# Patient Record
Sex: Male | Born: 1970 | Race: Black or African American | Hispanic: No | Marital: Single | Smoking: Former smoker
Health system: Southern US, Community
[De-identification: ages and names within clinical notes are randomized; demographics above are authoritative.]

## PROBLEM LIST (undated history)

## (undated) ENCOUNTER — Ambulatory Visit (HOSPITAL_COMMUNITY): Admission: EM | Source: Home / Self Care

## (undated) DIAGNOSIS — R7303 Prediabetes: Secondary | ICD-10-CM

## (undated) DIAGNOSIS — E7800 Pure hypercholesterolemia, unspecified: Secondary | ICD-10-CM

## (undated) DIAGNOSIS — I10 Essential (primary) hypertension: Secondary | ICD-10-CM

## (undated) DIAGNOSIS — E663 Overweight: Secondary | ICD-10-CM

## (undated) DIAGNOSIS — R0602 Shortness of breath: Secondary | ICD-10-CM

## (undated) DIAGNOSIS — F419 Anxiety disorder, unspecified: Secondary | ICD-10-CM

## (undated) DIAGNOSIS — M1990 Unspecified osteoarthritis, unspecified site: Secondary | ICD-10-CM

## (undated) DIAGNOSIS — D696 Thrombocytopenia, unspecified: Principal | ICD-10-CM

## (undated) DIAGNOSIS — G4730 Sleep apnea, unspecified: Secondary | ICD-10-CM

## (undated) DIAGNOSIS — Z87442 Personal history of urinary calculi: Secondary | ICD-10-CM

## (undated) DIAGNOSIS — Z9889 Other specified postprocedural states: Secondary | ICD-10-CM

## (undated) DIAGNOSIS — F32A Depression, unspecified: Secondary | ICD-10-CM

## (undated) DIAGNOSIS — R079 Chest pain, unspecified: Secondary | ICD-10-CM

## (undated) DIAGNOSIS — M5020 Other cervical disc displacement, unspecified cervical region: Secondary | ICD-10-CM

## (undated) DIAGNOSIS — R112 Nausea with vomiting, unspecified: Secondary | ICD-10-CM

## (undated) DIAGNOSIS — M549 Dorsalgia, unspecified: Secondary | ICD-10-CM

## (undated) HISTORY — DX: Other cervical disc displacement, unspecified cervical region: M50.20

## (undated) HISTORY — PX: KNEE ARTHROSCOPY: SUR90

## (undated) HISTORY — DX: Dorsalgia, unspecified: M54.9

## (undated) HISTORY — PX: ADENOIDECTOMY: SUR15

## (undated) HISTORY — PX: ANTERIOR FUSION CERVICAL SPINE: SUR626

## (undated) HISTORY — PX: HERNIA REPAIR: SHX51

## (undated) HISTORY — DX: Thrombocytopenia, unspecified: D69.6

## (undated) HISTORY — PX: APPENDECTOMY: SHX54

## (undated) HISTORY — DX: Chest pain, unspecified: R07.9

## (undated) HISTORY — DX: Anxiety disorder, unspecified: F41.9

## (undated) HISTORY — DX: Overweight: E66.3

## (undated) HISTORY — DX: Depression, unspecified: F32.A

## (undated) HISTORY — DX: Unspecified osteoarthritis, unspecified site: M19.90

## (undated) HISTORY — DX: Shortness of breath: R06.02

## (undated) HISTORY — PX: POSTERIOR FUSION CERVICAL SPINE: SUR628

## (undated) HISTORY — PX: IRRIGATION AND DEBRIDEMENT SEBACEOUS CYST: SHX5255

## (undated) HISTORY — PX: TONSILLECTOMY: SUR1361

## (undated) HISTORY — PX: LUMBAR FUSION: SHX111

## (undated) HISTORY — DX: Pure hypercholesterolemia, unspecified: E78.00

---

## 1998-04-05 ENCOUNTER — Emergency Department (HOSPITAL_COMMUNITY): Admission: EM | Admit: 1998-04-05 | Discharge: 1998-04-05 | Payer: Self-pay | Admitting: Emergency Medicine

## 1998-04-08 ENCOUNTER — Emergency Department (HOSPITAL_COMMUNITY): Admission: EM | Admit: 1998-04-08 | Discharge: 1998-04-08 | Payer: Self-pay | Admitting: Emergency Medicine

## 1999-06-09 ENCOUNTER — Emergency Department (HOSPITAL_COMMUNITY): Admission: EM | Admit: 1999-06-09 | Discharge: 1999-06-09 | Payer: Self-pay | Admitting: Emergency Medicine

## 1999-06-09 ENCOUNTER — Encounter: Payer: Self-pay | Admitting: Emergency Medicine

## 2000-06-28 ENCOUNTER — Emergency Department (HOSPITAL_COMMUNITY): Admission: EM | Admit: 2000-06-28 | Discharge: 2000-06-28 | Payer: Self-pay | Admitting: *Deleted

## 2003-03-31 ENCOUNTER — Emergency Department (HOSPITAL_COMMUNITY): Admission: EM | Admit: 2003-03-31 | Discharge: 2003-03-31 | Payer: Self-pay | Admitting: Emergency Medicine

## 2003-05-05 ENCOUNTER — Emergency Department (HOSPITAL_COMMUNITY): Admission: EM | Admit: 2003-05-05 | Discharge: 2003-05-05 | Payer: Self-pay | Admitting: Emergency Medicine

## 2003-08-10 ENCOUNTER — Emergency Department (HOSPITAL_COMMUNITY): Admission: AD | Admit: 2003-08-10 | Discharge: 2003-08-10 | Payer: Self-pay | Admitting: Family Medicine

## 2004-06-07 ENCOUNTER — Emergency Department (HOSPITAL_COMMUNITY): Admission: EM | Admit: 2004-06-07 | Discharge: 2004-06-07 | Payer: Self-pay | Admitting: Family Medicine

## 2004-06-14 ENCOUNTER — Emergency Department (HOSPITAL_COMMUNITY): Admission: EM | Admit: 2004-06-14 | Discharge: 2004-06-14 | Payer: Self-pay | Admitting: Family Medicine

## 2004-06-14 ENCOUNTER — Ambulatory Visit (HOSPITAL_COMMUNITY): Admission: RE | Admit: 2004-06-14 | Discharge: 2004-06-14 | Payer: Self-pay | Admitting: Family Medicine

## 2004-08-02 ENCOUNTER — Encounter (INDEPENDENT_AMBULATORY_CARE_PROVIDER_SITE_OTHER): Payer: Self-pay | Admitting: *Deleted

## 2004-08-02 ENCOUNTER — Ambulatory Visit (HOSPITAL_COMMUNITY): Admission: RE | Admit: 2004-08-02 | Discharge: 2004-08-02 | Payer: Self-pay | Admitting: Neurosurgery

## 2004-09-25 ENCOUNTER — Encounter: Admission: RE | Admit: 2004-09-25 | Discharge: 2004-09-25 | Payer: Self-pay | Admitting: Neurosurgery

## 2004-10-29 ENCOUNTER — Inpatient Hospital Stay (HOSPITAL_COMMUNITY): Admission: RE | Admit: 2004-10-29 | Discharge: 2004-11-01 | Payer: Self-pay | Admitting: Neurosurgery

## 2005-04-11 ENCOUNTER — Encounter: Admission: RE | Admit: 2005-04-11 | Discharge: 2005-04-11 | Payer: Self-pay | Admitting: Neurosurgery

## 2005-08-14 ENCOUNTER — Encounter: Admission: RE | Admit: 2005-08-14 | Discharge: 2005-08-14 | Payer: Self-pay | Admitting: Neurosurgery

## 2005-10-14 ENCOUNTER — Inpatient Hospital Stay (HOSPITAL_COMMUNITY): Admission: RE | Admit: 2005-10-14 | Discharge: 2005-10-19 | Payer: Self-pay | Admitting: Neurosurgery

## 2005-12-30 ENCOUNTER — Emergency Department (HOSPITAL_COMMUNITY): Admission: EM | Admit: 2005-12-30 | Discharge: 2005-12-30 | Payer: Self-pay | Admitting: Emergency Medicine

## 2006-02-04 ENCOUNTER — Ambulatory Visit (HOSPITAL_BASED_OUTPATIENT_CLINIC_OR_DEPARTMENT_OTHER): Admission: RE | Admit: 2006-02-04 | Discharge: 2006-02-04 | Payer: Self-pay | Admitting: Orthopedic Surgery

## 2006-11-09 ENCOUNTER — Emergency Department (HOSPITAL_COMMUNITY): Admission: EM | Admit: 2006-11-09 | Discharge: 2006-11-09 | Payer: Self-pay | Admitting: Family Medicine

## 2007-06-28 ENCOUNTER — Emergency Department (HOSPITAL_COMMUNITY): Admission: EM | Admit: 2007-06-28 | Discharge: 2007-06-28 | Payer: Self-pay | Admitting: Emergency Medicine

## 2008-12-15 ENCOUNTER — Emergency Department (HOSPITAL_COMMUNITY): Admission: EM | Admit: 2008-12-15 | Discharge: 2008-12-15 | Payer: Self-pay | Admitting: Emergency Medicine

## 2009-04-26 ENCOUNTER — Emergency Department (HOSPITAL_COMMUNITY): Admission: EM | Admit: 2009-04-26 | Discharge: 2009-04-26 | Payer: Self-pay | Admitting: Emergency Medicine

## 2010-01-07 ENCOUNTER — Emergency Department (HOSPITAL_COMMUNITY): Admission: EM | Admit: 2010-01-07 | Discharge: 2010-01-07 | Payer: Self-pay | Admitting: Family Medicine

## 2010-04-30 ENCOUNTER — Emergency Department (HOSPITAL_COMMUNITY): Admission: EM | Admit: 2010-04-30 | Discharge: 2010-04-30 | Payer: Self-pay | Admitting: Family Medicine

## 2010-10-23 ENCOUNTER — Inpatient Hospital Stay (INDEPENDENT_AMBULATORY_CARE_PROVIDER_SITE_OTHER)
Admission: RE | Admit: 2010-10-23 | Discharge: 2010-10-23 | Disposition: A | Discharge status: Home / Self Care | Payer: BC Managed Care – PPO | Source: Ambulatory Visit | Attending: Emergency Medicine | Admitting: Emergency Medicine

## 2010-10-23 DIAGNOSIS — L03211 Cellulitis of face: Secondary | ICD-10-CM

## 2010-10-23 DIAGNOSIS — L0201 Cutaneous abscess of face: Secondary | ICD-10-CM

## 2011-01-03 NOTE — Discharge Summary (Signed)
NAME:  Ricky [name], Ricky [name] NO.:  [id number]   MEDICAL RECORD NO.:  [id number]          PATIENT TYPE:  INP   LOCATION:  3028                         FACILITY:  [facility]   PHYSICIAN:  Payton Doughty, M.D.      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   OPERATION:  C4-5, C5-6, C6-7 anterior cervical decompression and fusion of  the reflex hybrid plate.   SERVICE:  Neurosurgery.   COMPLICATIONS:  None.   DISCHARGE STATUS:  Alive and well.   BODY OF TEXT:  A 40 year old gentleman, his history and physical is  recounted in the chart. 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 Medical history is otherwise benign.  He was admitted after ascertaining normal laboratory values and underwent  fusion at 4-5, 5-6 and 6-7.  Postoperatively he did well. Drain was placed,  he kept the drain for two days, drain diminished to less than 5 mL a shift  and it was removed. Foley was removed in the recovery room. He has had some  mild dysphagia. His strength is full, his incision is dry and well healing,  he is afebrile and his vital signs are stable. He is neurologically intact  with some right shoulder and arm pain. Currently his dysphagia is better, he  is being discharged home in the care of his family with Percocet for pain,  Flexeril for spasm. His followup with be in the [facility] office in about two weeks.      MWR/MEDQ  D:  11/01/2004  T:  11/01/2004  Job:  [id number]

## 2011-01-03 NOTE — H&P (Signed)
NAME:  Ricky [name], TAMBURO NO.:  [id number]   MEDICAL RECORD NO.:  [id number]          PATIENT TYPE:  INP   LOCATION:  2899                         FACILITY:  [facility]   PHYSICIAN:  Payton Doughty, M.D.      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   HISTORY:  This is a 40 year old, right-handed white gentleman whom I have  been following since October.  He had a sebaceous cyst on his scalp that was  taken care of.  He has been complaining of neck and bilateral arm pain.  An  MR is obtained that demonstrates a herniated disk at C5-6 and at C4-5, and  significant spondylosis at C6-7, with significant cord compression.  He is  admitted for an anterior cervical decompression and fusion.   PAST MEDICAL HISTORY:  Unremarkable.   MEDICATIONS:  Lotensin.   ALLERGIES:  No known drug allergies.   FAMILY HISTORY:  Mom is age 61.  Dad is age 31.  They have hypertension.  No  other diseases.   SOCIAL HISTORY:  He does not smoke and does not drink.  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   REVIEW OF SYSTEMS:  Remarkable for glasses.  Hypertension and neck pain.   PHYSICAL EXAMINATION:  HEENT:  Within normal limits.  NECK:  He has a somewhat limited range of motion of his neck.  CHEST:  Clear.  HEART:  A regular rate and rhythm.  ABDOMEN:  Nontender, with no hepatosplenomegaly.  EXTREMITIES:  Without clubbing or cyanosis.  GENITOURINARY:  Exam is deferred.  PULSES:  Peripheral pulses are good.  NEUROLOGIC:  He is awake, alert and oriented.  His cranial nerves are  intact.  Motor exam demonstrates 5/5 strength throughout the upper and lower  extremities.  Sensory deficit described at the tip of the fingers, worse on  the right hand than on the left, basically in a C6 distribution.  Reflexes  are 1 at the  biceps, 2 at the triceps, 2 at the brachial radialis.  He has a  mildly positive Hoffman's bilaterally.  The lower extremities are non-  myelopathic.   An MR demonstrates a disk at C4-5 extending up to C3-4, anterior compression  of the cord at C5-6.  There is a large disk with left and right foraminal  stenosis, narrowing of the canal and a focus of ischemia behind C5-6. At C6-  7 there is a large left paracentral lateral recess disk herniation and  bilateral foraminal stenosis.   CLINICAL IMPRESSION:  Severe cervical stenosis and herniated disk at C4-5,  C5-6 and C6-7 with early myelopathy.   PLAN:  For an anterior cervical decompression and fusion at C4-5, C5-6 and  C6-7.  The risks and benefits of this approach have been discussed with him,  and he wishes to proceed.      MWR/MEDQ  D:  10/29/2004  T:  10/29/2004  Job:  [id number]

## 2011-01-03 NOTE — Op Note (Signed)
NAME:  Ricky [name], SHANKEL NO.:  [id number]   MEDICAL RECORD NO.:  [id number]          PATIENT TYPE:  AMB   LOCATION:  DSC                          FACILITY:  [facility]   PHYSICIAN:  Loreta Ave, M.D. DATE OF BIRTH:  [birth year]   DATE OF PROCEDURE:  02/04/2006  DATE OF DISCHARGE:                                 OPERATIVE REPORT   PREOPERATIVE DIAGNOSES:  1.  Left knee, medial meniscus tear.  2.  Chondromalacia patella.   POSTOPERATIVE DIAGNOSES:  1.  Left knee, medial meniscus tear.  2.  Flap tear, lateral meniscus.  3.  Chondromalacia patella.  4.  Grade 2 medial plica.   OPERATION/PROCEDURE:  1.  Left knee examination under anesthesia.  2.  Arthroscopy.  3.  Partial medial and lateral meniscectomy.  4.  Chondroplasty patella.  5.  Excised medial plica.   SURGEON:  Loreta Ave, M.D.   ASSISTANT:  Genene Churn. Barry Dienes, P.A.-C.   ANESTHESIA:  General.   ESTIMATED BLOOD LOSS:  Minimal.   TOURNIQUET TIME:  Not employed.   SPECIMENS:  None.   CULTURES:  None.   COMPLICATIONS:  None.   DRESSING:  Soft compressive.   DESCRIPTION OF PROCEDURE:  The patient was brought to the operating room and  placed on the operating table in the supine position.  After adequate  anesthesia had been obtained, left knee was examined.  Good motion. Good  stability. Negative Lachman's, negative drawer. Good patellofemoral  tracking.  Tourniquet and leg holder applied.  Leg prepped and draped in the  usual sterile fashion.  Three portals created, one superolateral, one each  medial and lateral parapatellar.  Inflow catheter introduced medially.  Standard arthroscope introduced and knee inspected.  Good patellofemoral  tracking.  Some grade 2, mild grade 3 changes lateral patella facet  superior.  Trochlea looked good.  Superficial chondroplasty with shaver.  Large fibrotic medial plica extending over the condyle with some mild  abrasion on the medial femoral condyle  below.  Plica excised.  Hemostasis  with cautery.  Medial meniscus complete posterior horn detachment with  intrameniscal tearing over the posterior third.  Nothing reparable.  Posterior third excised, tapered with remaining meniscus.  Articular  cartilage looked good.  Cruciate ligament is intact.  Laterally grade 2  changes on the plateau have flap tears in the anterior middle third of the  lateral meniscus.  Saucerized out containing a fair amount of meniscus at  completion.  Superficial chondroplasty on the plateau.  At completion, the  entire knee examined.  No other findings appreciated.  Instruments, fluid  removed.  Portals were injected with Marcaine.  Portals closed with 4-0  nylon.  Sterile compressive dressing applied.  Anesthesia reversed.  Brought  to the recovery room.  Tolerated the surgery well without complications.      Loreta Ave, M.D.  Electronically Signed     DFM/MEDQ  D:  02/04/2006  T:  02/04/2006  Job:  [id number]

## 2011-01-03 NOTE — Op Note (Signed)
NAME:  Ricky [name], Ricky [name] NO.:  [id number]   MEDICAL RECORD NO.:  [id number]          PATIENT TYPE:  OIB   LOCATION:  2899                         FACILITY:  [facility]   PHYSICIAN:  Payton Doughty, M.D.      DATE OF BIRTH:  21-Jan-1971   DATE OF PROCEDURE:  08/02/2004  DATE OF DISCHARGE:                                 OPERATIVE REPORT   SURGEON:  Payton Doughty, M.D.   ANESTHESIA:  General endotracheal.   PREPARATION:  Sterile Betadine prep and scrub with alcohol wipe.   COMPLICATIONS:  None.   BODY OF TEXT:  This is a 40 year old gentleman who had a sebaceous cyst  excised at an outside clinic that became infected.  It is now involuted  after antibiotic therapy and is ready for excision.  Taken to the operating  room and smoothly anesthetized with an LMA and 1% local lidocaine block with  epinephrine was done and an elliptical incision was made around the pore of  the cyst.  This was resected.  There was a small remnant that tracked  posterolaterally.  This was followed and excised in toto.  There was some  brisk bleeding encountered from the galea.  It was controlled with the  bipolar and Bovie cautery.  The whole affair was closed as a single layer  with an interrupted vertical mattress stitch of 3-0 nylon.  Betadine and  Telfa dressing was applied and made occlusive with OpSite and the patient  returned to the recovery room.       MWR/MEDQ  D:  08/02/2004  T:  08/02/2004  Job:  [id number]

## 2011-01-03 NOTE — Op Note (Signed)
NAME:  EDDER, BELLANCA NO.:  [id number]   MEDICAL RECORD NO.:  [id number]          PATIENT TYPE:  INP   LOCATION:  2899                         FACILITY:  [facility]   PHYSICIAN:  Payton Doughty, M.D.      DATE OF BIRTH:  [birth year]   DATE OF PROCEDURE:  10/29/2004  DATE OF DISCHARGE:                                 OPERATIVE REPORT   PREOPERATIVE DIAGNOSIS:  Spondylosis at C4-5, C5-6 and C6-7.   POSTOPERATIVE DIAGNOSIS:  Spondylosis at C4-5, C5-6 and C6-7.   OPERATION:  C4-5, C5-6 and C6-7 anterior cervical decompression and fusion  with reflex fiber plate.   SURGEON:  Payton Doughty, M.D.   ASSISTANT:  Hewitt Shorts, M.D.   NURSE ASSISTANT:  [facility].   ANESTHESIA:  General endotracheal.   PREPARATION:  Prepped and scrubbed with alcohol wipe.   COMPLICATIONS:  None.   INDICATIONS FOR PROCEDURE:  This is a 40 year old gentleman with severe  cervical spondylosis and herniated disk and cord compression.  He was taken  to the operating room and smoothly anesthetized and intubated.  Placed  supine on the operating room table in the Holter head traction.  Following  shave, prep and drape in the usual sterile fashion, the skin was incised  along the sternocleidomastoid muscle, starting four finger breadths below  the carotid tubercle, extending to three finger breadths above the carotid  tubercle.  The platysma was identified, elevated, divided and undermined.  The sternocleidomastoid was then identified and a medial dissection revealed  the carotid artery were retracted laterally to the left.  The trachea and  esophagus were retracted laterally to the right, exposing the bones of the  anterior cervical spine.  A marker was placed.  An intraoperative x-ray was  obtained to confirm correctness of the level.  Having confirmed the  correctness of the level, the longus coli was taken down bilaterally.  The  ShadowLine retractor placed transversely in a cephalocaudal  direction.  A  diskectomy was carried out at C4-5, C5-6 and C6-7 under gross observation.  The operating microscope was then brought in, and we used microdissection  technique to remove the remaining disk and divide the posterior longitudinal  ligament, explored the neural foramina bilaterally.  At C3-4 there was a  central disk which extended behind the posterior longitudinal ligament in a  cephalic direction.  At C4-5 there was significant spondylosis with  bilateral disk, worse on the right than on the left.  Compression of the  right C6 root, and at C6-7 there was bilateral disk actually worse off to  the left with compression of the left C7 root.  Following a complete  decompression, the wound was irrigated and hemostasis assured.  The 7 mm  bone rasp fashioned the patellar allograft and tapped into place.  A 57 mm  plate was then placed with 12 mm screws, two in C4, two in C5, two in C6 and  two in C7.  Intraoperative x-ray showed good placement of bone grafts,  plate, and screws.  The platysma and subcutaneous tissue were reapproximated  with #  3-0 Vicryl in an  interrupted fashion.  The skin was closed with #4-0 Vicryl in a running  subcuticular fashion.  A Jackson-Pratt drain was placed in the anterior  cervical space and exited by a separate incision.  The patient was placed in  an Aspen collar and returned to the recovery room in good condition.      MWR/MEDQ  D:  10/29/2004  T:  10/29/2004  Job:  [id number]

## 2011-01-03 NOTE — H&P (Signed)
NAME:  Ricky [name], PERKO NO.:  [id number]   MEDICAL RECORD NO.:  [id number]          PATIENT TYPE:  INP   LOCATION:  2899                         FACILITY:  [facility]   PHYSICIAN:  Payton Doughty, M.D.      DATE OF BIRTH:  [birth year]   DATE OF ADMISSION:  10/14/2005  DATE OF DISCHARGE:                                HISTORY & PHYSICAL   ADMISSION DIAGNOSES:  Spondylolisthesis, and spondylosis of L5 on  transitional vertebra.   HISTORY:  This is a now 40 year old, right-handed black gentleman whom I  have known for about 1-1/2 years.  He presented with abscessed sebaceous  cysts on his head.  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  He also has pain in his back for some time, and we  obtained plain films and an MR of his lumbar spine.  He has spondylosis of  L5 and a grade 1 spondylolisthesis of L5 on S1.  S1 is a transitional  vertebra.  He has back and right lower extremity pain which is worsening,  and he is now admitted for decompression and fusion.   PAST MEDICAL HISTORY:  Otherwise unremarkable.   MEDICATIONS:  Lotensin for hypertension.   ALLERGIES:  No known drug allergies.   FAMILY HISTORY:  Mom  is age 68.  Dad is age 37.  They both have  hypertension.   SOCIAL HISTORY:  He does not smoke.  Does not drink.  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   REVIEW OF SYSTEMS:  Remarkable for glasses, hypertension, back pain and pain  in his head.   PHYSICAL EXAMINATION:  HEENT:  Within normal limits with a well-healed  incision.  NECK:  Supple with no lymphadenopathy.  Some limitation of range of motion,  with a well-healed incision.  CHEST:  Clear.  HEART:  Regular rate and rhythm.  ABDOMEN:  Nontender, no hepatosplenomegaly.  EXTREMITIES:  Without clubbing or cyanosis.  GENITOURINARY:  Deferred.  EXTREMITIES:  Peripheral pulses are good.  NEUROLOGIC:  He is awake, alert and oriented.  His cranial nerves are  intact.  Motor exam shows 5/5 strength throughout the upper extremities with  slightly facilitated reflexes and a positive Hoffman's.  Lower extremity  strength is full, save for the dorsiflexors on the right which are 4/5, with  a right L5 sensory deficit.  Reflexes are 3 at the knees, 3 at the left  ankle, a flicker at the right.  Straight leg raise is positive on the right.   MR and plain films have been reviewed above.   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   PLAN:  For a Gill procedure of L5 and a fusion at L5-S1.  Pedicle screws  will be used as needed if reduction cannot be achieved.  Will also infuse  with the extender matrix.  This has been discussed with the patient, and he  wishes to proceed.  ______________________________  Payton Doughty, M.D.     MWR/MEDQ  D:  10/14/2005  T:  10/14/2005  Job:  [id number]

## 2011-01-03 NOTE — Discharge Summary (Signed)
NAMEIZAIA, SAY NO.:  [id number]   MEDICAL RECORD NO.:  [id number]          PATIENT TYPE:  INP   LOCATION:  3014                         FACILITY:  [facility]   PHYSICIAN:  Coletta Memos, M.D.     DATE OF BIRTH:  1970/10/10   DATE OF ADMISSION:  10/14/2005  DATE OF DISCHARGE:  10/19/2005                                 DISCHARGE SUMMARY   ADMITTING DIAGNOSIS:  Spondylolysis, lumbar-5.   DISCHARGE DIAGNOSIS:  Spondylolysis, lumbar-5.   PROCEDURE:  Gill procedure, L5 and pedicle screw fixation, L5-S1,  posterolateral arthrodesis, L5-S1.   COMPLICATIONS:  None.   DISCHARGE STATUS:  Alive and well.   INDICATIONS:  Baldomero Mirarchi is a young man who presented with severe back  pain and bilateral lower extremity pain right greater than left. He had a  spondylolysis of L5 and a grade 2 slip of L5 on S1.   He was admitted and taken to the operating room on October 14, 2005. He had  an uncomplicated procedure and postoperatively he has done remarkably well.  He had 5/5 strength in the lower extremities. The wound was clean, dry and  without signs of infection or discharge.   He was given Percocet and Flexeril.   He will see Dr. Channing Mutters in approximately 1 week for suture removal.           ______________________________  Coletta Memos, M.D.     KC/MEDQ  D:  10/19/2005  T:  10/20/2005  Job:  [id number]

## 2011-01-03 NOTE — Op Note (Signed)
NAMEGORMAN, SAFI NO.:  [id number]   MEDICAL RECORD NO.:  [id number]          PATIENT TYPE:  INP   LOCATION:  2899                         FACILITY:  [facility]   PHYSICIAN:  Payton Doughty, M.D.      DATE OF BIRTH:  [birth year]   DATE OF PROCEDURE:  10/14/2005  DATE OF DISCHARGE:                                 OPERATIVE REPORT   PREOPERATIVE DIAGNOSIS:  Spondylolysis of lumbar 5.   POSTOPERATIVE DIAGNOSIS:  Spondylolysis of lumbar 5.   OPERATION PERFORMED:  Lumbar 5 Gill procedure with lumbar 5 to sacral 1  nonsegmental pedicle screw fixation and posterolateral arthrodesis.   SURGEON:  Payton Doughty, M.D.   ASSISTANT:  Cristi Loron, M.D.   ANESTHESIA:  General endotracheal.   PREPARATION:  The patient was sterilely prepped with alcohol wipes.   COMPLICATIONS:  None.   INDICATIONS FOR THE PROCEDURE:  This is a 40 year old right-handed black  gentleman with spondylolysis, severe low-back pain and right L5  radiculopathy.   DESCRIPTION OF THE OPERATION:  The patient was brought to the operating  room, anesthetized and intubated, and placed prone on the operating table.  The patient was shaved, prepped and draped in the usual sterile fashion.  The skin was the infiltrated with 1% lidocaine with 1:400,000 epinephrine.   The skin was incised from the mid L4 to the mid S1.  Then the lamina of L5  and S1 were exposed bilaterally in the subperiosteal plane as well as the  transverse process and sacral ala.  Intraoperative x-ray confirmed we were  at the correct level.  The lamina was divided off the base of the spinous  processes bilaterally; and, the inferior facet and the remaining pars intra-  articularis were removed bilaterally of L5.  This allowed decompression of  the 5 foot, once the ligamentum flavum and redundant tissue associated with  a spondylytic defect was removed.  Both 5 roots were thus uncovered.  The  left was significantly more in cumbered  than the right.   Following complete decompression it was obviously impossible to get into the  L5, S1  disk space because of the course of the fibers.  Pedicle screws were  therefore placed in L5 and S1.  Intraoperative x-ray showed good placement  of the pedicle screws.  They were connected with a radius bar and secured.  The tranverse process and sacral ala were decorticated and bone morphogenic  protein was infused with the extender Matrix placed over them.   Intraoperative x-rays showed good placement of the pedicle screws and bon  graft.   The entire area was irrigated.  Hemostasis was assured.  The wound was  closed in successive layers of 0-Vicryl, 2-0 Vicryl and 3-0 nylon.  Betadine  Telfa dressing was applied and made occlusive with OpSite.   The patient was taken to the recovery room in good condition.           ______________________________  Payton Doughty, M.D.     MWR/MEDQ  D:  10/14/2005  T:  10/15/2005  Job:  [id number]

## 2011-03-05 ENCOUNTER — Other Ambulatory Visit: Payer: Self-pay | Admitting: Internal Medicine

## 2011-03-05 DIAGNOSIS — R1031 Right lower quadrant pain: Secondary | ICD-10-CM

## 2011-03-12 ENCOUNTER — Ambulatory Visit
Admission: RE | Admit: 2011-03-12 | Discharge: 2011-03-12 | Disposition: A | Discharge status: Home / Self Care | Payer: BC Managed Care – PPO | Source: Ambulatory Visit | Attending: Internal Medicine | Admitting: Internal Medicine

## 2011-03-12 DIAGNOSIS — R1031 Right lower quadrant pain: Secondary | ICD-10-CM

## 2011-08-04 ENCOUNTER — Other Ambulatory Visit: Payer: Self-pay | Admitting: Orthopedic Surgery

## 2011-08-04 DIAGNOSIS — M542 Cervicalgia: Secondary | ICD-10-CM

## 2011-08-04 DIAGNOSIS — M5410 Radiculopathy, site unspecified: Secondary | ICD-10-CM

## 2011-08-06 ENCOUNTER — Ambulatory Visit
Admission: RE | Admit: 2011-08-06 | Discharge: 2011-08-06 | Disposition: A | Discharge status: Home / Self Care | Payer: BC Managed Care – PPO | Source: Ambulatory Visit | Attending: Orthopedic Surgery | Admitting: Orthopedic Surgery

## 2011-08-06 DIAGNOSIS — M5410 Radiculopathy, site unspecified: Secondary | ICD-10-CM

## 2011-08-06 DIAGNOSIS — M542 Cervicalgia: Secondary | ICD-10-CM

## 2011-12-08 ENCOUNTER — Emergency Department (HOSPITAL_COMMUNITY)
Admission: EM | Admit: 2011-12-08 | Discharge: 2011-12-08 | Disposition: A | Discharge status: Home / Self Care | Payer: BC Managed Care – PPO | Source: Home / Self Care | Attending: Family Medicine | Admitting: Family Medicine

## 2011-12-08 ENCOUNTER — Encounter (HOSPITAL_COMMUNITY): Payer: Self-pay | Admitting: *Deleted

## 2011-12-08 DIAGNOSIS — L0201 Cutaneous abscess of face: Secondary | ICD-10-CM

## 2011-12-08 DIAGNOSIS — L03211 Cellulitis of face: Secondary | ICD-10-CM

## 2011-12-08 HISTORY — DX: Essential (primary) hypertension: I10

## 2011-12-08 MED ORDER — DOXYCYCLINE HYCLATE 100 MG PO CAPS: 100.0000 mg | ORAL_CAPSULE | Freq: Two times a day (BID) | ORAL | Status: AC

## 2011-12-08 NOTE — ED Notes (Signed)
C/O abscess to right lower cheek/jaw area x approx 5 days.  Has been applying warm compresses.

## 2011-12-08 NOTE — ED Provider Notes (Signed)
History     CSN: [id number]  Arrival date & time 12/08/11  3086   First MD Initiated Contact with Patient 12/08/11 1831      Chief Complaint  Patient presents with  . Abscess    (Consider location/radiation/quality/duration/timing/severity/associated sxs/prior treatment) Patient is a 41 y.o. male presenting with abscess. The history is provided by the patient.  Abscess  This is a new problem. The current episode started less than one week ago. The onset was gradual. The problem has been gradually worsening. The abscess is present on the face. The problem is mild. The abscess is characterized by painfulness and swelling. The abscess first occurred at home.    Past Medical History  Diagnosis Date  . Hypertension     Past Surgical History  Procedure Date  . Appendectomy   . Hernia repair   . Tonsillectomy   . Adenoidectomy   . Anterior fusion cervical spine   . Posterior fusion cervical spine   . Lumbar fusion   . Knee arthroscopy     x2  . Irrigation and debridement sebaceous cyst     on scalp    No family history on file.  History  Substance Use Topics  . Smoking status: Never Smoker   . Smokeless tobacco: Not on file  . Alcohol Use: No      Review of Systems  Constitutional: Negative.   Skin: Positive for rash.    Allergies  Review of patient's allergies indicates no known allergies.  Home Medications   Current Outpatient Rx  Name Route Sig Dispense Refill  . AZOR PO Oral Take by mouth.    . DOXYCYCLINE HYCLATE 100 MG PO CAPS Oral Take 1 capsule (100 mg total) by mouth 2 (two) times daily. 20 capsule 0    BP 149/103  Pulse 66  Temp(Src) 98.9 F (37.2 C) (Oral)  Resp 20  SpO2 99%  Physical Exam  Nursing note and vitals reviewed. Constitutional: He appears well-developed and well-nourished.  HENT:  Head: Normocephalic.  Skin: Skin is warm and dry. There is erythema.       Tender cystic sts to right mandible area of face.    ED Course    INCISION AND DRAINAGE Date/Time: 12/08/2011 7:31 PM Performed by: Linna Hoff Authorized by: Bradd Canary D Consent: Verbal consent obtained. Consent given by: patient Patient understanding: patient states understanding of the procedure being performed Type: abscess Body area: head/neck Location details: face Local anesthetic: topical anesthetic Patient sedated: no Scalpel size: 11 Incision type: single straight Complexity: simple Drainage: purulent Drainage amount: moderate Wound treatment: wound left open Patient tolerance: Patient tolerated the procedure well with no immediate complications. Comments: Culture obtained, dsd.   (including critical care time)   Labs Reviewed  WOUND CULTURE   No results found.   1. Abscess of face       MDM  I+d performed       Linna Hoff, MD 12/08/11 [phone]

## 2011-12-08 NOTE — Discharge Instructions (Signed)
Warm compress twice a day when you take the antibiotic, take all of medicine, return as needed. °

## 2011-12-11 LAB — CULTURE, ROUTINE-ABSCESS

## 2011-12-12 NOTE — ED Notes (Signed)
Wound culture positive for Moderate Haemophilus Parainfluenzae.  Adequately treated with Doxycycline per Dr. Artis Flock.

## 2012-02-10 ENCOUNTER — Other Ambulatory Visit: Payer: Self-pay | Admitting: Neurosurgery

## 2012-02-10 DIAGNOSIS — M5020 Other cervical disc displacement, unspecified cervical region: Secondary | ICD-10-CM

## 2012-02-12 ENCOUNTER — Ambulatory Visit
Admission: RE | Admit: 2012-02-12 | Discharge: 2012-02-12 | Disposition: A | Discharge status: Home / Self Care | Payer: BC Managed Care – PPO | Source: Ambulatory Visit | Attending: Neurosurgery | Admitting: Neurosurgery

## 2012-02-12 DIAGNOSIS — M5020 Other cervical disc displacement, unspecified cervical region: Secondary | ICD-10-CM

## 2012-06-09 ENCOUNTER — Other Ambulatory Visit: Payer: Self-pay | Admitting: Neurosurgery

## 2012-06-09 ENCOUNTER — Ambulatory Visit
Admission: RE | Admit: 2012-06-09 | Discharge: 2012-06-09 | Disposition: A | Discharge status: Home / Self Care | Payer: BC Managed Care – PPO | Source: Ambulatory Visit | Attending: Neurosurgery | Admitting: Neurosurgery

## 2012-06-09 DIAGNOSIS — M5020 Other cervical disc displacement, unspecified cervical region: Secondary | ICD-10-CM

## 2012-07-05 ENCOUNTER — Telehealth: Payer: Self-pay | Admitting: Oncology

## 2012-07-05 NOTE — Telephone Encounter (Signed)
Welcome packet mailed.

## 2012-07-05 NOTE — Telephone Encounter (Signed)
S/W pt in re NP appt 12/18 @ 9:30 w/Dr. Cyndie Chime.  Referring Dr. 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

## 2012-07-06 ENCOUNTER — Telehealth: Payer: Self-pay | Admitting: Oncology

## 2012-07-06 NOTE — Telephone Encounter (Signed)
C/D 111/19/13 for appt.08/04/12

## 2012-07-07 ENCOUNTER — Encounter: Payer: Self-pay | Admitting: Oncology

## 2012-07-07 ENCOUNTER — Telehealth: Payer: Self-pay | Admitting: Oncology

## 2012-07-07 ENCOUNTER — Other Ambulatory Visit: Payer: Self-pay | Admitting: Oncology

## 2012-07-07 DIAGNOSIS — D696 Thrombocytopenia, unspecified: Secondary | ICD-10-CM

## 2012-07-07 DIAGNOSIS — M5020 Other cervical disc displacement, unspecified cervical region: Secondary | ICD-10-CM | POA: Insufficient documentation

## 2012-07-07 HISTORY — DX: Thrombocytopenia, unspecified: D69.6

## 2012-07-07 HISTORY — DX: Other cervical disc displacement, unspecified cervical region: M50.20

## 2012-07-07 NOTE — Telephone Encounter (Signed)
Talked to patient and he is aware of appt , draw labs first then see MD after a few days on 12/18th

## 2012-07-30 ENCOUNTER — Other Ambulatory Visit (HOSPITAL_BASED_OUTPATIENT_CLINIC_OR_DEPARTMENT_OTHER): Payer: BC Managed Care – PPO | Admitting: Lab

## 2012-07-30 ENCOUNTER — Ambulatory Visit: Payer: BC Managed Care – PPO

## 2012-07-30 DIAGNOSIS — D696 Thrombocytopenia, unspecified: Secondary | ICD-10-CM

## 2012-07-30 LAB — APTT: aPTT: 30.7 seconds (ref 24–37)

## 2012-07-30 LAB — MORPHOLOGY: PLT EST: DECREASED

## 2012-07-30 LAB — CBC & DIFF AND RETIC
BASO%: 0.2 % (ref 0.0–2.0)
Basophils Absolute: 0 10*3/uL (ref 0.0–0.1)
EOS%: 1.6 % (ref 0.0–7.0)
Eosinophils Absolute: 0.1 10*3/uL (ref 0.0–0.5)
HCT: 46.5 % (ref 38.4–49.9)
HGB: 15.4 g/dL (ref 13.0–17.1)
Immature Retic Fract: 6.7 % (ref 3.00–10.60)
LYMPH%: 34.3 % (ref 14.0–49.0)
MCH: 29.7 pg (ref 27.2–33.4)
MCHC: 33.1 g/dL (ref 32.0–36.0)
MCV: 89.8 fL (ref 79.3–98.0)
MONO#: 0.5 10*3/uL (ref 0.1–0.9)
MONO%: 8.7 % (ref 0.0–14.0)
NEUT#: 3.2 10*3/uL (ref 1.5–6.5)
NEUT%: 55.2 % (ref 39.0–75.0)
Platelets: 103 10*3/uL — ABNORMAL LOW (ref 140–400)
RBC: 5.18 10*6/uL (ref 4.20–5.82)
RDW: 14.1 % (ref 11.0–14.6)
Retic %: 1.28 % (ref 0.80–1.80)
Retic Ct Abs: 66.3 10*3/uL (ref 34.80–93.90)
WBC: 5.7 10*3/uL (ref 4.0–10.3)
lymph#: 2 10*3/uL (ref 0.9–3.3)

## 2012-07-30 LAB — HIV ANTIBODY (ROUTINE TESTING W REFLEX): HIV: NONREACTIVE

## 2012-07-30 LAB — CHCC SMEAR

## 2012-07-30 LAB — HEPATITIS C ANTIBODY: HCV Ab: NEGATIVE

## 2012-08-04 ENCOUNTER — Ambulatory Visit (HOSPITAL_BASED_OUTPATIENT_CLINIC_OR_DEPARTMENT_OTHER): Payer: BC Managed Care – PPO | Admitting: Oncology

## 2012-08-04 ENCOUNTER — Other Ambulatory Visit: Payer: BC Managed Care – PPO | Admitting: Lab

## 2012-08-04 ENCOUNTER — Ambulatory Visit: Payer: BC Managed Care – PPO

## 2012-08-04 VITALS — BP 156/102 | HR 72 | Temp 98.7°F | Resp 20 | Ht 74.0 in | Wt 280.3 lb

## 2012-08-04 DIAGNOSIS — D696 Thrombocytopenia, unspecified: Secondary | ICD-10-CM

## 2012-08-04 NOTE — Progress Notes (Signed)
New Patient Hematology-Oncology Evaluation   Ricky [name] [id number] 1971-01-29 41 y.o. 08/04/2012  CC: Dr. Trey Sailors   Reason for referral: Borderline thrombocytopenia  Pleasant 41 year old African American man referred for mild, chronic, thrombocytopenia. A red flag was raised by a laboratory technician who read a peripheral blood film as showing circulating megakaryocytes. Ricky [name] has been in overall excellent health. He actually saw my partner Dr.Ennever in April of 2005 for the same reason. Platelet counts fluctuating between 96,001 123,000 at that time. He had a normal exam. He had no known risk factors and no offending medications. No family history of bleeding disorders although he states his father was told at one point that he had low platelets. Details not available. Dr. Myna Hidalgo also checked  a PTT which was normal.(Rule out von Willebrand's). In anticipation of his visit today, I checked hepatitis C and HIV and both are negative. 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 He has had multiple surgeries beginning with a C2-C7 laminectomy and fusion in 2007, a L5-S1 laminectomy the same year, a revisional surgery at C3-C4 in November of 2013. Another revisional surgery on his cervical spine is planned for the new year 2014. He has had a prior appendectomy, excision of a sebaceous cyst from his left scalp, tonsillectomy, left knee surgery, and at no time has he had excessive bleeding after the surgical procedures. He has no signs or symptoms of a collagen vascular disorder. ANA done prior to this visit is negative. He has no HIV risk factors and specifically denies IV drug use. He does not believe that he has ever had blood transfusions with any of his surgeries. His only medication is an antihypertensive Tribenzor which is a combination of hydrochlorothiazide, Norvasc, and olmersartin. He has no known allergies. His father has muscular dystrophy still alive at age 76.  Maternal grandmother also with muscular dystrophy. Father was told he had low platelets though details available. He has a brother and sister who are healthy except for high blood pressure. He has 2 sons age 74 and 110 who are healthy.    PMH: There is no history of hepatitis, yellow jaundice, malaria, or mononucleosis. Past Medical History  Diagnosis Date  . Hypertension   . Thrombocytopenia 07/07/2012    138,000 08/26/11; 98,000 06/29/12  . 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 Appendectomy   . Hernia repair   . Tonsillectomy   . Adenoidectomy   . Anterior fusion cervical spine   . Posterior fusion cervical spine   . Lumbar fusion   . Knee arthroscopy     x2  . 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 He stopped smoking a number of years ago..   he does not drink alcohol or use illicit drugs.  Family History: See history of present illness   Review of Systems: Constitutional symptoms: No constitutional symptoms HEENT: No sore throat Respiratory: No cough or dyspnea Cardiovascular:  No chest pain or palpitations Gastrointestinal ROS: No change in bowel habit Genito-Urinary ROS: No urinary tract symptoms Hematological and Lymphatic: Musculoskeletal: Recurrent neck pain Neurologic: Intermittent paresthesias of his hands from previous spinal surgery Dermatologic: No rash Remaining ROS negative.  Physical Exam: Blood pressure 156/102, pulse 72, temperature 98.7 F (37.1 C),  temperature source Oral, resp. rate 20, height 6\' 2"  (1.88 m), weight 280 lb 4.8 oz (127.143 kg). Wt Readings from Last 3 Encounters:  08/04/12 280 lb 4.8 oz (127.143 kg)    General appearance: Well-nourished African American man Head: Normal Neck: Normal Lymph nodes: No  adenopathy Breasts: Lungs: Clear to auscultation resonant to percussion Heart: Regular rhythm no murmur Abdominal: Soft, nontender, no mass, no organomegaly GU: Not examined Extremities: No edema, no calf tenderness Neurologic: Alert and oriented, PERRLA, optic disc sharp, vessels normal, motor strength 5 over 5, reflexes 1+ symmetric Skin: No rash, no ecchymosis    Lab Results: Lab Results  Component Value Date   WBC 5.7 07/30/2012   HGB 15.4 07/30/2012   HCT 46.5 07/30/2012   MCV 89.8 07/30/2012   PLT 103* 07/30/2012     Chemistry   No results found for this basename: NA, K, CL, CO2, BUN, CREATININE, GLU   No results found for this basename: CALCIUM, ALKPHOS, AST, ALT, BILITOT       Review of peripheral blood film: Normochromic normocytic red cells. mature white cells. Subpopulation of benign, reactive, large granular, lymphocytes. Platelets appear normal in number and morphology. There is a subpopulation of large platelets which is a normal finding.     Impression and Plan: Low normal platelet count Large platelets on peripheral blood film will be read as lymphocytes or red cells and spurious we lowered the machine count. Worldwide, a platelet count of 100,000 or above is considered normal and we are reevaluating what we call normal in the [place]. 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 He has had multiple surgical procedures without any bleeding  Recommendation: Proceed with surgery as planned. No further hematologic followup is necessary. Patient reassured.      Levert Feinstein, MD 08/04/2012, 11:29 AM

## 2012-08-04 NOTE — Patient Instructions (Signed)
Merry Christmas

## 2013-06-16 ENCOUNTER — Other Ambulatory Visit: Payer: Self-pay | Admitting: Dermatology

## 2014-02-24 ENCOUNTER — Other Ambulatory Visit: Payer: Self-pay | Admitting: Neurosurgery

## 2014-02-24 DIAGNOSIS — S4991XA Unspecified injury of right shoulder and upper arm, initial encounter: Secondary | ICD-10-CM

## 2014-03-03 ENCOUNTER — Ambulatory Visit
Admission: RE | Admit: 2014-03-03 | Discharge: 2014-03-03 | Disposition: A | Discharge status: Home / Self Care | Payer: BC Managed Care – PPO | Source: Ambulatory Visit | Attending: Neurosurgery | Admitting: Neurosurgery

## 2014-03-03 DIAGNOSIS — S4991XA Unspecified injury of right shoulder and upper arm, initial encounter: Secondary | ICD-10-CM

## 2014-07-31 ENCOUNTER — Encounter (HOSPITAL_COMMUNITY): Payer: Self-pay | Admitting: Emergency Medicine

## 2014-07-31 ENCOUNTER — Emergency Department (HOSPITAL_COMMUNITY)
Admission: EM | Admit: 2014-07-31 | Discharge: 2014-07-31 | Disposition: A | Discharge status: Home / Self Care | Payer: BC Managed Care – PPO | Source: Home / Self Care

## 2014-07-31 DIAGNOSIS — L0201 Cutaneous abscess of face: Secondary | ICD-10-CM

## 2014-07-31 MED ORDER — MINOCYCLINE HCL 100 MG PO CAPS: 100.0000 mg | ORAL_CAPSULE | Freq: Two times a day (BID) | ORAL | Status: DC

## 2014-07-31 NOTE — Discharge Instructions (Signed)
Warm compress twice a day when you take the antibiotic, take all of medicine, return as needed. °

## 2014-07-31 NOTE — ED Provider Notes (Signed)
CSN: [id number]     Arrival date & time 07/31/14  1548 History   None    Chief Complaint  Patient presents with  . Cyst   (Consider location/radiation/quality/duration/timing/severity/associated sxs/prior Treatment) Patient is a 43 y.o. male presenting with abscess. The history is provided by the patient.  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 Hypertension   . Thrombocytopenia 07/07/2012    138,000 08/26/11; 98,000 06/29/12  . 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 Appendectomy    . Hernia repair    . Tonsillectomy    . Adenoidectomy    . Anterior fusion cervical spine    . Posterior fusion cervical spine    . Lumbar fusion    . Knee arthroscopy      x2  . Irrigation and debridement sebaceous cyst      on scalp   No family history on file. History  Substance Use Topics  . Smoking status: Current Every Day Smoker  . Smokeless tobacco: Not on file  . Alcohol Use: No    Review of Systems  Constitutional: Negative.  Negative for fever and chills.  Skin: Positive for rash.    Allergies  Review of patient's allergies indicates no known allergies.  Home Medications   Prior to Admission medications   Medication Sig Start Date End Date Taking? Authorizing Provider  Olmesartan-Amlodipine-HCTZ (TRIBENZOR) 40-10-25 MG TABS Take 1 tablet by mouth daily.    Historical Provider, MD   BP 163/113 mmHg  Pulse 71  Temp(Src) 99 F (37.2 C) (Oral)  Resp 22  SpO2 95% Physical Exam  Constitutional: He is oriented to person, place,  and time. He appears well-developed and well-nourished. No distress.  Neck: Normal range of motion. Neck supple.  Lymphadenopathy:    He has no cervical adenopathy.  Neurological: He is alert and oriented to person, place, and time.  Skin: Skin is warm and dry.  1.5cm fluctuant cyst on left mandible, purulent fluid expressed until resolved, cultured  Nursing note and vitals reviewed.   ED Course  Procedures (including critical care time) Labs Review Labs Reviewed  WOUND CULTURE    Imaging Review No results found.   MDM  No diagnosis found.     Linna HoffJames D Merilynn Haydu, MD 07/31/14 [id number]

## 2014-07-31 NOTE — ED Notes (Signed)
Reports cyst to left side of face noticed 2 weeks ago, squeezed this area and went away, two days ago, bump reoccurred.

## 2014-08-03 ENCOUNTER — Telehealth (HOSPITAL_COMMUNITY): Payer: Self-pay | Admitting: *Deleted

## 2014-08-03 LAB — WOUND CULTURE: Gram Stain: NONE SEEN

## 2014-08-03 NOTE — ED Notes (Signed)
Wound culture L face: few Proteus Mirabilis.  Treated with I and D and Minocycline- not on sensitivity.  Discussed with Dr. Piedad ClimesHonig and she said to call for clinical improvement.  If not better to change him to Bactrim DS.  I called pt. and left a message to call.  Call 1. Pt. called back.  Pt. verified x 2 and given results. Pt. said it is going down, odor is gone and decreased drainage.  I told him it sounds better so no change in antibiotic needed.  I told him if it worsens in any way to call back and to finish all of the antibiotics.  Pt. voiced understanding. Vassie MoselleYork, Allenmichael Mcpartlin M 08/03/2014

## 2014-11-14 ENCOUNTER — Other Ambulatory Visit: Payer: Self-pay | Admitting: Neurosurgery

## 2014-11-14 DIAGNOSIS — M5020 Other cervical disc displacement, unspecified cervical region: Secondary | ICD-10-CM

## 2014-11-26 ENCOUNTER — Ambulatory Visit
Admission: RE | Admit: 2014-11-26 | Discharge: 2014-11-26 | Disposition: A | Discharge status: Home / Self Care | Payer: BLUE CROSS/BLUE SHIELD | Source: Ambulatory Visit | Attending: Neurosurgery | Admitting: Neurosurgery

## 2014-11-26 DIAGNOSIS — M5020 Other cervical disc displacement, unspecified cervical region: Secondary | ICD-10-CM

## 2016-05-19 ENCOUNTER — Encounter (INDEPENDENT_AMBULATORY_CARE_PROVIDER_SITE_OTHER): Payer: Self-pay

## 2016-11-13 ENCOUNTER — Encounter (HOSPITAL_COMMUNITY): Payer: Self-pay | Admitting: Emergency Medicine

## 2016-11-13 ENCOUNTER — Emergency Department (HOSPITAL_BASED_OUTPATIENT_CLINIC_OR_DEPARTMENT_OTHER)
Admission: RE | Admit: 2016-11-13 | Discharge: 2016-11-13 | Disposition: A | Discharge status: Home / Self Care | Payer: BLUE CROSS/BLUE SHIELD | Source: Ambulatory Visit | Attending: Emergency Medicine | Admitting: Emergency Medicine

## 2016-11-13 ENCOUNTER — Emergency Department (HOSPITAL_COMMUNITY)
Admission: EM | Admit: 2016-11-13 | Discharge: 2016-11-13 | Disposition: A | Discharge status: Home / Self Care | Payer: Self-pay | Attending: Emergency Medicine | Admitting: Emergency Medicine

## 2016-11-13 DIAGNOSIS — Z96651 Presence of right artificial knee joint: Secondary | ICD-10-CM | POA: Insufficient documentation

## 2016-11-13 DIAGNOSIS — F17200 Nicotine dependence, unspecified, uncomplicated: Secondary | ICD-10-CM | POA: Insufficient documentation

## 2016-11-13 DIAGNOSIS — M79609 Pain in unspecified limb: Secondary | ICD-10-CM | POA: Diagnosis not present

## 2016-11-13 DIAGNOSIS — M79604 Pain in right leg: Secondary | ICD-10-CM | POA: Insufficient documentation

## 2016-11-13 DIAGNOSIS — I10 Essential (primary) hypertension: Secondary | ICD-10-CM | POA: Insufficient documentation

## 2016-11-13 NOTE — Progress Notes (Signed)
VASCULAR LAB PRELIMINARY  PRELIMINARY  PRELIMINARY  PRELIMINARY  Right lower extremity venous duplex completed.    Preliminary report:  Right:  No evidence of DVT, superficial thrombosis, or Baker's cyst.  Aviv Rota, RVS 11/13/2016, 2:42 PM

## 2016-11-13 NOTE — ED Provider Notes (Signed)
Emergency Department Provider Note   I have reviewed the triage vital signs and the nursing notes.   HISTORY  Chief Complaint Leg Pain   HPI Ricky [name] is a 46 y.o. male with PMH of HTN and right knee arthroplasty presents to the emergency department for evaluation of worsening posterior knee and calf pain. Patient had surgery on the knee in August 2017. He has undergone physical therapy and steroid injections with interim relief in significant pain symptoms. The patient had pain return to the knee several days ago. He states it's mostly in the back of the knee and calf. He put on his knee brace which improved symptoms. He took it off yesterday but returned. He called his physical therapist to recommended emergency department presentation for posterior knee symptoms. In the past the pain has been primarily anterior and lateral. She has tried home medications with no relief in symptoms. Denies any chest pain or difficulty breathing. No radiation of pain. No significant swelling or redness in the leg. No history of DVT.    Past Medical History:  Diagnosis Date  . 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 Hypertension   . Thrombocytopenia (HCC) 07/07/2012   138,000 08/26/11; 98,000 06/29/12    Patient Active Problem List   Diagnosis Date Noted  . Thrombocytopenia (HCC) 07/07/2012  . 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 ADENOIDECTOMY    . ANTERIOR FUSION CERVICAL SPINE    . APPENDECTOMY    . HERNIA REPAIR    . IRRIGATION AND DEBRIDEMENT SEBACEOUS CYST     on scalp  . KNEE ARTHROSCOPY     x2  . LUMBAR FUSION    . POSTERIOR FUSION CERVICAL SPINE    . TONSILLECTOMY      Current Outpatient Rx  . Order #: [id number] Class: Print  . Order #: [id number] Class: Historical Med    Allergies Patient has no known allergies.  History reviewed. No pertinent family history.  Social  History Social History  Substance Use Topics  . Smoking status: Current Every Day Smoker  . Smokeless tobacco: Not on file  . Alcohol use No    Review of Systems  10-point ROS otherwise negative.  ____________________________________________   PHYSICAL EXAM:  VITAL SIGNS: ED Triage Vitals [11/13/16 1216]  Enc Vitals Group     BP (!) 154/115     Pulse Rate 71     Resp 16     Temp 98.5 F (36.9 C)     Temp Source Oral     SpO2 100 %   Constitutional: Alert and oriented. Well appearing and in no acute distress. Eyes: Conjunctivae are normal.  Head: Atraumatic. Nose: No congestion/rhinnorhea. Mouth/Throat: Mucous membranes are moist.  Neck: No stridor.   Cardiovascular: Normal rate, regular rhythm. Good peripheral circulation. Grossly normal heart sounds.   Respiratory: Normal respiratory effort.  No retractions. Lungs CTAB. Gastrointestinal: Soft and nontender. No distention.  Musculoskeletal: No lower extremity edema. Positive posterior right knee tenderness to palpation with additional tenderness to the calf. No gross deformities of extremities. Neurologic:  Normal speech and language. No gross focal neurologic deficits are appreciated.  Skin:  Skin is warm, dry and intact. No rash noted. Psychiatric: Mood and affect are normal. Speech and behavior are normal.  ____________________________________________  RADIOLOGY  VASCULAR LAB PRELIMINARY  PRELIMINARY  PRELIMINARY  PRELIMINARY  Right lower extremity venous duplex completed.  Preliminary report:  Right:  No evidence of DVT, superficial thrombosis, or Baker's cyst.  SLAUGHTER, [place], RVS 11/13/2016, 2:42 PM ____________________________________________   PROCEDURES  Procedure(s) performed:   Procedures  None ____________________________________________   INITIAL IMPRESSION / ASSESSMENT AND PLAN / ED COURSE  Pertinent labs & imaging results that were available during my care of the patient  were reviewed by me and considered in my medical decision making (see chart for details).  Patient resents to the emergency department for evaluation of right leg pain. He has a remote history of knee surgery on the leg. No appreciable swelling or joint redness. Very low suspicion for septic arthritis. Patient does have tenderness posteriorly and in the calf.   02:35 PM Patient with negative DVT on bedside ultrasound. Technician stopped by the office to report this verbally. Plan for continued conservative care and discharge.   At this time, I do not feel there is any life-threatening condition present. I have reviewed and discussed all results (EKG, imaging, lab, urine as appropriate), exam findings with patient. I have reviewed nursing notes and appropriate previous records.  I feel the patient is safe to be discharged home without further emergent workup. Discussed usual and customary return precautions. Patient and family (if present) verbalize understanding and are comfortable with this plan.  Patient will follow-up with their primary care provider. If they do not have a primary care provider, information for follow-up has been provided to them. All questions have been answered.  ____________________________________________  FINAL CLINICAL IMPRESSION(S) / ED DIAGNOSES  Final diagnoses:  Right leg pain     MEDICATIONS GIVEN DURING THIS VISIT:  None  NEW OUTPATIENT MEDICATIONS STARTED DURING THIS VISIT:  None   Note:  This document was prepared using Dragon voice recognition software and may include unintentional dictation errors.  Alona BeneJoshua Jary Louvier, MD Emergency Medicine   Maia PlanJoshua G Roniya Tetro, MD 11/13/16 [phone]

## 2016-11-13 NOTE — ED Triage Notes (Signed)
Pt c/o posterior right knee pain and weakness x 3 days, was better last night after wearing braces, worse again today. No SOB or chest pain or edema. Tenderness to popliteal area, pain with ROM and foot dorsiflexion. Hx knee surgery last year, had cortisone injections and joint lubricant injections but he ran out of his disability so he was unable to get his final injection.

## 2016-11-13 NOTE — Discharge Instructions (Signed)

## 2016-11-13 NOTE — ED Notes (Signed)
Pt presents with pain behind the right knee that increases with ambulation.  Pt has full ROM.  Skin color and temperature are equal on both sides.  Slight edema noted to right knee.

## 2018-09-15 ENCOUNTER — Other Ambulatory Visit: Payer: Self-pay | Admitting: Neurosurgery

## 2018-09-15 DIAGNOSIS — M4716 Other spondylosis with myelopathy, lumbar region: Secondary | ICD-10-CM

## 2018-09-19 ENCOUNTER — Other Ambulatory Visit: Payer: Self-pay

## 2018-10-02 ENCOUNTER — Ambulatory Visit
Admission: RE | Admit: 2018-10-02 | Discharge: 2018-10-02 | Disposition: A | Discharge status: Home / Self Care | Payer: Medicare Other | Source: Ambulatory Visit | Attending: Neurosurgery | Admitting: Neurosurgery

## 2018-10-02 DIAGNOSIS — M4716 Other spondylosis with myelopathy, lumbar region: Secondary | ICD-10-CM

## 2019-06-10 ENCOUNTER — Other Ambulatory Visit: Payer: Self-pay

## 2019-06-10 ENCOUNTER — Encounter (HOSPITAL_COMMUNITY): Payer: Self-pay | Admitting: Emergency Medicine

## 2019-06-10 ENCOUNTER — Emergency Department (HOSPITAL_COMMUNITY)
Admission: EM | Admit: 2019-06-10 | Discharge: 2019-06-10 | Disposition: A | Discharge status: Home / Self Care | Payer: Medicare Other | Attending: Emergency Medicine | Admitting: Emergency Medicine

## 2019-06-10 DIAGNOSIS — I10 Essential (primary) hypertension: Secondary | ICD-10-CM | POA: Diagnosis not present

## 2019-06-10 DIAGNOSIS — Z79899 Other long term (current) drug therapy: Secondary | ICD-10-CM | POA: Diagnosis not present

## 2019-06-10 DIAGNOSIS — U071 COVID-19: Secondary | ICD-10-CM | POA: Diagnosis not present

## 2019-06-10 DIAGNOSIS — Z20828 Contact with and (suspected) exposure to other viral communicable diseases: Secondary | ICD-10-CM | POA: Diagnosis present

## 2019-06-10 DIAGNOSIS — F17200 Nicotine dependence, unspecified, uncomplicated: Secondary | ICD-10-CM | POA: Insufficient documentation

## 2019-06-10 DIAGNOSIS — Z20822 Contact with and (suspected) exposure to covid-19: Secondary | ICD-10-CM

## 2019-06-10 MED ORDER — ONDANSETRON HCL 4 MG PO TABS: 4.0000 mg | ORAL_TABLET | Freq: Four times a day (QID) | ORAL | 0 refills | Status: DC

## 2019-06-10 NOTE — ED Triage Notes (Addendum)
Pt here for COVID test after exposure. Pt was tested Monday at PCP but didn't receive results. Pt has had nausea, diarrhea, fevers, productive cough, and decreased fluid intake beginning Saturday. VSS

## 2019-06-10 NOTE — ED Provider Notes (Signed)
[name]   CSN: [id number] Arrival date & time: 06/10/19  1552     History   Chief Complaint Chief Complaint  Patient presents with  . COVID exposure    HPI Ricky [name] is a 48 y.o. male.     Patient is a 48 year old gentleman with past medical history of thrombocytopenia, hypertension presenting to the emergency department for COVID-19 symptoms and exposure.  Patient reports that he was exposed on the 13th of this month to a patient who tested positive.  Reports that he began to have symptoms on the 17th of this month.  Reports that he has had cough, body aches, fever up to 102.7, nausea without vomiting, diarrhea.  Reports that he is able to keep fluids down and has been drinking lots of water and eating soup.  Reports that he does have a good appetite and is hungry now.  Denies any chest pain, shortness of breath, orthopnea.  He was seen by his primary care doctor who gave him azithromycin and dexamethasone which she has been taking.     Past Medical History:  Diagnosis Date  . 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 Hypertension   . Thrombocytopenia (HCC) 07/07/2012   138,000 08/26/11; 98,000 06/29/12    Patient Active Problem List   Diagnosis Date Noted  . Thrombocytopenia (HCC) 07/07/2012  . 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 ADENOIDECTOMY    . ANTERIOR FUSION CERVICAL SPINE    . APPENDECTOMY    . HERNIA REPAIR    . IRRIGATION AND DEBRIDEMENT SEBACEOUS CYST     on scalp  . KNEE ARTHROSCOPY     x2  . LUMBAR FUSION    . POSTERIOR FUSION CERVICAL SPINE    . TONSILLECTOMY          Home Medications    Prior to Admission medications   Medication Sig Start Date End Date Taking? Authorizing Provider  minocycline (MINOCIN,DYNACIN) 100 MG capsule Take 1 capsule (100 mg total) by mouth 2 (two) times daily.  07/31/14   Linna HoffKindl, Eldon D, MD  Olmesartan-Amlodipine-HCTZ (TRIBENZOR) 40-10-25 MG TABS Take 1 tablet by mouth daily.    [provider]  ondansetron (ZOFRAN) 4 MG tablet Take 1 tablet (4 mg total) by mouth every 6 (six) hours. 06/10/19   Arlyn DunningMcLean, Shayle Donahoo A, PA-C    Family History History reviewed. No pertinent family history.  Social History Social History   Tobacco Use  . Smoking status: Current Every Day Smoker  Substance Use Topics  . Alcohol use: No  . Drug use: No     Allergies   Patient has no known allergies.   Review of Systems Review of Systems  Constitutional: Positive for chills, fatigue and fever. Negative for appetite change.  HENT: Positive for sore throat. Negative for congestion.   Respiratory: Positive for cough. Negative for shortness of breath.   Gastrointestinal: Positive for diarrhea. Negative for abdominal pain, nausea and vomiting.  Genitourinary: Negative for dysuria, flank pain, hematuria and urgency.  Musculoskeletal: Positive for myalgias.  Skin: Negative for rash and wound.  Allergic/Immunologic: Negative for immunocompromised state.  Neurological: Negative for dizziness, seizures, weakness, light-headedness and headaches.  Hematological: Does not bruise/bleed easily.     Physical Exam Updated Vital Signs BP (!) 152/115 (BP Location: Right Arm)   Pulse 100   Temp 99.3 F (37.4 C) (Oral)  Resp 20   SpO2 98%   Physical Exam Vitals signs and nursing note reviewed.  Constitutional:      Appearance: Normal appearance.  HENT:     Head: Normocephalic.     Nose: No congestion.     Mouth/Throat:     Mouth: Mucous membranes are moist.  Eyes:     Conjunctiva/sclera: Conjunctivae normal.  Neck:     Musculoskeletal: Normal range of motion. No neck rigidity.  Cardiovascular:     Rate and Rhythm: Normal rate and regular rhythm.     Pulses: Normal pulses.  Pulmonary:     Effort: Pulmonary effort is normal. No respiratory distress.      Breath sounds: Normal breath sounds. No stridor. No wheezing, rhonchi or rales.  Chest:     Chest wall: No tenderness.  Abdominal:     General: Bowel sounds are normal.  Skin:    General: Skin is warm and dry.  Neurological:     Mental Status: He is alert.  Psychiatric:        Mood and Affect: Mood normal.        Behavior: Behavior normal.      ED Treatments / Results  Labs (all labs ordered are listed, but only abnormal results are displayed) Labs Reviewed  SARS CORONAVIRUS 2 (TAT 6-24 HRS)    EKG None  Radiology No results found.  Procedures Procedures (including critical care time)  Medications Ordered in ED Medications - No data to display   Initial Impression / Assessment and Plan / ED Course  I have reviewed the triage vital signs and the nursing notes.  Pertinent labs & imaging results that were available during my care of the patient were reviewed by me and considered in my medical decision making (see chart for details).  Clinical Course as of Jun 09 2142  Fri Jun 10, 2019  2116 Patient presenting with Covid symptoms after Covid exposure.  Able to tolerate p.o. and is keeping down fluids.  Appears well-hydrated in no acute distress with normal lung sounds.  Patient is not hypoxic and appears healthy.  Does not meet admission criteria at this time.  He was mildly hypertensive on my exam but having no chest pain or shortness of breath.  Patient was swabbed for Covid and given instructions on home quarantine.  Advised on return precautions.  We will send a prescription for Zofran for nausea.  He should continue taking his dexamethasone   [KM]    Clinical Course User Index [KM] Arlyn Dunning, PA-C       Based on review of vitals, medical screening exam, lab work and/or imaging, there does not appear to be an acute, emergent etiology for the patient's symptoms. Counseled pt on good return precautions and encouraged both PCP and ED follow-up as needed.   Prior to discharge, I also discussed incidental imaging findings with patient in detail and advised appropriate, recommended follow-up in detail.  Clinical Impression: 1. Suspected COVID-19 virus infection     Disposition: Discharge  Prior to providing a prescription for a controlled substance, I independently reviewed the patient's recent prescription history on the [place] Controlled Substance Reporting System. The patient had no recent or regular prescriptions and was deemed appropriate for a brief, less than 3 day prescription of narcotic for acute analgesia.  This note was prepared with assistance of Conservation officer, historic buildings. Occasional wrong-word or sound-a-like substitutions may have occurred due to the inherent limitations of voice recognition software.  Final Clinical Impressions(s) / ED Diagnoses   Final diagnoses:  Suspected COVID-19 virus infection    ED Discharge Orders         Ordered    ondansetron (ZOFRAN) 4 MG tablet  Every 6 hours     06/10/19 2117           Kristine Royal 06/10/19 2143    Blanchie Dessert, MD 06/11/19 1535

## 2019-06-10 NOTE — ED Notes (Signed)
Patient verbalizes understanding of discharge instructions. Opportunity for questioning and answers were provided. Armband removed by staff, pt discharged from ED.  

## 2019-06-10 NOTE — Discharge Instructions (Signed)
Thank you for allowing me to care for you today. Please return to the emergency department if you have new or worsening symptoms. Take your medications as instructed.  ° °

## 2019-06-11 LAB — SARS CORONAVIRUS 2 (TAT 6-24 HRS): SARS Coronavirus 2: POSITIVE — AB

## 2019-07-10 ENCOUNTER — Encounter (HOSPITAL_COMMUNITY): Payer: Self-pay

## 2019-07-10 ENCOUNTER — Emergency Department (HOSPITAL_COMMUNITY): Payer: Medicare Other

## 2019-07-10 ENCOUNTER — Other Ambulatory Visit: Payer: Self-pay

## 2019-07-10 ENCOUNTER — Emergency Department (HOSPITAL_COMMUNITY)
Admission: EM | Admit: 2019-07-10 | Discharge: 2019-07-10 | Disposition: A | Discharge status: Home / Self Care | Payer: Medicare Other | Attending: Emergency Medicine | Admitting: Emergency Medicine

## 2019-07-10 DIAGNOSIS — Z79899 Other long term (current) drug therapy: Secondary | ICD-10-CM | POA: Diagnosis not present

## 2019-07-10 DIAGNOSIS — R42 Dizziness and giddiness: Secondary | ICD-10-CM | POA: Insufficient documentation

## 2019-07-10 DIAGNOSIS — I10 Essential (primary) hypertension: Secondary | ICD-10-CM | POA: Diagnosis not present

## 2019-07-10 DIAGNOSIS — Z8619 Personal history of other infectious and parasitic diseases: Secondary | ICD-10-CM

## 2019-07-10 DIAGNOSIS — M791 Myalgia, unspecified site: Secondary | ICD-10-CM | POA: Diagnosis not present

## 2019-07-10 DIAGNOSIS — R748 Abnormal levels of other serum enzymes: Secondary | ICD-10-CM

## 2019-07-10 DIAGNOSIS — F17210 Nicotine dependence, cigarettes, uncomplicated: Secondary | ICD-10-CM | POA: Insufficient documentation

## 2019-07-10 DIAGNOSIS — R079 Chest pain, unspecified: Secondary | ICD-10-CM

## 2019-07-10 DIAGNOSIS — R52 Pain, unspecified: Secondary | ICD-10-CM

## 2019-07-10 DIAGNOSIS — R0789 Other chest pain: Secondary | ICD-10-CM | POA: Diagnosis present

## 2019-07-10 DIAGNOSIS — Z8616 Personal history of COVID-19: Secondary | ICD-10-CM

## 2019-07-10 DIAGNOSIS — I498 Other specified cardiac arrhythmias: Secondary | ICD-10-CM | POA: Diagnosis not present

## 2019-07-10 LAB — BASIC METABOLIC PANEL
Anion gap: 8 (ref 5–15)
BUN: 12 mg/dL (ref 6–20)
CO2: 26 mmol/L (ref 22–32)
Calcium: 9.1 mg/dL (ref 8.9–10.3)
Chloride: 101 mmol/L (ref 98–111)
Creatinine, Ser: 0.73 mg/dL (ref 0.61–1.24)
GFR calc Af Amer: >60 mL/min (ref >60)
GFR calc non Af Amer: >60 mL/min (ref >60)
Glucose, Bld: 119 mg/dL — ABNORMAL HIGH (ref 70–99)
Potassium: 3.4 mmol/L — ABNORMAL LOW (ref 3.5–5.1)
Sodium: 135 mmol/L (ref 135–145)

## 2019-07-10 LAB — CBC
HCT: 45.6 % (ref 39.0–52.0)
Hemoglobin: 15.1 g/dL (ref 13.0–17.0)
MCH: 29.7 pg (ref 26.0–34.0)
MCHC: 33.1 g/dL (ref 30.0–36.0)
MCV: 89.8 fL (ref 80.0–100.0)
Platelets: 157 10*3/uL (ref 150–400)
RBC: 5.08 MIL/uL (ref 4.22–5.81)
RDW: 14.6 % (ref 11.5–15.5)
WBC: 8.5 10*3/uL (ref 4.0–10.5)
nRBC: 0 % (ref 0.0–0.2)

## 2019-07-10 LAB — HEPATIC FUNCTION PANEL
ALT: 23 U/L (ref 0–44)
AST: 37 U/L (ref 15–41)
Albumin: 3.7 g/dL (ref 3.5–5.0)
Alkaline Phosphatase: 68 U/L (ref 38–126)
Bilirubin, Direct: 0.2 mg/dL (ref 0.0–0.2)
Indirect Bilirubin: 0.6 mg/dL (ref 0.3–0.9)
Total Bilirubin: 0.8 mg/dL (ref 0.3–1.2)
Total Protein: 7.4 g/dL (ref 6.5–8.1)

## 2019-07-10 LAB — CK: Total CK: 856 U/L — ABNORMAL HIGH (ref 49–397)

## 2019-07-10 LAB — TROPONIN I (HIGH SENSITIVITY)
Troponin I (High Sensitivity): 10 ng/L (ref <18)
Troponin I (High Sensitivity): 10 ng/L (ref <18)

## 2019-07-10 LAB — MAGNESIUM: Magnesium: 2.1 mg/dL (ref 1.7–2.4)

## 2019-07-10 MED ORDER — SODIUM CHLORIDE 0.9% FLUSH: 3.0000 mL | Freq: Once | INTRAVENOUS | Status: DC

## 2019-07-10 MED ORDER — CYCLOBENZAPRINE HCL 10 MG PO TABS: 10.0000 mg | ORAL_TABLET | Freq: Two times a day (BID) | ORAL | 0 refills | Status: DC | PRN

## 2019-07-10 MED ORDER — POTASSIUM CHLORIDE CRYS ER 20 MEQ PO TBCR
40.0000 meq | EXTENDED_RELEASE_TABLET | Freq: Once | ORAL | Status: AC
  Administered 2019-07-10: 40 meq via ORAL
  Filled 2019-07-10: qty 2

## 2019-07-10 NOTE — ED Notes (Signed)
Patient verbalizes understanding of discharge instructions. Opportunity for questioning and answers were provided. Armband removed by staff, pt discharged from ED.  

## 2019-07-10 NOTE — ED Notes (Signed)
Lab to add on CK and hepatic function panel to already collected light green tube.

## 2019-07-10 NOTE — ED Triage Notes (Signed)
Pt from home with ems for initial c.o chest tightness and low platelets. Tested positive for COVID 1 month ago. Pt denies any other symptoms, pt seen at PCP earlier this week. Pt arrives alert and oriented

## 2019-07-10 NOTE — ED Provider Notes (Signed)
[facility] EMERGENCY DEPARTMENT Provider Note   CSN: [id number] Arrival date & time: 07/10/19  0054     History   Chief Complaint Chief Complaint  Patient presents with  . Chest Pain    HPI Ricky [name] is a 48 y.o. male.     HPI   Presents with concern for chest pain/body aches Began Wednesday, went to Dr. On Thursday Full body cramps and cramp in chest as well Thought maybe dehydrated, started drinking OJ/gatorade, BP pills and thought maybe K or Mg low and felt a little better at first but then cramping continued.  Saw Dr. On Thursday One month before that platelets low, had been low prior Yesterday brother, mother at [employer] and began to feel lightheaded, dizzy, cramps. Thought maybe was dehydrated.   Laying in recliner last night and couldn't get comfortable, cramping all over the body and cramping in the chest. Still having mild cramp in lower left chest, legs, arms  No new medications COVID 10/24, felt terrible at that time, has improved since then, mild continuing cough since initial infection  Father was diagnosed with some type of muscular dystrophy or atrophy 47yr prior to his death  NO fam hx of CAD No smoking, etoh, drugs No hx of DVT/PE, recent travel or surgery   Past Medical History:  Diagnosis Date  . 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 Hypertension   . Thrombocytopenia ([place]) 07/07/2012   138,000 08/26/11; 98,000 06/29/12    Patient Active Problem List   Diagnosis Date Noted  . Thrombocytopenia ([place]) 07/07/2012  . 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 ADENOIDECTOMY    . ANTERIOR FUSION CERVICAL SPINE    . APPENDECTOMY    . HERNIA REPAIR    . IRRIGATION AND DEBRIDEMENT SEBACEOUS CYST     on scalp  . KNEE ARTHROSCOPY     x2  . LUMBAR FUSION    . POSTERIOR FUSION CERVICAL SPINE    . TONSILLECTOMY           Home Medications    Prior to Admission medications   Medication Sig Start Date End Date Taking? Authorizing Provider  cyclobenzaprine (FLEXERIL) 10 MG tablet Take 1 tablet (10 mg total) by mouth 2 (two) times daily as needed for muscle spasms. 07/10/19   Gareth Morgan, MD  minocycline (MINOCIN,DYNACIN) 100 MG capsule Take 1 capsule (100 mg total) by mouth 2 (two) times daily. 07/31/14   Billy Fischer, MD  Olmesartan-Amlodipine-HCTZ (TRIBENZOR) 40-10-25 MG TABS Take 1 tablet by mouth daily.    [provider]  ondansetron (ZOFRAN) 4 MG tablet Take 1 tablet (4 mg total) by mouth every 6 (six) hours. 06/10/19   Alveria Apley, PA-C    Family History No family history on file.  Social History Social History   Tobacco Use  . Smoking status: Current Every Day Smoker  Substance Use Topics  . Alcohol use: No  . Drug use: No     Allergies   Patient has no known allergies.   Review of Systems Review of Systems  Constitutional: Positive for appetite change and diaphoresis (since Wednesday, has had episodes, had it while walking around but no other associated symptoms). Negative for fever.  HENT: Negative for congestion.   Respiratory: Negative for shortness of breath. Cough: since COVID has had dry cough in throat, not changed over last month.  Cardiovascular: Positive for chest pain.  Gastrointestinal: Negative for abdominal pain, nausea and vomiting.  Genitourinary: Negative for dysuria.  Musculoskeletal: Positive for arthralgias and myalgias.  Skin: Negative for rash.  Neurological: Positive for light-headedness (occured yesterday once, Friday, didn't happen wednesday or thursday, did have it some last night on arrival). Negative for syncope and headaches.     Physical Exam Updated Vital Signs BP (!) 131/99   Pulse 63   Temp 98.2 F (36.8 C) (Oral)   Resp 18   SpO2 98%   Physical Exam Vitals signs and nursing note reviewed.  Constitutional:      General:  He is not in acute distress.    Appearance: He is well-developed. He is not diaphoretic.  HENT:     Head: Normocephalic and atraumatic.  Eyes:     Conjunctiva/sclera: Conjunctivae normal.  Neck:     Musculoskeletal: Normal range of motion.  Cardiovascular:     Rate and Rhythm: Normal rate and regular rhythm.     Heart sounds: Normal heart sounds. No murmur. No friction rub. No gallop.   Pulmonary:     Effort: Pulmonary effort is normal. No respiratory distress.     Breath sounds: Normal breath sounds. No wheezing or rales.  Abdominal:     General: There is no distension.     Palpations: Abdomen is soft.     Tenderness: There is no abdominal tenderness. There is no guarding.  Skin:    General: Skin is warm and dry.  Neurological:     Mental Status: He is alert and oriented to person, place, and time.      ED Treatments / Results  Labs (all labs ordered are listed, but only abnormal results are displayed) Labs Reviewed  BASIC METABOLIC PANEL - Abnormal; Notable for the following components:      Result Value   Potassium 3.4 (*)    Glucose, Bld 119 (*)    All other components within normal limits  CK - Abnormal; Notable for the following components:   Total CK 856 (*)    All other components within normal limits  CBC  HEPATIC FUNCTION PANEL  MAGNESIUM  TROPONIN I (HIGH SENSITIVITY)  TROPONIN I (HIGH SENSITIVITY)    EKG EKG Interpretation  Date/Time:  Sunday July 10 2019 01:03:03 EST Ventricular Rate:  80 PR Interval:  122 QRS Duration: 94 QT Interval:  378 QTC Calculation: 435 R Axis:   -10 Text Interpretation: Sinus rhythm with frequent Premature ventricular complexes in a pattern of bigeminy Left ventricular hypertrophy with repolarization abnormality ( R in aVL ) Abnormal ECG Since prior ECG< bigeminy is new , no other significant changes Confirmed by Alvira Monday ([id number]) on 07/10/2019 9:20:52 AM   Radiology Dg Chest 2 View  Result Date: 07/10/2019  CLINICAL DATA:  Chest pain. Chest tightness. EXAM: CHEST - 2 VIEW COMPARISON:  06/28/2007 FINDINGS: The cardiomediastinal contours are unchanged with mild cardiomegaly. Pulmonary vasculature is normal. No consolidation, pleural effusion, or pneumothorax. No acute osseous abnormalities are seen. Surgical hardware in the lower cervical spine is partially included. IMPRESSION: Mild chronic cardiomegaly.  No acute findings. Electronically Signed   By: Narda Rutherford M.D.   On: 07/10/2019 01:54    Procedures Procedures (including critical care time)  Medications Ordered in ED Medications  sodium chloride flush (NS) 0.9 % injection 3 mL (has no administration in time range)  potassium chloride SA (KLOR-CON) CR tablet 40 mEq (40 mEq Oral Given 07/10/19 0955)  Initial Impression / Assessment and Plan / ED Course  I have reviewed the triage vital signs and the nursing notes.  Pertinent labs & imaging results that were available during my care of the patient were reviewed by me and considered in my medical decision making (see chart for details).        [name] is a very pleasant 48 year old male with history of hypertension, diagnosis of COVID-19 October 24, presents with concern for migratory body cramping including chest cramping and episodes of lightheadedness.    EKG was evaluate by me and shows ventricular bigeminy which is new from prior.  No other acute ischemic changes.  Delta troponins were completed which were stable at 10.  Low suspicion for pulmonary embolus in absence of shortness of breath, hypoxia, tachypnea, asymmetric leg swelling.  History is not consistent with aortic dissection.  Do not see signs of ACS, and symptoms are atypical for angina, low risk HEART score.  Doubt significant myocarditis given normal troponins, no signs of CHF on CXR>  (chronic mild cardiomegaly unchanged.) No sign of pneumonia or pneumothorax.  Regarding body aches, has mild hypokalemia, normal  Mg. Given po K in ED.  CK mildly elevated in 800s, not consistent with risk of rhabdomyolysis. Unclear etiology, possible continuing sequela of COVID19 infection. Recommend follow up with PCP, recheck of values.  Regarding episodes of lightheadedness, discussed bigeminy with Dr. Elease HashimotoNahser to expedite outpatient follow up.  Patient discharged in stable condition with understanding of reasons to return.    Final Clinical Impressions(s) / ED Diagnoses   Final diagnoses:  Lightheadedness  Generalized body aches  Chest pain, unspecified type  Ventricular bigeminy  History of 2019 novel coronavirus disease (COVID-19)  Elevated CK    ED Discharge Orders         Ordered    cyclobenzaprine (FLEXERIL) 10 MG tablet  2 times daily PRN     07/10/19 1105           Alvira MondaySchlossman, Lawson Isabell, MD 07/10/19 1130

## 2019-07-12 ENCOUNTER — Telehealth: Payer: Self-pay | Admitting: Cardiology

## 2019-07-12 NOTE — Telephone Encounter (Signed)
Tori from [place] is calling in regards to the request of patients medical records.

## 2019-07-17 NOTE — Progress Notes (Deleted)
Cardiology Office Note:    Date:  07/17/2019   ID:  Ricky [name], DOB September 05, 1970, MRN [id number]  PCP:  Suella Broad, FNP  Cardiologist:  No primary care provider on file.  Electrophysiologist:  None   Referring MD: Suella Broad   No chief complaint on file. ***  History of Present Illness:    Ricky [name] is a 48 y.o. male with a hx of hypertension who is referred by Dr. Burt Ek for an evaluation of chest pain.  He presented to [facility] ED on 07/10/2019 with chest pain.  Describes diffuse myalgias.  He was diagnosed with Covid on 10/24.  EKG showed bigeminy.  Negative troponins.  Past Medical History:  Diagnosis Date  . 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 Hypertension   . Thrombocytopenia ([place]) 07/07/2012   138,000 08/26/11; 98,000 06/29/12    Past Surgical History:  Procedure Laterality Date  . ADENOIDECTOMY    . ANTERIOR FUSION CERVICAL SPINE    . APPENDECTOMY    . HERNIA REPAIR    . IRRIGATION AND DEBRIDEMENT SEBACEOUS CYST     on scalp  . KNEE ARTHROSCOPY     x2  . LUMBAR FUSION    . POSTERIOR FUSION CERVICAL SPINE    . TONSILLECTOMY      Current Medications: No outpatient medications have been marked as taking for the 07/19/19 encounter (Appointment) with Donato Heinz, MD.     Allergies:   Patient has no known allergies.   Social History   Socioeconomic History  . Marital status: Single    Spouse name: Not on file  . Number of children: Not on file  . Years of education: Not on file  . Highest education level: Not on file  Occupational History  . Not on file  Social Needs  . Financial resource strain: Not on file  . Food insecurity    Worry: Not on file    Inability: Not on file  . Transportation needs    Medical: Not on file    Non-medical: Not on file  Tobacco Use  . Smoking status: Current Every Day Smoker  Substance and Sexual Activity  . Alcohol use: No  .  Drug use: No  . Sexual activity: Not on file  Lifestyle  . Physical activity    Days per week: Not on file    Minutes per session: Not on file  . Stress: Not on file  Relationships  . Social Herbalist on phone: Not on file    Gets together: Not on file    Attends religious service: Not on file    Active member of club or organization: Not on file    Attends meetings of clubs or organizations: Not on file    Relationship status: Not on file  Other Topics Concern  . Not on file  Social History Narrative  . Not on file     Family History: The patient's ***family history is not on file.  ROS:   Please see the history of present illness.    *** All other systems reviewed and are negative.  EKGs/Labs/Other Studies Reviewed:    The following studies were reviewed today: ***  EKG:  EKG is *** ordered today.  The ekg ordered today demonstrates ***  Recent Labs: 07/10/2019: ALT 23; BUN 12; Creatinine, Ser 0.73; Hemoglobin 15.1; Magnesium 2.1; Platelets 157; Potassium 3.4; Sodium 135  Recent Lipid Panel  No results found for: CHOL, TRIG, HDL, CHOLHDL, VLDL, LDLCALC, LDLDIRECT  Physical Exam:    VS:  There were no vitals taken for this visit.    Wt Readings from Last 3 Encounters:  11/26/14 290 lb (131.5 kg)  08/04/12 280 lb 4.8 oz (127.1 kg)     GEN: *** Well nourished, well developed in no acute distress HEENT: Normal NECK: No JVD; No carotid bruits LYMPHATICS: No lymphadenopathy CARDIAC: ***RRR, no murmurs, rubs, gallops RESPIRATORY:  Clear to auscultation without rales, wheezing or rhonchi  ABDOMEN: Soft, non-tender, non-distended MUSCULOSKELETAL:  No edema; No deformity  SKIN: Warm and dry NEUROLOGIC:  Alert and oriented x 3 PSYCHIATRIC:  Normal affect   ASSESSMENT:    No diagnosis found. PLAN:    In order of problems listed above:  Chest pain:  PVCs:  Hypertension: On olmesartan-amlodipine-HCTZ 40-10-25  RTC in  ###   Medication  Adjustments/Labs and Tests Ordered: Current medicines are reviewed at length with the patient today.  Concerns regarding medicines are outlined above.  No orders of the defined types were placed in this encounter.  No orders of the defined types were placed in this encounter.   There are no Patient Instructions on file for this visit.   Signed, Little Ishikawa, MD  07/17/2019 3:16 PM    [facility]

## 2019-07-19 ENCOUNTER — Ambulatory Visit: Payer: Medicaid Other | Admitting: Cardiology

## 2019-07-19 NOTE — Progress Notes (Signed)
Cardiology Office Note:    Date:  07/22/2019   ID:  Ricky [name], DOB 1971/06/22, MRN [id number]  PCP:  Maryagnes Amos, Ricky [name]  Cardiologist:  No primary care provider on file.  Electrophysiologist:  None   Referring MD: Maryagnes Amos   No chief complaint on file.   History of Present Illness:    Ricky [name] is a 48 y.o. male with a hx of hypertension, thrombocytopenia who is referred by Ricky Bee, Ricky [name] for an evaluation of PVCs.  He reported that he was diagnosed with Covid at the end of October.  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  Reports that he has had complete recovery and feels back to his normal self.  However had been having issues with cramping in his legs and chest.  Reports the cramps are in far left side of his chest under his arm.  Had an ED visit on 07/10/2019 with chest pain/body aches and lightheadedness.  EKG showed bigeminy.  High-sensitivity troponins were unremarkable.  Potassium was mildly reduced, he was given supplementation in the hospital.  Since discharge he has been drinking Gatorade regularly, and reports that he no longer is having cramping.  He denies any palpitations, lower extremity edema, lightheadedness, syncope.  Reports that he exercises for walking up to 1 mile.  Is limited by pain in his knees.  He denies any exertional chest pain or dyspnea.  He quit smoking 1 year ago.  Smoked half a pack per day for 6-7 years.  He denies any family history of heart disease.   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   Azilsartan-Chlorthalidone 40-25 MG TABS Take by mouth.   cyclobenzaprine (FLEXERIL) 10 MG tablet Take 1 tablet (10 mg total) by mouth 2 (two) times daily as needed for muscle spasms.     Allergies:   Patient has no known allergies.   Social History   Socioeconomic History   Marital status: Single    Spouse name: Not on file   Number of children: Not on file   Years of education: Not on file   Highest education level: Not on file  Occupational History   Not on file  Social Needs   Financial resource strain: Not on file   Food insecurity    Worry: Not on file    Inability: Not on file   Transportation needs    Medical: Not on file    Non-medical: Not on file  Tobacco Use   Smoking status: Current Every Day Smoker  Substance and Sexual Activity   Alcohol use: No   Drug use: No   Sexual activity: Not on file  Lifestyle   Physical activity    Days per week: Not on  file    Minutes per session: Not on file   Stress: Not on file  Relationships   Social connections    Talks on phone: Not on file    Gets together: Not on file    Attends religious service: Not on file    Active member of club or organization: Not on file    Attends meetings of clubs or organizations: Not on file    Relationship status: Not on file  Other Topics Concern   Not on file  Social History Narrative   Not on file     Family History: No family history of heart disease  ROS:   Please see the history of present illness.    All other systems reviewed and are negative.  EKGs/Labs/Other Studies Reviewed:    The following studies were reviewed today:  EKG:  EKG is ordered today.  The ekg ordered today demonstrates sinus rhythm, rate 78, frequent PVCs  Recent Labs: 07/10/2019: ALT 23; BUN  12; Creatinine, Ser 0.73; Hemoglobin 15.1; Magnesium 2.1; Platelets 157; Potassium 3.4; Sodium 135  Recent Lipid Panel No results found for: CHOL, TRIG, HDL, CHOLHDL, VLDL, LDLCALC, LDLDIRECT  Physical Exam:    VS:  BP (!) 142/98    Pulse (!) 101    Ht 6' (1.829 m)    Wt 282 lb 12.8 oz (128.3 kg)    SpO2 98%    BMI 38.35 kg/m     Wt Readings from Last 3 Encounters:  07/22/19 282 lb 12.8 oz (128.3 kg)  11/26/14 290 lb (131.5 kg)  08/04/12 280 lb 4.8 oz (127.1 kg)     GEN:  Well nourished, well developed in no acute distress HEENT: Normal NECK: No JVD LYMPHATICS: No lymphadenopathy CARDIAC: RRR, no murmurs, rubs, gallops RESPIRATORY:  Clear to auscultation without rales, wheezing or rhonchi  ABDOMEN: Soft, non-tender, non-distended MUSCULOSKELETAL:  No edema; No deformity  SKIN: Warm and dry NEUROLOGIC:  Alert and oriented x 3 PSYCHIATRIC:  Normal affect   ASSESSMENT:    1. Frequent PVCs   2. Chest pain of uncertain etiology   3. Essential hypertension    PLAN:    In order of problems listed above:  PVCs: Was in bigeminy in the ED and frequent PVCs on EKG today.  He is asymptomatic.  Could be caused by hypokalemia or hypomagnesemia related to the chlorthalidone.  Will check electrolytes.  Will check TTE to rule out structural heart disease.  Will check Zio patch x3 days to quantify PVC burden.  Chest pain: Reported cramping on far left side of chest, along with cramping in legs.  Resolved when he was given potassium supplementation in the ED and started drinking Gatorade at home.  Suspect hypokalemia or hypomagnesemia related to chlorthalidone.  Will check electrolytes as above.  He denies any exertional chest pain, will hold off on ischemia evaluation at this time.  Hypertension: On amlodipine 10 mg daily and azilsartan-chlorthalidone 40-25 mg.  BP elevated in clinic today.  Asked patient to check blood pressure daily for next 2 weeks and call with results.   RTC in 3  months  Medication Adjustments/Labs and Tests Ordered: Current medicines are reviewed at length with the patient today.  Concerns regarding medicines are outlined above.  Orders Placed This Encounter  Procedures   Basic metabolic panel   Magnesium   LONG TERM MONITOR (3-14 DAYS)   EKG 12-Lead   ECHOCARDIOGRAM COMPLETE   No orders of the defined types were placed in this  encounter.   Patient Instructions  Medication Instructions:  Your Physician recommend you continue on your current medication as directed.    *If you need a refill on your cardiac medications before your next appointment, please call your pharmacy*  Lab Work: Your physician recommends that you return for lab work today (BMP, MG)  If you have labs (blood work) drawn today and your tests are completely normal, you will receive your results only by:  MyChart Message (if you have MyChart) OR  A paper copy in the mail If you have any lab test that is abnormal or we need to change your treatment, we will call you to review the results.  Testing/Procedures: Your physician has requested that you have an echocardiogram. Echocardiography is a painless test that uses sound waves to create images of your heart. It provides your doctor with information about the size and shape of your heart and how well your hearts chambers and valves are working. This procedure takes approximately one hour. There are no restrictions for this procedure. [address]  Our physician has recommended that you wear an 3  DAY ZIO-PATCH monitor. The Zio patch cardiac monitor continuously records heart rhythm data for up to 14 days, this is for patients being evaluated for multiple types heart rhythms. For the first 24 hours post application, please avoid getting the Zio monitor wet in the shower or by excessive sweating during exercise. After that, feel free to carry on with regular activities. Keep soaps and lotions away from  the ZIO XT Patch.       Follow-Up: At [facility], you and your health needs are our priority.  As part of our continuing mission to provide you with exceptional heart care, we have created designated Provider Care Teams.  These Care Teams include your primary Cardiologist (physician) and Advanced Practice Providers (APPs -  Physician Assistants and Nurse Practitioners) who all work together to provide you with the care you need, when you need it.  Your next appointment:   3 month(s)  The format for your next appointment:   In Person  Provider:   Epifanio Lescheshristopher Marasia Newhall, MD  Christena DeemZIO XT- Long Term Monitor Instructions   Your physician has requested you wear your ZIO patch monitor__3_____days.   This is a single patch monitor.  [employer] supplies one patch monitor per enrollment.  Additional stickers are not available.   Please do not apply patch if you will be having a Nuclear Stress Test, Echocardiogram, Cardiac CT, MRI, or Chest Xray during the time frame you would be wearing the monitor. The patch cannot be worn during these tests.  You cannot remove and re-apply the ZIO XT patch monitor.   Your ZIO patch monitor will be sent USPS Priority mail from [facility] directly to your home address. The monitor may also be mailed to a PO BOX if home delivery is not available.   It may take 3-5 days to receive your monitor after you have been enrolled.   Once you have received you monitor, please review enclosed instructions.  Your monitor has already been registered assigning a specific monitor serial # to you.   Applying the monitor   Shave hair from upper left chest.   Hold abrader disc by orange tab.  Rub abrader in 40 strokes over left upper chest as indicated in your monitor instructions.   Clean area with 4 enclosed alcohol pads .  Use all pads to assure are is cleaned thoroughly.  Let dry.   Apply patch as indicated in monitor instructions.  Patch will be place under  collarbone on left side of chest with arrow pointing upward.   Rub patch adhesive wings for 2 minutes.Remove white label marked "1".  Remove white label marked "2".  Rub patch adhesive wings for 2 additional minutes.   While looking in a mirror, press and release button in center of patch.  A small green light will flash 3-4 times .  This will be your only indicator the monitor has been turned on.     Do not shower for the first 24 hours.  You may shower after the first 24 hours.   Press button if you feel a symptom. You will hear a small click.  Record Date, Time and Symptom in the Patient Log Book.   When you are ready to remove patch, follow instructions on last 2 pages of Patient Log Book.  Stick patch monitor onto last page of Patient Log Book.   Place Patient Log Book in [place] box.  Use locking tab on box and tape box closed securely.  The Orange and [employer] has [employer] on it.  Please place in mailbox as soon as possible.  Your physician should have your test results approximately 7 days after the monitor has been mailed back to [facility].   Call [facility] Customer Care at [phone] if you have questions regarding your ZIO XT patch monitor.  Call them immediately if you see an orange light blinking on your monitor.   If your monitor falls off in less than 4 days contact our Monitor department at [phone].  If your monitor becomes loose or falls off after 4 days call [employer] at [phone] for suggestions on securing your monitor.       Signed, Little Ishikawa, MD  07/22/2019 8:50 AM     [facility]

## 2019-07-22 ENCOUNTER — Other Ambulatory Visit: Payer: Self-pay

## 2019-07-22 ENCOUNTER — Ambulatory Visit (INDEPENDENT_AMBULATORY_CARE_PROVIDER_SITE_OTHER): Payer: Medicare Other | Admitting: Cardiology

## 2019-07-22 ENCOUNTER — Telehealth: Payer: Self-pay | Admitting: Radiology

## 2019-07-22 ENCOUNTER — Encounter: Payer: Self-pay | Admitting: Cardiology

## 2019-07-22 VITALS — BP 142/98 | HR 101 | Ht 72.0 in | Wt 282.8 lb

## 2019-07-22 DIAGNOSIS — R079 Chest pain, unspecified: Secondary | ICD-10-CM

## 2019-07-22 DIAGNOSIS — I493 Ventricular premature depolarization: Secondary | ICD-10-CM

## 2019-07-22 DIAGNOSIS — I10 Essential (primary) hypertension: Secondary | ICD-10-CM | POA: Diagnosis not present

## 2019-07-22 LAB — MAGNESIUM: Magnesium: 2.1 mg/dL (ref 1.6–2.3)

## 2019-07-22 LAB — BASIC METABOLIC PANEL
BUN/Creatinine Ratio: 13 (ref 9–20)
BUN: 10 mg/dL (ref 6–24)
CO2: 26 mmol/L (ref 20–29)
Calcium: 9.3 mg/dL (ref 8.7–10.2)
Chloride: 100 mmol/L (ref 96–106)
Creatinine, Ser: 0.8 mg/dL (ref 0.76–1.27)
GFR calc Af Amer: 121 mL/min/{1.73_m2} (ref >59)
GFR calc non Af Amer: 105 mL/min/{1.73_m2} (ref >59)
Glucose: 89 mg/dL (ref 65–99)
Potassium: 3.8 mmol/L (ref 3.5–5.2)
Sodium: 139 mmol/L (ref 134–144)

## 2019-07-22 NOTE — Patient Instructions (Signed)
Medication Instructions:  Your Physician recommend you continue on your current medication as directed.    *If you need a refill on your cardiac medications before your next appointment, please call your pharmacy*  Lab Work: Your physician recommends that you return for lab work today (BMP, MG)  If you have labs (blood work) drawn today and your tests are completely normal, you will receive your results only by: Marland Kitchen MyChart Message (if you have MyChart) OR . A paper copy in the mail If you have any lab test that is abnormal or we need to change your treatment, we will call you to review the results.  Testing/Procedures: Your physician has requested that you have an echocardiogram. Echocardiography is a painless test that uses sound waves to create images of your heart. It provides your doctor with information about the size and shape of your heart and how well your heart's chambers and valves are working. This procedure takes approximately one hour. There are no restrictions for this procedure. [address]  Our physician has recommended that you wear an 3  DAY ZIO-PATCH monitor. The Zio patch cardiac monitor continuously records heart rhythm data for up to 14 days, this is for patients being evaluated for multiple types heart rhythms. For the first 24 hours post application, please avoid getting the Zio monitor wet in the shower or by excessive sweating during exercise. After that, feel free to carry on with regular activities. Keep soaps and lotions away from the ZIO XT Patch.       Follow-Up: At [facility], you and your health needs are our priority.  As part of our continuing mission to provide you with exceptional heart care, we have created designated Provider Care Teams.  These Care Teams include your primary Cardiologist (physician) and Advanced Practice Providers (APPs -  Physician Assistants and Nurse Practitioners) who all work together to provide you with the  care you need, when you need it.  Your next appointment:   3 month(s)  The format for your next appointment:   In Person  Provider:   Oswaldo Milian, MD  Orange Monitor Instructions   Your physician has requested you wear your ZIO patch monitor__3_____days.   This is a single patch monitor.  [employer] supplies one patch monitor per enrollment.  Additional stickers are not available.   Please do not apply patch if you will be having a Nuclear Stress Test, Echocardiogram, Cardiac CT, MRI, or Chest Xray during the time frame you would be wearing the monitor. The patch cannot be worn during these tests.  You cannot remove and re-apply the ZIO XT patch monitor.   Your ZIO patch monitor will be sent USPS Priority mail from [facility] directly to your home address. The monitor may also be mailed to a PO BOX if home delivery is not available.   It may take 3-5 days to receive your monitor after you have been enrolled.   Once you have received you monitor, please review enclosed instructions.  Your monitor has already been registered assigning a specific monitor serial # to you.   Applying the monitor   Shave hair from upper left chest.   Hold abrader disc by orange tab.  Rub abrader in 40 strokes over left upper chest as indicated in your monitor instructions.   Clean area with 4 enclosed alcohol pads .  Use all pads to assure are is cleaned thoroughly.  Let dry.   Apply  patch as indicated in monitor instructions.  Patch will be place under collarbone on left side of chest with arrow pointing upward.   Rub patch adhesive wings for 2 minutes.Remove white label marked "1".  Remove white label marked "2".  Rub patch adhesive wings for 2 additional minutes.   While looking in a mirror, press and release button in center of patch.  A small green light will flash 3-4 times .  This will be your only indicator the monitor has been turned on.     Do not shower for the  first 24 hours.  You may shower after the first 24 hours.   Press button if you feel a symptom. You will hear a small click.  Record Date, Time and Symptom in the Patient Log Book.   When you are ready to remove patch, follow instructions on last 2 pages of Patient Log Book.  Stick patch monitor onto last page of Patient Log Book.   Place Patient Log Book in [place] box.  Use locking tab on box and tape box closed securely.  The Orange and [employer] has [employer] on it.  Please place in mailbox as soon as possible.  Your physician should have your test results approximately 7 days after the monitor has been mailed back to [facility].   Call [facility] Customer Care at [phone] if you have questions regarding your ZIO XT patch monitor.  Call them immediately if you see an orange light blinking on your monitor.   If your monitor falls off in less than 4 days contact our Monitor department at [phone].  If your monitor becomes loose or falls off after 4 days call [employer] at [phone] for suggestions on securing your monitor.

## 2019-07-22 NOTE — Telephone Encounter (Signed)
Enrolled patient for a 3 day Zio monitor to be mailed to patients home.  

## 2019-07-26 ENCOUNTER — Other Ambulatory Visit: Payer: Self-pay

## 2019-07-26 DIAGNOSIS — Z79899 Other long term (current) drug therapy: Secondary | ICD-10-CM

## 2019-07-26 MED ORDER — POTASSIUM CHLORIDE ER 20 MEQ PO TBCR: 20.0000 meq | EXTENDED_RELEASE_TABLET | Freq: Every day | ORAL | 3 refills | Status: DC

## 2019-07-26 NOTE — Telephone Encounter (Signed)
Follow Up  Patient is following up about his heart monitor. Patient states that he has not yet received his monitor. Please give a call back to give update.

## 2019-07-28 ENCOUNTER — Other Ambulatory Visit: Payer: Self-pay

## 2019-07-28 ENCOUNTER — Ambulatory Visit (HOSPITAL_COMMUNITY): Payer: Medicare Other | Attending: Cardiology

## 2019-07-28 DIAGNOSIS — Z8619 Personal history of other infectious and parasitic diseases: Secondary | ICD-10-CM | POA: Diagnosis not present

## 2019-07-28 DIAGNOSIS — Z87891 Personal history of nicotine dependence: Secondary | ICD-10-CM | POA: Insufficient documentation

## 2019-07-28 DIAGNOSIS — R42 Dizziness and giddiness: Secondary | ICD-10-CM | POA: Diagnosis not present

## 2019-07-28 DIAGNOSIS — I712 Thoracic aortic aneurysm, without rupture: Secondary | ICD-10-CM | POA: Insufficient documentation

## 2019-07-28 DIAGNOSIS — I493 Ventricular premature depolarization: Secondary | ICD-10-CM | POA: Diagnosis not present

## 2019-07-28 DIAGNOSIS — I119 Hypertensive heart disease without heart failure: Secondary | ICD-10-CM | POA: Diagnosis not present

## 2019-07-29 ENCOUNTER — Other Ambulatory Visit (INDEPENDENT_AMBULATORY_CARE_PROVIDER_SITE_OTHER): Payer: Medicare Other

## 2019-07-29 ENCOUNTER — Other Ambulatory Visit: Payer: Self-pay

## 2019-07-29 DIAGNOSIS — I493 Ventricular premature depolarization: Secondary | ICD-10-CM | POA: Diagnosis not present

## 2019-07-29 DIAGNOSIS — I712 Thoracic aortic aneurysm, without rupture, unspecified: Secondary | ICD-10-CM

## 2019-07-29 NOTE — Telephone Encounter (Signed)
Spoke to patient according to USPS monitor is out for delivery and he should have it by 6pm tonight

## 2019-08-03 ENCOUNTER — Other Ambulatory Visit: Payer: Self-pay

## 2019-08-03 ENCOUNTER — Encounter: Payer: Self-pay | Admitting: Cardiovascular Disease

## 2019-08-03 ENCOUNTER — Encounter: Payer: Self-pay | Admitting: Cardiology

## 2019-08-03 ENCOUNTER — Ambulatory Visit (INDEPENDENT_AMBULATORY_CARE_PROVIDER_SITE_OTHER): Payer: Medicare Other | Admitting: Cardiology

## 2019-08-03 VITALS — BP 140/82 | HR 68 | Temp 97.9°F | Ht 72.0 in | Wt 283.0 lb

## 2019-08-03 DIAGNOSIS — I10 Essential (primary) hypertension: Secondary | ICD-10-CM

## 2019-08-03 DIAGNOSIS — Z79899 Other long term (current) drug therapy: Secondary | ICD-10-CM

## 2019-08-03 DIAGNOSIS — I493 Ventricular premature depolarization: Secondary | ICD-10-CM | POA: Diagnosis not present

## 2019-08-03 DIAGNOSIS — R079 Chest pain, unspecified: Secondary | ICD-10-CM

## 2019-08-03 DIAGNOSIS — I712 Thoracic aortic aneurysm, without rupture, unspecified: Secondary | ICD-10-CM

## 2019-08-03 LAB — BASIC METABOLIC PANEL
BUN/Creatinine Ratio: 14 (ref 9–20)
BUN: 10 mg/dL (ref 6–24)
CO2: 28 mmol/L (ref 20–29)
Calcium: 9.2 mg/dL (ref 8.7–10.2)
Chloride: 100 mmol/L (ref 96–106)
Creatinine, Ser: 0.73 mg/dL — ABNORMAL LOW (ref 0.76–1.27)
GFR calc Af Amer: 126 mL/min/{1.73_m2} (ref >59)
GFR calc non Af Amer: 109 mL/min/{1.73_m2} (ref >59)
Glucose: 92 mg/dL (ref 65–99)
Potassium: 3.7 mmol/L (ref 3.5–5.2)
Sodium: 140 mmol/L (ref 134–144)

## 2019-08-03 MED ORDER — CARVEDILOL 6.25 MG PO TABS: 6.2500 mg | ORAL_TABLET | Freq: Two times a day (BID) | ORAL | 3 refills | Status: DC

## 2019-08-03 NOTE — Patient Instructions (Addendum)
Medication Instructions: Start Carvedilol 6.25mg  1 tab twice a day  If you need a refill on your cardiac medications before your next appointment, please call your pharmacy*  Lab Work: none at this time.    Testing/Procedures: Your physician has requested that you have en exercise stress myoview. For further information please visit [email or web address]. Please follow instruction sheet, as given.    Follow-Up: Follow up in the hypertension clinic in 2 weeks.   At [facility], you and your health needs are our priority.  As part of our continuing mission to provide you with exceptional heart care, we have created designated Provider Care Teams.  These Care Teams include your primary Cardiologist (physician) and Advanced Practice Providers (APPs -  Physician Assistants and Nurse Practitioners) who all work together to provide you with the care you need, when you need it.  Your next appointment:   October 21, 2019  The format for your next appointment:   Either In Person or Virtual  Provider:   Oswaldo Milian, MD  Other Instructions  Go to the emergency room if you have chest pain in which lasts longer that 5 minutes.   You are scheduled for a Myocardial Perfusion Imaging Study.  Please arrive 15 minutes prior to your appointment time for registration and insurance purposes.  The test will take approximately 3 to 4 hours to complete; you may bring reading material.  If someone comes with you to your appointment, they will need to remain in the main lobby due to limited space in the testing area. **If you are pregnant or breastfeeding, please notify the nuclear lab prior to your appointment**  How to prepare for your Myocardial Perfusion Test: . Do not eat or drink 3 hours prior to your test, except you may have water. . Do not consume products containing caffeine (regular or decaffeinated) 12 hours prior to your test. (ex: coffee, chocolate, sodas, tea). . Do bring a list of your  current medications with you.  If not listed below, you may take your medications as normal. . Do wear comfortable clothes (no dresses or overalls) and walking shoes, tennis shoes preferred (No heels or open toe shoes are allowed). . Do NOT wear cologne, perfume, aftershave, or lotions (deodorant is allowed). . If these instructions are not followed, your test will have to be rescheduled.  Please report to [place], [address] for your test.  If you have questions or concerns about your appointment, you can call the Nuclear Lab at [phone].  If you cannot keep your appointment, please provide 24 hours notification to the Nuclear Lab, to avoid a possible $50 charge to your account.

## 2019-08-03 NOTE — Progress Notes (Signed)
Cardiology Office Note:    Date:  08/03/2019   ID:  Ricky [name], DOB [birth year], MRN [id number]  PCP:  Maryagnes Amos, FNP  Cardiologist:  No primary care provider on file.  Electrophysiologist:  None   Referring MD: Maryagnes Amos   Chief Complaint  Patient presents with  . Follow-up  . Chest Pain    History of Present Illness:    Ricky [name] is a 48 y.o. male with a hx of hypertension, thrombocytopenia who presents for follow-up.  He was initially seen on 07/22/2019 after he was referred by Caryn Bee, FNP for an evaluation of PVCs.  He reported that he was diagnosed with Covid at the end of October.  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  Reports that he has had complete recovery and feels back to his normal self.  However had been having issues with cramping in his legs and chest.  Reports the cramps are in far left side of his chest under his arm.  Had an ED visit on 07/10/2019 with chest pain/body aches and lightheadedness.  EKG showed bigeminy.  High-sensitivity troponins were unremarkable.  Potassium was mildly reduced, he was given supplementation in the hospital.  Since discharge he has been drinking Gatorade regularly, and reports that he no longer is having cramping.  He denies any palpitations, lower extremity edema, lightheadedness, syncope.  Reports that he exercises for walking up to 1 mile.  Is limited by pain in his knees.  He denies any exertional chest pain or dyspnea.  He quit smoking 1 year ago.  Smoked half a pack per day for 6-7 years.  He denies any family history of heart disease.   TTE was done on 07/28/2019 which showed low normal systolic function (EF 50 to 55%), septal hypokinesis.  Also was notable for ascending aortic aneurysm measuring up to 48 mm.  He reports that he had an episode of chest pain this morning.  Described pain on far left side of his chest.  Lasted for about 1.52 minutes and resolved.  Also  reports what he describes as soreness in the center of his chest.  States that he has no pain at rest, but if he takes a deep breath or presses on his chest it causes pain.  Reports that he has walked to this morning he does not note any exertional chest pain.  Denies any shortness of breath.      Past Medical History:  Diagnosis Date  . 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 Hypertension   . Thrombocytopenia (HCC) 07/07/2012   138,000 08/26/11; 98,000 06/29/12    Past Surgical History:  Procedure Laterality Date  . ADENOIDECTOMY    . ANTERIOR FUSION CERVICAL SPINE    . APPENDECTOMY    . HERNIA REPAIR    . IRRIGATION AND DEBRIDEMENT SEBACEOUS CYST     on scalp  . KNEE ARTHROSCOPY     x2  . LUMBAR FUSION    . POSTERIOR FUSION CERVICAL SPINE    . TONSILLECTOMY      Current Medications: Current Meds  Medication Sig  . amLODipine (NORVASC) 10 MG tablet Take by mouth.  . Azilsartan-Chlorthalidone 40-25 MG TABS Take by mouth.  . cyclobenzaprine (FLEXERIL) 10 MG tablet Take 1 tablet (10 mg total) by mouth 2 (two) times daily as needed for muscle spasms.  . potassium chloride 20 MEQ TBCR Take 20 mEq by  mouth daily.     Allergies:   Patient has no known allergies.   Social History   Socioeconomic History  . Marital status: Single    Spouse name: Not on file  . Number of children: Not on file  . Years of education: Not on file  . Highest education level: Not on file  Occupational History  . Not on file  Tobacco Use  . 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 Smokeless tobacco: Never Used  Substance and Sexual Activity  . Alcohol use: No  . Drug use: No  . Sexual activity: Not on file  Other Topics Concern  . Not on file  Social History Narrative  . Not on file   Social Determinants of Health   Financial Resource Strain:   . Difficulty of Paying Living Expenses: Not on file  Food Insecurity:   . Worried About Programme researcher, broadcasting/film/video in  the Last Year: Not on file  . Ran Out of Food in the Last Year: Not on file  Transportation Needs:   . Lack of Transportation (Medical): Not on file  . Lack of Transportation (Non-Medical): Not on file  Physical Activity:   . Days of Exercise per Week: Not on file  . Minutes of Exercise per Session: Not on file  Stress:   . Feeling of Stress : Not on file  Social Connections:   . Frequency of Communication with Friends and Family: Not on file  . Frequency of Social Gatherings with Friends and Family: Not on file  . Attends Religious Services: Not on file  . Active Member of Clubs or Organizations: Not on file  . Attends Banker Meetings: Not on file  . Marital Status: Not on file     Family History: No family history of heart disease  ROS:   Please see the history of present illness.    All other systems reviewed and are negative.  EKGs/Labs/Other Studies Reviewed:    The following studies were reviewed today:  EKG:  EKG is ordered today.  The ekg ordered today demonstrates sinus rhythm, rate 69, bigeminy, sub-1 mm ST depression in V5-6.    Recent Labs: 07/10/2019: ALT 23; Hemoglobin 15.1; Platelets 157 07/22/2019: BUN 10; Creatinine, Ser 0.80; Magnesium 2.1; Potassium 3.8; Sodium 139  Recent Lipid Panel No results found for: CHOL, TRIG, HDL, CHOLHDL, VLDL, LDLCALC, LDLDIRECT  Physical Exam:    VS:  BP 140/82 (BP Location: Left Arm, Patient Position: Sitting, Cuff Size: Large)   Pulse 68   Temp 97.9 F (36.6 C)   Ht 6' (1.829 m)   Wt 283 lb (128.4 kg)   BMI 38.38 kg/m     Wt Readings from Last 3 Encounters:  08/03/19 283 lb (128.4 kg)  07/22/19 282 lb 12.8 oz (128.3 kg)  11/26/14 290 lb (131.5 kg)     GEN:  Well nourished, well developed in no acute distress HEENT: Normal NECK: No JVD LYMPHATICS: No lymphadenopathy CARDIAC: RRR, no murmurs, rubs, gallops RESPIRATORY:  Clear to auscultation without rales, wheezing or rhonchi  ABDOMEN: Soft,  non-tender, non-distended MUSCULOSKELETAL:  No edema; No deformity  SKIN: Warm and dry NEUROLOGIC:  Alert and oriented x 3 PSYCHIATRIC:  Normal affect   TTE 07/28/19:  1. Left ventricular ejection fraction, by visual estimation, is 50 to 55%. The left ventricle has low normal function. There is mildly increased left ventricular hypertrophy.  2. Mildly dilated left ventricular internal cavity size.  3. The left ventricle demonstrates  regional wall motion abnormalities. Septal hypokinesis  4. Global right ventricle has normal systolic function.The right ventricular size is normal. No increase in right ventricular wall thickness.  5. Left atrial size was normal.  6. Right atrial size was normal.  7. The mitral valve is normal in structure. No evidence of mitral valve regurgitation.  8. The tricuspid valve is normal in structure. Tricuspid valve regurgitation is not demonstrated.  9. The aortic valve is tricuspid. Aortic valve regurgitation is not visualized. No evidence of aortic valve sclerosis or stenosis. 10. The inferior vena cava is normal in size with greater than 50% respiratory variability, suggesting right atrial pressure of 3 mmHg. 11. Aneurysm of the ascending aorta, measuring 48 mm.  ASSESSMENT:    1. Chest pain of uncertain etiology   2. Frequent PVCs   3. Essential hypertension   4. Thoracic aortic aneurysm without rupture (HCC)    PLAN:    In order of problems listed above:  Chest pain: Atypical in description, as describes left-sided pain occurring at rest.  Given frequent PVCs and hypokinesis seen on TTE, warrants evaluation for ischemia.  Will check Lexiscan Myoview.  Cautioned that if he has any resting chest pain lasting longer than 5 minutes that he should go to the ED.  PVCs: Frequent PVCs on EKG, has been in bigeminy.  He is asymptomatic.  Hypokalemia from chlorthalidone use may be contributing, started on potassium supplements, BMET drawn today.  TTE shows low  normal systolic function (EF 50 to 55%), septal hypokinesis.   -Will check Zio patch x3 days to quantify PVC burden. -Lexiscan Myoview as above to rule out ischemia  Hypertension: On amlodipine 10 mg daily and azilsartan-chlorthalidone 40-25 mg.  BP elevated in clinic today.  Given aortic aneurysm, recommend more aggressive blood pressure control, with goal SBP less than 120.  Will start carvedilol 6.25 mg twice daily and follow-up in pharmacy clinic in 2 weeks for further titration of antihypertensives  Aortic aneurysm: 4.8 cm ascending aorta on TTE.  CTA chest planned for 12/18  RTC in 2 months  Medication Adjustments/Labs and Tests Ordered: Current medicines are reviewed at length with the patient today.  Concerns regarding medicines are outlined above.  Orders Placed This Encounter  Procedures  . MYOCARDIAL PERFUSION IMAGING  . EKG 12-Lead   Meds ordered this encounter  Medications  . carvedilol (COREG) 6.25 MG tablet    Sig: Take 1 tablet (6.25 mg total) by mouth 2 (two) times daily.    Dispense:  180 tablet    Refill:  3    Patient Instructions  Medication Instructions: Start Carvedilol 6.25mg  1 tab twice a day  If you need a refill on your cardiac medications before your next appointment, please call your pharmacy*  Lab Work: none at this time.    Testing/Procedures: Your physician has requested that you have en exercise stress myoview. For further information please visit [email or web address]. Please follow instruction sheet, as given.     Follow-Up: Follow up in the hypertension clinic in 2 weeks.   At [facility], you and your health needs are our priority.  As part of our continuing mission to provide you with exceptional heart care, we have created designated Provider Care Teams.  These Care Teams include your primary Cardiologist (physician) and Advanced Practice Providers (APPs -  Physician Assistants and Nurse Practitioners) who all work together to provide you  with the care you need, when you need it.  Your next appointment:   October 21, 2019  The format for your next appointment:   Either In Person or Virtual  Provider:   Oswaldo Milian, MD  Other Instructions  Go to the emergency room if you have chest pain in which lasts longer that 5 minutes.         Signed, Donato Heinz, MD  08/03/2019 11:38 AM    [facility]

## 2019-08-05 ENCOUNTER — Ambulatory Visit (HOSPITAL_COMMUNITY)
Admission: RE | Admit: 2019-08-05 | Discharge: 2019-08-05 | Disposition: A | Discharge status: Home / Self Care | Payer: Medicare Other | Source: Ambulatory Visit | Attending: Cardiology | Admitting: Cardiology

## 2019-08-05 ENCOUNTER — Other Ambulatory Visit: Payer: Self-pay

## 2019-08-05 DIAGNOSIS — I712 Thoracic aortic aneurysm, without rupture, unspecified: Secondary | ICD-10-CM

## 2019-08-16 ENCOUNTER — Telehealth (HOSPITAL_COMMUNITY): Payer: Self-pay | Admitting: *Deleted

## 2019-08-16 MED ORDER — IOHEXOL 350 MG/ML SOLN
100.0000 mL | Freq: Once | INTRAVENOUS | Status: AC | PRN
  Administered 2019-08-16: 100 mL via INTRAVENOUS

## 2019-08-16 NOTE — Progress Notes (Deleted)
Cardiology Office Note   Date:  08/16/2019   ID:  THERAN VANDERGRIFT, DOB Dec 03, 1970, MRN [id number]  PCP:  Maryagnes Amos, FNP  Cardiologist:  Dr. Bjorn Pippin  No chief complaint on file.    History of Present Illness: Ricky [name] is a 48 y.o. male who presents for ongoing assessment and management of hypertension, with complaints of chest pain, history of SARS-Covid the end of October 2020.  Other history includes thrombocytopenia  Most recent echocardiogram on 07/28/2019 showed normal LV systolic function of 50% to 55% with septal hypokinesis.  He also had an ascending aortic aneurysm measuring up to 48 mm.  He was last seen by Dr. Gaynelle Arabian on 08/03/2019 for complaints of chest pain.  Dr. Gaynelle Arabian felt it was atypical in etiology described as left-sided chest pain occurring at rest.  He was scheduled for a Lexiscan Myoview to evaluate further as he did have some hypokinesis seen on transthoracic echocardiogram.  Due to complaints of frequent PVCs a Zio patch for 3 days was placed to evaluate frequency and etiology of arrhythmia.  In the setting of aortic aneurysm he was continued on amlodipine 10 mg daily along with azilsartan/chlorthalidone 40/25 mg, and carvedilol 6.25 mg twice daily was ordered for more aggressive blood pressure control.  His goal for blood pressure was to have a systolic blood pressure less than 120 in the setting of AAA.  He was return to the office in 2 months.  Past Medical History:  Diagnosis Date  . 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 Hypertension   . Thrombocytopenia (HCC) 07/07/2012   138,000 08/26/11; 98,000 06/29/12    Past Surgical History:  Procedure Laterality Date  . ADENOIDECTOMY    . ANTERIOR FUSION CERVICAL SPINE    . APPENDECTOMY    . HERNIA REPAIR    . IRRIGATION AND DEBRIDEMENT SEBACEOUS CYST     on scalp  . KNEE ARTHROSCOPY     x2  . LUMBAR FUSION    . POSTERIOR FUSION CERVICAL SPINE    .  TONSILLECTOMY       Current Outpatient Medications  Medication Sig Dispense Refill  . amLODipine (NORVASC) 10 MG tablet Take by mouth.    . Azilsartan-Chlorthalidone 40-25 MG TABS Take by mouth.    . carvedilol (COREG) 6.25 MG tablet Take 1 tablet (6.25 mg total) by mouth 2 (two) times daily. 180 tablet 3  . cyclobenzaprine (FLEXERIL) 10 MG tablet Take 1 tablet (10 mg total) by mouth 2 (two) times daily as needed for muscle spasms. 20 tablet 0  . potassium chloride 20 MEQ TBCR Take 20 mEq by mouth daily. 90 tablet 3   No current facility-administered medications for this visit.    Allergies:   Patient has no known allergies.    Social History:  The patient  reports that he has quit smoking. He has never used smokeless tobacco. He reports that he does not drink alcohol or use drugs.   Family History:  The patient's family history is not on file.    ROS: All other systems are reviewed and negative. Unless otherwise mentioned in H&P    PHYSICAL EXAM: VS:  There were no vitals taken for this visit. , BMI There is no height or weight on file to calculate BMI. GEN: Well nourished, well developed, in no acute distress HEENT: normal Neck: no JVD, carotid bruits, or masses Cardiac: ***RRR; no murmurs, rubs, or gallops,no edema  Respiratory:  Clear to auscultation bilaterally, normal work of breathing GI: soft, nontender, nondistended, + BS MS: no deformity or atrophy Skin: warm and dry, no rash Neuro:  Strength and sensation are intact Psych: euthymic mood, full affect   EKG:  EKG {ACTION; IS/IS [id number]} ordered today. The ekg ordered today demonstrates ***   Recent Labs: 07/10/2019: ALT 23; Hemoglobin 15.1; Platelets 157 07/22/2019: Magnesium 2.1 08/03/2019: BUN 10; Creatinine, Ser 0.73; Potassium 3.7; Sodium 140    Lipid Panel No results found for: CHOL, TRIG, HDL, CHOLHDL, VLDL, LDLCALC, LDLDIRECT    Wt Readings from Last 3 Encounters:  08/03/19 283 lb (128.4 kg)    07/22/19 282 lb 12.8 oz (128.3 kg)  11/26/14 290 lb (131.5 kg)      Other studies Reviewed: Additional studies/ records that were reviewed today include: ***. Review of the above records demonstrates: ***   ASSESSMENT AND PLAN:  1.  ***   Current medicines are reviewed at length with the patient today.    Labs/ tests ordered today include: *** Phill Myron. [place] Pugh, ANP, AACC   08/16/2019 1:44 PM    [facility] [place] [address] Office [phone] Fax [phone]  Notice: This dictation was prepared with Dragon dictation along with smaller phrase technology. Any transcriptional errors that result from this process are unintentional and may not be corrected upon review.

## 2019-08-16 NOTE — Telephone Encounter (Signed)
Close encounter 

## 2019-08-17 ENCOUNTER — Ambulatory Visit: Payer: Medicare Other | Admitting: Adult Health

## 2019-08-17 ENCOUNTER — Other Ambulatory Visit: Payer: Self-pay

## 2019-08-17 ENCOUNTER — Ambulatory Visit (HOSPITAL_COMMUNITY)
Admission: RE | Admit: 2019-08-17 | Discharge: 2019-08-17 | Disposition: A | Discharge status: Home / Self Care | Payer: Medicare Other | Source: Ambulatory Visit | Attending: Cardiovascular Disease | Admitting: Cardiovascular Disease

## 2019-08-17 DIAGNOSIS — R079 Chest pain, unspecified: Secondary | ICD-10-CM | POA: Diagnosis present

## 2019-08-17 MED ORDER — REGADENOSON 0.4 MG/5ML IV SOLN
0.4000 mg | Freq: Once | INTRAVENOUS | Status: AC
  Administered 2019-08-17: 0.4 mg via INTRAVENOUS

## 2019-08-17 MED ORDER — TECHNETIUM TC 99M TETROFOSMIN IV KIT
29.1000 | PACK | Freq: Once | INTRAVENOUS | Status: AC | PRN
  Administered 2019-08-17: 29.1 via INTRAVENOUS
  Filled 2019-08-17: qty 30

## 2019-08-17 MED ORDER — AMINOPHYLLINE 25 MG/ML IV SOLN
75.0000 mg | Freq: Once | INTRAVENOUS | Status: AC
  Administered 2019-08-17: 75 mg via INTRAVENOUS

## 2019-08-18 ENCOUNTER — Ambulatory Visit (INDEPENDENT_AMBULATORY_CARE_PROVIDER_SITE_OTHER): Payer: Medicare Other | Admitting: Pharmacist

## 2019-08-18 ENCOUNTER — Ambulatory Visit (HOSPITAL_COMMUNITY)
Admission: RE | Admit: 2019-08-18 | Discharge: 2019-08-18 | Disposition: A | Discharge status: Home / Self Care | Payer: Medicare Other | Source: Ambulatory Visit | Attending: Cardiovascular Disease | Admitting: Cardiovascular Disease

## 2019-08-18 DIAGNOSIS — I10 Essential (primary) hypertension: Secondary | ICD-10-CM

## 2019-08-18 LAB — MYOCARDIAL PERFUSION IMAGING
Peak HR: 100 {beats}/min
Rest HR: 75 {beats}/min
SDS: 1
SRS: 0
SSS: 1
TID: 1

## 2019-08-18 MED ORDER — TECHNETIUM TC 99M TETROFOSMIN IV KIT
30.8000 | PACK | Freq: Once | INTRAVENOUS | Status: AC | PRN
  Administered 2019-08-18: 30.8 via INTRAVENOUS

## 2019-08-18 MED ORDER — BLOOD PRESSURE CUFF MISC: 0 refills | Status: AC

## 2019-08-18 NOTE — Progress Notes (Signed)
Patient ID: Ricky [name]                 DOB: 01/28/1971                      MRN: [id number]     HPI: Ricky [name] is a 48 y.o. male referred by Dr. Gardiner Rhyme to HTN clinic. PMH is significant for hypertension, thrombocytopenia, bigeminy, PVC, ascending aortic aneurysm, hypokalemia  At last visit with Dr. Gardiner Rhyme patient was started on carvedilol 6.25mg  twice a day. BMP showed a K of 3.7 and Mg of 2.1. They plan to do a Lexiscan to rule out ischemia and a zio patch for arrhythmias.   Patient presents today for initial appointment in HTN clinic for blood pressure check. Patient denies dizziness, lightheadedness, headache, blurred vision, SOB and swelling. He was a little concerned about his blood pressure dropping into the 90's after his stress test yesterday, but states he felt fine. He bought a blood pressure monitor, but the cuff is too small. He tried to find one at a DME store but they didn't have a larger size.   His blood pressure before his stress test was 113/65 and was 110/76 after (however they did give him saline for this to come up some).   Current HTN meds: amlodipine 10mg  daily, azilsartan-chlorthalidone 40-25mg  daily, carvedilol 6.25mg  twice a day Previously tried: olmesartan-amlodipine-HCTZ (unknown) BP goal: <120/80 per Dr. Gardiner Rhyme due to aortic aneurysm   Family History: No family history of heart disease  Social History: former smoker  Home BP readings: has been unable to find larger cuff size  Wt Readings from Last 3 Encounters:  08/17/19 283 lb (128.4 kg)  08/03/19 283 lb (128.4 kg)  07/22/19 282 lb 12.8 oz (128.3 kg)   BP Readings from Last 3 Encounters:  08/03/19 140/82  07/22/19 (!) 142/98  07/10/19 (!) 131/99   Pulse Readings from Last 3 Encounters:  08/03/19 68  07/22/19 (!) 101  07/10/19 63    Renal function: Estimated Creatinine Clearance: 154.7 mL/min (A) (by C-G formula based on SCr of 0.73 mg/dL (L)).  Past Medical History:  Diagnosis  Date  . 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 Hypertension   . Thrombocytopenia ([place]) 07/07/2012   138,000 08/26/11; 98,000 06/29/12    Current Outpatient Medications on File Prior to Visit  Medication Sig Dispense Refill  . amLODipine (NORVASC) 10 MG tablet Take by mouth.    . Azilsartan-Chlorthalidone 40-25 MG TABS Take by mouth.    . carvedilol (COREG) 6.25 MG tablet Take 1 tablet (6.25 mg total) by mouth 2 (two) times daily. 180 tablet 3  . cyclobenzaprine (FLEXERIL) 10 MG tablet Take 1 tablet (10 mg total) by mouth 2 (two) times daily as needed for muscle spasms. 20 tablet 0  . potassium chloride 20 MEQ TBCR Take 20 mEq by mouth daily. 90 tablet 3   No current facility-administered medications on file prior to visit.    No Known Allergies  There were no vitals taken for this visit.   Assessment/Plan:  1. Hypertension - Blood pressure in clinic is at goal of <120/80. He denies any symptoms of hypotension. Continue amlodipine 10mg  daily, azilsartan-chlorthalidone 40-25mg  daily, carvedilol 6.25mg  twice a day. I have measured his arm and he will need an Adult XL blood pressure cuff. Patient is unsure of his blood pressure machine, but we did find a few XL cuffs on [employer] that  he could purchase. I also provided him an RX per request incase maybe a pharmacy can order for him. Advised patient to check his blood pressure once a day once he gets a cuff that works. Follow up in HTN as needed.   Thank you  Olene Floss, Pharm.D, BCPS, CPP [facility]  [address], [place], [place] [address]  Phone: [phone]; Fax: [phone]

## 2019-08-18 NOTE — Patient Instructions (Addendum)
It was a pleasure to meet you today!  Please continue taking amlodipine 10mg  daily, azilsartan-chlorthalidone 40-25mg  daily and carvedilol 6.25mg  twice a day.  Check your blood pressure at home. Look on [facility] for an XL blood pressure cuff.  Please call us at [phone] with any questions or concerns  Your blood pressure goal is <120/80

## 2019-08-22 ENCOUNTER — Telehealth: Payer: Self-pay | Admitting: Cardiology

## 2019-08-22 NOTE — Telephone Encounter (Signed)
Pt called complaining with episode of sharp chest pain after having myoview as well as another episode Sat Pt also noted felt like was going to pass out , broke out into a sweat, and legs felt heavy Per pt EMS was called  B/P initially was 125/109 and later was 118/81 HR 80 R 16 did not go to hospital Pt is requesting an appt also was wanting results of monitor Will forward to Dr Bjorn Pippin for review and recommendations.Zack Seal

## 2019-08-22 NOTE — Telephone Encounter (Signed)
Appointment scheduled for tomorrow 1/10.

## 2019-08-22 NOTE — Telephone Encounter (Signed)
Left message to call office

## 2019-08-22 NOTE — Telephone Encounter (Signed)
New message   Pt c/o of Chest Pain: STAT if CP now or developed within 24 hours  1. Are you having CP right now? No   2. Are you experiencing any other symptoms (ex. SOB, nausea, vomiting, sweating)?no,patient states that he experienced these symptoms on Saturday  3. How long have you been experiencing CP? With the last 3 to 4 days   4. Is your CP continuous or coming and going? Coming and going   5. Have you taken Nitroglycerin?no  ?

## 2019-08-22 NOTE — Telephone Encounter (Signed)
Could we schedule him an appointment for this week to go over everything?  Could we schedule him for tomorrow?

## 2019-08-23 ENCOUNTER — Ambulatory Visit (INDEPENDENT_AMBULATORY_CARE_PROVIDER_SITE_OTHER): Payer: Medicare Other | Admitting: Cardiology

## 2019-08-23 ENCOUNTER — Other Ambulatory Visit: Payer: Self-pay

## 2019-08-23 ENCOUNTER — Encounter: Payer: Self-pay | Admitting: Cardiology

## 2019-08-23 VITALS — BP 125/95 | HR 72 | Temp 97.0°F | Ht 72.0 in | Wt 277.8 lb

## 2019-08-23 DIAGNOSIS — I10 Essential (primary) hypertension: Secondary | ICD-10-CM

## 2019-08-23 DIAGNOSIS — I493 Ventricular premature depolarization: Secondary | ICD-10-CM

## 2019-08-23 DIAGNOSIS — R0683 Snoring: Secondary | ICD-10-CM | POA: Diagnosis not present

## 2019-08-23 DIAGNOSIS — I712 Thoracic aortic aneurysm, without rupture, unspecified: Secondary | ICD-10-CM

## 2019-08-23 DIAGNOSIS — R079 Chest pain, unspecified: Secondary | ICD-10-CM

## 2019-08-23 LAB — BASIC METABOLIC PANEL
BUN/Creatinine Ratio: 18 (ref 9–20)
BUN: 14 mg/dL (ref 6–24)
CO2: 27 mmol/L (ref 20–29)
Calcium: 9.4 mg/dL (ref 8.7–10.2)
Chloride: 98 mmol/L (ref 96–106)
Creatinine, Ser: 0.79 mg/dL (ref 0.76–1.27)
GFR calc Af Amer: 122 mL/min/{1.73_m2} (ref >59)
GFR calc non Af Amer: 105 mL/min/{1.73_m2} (ref >59)
Glucose: 106 mg/dL — ABNORMAL HIGH (ref 65–99)
Potassium: 3.6 mmol/L (ref 3.5–5.2)
Sodium: 137 mmol/L (ref 134–144)

## 2019-08-23 NOTE — Progress Notes (Signed)
Cardiology Office Note:    Date:  08/23/2019   ID:  Ricky [name], DOB [birth year], MRN [id number]  PCP:  Maryagnes Amos, FNP  Cardiologist:  No primary care provider on file.  Electrophysiologist:  None   Referring MD: Maryagnes Amos   Chief Complaint  Patient presents with  . Chest Pain    History of Present Illness:    Ricky [name] is a 49 y.o. male with a hx of hypertension, thrombocytopenia who presents for follow-up.  He was initially seen on 07/22/2019 after he was referred by Ricky Bee, FNP for an evaluation of PVCs.  He reported that he was diagnosed with Covid at the end of October.  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  Reports that he has had complete recovery and feels back to his normal self.  However had been having issues with cramping in his legs and chest.  Reports the cramps are in far left side of his chest under his arm.  Had an ED visit on 07/10/2019 with chest pain/body aches and lightheadedness.  EKG showed bigeminy.  High-sensitivity troponins were unremarkable.  Potassium was mildly reduced, he was given supplementation in the hospital.  Since discharge he has been drinking Gatorade regularly, and reports that he no longer is having cramping.  He denies any palpitations, lower extremity edema, lightheadedness, syncope.  Reports that he exercises by walking up to 1 mile.  Is limited by pain in his knees.  He denies any exertional chest pain or dyspnea.  He quit smoking 1 year ago.  Smoked half a pack per day for 6-7 years.  He denies any family history of heart disease.   TTE was done on 07/28/2019 which showed low normal systolic function (EF 50 to 55%), septal hypokinesis.  Also was notable for ascending aortic aneurysm measuring up to 48 mm.  Underwent CTA chest on 08/16/2019, which showed ascending aortic aneurysm measuring up to 3mm.  Cardiac monitor showed frequent PVCs (23% of beats) and 120 episodes of SVT,  lasting up to 1 minute.  Lexiscan Myoview on 08/18/2019 showed no evidence of ischemia.  Reports that he has been having episodes where he feels lightheaded. Did not check BP during episodes.  He has ordered a blood pressure monitor but has not arrived yet.  Also reports that he has been having burning sensation in chest.    Past Medical History:  Diagnosis Date  . 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 Hypertension   . Thrombocytopenia (HCC) 07/07/2012   138,000 08/26/11; 98,000 06/29/12    Past Surgical History:  Procedure Laterality Date  . ADENOIDECTOMY    . ANTERIOR FUSION CERVICAL SPINE    . APPENDECTOMY    . HERNIA REPAIR    . IRRIGATION AND DEBRIDEMENT SEBACEOUS CYST     on scalp  . KNEE ARTHROSCOPY     x2  . LUMBAR FUSION    . POSTERIOR FUSION CERVICAL SPINE    . TONSILLECTOMY      Current Medications: Current Meds  Medication Sig  . amLODipine (NORVASC) 10 MG tablet Take by mouth.  . Azilsartan-Chlorthalidone 40-25 MG TABS Take by mouth.  . Blood Pressure Monitoring (BLOOD PRESSURE CUFF) MISC XL Adult blood pressure cuff  . carvedilol (COREG) 6.25 MG tablet Take 1 tablet (6.25 mg total) by mouth 2 (two) times daily.  . cyclobenzaprine (FLEXERIL) 10 MG tablet Take 1 tablet (10 mg  total) by mouth 2 (two) times daily as needed for muscle spasms.  . potassium chloride 20 MEQ TBCR Take 20 mEq by mouth daily.     Allergies:   Patient has no known allergies.   Social History   Socioeconomic History  . Marital status: Single    Spouse name: Not on file  . Number of children: Not on file  . Years of education: Not on file  . Highest education level: Not on file  Occupational History  . Not on file  Tobacco Use  . 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 Smokeless tobacco: Never Used  Substance and Sexual Activity  . Alcohol use: No  . Drug use: No  . Sexual activity: Not on file  Other Topics Concern  . Not on file  Social  History Narrative  . Not on file   Social Determinants of Health   Financial Resource Strain:   . Difficulty of Paying Living Expenses: Not on file  Food Insecurity:   . Worried About Programme researcher, broadcasting/film/video in the Last Year: Not on file  . Ran Out of Food in the Last Year: Not on file  Transportation Needs:   . Lack of Transportation (Medical): Not on file  . Lack of Transportation (Non-Medical): Not on file  Physical Activity:   . Days of Exercise per Week: Not on file  . Minutes of Exercise per Session: Not on file  Stress:   . Feeling of Stress : Not on file  Social Connections:   . Frequency of Communication with Friends and Family: Not on file  . Frequency of Social Gatherings with Friends and Family: Not on file  . Attends Religious Services: Not on file  . Active Member of Clubs or Organizations: Not on file  . Attends Banker Meetings: Not on file  . Marital Status: Not on file     Family History: No family history of heart disease  ROS:   Please see the history of present illness.    All other systems reviewed and are negative.  EKGs/Labs/Other Studies Reviewed:    The following studies were reviewed today:  EKG:  EKG is ordered today.  The ekg ordered today demonstrates sinus rhythm, rate 72, no PVCs  Recent Labs: 07/10/2019: ALT 23; Hemoglobin 15.1; Platelets 157 07/22/2019: Magnesium 2.1 08/03/2019: BUN 10; Creatinine, Ser 0.73; Potassium 3.7; Sodium 140  Recent Lipid Panel No results found for: CHOL, TRIG, HDL, CHOLHDL, VLDL, LDLCALC, LDLDIRECT  Physical Exam:    VS:  BP (!) 125/95   Pulse 72   Temp (!) 97 F (36.1 C)   Ht 6' (1.829 m)   Wt 277 lb 12.8 oz (126 kg)   SpO2 97%   BMI 37.68 kg/m     Wt Readings from Last 3 Encounters:  08/23/19 277 lb 12.8 oz (126 kg)  08/17/19 283 lb (128.4 kg)  08/03/19 283 lb (128.4 kg)     GEN:  Well nourished, well developed in no acute distress HEENT: Normal NECK: No JVD LYMPHATICS: No  lymphadenopathy CARDIAC: RRR, no murmurs, rubs, gallops RESPIRATORY:  Clear to auscultation without rales, wheezing or rhonchi  ABDOMEN: Soft, non-tender, non-distended MUSCULOSKELETAL:  No edema; No deformity  SKIN: Warm and dry NEUROLOGIC:  Alert and oriented x 3 PSYCHIATRIC:  Normal affect    Cardiac monitor 08/21/19:  Frequent ventricular ectopy (23% of beats)  120 episodes of SVT, longest lasting 59 seconds at 152 bpm  One 4 beat episode of  NSVT  Patient triggered events corresponded to sinus rhythm with ventricular ectopy, incluging bigeminy   4 days of data recorded on Zio monitor. Patient had a min HR of 46 bpm, max HR of 207 bpm, and avg HR of 77 bpm. Predominant underlying rhythm was Sinus Rhythm. No atrial fibrillation, high degree block, or pauses noted. There was one 4 beat run of NSVT and 120 runs of SVT, longest lasting 59 seconds at 152 bpm.  Isolated atrial was ectopy was rare (<1%).  Very frequent ventricular ectopy (23% of beats).  Episodes of bigeminy (longest lasting 132 seconds) and trigeminy (longest lasting 98 seconds).  There were 2 triggered events, corresponding to sinus rhythm with ventricular bigeminy.   No significant arrhythmias detected.  CTA chest 08/16/19: Aneurysmal dilatation of the ascending thoracic aorta up to 47 mm. Recommend semi-annual imaging followup by CTA or MRA and referral to cardiothoracic surgery if not already obtained. This recommendation follows 2010 ACCF/AHA/AATS/ACR/ASA/SCA/SCAI/SIR/STS/SVM Guidelines for the Diagnosis and Management of Patients With Thoracic Aortic Disease. Circulation. 2010; 121: K270-W237. Aortic aneurysm NOS (ICD10-I71.9)   Lexiscan Myoview 08/18/2019:  The study is normal.  This is a low risk study.   Normal pharmacologic nuclear stress test with no evidence for prior infarct or ischemia. LVEF not calculated. Frequent PVCs.  TTE 07/28/19:  1. Left ventricular ejection fraction, by visual estimation, is  50 to 55%. The left ventricle has low normal function. There is mildly increased left ventricular hypertrophy.  2. Mildly dilated left ventricular internal cavity size.  3. The left ventricle demonstrates regional wall motion abnormalities. Septal hypokinesis  4. Global right ventricle has normal systolic function.The right ventricular size is normal. No increase in right ventricular wall thickness.  5. Left atrial size was normal.  6. Right atrial size was normal.  7. The mitral valve is normal in structure. No evidence of mitral valve regurgitation.  8. The tricuspid valve is normal in structure. Tricuspid valve regurgitation is not demonstrated.  9. The aortic valve is tricuspid. Aortic valve regurgitation is not visualized. No evidence of aortic valve sclerosis or stenosis. 10. The inferior vena cava is normal in size with greater than 50% respiratory variability, suggesting right atrial pressure of 3 mmHg. 11. Aneurysm of the ascending aorta, measuring 48 mm.  ASSESSMENT:    1. Snoring   2. Thoracic aortic aneurysm without rupture (HCC)   3. Frequent PVCs   4. Essential hypertension   5. Chest pain of uncertain etiology    PLAN:    In order of problems listed above:  PVCs:  frequent PVCs (23%) of beats.  Symptomatic episodes on monitor appear to correspond to episodes of bigeminy.  Mild hypokalemia from chlorthalidone use may be contributing, started on potassium supplements, will check BMET.  TTE shows low normal systolic function (EF ~50%), septal hypokinesis.  No evidence of ischemia on Myoview.  Given frequent PVCs with hypokinesis on TTE, will refer to EP for evaluation, may benefit from ablation  Aortic aneurysm: 4.7 cm ascending aortic aneurysm on CTA chest 12/18.  Repeat CTA chest in 6 months.  Referral to cardiothoracic surgery  Hypertension: On amlodipine 10 mg daily, carvedilol 6.25 mg twice daily, and azilsartan-chlorthalidone 40-25 mg.  Given aortic aneurysm, recommend  goal SBP less than 120.  SBP 125 in clinic today, but patient also reports having episodes of lightheadedness that could be from low BP.  He is obtaining a BP monitor.  Asked him to check his blood pressure daily and when symptomatic for  next 2 weeks and call with results.  Suspect untreated OSA may be contributing to his hypertension, will order sleep study.  Chest pain: Atypical in description, as describes left-sided pain occurring at rest.  Given frequent PVCs and hypokinesis seen on TTE, Lexiscan Myoview on 08/18/19 showed no evidence of ischemia.  RTC in 3 months  Medication Adjustments/Labs and Tests Ordered: Current medicines are reviewed at length with the patient today.  Concerns regarding medicines are outlined above.  Orders Placed This Encounter  Procedures  . CT ANGIO CHEST AORTA W/CM & OR WO/CM  . Basic Metabolic Panel (BMET)  . Ambulatory referral to Cardiac Electrophysiology  . Ambulatory referral to Cardiothoracic Surgery  . Split night study   No orders of the defined types were placed in this encounter.   Patient Instructions  Medication Instructions:  No changes *If you need a refill on your cardiac medications before your next appointment, please call your pharmacy*  Lab Work: Your physician recommends that you return for lab work today (BMP) If you have labs (blood work) drawn today and your tests are completely normal, you will receive your results only by: Marland Kitchen MyChart Message (if you have MyChart) OR . A paper copy in the mail If you have any lab test that is abnormal or we need to change your treatment, we will call you to review the results.  Testing/Procedures: Your physician has recommended that you have a sleep study. This test records several body functions during sleep, including: brain activity, eye movement, oxygen and carbon dioxide blood levels, heart rate and rhythm, breathing rate and rhythm, the flow of air through your mouth and nose, snoring,  body muscle movements, and chest and belly movement.  [facility]  Non-Cardiac CT Angiography (CTA), is a special type of CT scan that uses a computer to produce multi-dimensional views of major blood vessels throughout the body. In CT angiography, a contrast material is injected through an IV to help visualize the blood vessels.  [facility]   Follow-Up: At [facility], you and your health needs are our priority.  As part of our continuing mission to provide you with exceptional heart care, we have created designated Provider Care Teams.  These Care Teams include your primary Cardiologist (physician) and Advanced Practice Providers (APPs -  Physician Assistants and Nurse Practitioners) who all work together to provide you with the care you need, when you need it.  Your next appointment:   3 month(s)  The format for your next appointment:   In Person  Provider:   Oswaldo Milian, MD  Other Instructions  You have been referred to Electrophysiology You have been referred to Cardiothoracic Surgery     Signed, Donato Heinz, MD  08/23/2019 11:34 AM    [place]

## 2019-08-23 NOTE — Patient Instructions (Addendum)
Medication Instructions:  No changes *If you need a refill on your cardiac medications before your next appointment, please call your pharmacy*  Lab Work: Your physician recommends that you return for lab work today (BMP) If you have labs (blood work) drawn today and your tests are completely normal, you will receive your results only by: Marland Kitchen MyChart Message (if you have MyChart) OR . A paper copy in the mail If you have any lab test that is abnormal or we need to change your treatment, we will call you to review the results.  Testing/Procedures: Your physician has recommended that you have a sleep study. This test records several body functions during sleep, including: brain activity, eye movement, oxygen and carbon dioxide blood levels, heart rate and rhythm, breathing rate and rhythm, the flow of air through your mouth and nose, snoring, body muscle movements, and chest and belly movement.  [facility]  Non-Cardiac CT Angiography (CTA), is a special type of CT scan that uses a computer to produce multi-dimensional views of major blood vessels throughout the body. In CT angiography, a contrast material is injected through an IV to help visualize the blood vessels.  [facility]   Follow-Up: At [facility], you and your health needs are our priority.  As part of our continuing mission to provide you with exceptional heart care, we have created designated Provider Care Teams.  These Care Teams include your primary Cardiologist (physician) and Advanced Practice Providers (APPs -  Physician Assistants and Nurse Practitioners) who all work together to provide you with the care you need, when you need it.  Your next appointment:   3 month(s)  The format for your next appointment:   In Person  Provider:   Epifanio Lesches, MD  Other Instructions  You have been referred to Electrophysiology You have been referred to Cardiothoracic Surgery

## 2019-08-24 ENCOUNTER — Telehealth: Payer: Self-pay | Admitting: *Deleted

## 2019-08-24 ENCOUNTER — Other Ambulatory Visit: Payer: Self-pay

## 2019-08-24 DIAGNOSIS — Z79899 Other long term (current) drug therapy: Secondary | ICD-10-CM

## 2019-08-24 MED ORDER — POTASSIUM CHLORIDE ER 20 MEQ PO TBCR: 20.0000 meq | EXTENDED_RELEASE_TABLET | Freq: Two times a day (BID) | ORAL | 3 refills | Status: DC

## 2019-08-24 NOTE — Telephone Encounter (Signed)
-----   Message from Teressa Senter, RN sent at 08/23/2019  5:02 PM EST ----- Regarding: Sleep Study Patient was ordered a sleep study today by Dr. Bjorn Pippin

## 2019-08-24 NOTE — Telephone Encounter (Signed)
Patient notified of sleep study appointment and COVID quarantine protocol. He voiced verbal understanding of both.

## 2019-08-25 ENCOUNTER — Other Ambulatory Visit (INDEPENDENT_AMBULATORY_CARE_PROVIDER_SITE_OTHER): Payer: Medicare Other

## 2019-08-25 ENCOUNTER — Ambulatory Visit: Payer: Medicare Other | Admitting: Cardiology

## 2019-08-25 DIAGNOSIS — I10 Essential (primary) hypertension: Secondary | ICD-10-CM

## 2019-08-25 DIAGNOSIS — I712 Thoracic aortic aneurysm, without rupture, unspecified: Secondary | ICD-10-CM

## 2019-08-25 DIAGNOSIS — Z79899 Other long term (current) drug therapy: Secondary | ICD-10-CM

## 2019-08-26 ENCOUNTER — Other Ambulatory Visit (HOSPITAL_COMMUNITY): Payer: Medicare Other

## 2019-08-30 ENCOUNTER — Encounter: Payer: Self-pay | Admitting: Internal Medicine

## 2019-08-30 ENCOUNTER — Other Ambulatory Visit: Payer: Self-pay

## 2019-08-30 ENCOUNTER — Inpatient Hospital Stay (HOSPITAL_COMMUNITY): Admission: RE | Admit: 2019-08-30 | Payer: Medicare Other | Source: Ambulatory Visit

## 2019-08-30 ENCOUNTER — Ambulatory Visit (INDEPENDENT_AMBULATORY_CARE_PROVIDER_SITE_OTHER): Payer: Medicare Other | Admitting: Internal Medicine

## 2019-08-30 DIAGNOSIS — I493 Ventricular premature depolarization: Secondary | ICD-10-CM | POA: Insufficient documentation

## 2019-08-30 MED ORDER — CARVEDILOL 12.5 MG PO TABS: 12.5000 mg | ORAL_TABLET | Freq: Two times a day (BID) | ORAL | 3 refills | Status: DC

## 2019-08-30 MED ORDER — AMLODIPINE BESYLATE 5 MG PO TABS: 5.0000 mg | ORAL_TABLET | Freq: Every day | ORAL | 3 refills | Status: DC

## 2019-08-30 NOTE — Patient Instructions (Addendum)
Medication Instructions:  Your physician has recommended you make the following change in your medication:   1.  INcrease your carvedilol--Take 12.5 mg by mouth twice a day.     2.  Decrease your Norvasc (amlodipine)--Take 5 mg by mouth ONCE a day  Labwork: None ordered.  Testing/Procedures: None ordered.  Follow-Up: Your physician wants you to follow-up in: 4 months with Dr. Ladona Ridgel.     Dec 28, 2019 at 9:30 am IN OFFICE visit   Any Other Special Instructions Will Be Listed Below (If Applicable).  If you need a refill on your cardiac medications before your next appointment, please call your pharmacy.

## 2019-08-30 NOTE — Progress Notes (Signed)
HPI Mr. Muldrew is referred today by Dr. Bjorn Pippin for evaluation of symptomatic PVC's. He has HTN and obesity and has undergone evaluation which demonstrated low normal EF, low risk perfusion scan and 22K PVC's in a 24 hour period. The patient has not had syncope and his PVC's are only minimally symptomatic. He was formally a caffeine user but has stopped this. He notes that since starting the coreg, his symptoms are improved. He denies peripheral edema. He has recently obtained a bp cuff and will start checking his bp.   No Known Allergies   Current Outpatient Medications  Medication Sig Dispense Refill  . amLODipine (NORVASC) 10 MG tablet Take by mouth.    . Azilsartan-Chlorthalidone 40-25 MG TABS Take by mouth.    . Blood Pressure Monitoring (BLOOD PRESSURE CUFF) MISC XL Adult blood pressure cuff 1 each 0  . carvedilol (COREG) 6.25 MG tablet Take 1 tablet (6.25 mg total) by mouth 2 (two) times daily. 180 tablet 3  . cyclobenzaprine (FLEXERIL) 10 MG tablet Take 1 tablet (10 mg total) by mouth 2 (two) times daily as needed for muscle spasms. 20 tablet 0  . Potassium Chloride ER 20 MEQ TBCR Take 20 mEq by mouth 2 (two) times daily. 180 tablet 3   No current facility-administered medications for this visit.     Past Medical History:  Diagnosis Date  . 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 Hypertension   . Thrombocytopenia (HCC) 07/07/2012   138,000 08/26/11; 98,000 06/29/12    ROS:   All systems reviewed and negative except as noted in the HPI.   Past Surgical History:  Procedure Laterality Date  . ADENOIDECTOMY    . ANTERIOR FUSION CERVICAL SPINE    . APPENDECTOMY    . HERNIA REPAIR    . IRRIGATION AND DEBRIDEMENT SEBACEOUS CYST     on scalp  . KNEE ARTHROSCOPY     x2  . LUMBAR FUSION    . POSTERIOR FUSION CERVICAL SPINE    . TONSILLECTOMY       No family history on file.   Social History   Socioeconomic History  .  Marital status: Single    Spouse name: Not on file  . Number of children: Not on file  . Years of education: Not on file  . Highest education level: Not on file  Occupational History  . Not on file  Tobacco Use  . 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 Smokeless tobacco: Never Used  Substance and Sexual Activity  . Alcohol use: No  . Drug use: No  . Sexual activity: Not on file  Other Topics Concern  . Not on file  Social History Narrative  . Not on file   Social Determinants of Health   Financial Resource Strain:   . Difficulty of Paying Living Expenses: Not on file  Food Insecurity:   . Worried About Programme researcher, broadcasting/film/video in the Last Year: Not on file  . Ran Out of Food in the Last Year: Not on file  Transportation Needs:   . Lack of Transportation (Medical): Not on file  . Lack of Transportation (Non-Medical): Not on file  Physical Activity:   . Days of Exercise per Week: Not on file  . Minutes of Exercise per Session: Not on file  Stress:   . Feeling of Stress : Not on file  Social Connections:   . Frequency of Communication with Friends and  Family: Not on file  . Frequency of Social Gatherings with Friends and Family: Not on file  . Attends Religious Services: Not on file  . Active Member of Clubs or Organizations: Not on file  . Attends Archivist Meetings: Not on file  . Marital Status: Not on file  Intimate Partner Violence:   . Fear of Current or Ex-Partner: Not on file  . Emotionally Abused: Not on file  . Physically Abused: Not on file  . Sexually Abused: Not on file     BP (!) 126/94   Pulse 77   Ht 6' (1.829 m)   Wt 285 lb (129.3 kg)   SpO2 95%   BMI 38.65 kg/m   Physical Exam:  Well appearing NAD HEENT: Unremarkable Neck:  No JVD, no thyromegally Lymphatics:  No adenopathy Back:  No CVA tenderness Lungs:  Clear with no wheezes HEART:  Regular rate rhythm, no murmurs, no rubs, no clicks Abd:  soft, positive bowel sounds, no  organomegally, no rebound, no guarding Ext:  2 plus pulses, no edema, no cyanosis, no clubbing Skin:  No rashes no nodules Neuro:  CN II through XII intact, motor grossly intact   Assess/Plan: 1. PVC"s - I have reviewed his PVC morphology and his cardiac monitor. I have recommended we uptitrate his beta blocker and and if needed we will down titrate the amlodipine. I would like to try and uptitrate the coreg to a goal of 25 mg twice daily and then repeat the cardiac monitor. We discussed other AA drugs and catheter ablation but hopefully neither will be required. 2. HTN  - his bp is controlled. I would like him to wean off of the amlodipine as we uptitrate the coreg. 3. Obesity - I encouraged him to lose weight.   Ricky [name].D.

## 2019-08-31 ENCOUNTER — Ambulatory Visit (HOSPITAL_COMMUNITY): Payer: Medicare Other

## 2019-09-03 ENCOUNTER — Other Ambulatory Visit (HOSPITAL_COMMUNITY)
Admission: RE | Admit: 2019-09-03 | Discharge: 2019-09-03 | Disposition: A | Discharge status: Home / Self Care | Payer: Medicare Other | Source: Ambulatory Visit | Attending: Cardiovascular Disease | Admitting: Cardiovascular Disease

## 2019-09-03 DIAGNOSIS — Z20822 Contact with and (suspected) exposure to covid-19: Secondary | ICD-10-CM | POA: Diagnosis not present

## 2019-09-03 DIAGNOSIS — Z01812 Encounter for preprocedural laboratory examination: Secondary | ICD-10-CM | POA: Insufficient documentation

## 2019-09-03 LAB — SARS CORONAVIRUS 2 (TAT 6-24 HRS): SARS Coronavirus 2: NEGATIVE

## 2019-09-06 ENCOUNTER — Other Ambulatory Visit: Payer: Self-pay

## 2019-09-06 ENCOUNTER — Ambulatory Visit (HOSPITAL_BASED_OUTPATIENT_CLINIC_OR_DEPARTMENT_OTHER): Payer: Medicare Other | Attending: Cardiology | Admitting: Cardiovascular Disease

## 2019-09-06 DIAGNOSIS — Z79899 Other long term (current) drug therapy: Secondary | ICD-10-CM | POA: Diagnosis not present

## 2019-09-06 DIAGNOSIS — I493 Ventricular premature depolarization: Secondary | ICD-10-CM | POA: Diagnosis not present

## 2019-09-06 DIAGNOSIS — G4733 Obstructive sleep apnea (adult) (pediatric): Secondary | ICD-10-CM | POA: Diagnosis not present

## 2019-09-06 DIAGNOSIS — R0683 Snoring: Secondary | ICD-10-CM | POA: Diagnosis present

## 2019-09-06 DIAGNOSIS — R0902 Hypoxemia: Secondary | ICD-10-CM | POA: Insufficient documentation

## 2019-09-07 ENCOUNTER — Other Ambulatory Visit (HOSPITAL_BASED_OUTPATIENT_CLINIC_OR_DEPARTMENT_OTHER): Payer: Self-pay

## 2019-09-07 DIAGNOSIS — R0683 Snoring: Secondary | ICD-10-CM

## 2019-09-18 ENCOUNTER — Encounter (HOSPITAL_BASED_OUTPATIENT_CLINIC_OR_DEPARTMENT_OTHER): Payer: Self-pay | Admitting: Cardiovascular Disease

## 2019-09-18 NOTE — Procedures (Signed)
    Patient Name: Ricky [name], Azbill Date: 09/06/2019 Gender: Male D.O.B: 08/28/1970 Age (years): 48 Referring Provider: Epifanio Lesches Height (inches): 72 Interpreting Physician: Nicki Guadalajara MD, ABSM Weight (lbs): 282 RPSGT: Armen Pickup BMI: 38 MRN: [id number] Neck Size: 17.00  CLINICAL INFORMATION Sleep Study Type: NPSG  Indication for sleep study: Snoring   Epworth Sleepiness Score: 9  SLEEP STUDY TECHNIQUE As per the AASM Manual for the Scoring of Sleep and Associated Events v2.3 (April 2016) with a hypopnea requiring 4% desaturations.  The channels recorded and monitored were frontal, central and occipital EEG, electrooculogram (EOG), submentalis EMG (chin), nasal and oral airflow, thoracic and abdominal wall motion, anterior tibialis EMG, snore microphone, electrocardiogram, and pulse oximetry.  MEDICATIONS amLODipine (NORVASC) 5 MG tablet Azilsartan-Chlorthalidone 40-25 MG TABS Blood Pressure Monitoring (BLOOD PRESSURE CUFF) MISC carvedilol (COREG) 12.5 MG tablet cyclobenzaprine (FLEXERIL) 10 MG tablet Potassium Chloride ER 20 MEQ TBCR   Medications self-administered by patient taken the night of the study : N/A  SLEEP ARCHITECTURE The study was initiated at 10:03:17 PM and ended at 4:31:54 AM.  Sleep onset time was 15.4 minutes and the sleep efficiency was 80.9%%. The total sleep time was 314.5 minutes.  Stage REM latency was 80.5 minutes.  The patient spent 10.0%% of the night in stage N1 sleep, 60.4%% in stage N2 sleep, 8.9%% in stage N3 and 20.7% in REM.  Alpha intrusion was absent.  Supine sleep was 11.76%.  RESPIRATORY PARAMETERS The overall apnea/hypopnea index (AHI) was 15.3 per hour. The respiratory disturbance index (RDI) was 17.9/h. There were 3 total apneas, including 2 obstructive, 1 central and 0 mixed apneas. There were 77 hypopneas and 14 RERAs.  The AHI during Stage REM sleep was 48.0 per hour.  AHI while supine was 56.8  per hour.  The mean oxygen saturation was 91.6%. The minimum SpO2 during sleep was 77.0%.  Loud snoring was noted during this study.  CARDIAC DATA The 2 lead EKG demonstrated sinus rhythm. The mean heart rate was 75.7 beats per minute. Other EKG findings include: PVCs.  LEG MOVEMENT DATA The total PLMS were 0 with a resulting PLMS index of 0.0. Associated arousal with leg movement index was 1.7 .  IMPRESSIONS - Moderate obstructive sleep apnea overall (AHI 15.3/h; RDI 17.9/h); however, events were severe during REM sleep (AHI 48.0/h) and supine sleep (AHI 56.8/h). - No significant central sleep apnea occurred during this study (CAI = 0.2/h). - Significant oxygen desaturation to a nadir 77.0%. - The patient snored with loud snoring volume. - EKG findings include PVCs. - Clinically significant periodic limb movements did not occur during sleep. No significant associated arousals.  DIAGNOSIS - Obstructive Sleep Apnea (327.23 [G47.33 ICD-10]) - Nocturnal Hypoxemia (327.26 [G47.36 ICD-10])  RECOMMENDATIONS - Therapeutic CPAP titration to determine optimal pressure required to alleviate sleep disordered breathing. - Effort should be made to optimize nasal and oropharyngeal patency. - Positional therapy avoiding supine position during sleep. - Avoid alcohol, sedatives and other CNS depressants that may worsen sleep apnea and disrupt normal sleep architecture. - Sleep hygiene should be reviewed to assess factors that may improve sleep quality. - Weight management (BMI 38) and regular exercise should be initiated.  [Electronically signed] 09/18/2019 01:12 PM  Nicki Guadalajara MD, [facility], ABSM Diplomate, American Board of Sleep Medicine   NPI: [id number] [facility] PH: [phone]   FX: [phone] ACCREDITED BY THE AMERICAN ACADEMY OF SLEEP MEDICINE

## 2019-09-19 ENCOUNTER — Other Ambulatory Visit: Payer: Self-pay | Admitting: Cardiovascular Disease

## 2019-09-19 DIAGNOSIS — G4736 Sleep related hypoventilation in conditions classified elsewhere: Secondary | ICD-10-CM

## 2019-09-19 DIAGNOSIS — IMO0002 Reserved for concepts with insufficient information to code with codable children: Secondary | ICD-10-CM

## 2019-09-19 DIAGNOSIS — G4733 Obstructive sleep apnea (adult) (pediatric): Secondary | ICD-10-CM

## 2019-09-21 ENCOUNTER — Telehealth: Payer: Self-pay | Admitting: *Deleted

## 2019-09-21 NOTE — Telephone Encounter (Signed)
Patient notified of sleep study results and recommendations. He agrees to proceed with CPAP titration study. °

## 2019-09-22 ENCOUNTER — Telehealth: Payer: Self-pay | Admitting: *Deleted

## 2019-09-22 NOTE — Telephone Encounter (Signed)
Patient notified of COVID and CPAP titration appointments and details.

## 2019-10-05 ENCOUNTER — Other Ambulatory Visit (HOSPITAL_COMMUNITY): Payer: Medicare Other

## 2019-10-07 ENCOUNTER — Encounter (HOSPITAL_BASED_OUTPATIENT_CLINIC_OR_DEPARTMENT_OTHER): Payer: Medicare Other | Admitting: Cardiovascular Disease

## 2019-10-07 ENCOUNTER — Other Ambulatory Visit (HOSPITAL_COMMUNITY): Payer: Medicare Other

## 2019-10-09 ENCOUNTER — Encounter (HOSPITAL_BASED_OUTPATIENT_CLINIC_OR_DEPARTMENT_OTHER): Payer: Medicare Other | Admitting: Cardiovascular Disease

## 2019-10-17 ENCOUNTER — Other Ambulatory Visit (HOSPITAL_COMMUNITY)
Admission: RE | Admit: 2019-10-17 | Discharge: 2019-10-17 | Disposition: A | Discharge status: Home / Self Care | Payer: Medicare Other | Source: Ambulatory Visit | Attending: Cardiovascular Disease | Admitting: Cardiovascular Disease

## 2019-10-17 DIAGNOSIS — Z20822 Contact with and (suspected) exposure to covid-19: Secondary | ICD-10-CM | POA: Diagnosis not present

## 2019-10-17 DIAGNOSIS — Z01812 Encounter for preprocedural laboratory examination: Secondary | ICD-10-CM | POA: Diagnosis present

## 2019-10-17 LAB — SARS CORONAVIRUS 2 (TAT 6-24 HRS): SARS Coronavirus 2: NEGATIVE

## 2019-10-18 ENCOUNTER — Ambulatory Visit (HOSPITAL_BASED_OUTPATIENT_CLINIC_OR_DEPARTMENT_OTHER): Payer: Medicare Other | Attending: Cardiovascular Disease | Admitting: Cardiovascular Disease

## 2019-10-18 ENCOUNTER — Other Ambulatory Visit: Payer: Self-pay

## 2019-10-18 DIAGNOSIS — G4733 Obstructive sleep apnea (adult) (pediatric): Secondary | ICD-10-CM | POA: Insufficient documentation

## 2019-10-18 DIAGNOSIS — Z79899 Other long term (current) drug therapy: Secondary | ICD-10-CM | POA: Diagnosis not present

## 2019-10-18 DIAGNOSIS — G4736 Sleep related hypoventilation in conditions classified elsewhere: Secondary | ICD-10-CM | POA: Insufficient documentation

## 2019-10-19 ENCOUNTER — Other Ambulatory Visit (HOSPITAL_BASED_OUTPATIENT_CLINIC_OR_DEPARTMENT_OTHER): Payer: Self-pay

## 2019-10-19 ENCOUNTER — Encounter (HOSPITAL_BASED_OUTPATIENT_CLINIC_OR_DEPARTMENT_OTHER): Payer: Medicare Other | Admitting: Cardiovascular Disease

## 2019-10-19 DIAGNOSIS — IMO0002 Reserved for concepts with insufficient information to code with codable children: Secondary | ICD-10-CM

## 2019-10-19 DIAGNOSIS — G4733 Obstructive sleep apnea (adult) (pediatric): Secondary | ICD-10-CM

## 2019-10-19 DIAGNOSIS — G4736 Sleep related hypoventilation in conditions classified elsewhere: Secondary | ICD-10-CM

## 2019-10-21 ENCOUNTER — Other Ambulatory Visit: Payer: Self-pay

## 2019-10-21 ENCOUNTER — Telehealth: Payer: Self-pay | Admitting: Cardiology

## 2019-10-21 ENCOUNTER — Telehealth: Payer: Self-pay | Admitting: Radiology

## 2019-10-21 ENCOUNTER — Encounter: Payer: Self-pay | Admitting: Cardiology

## 2019-10-21 ENCOUNTER — Ambulatory Visit (INDEPENDENT_AMBULATORY_CARE_PROVIDER_SITE_OTHER): Payer: Medicare Other | Admitting: Cardiology

## 2019-10-21 VITALS — BP 137/90 | HR 64 | Temp 96.8°F | Ht 72.0 in | Wt 293.8 lb

## 2019-10-21 DIAGNOSIS — I10 Essential (primary) hypertension: Secondary | ICD-10-CM

## 2019-10-21 DIAGNOSIS — G4733 Obstructive sleep apnea (adult) (pediatric): Secondary | ICD-10-CM

## 2019-10-21 DIAGNOSIS — I712 Thoracic aortic aneurysm, without rupture, unspecified: Secondary | ICD-10-CM

## 2019-10-21 DIAGNOSIS — I493 Ventricular premature depolarization: Secondary | ICD-10-CM | POA: Diagnosis not present

## 2019-10-21 DIAGNOSIS — R079 Chest pain, unspecified: Secondary | ICD-10-CM | POA: Diagnosis not present

## 2019-10-21 LAB — BASIC METABOLIC PANEL
BUN/Creatinine Ratio: 15 (ref 9–20)
BUN: 11 mg/dL (ref 6–24)
CO2: 24 mmol/L (ref 20–29)
Calcium: 9.1 mg/dL (ref 8.7–10.2)
Chloride: 99 mmol/L (ref 96–106)
Creatinine, Ser: 0.74 mg/dL — ABNORMAL LOW (ref 0.76–1.27)
GFR calc Af Amer: 125 mL/min/{1.73_m2} (ref >59)
GFR calc non Af Amer: 108 mL/min/{1.73_m2} (ref >59)
Glucose: 102 mg/dL — ABNORMAL HIGH (ref 65–99)
Potassium: 4.1 mmol/L (ref 3.5–5.2)
Sodium: 138 mmol/L (ref 134–144)

## 2019-10-21 MED ORDER — CARVEDILOL 25 MG PO TABS: 25.0000 mg | ORAL_TABLET | Freq: Two times a day (BID) | ORAL | 3 refills | Status: DC

## 2019-10-21 NOTE — Patient Instructions (Signed)
Medication Instructions:  INCREASE carvedilol (Coreg) to 25 mg two times daily  *If you need a refill on your cardiac medications before your next appointment, please call your pharmacy*   Lab Work: NONE  Testing/Procedures: CT Angio Aorta/Chest in [place] Non-Cardiac CT scanning, (CAT scanning), is a noninvasive, special x-ray that produces cross-sectional images of the body using x-rays and a computer. CT scans help physicians diagnose and treat medical conditions. For some CT exams, a contrast material is used to enhance visibility in the area of the body being studied. CT scans provide greater clarity and reveal more details than regular x-ray exams.   ZIO XT- Long Term Monitor Instructions   Your physician has requested you wear your ZIO patch monitor___3___days.   This is a single patch monitor.  Irhythm supplies one patch monitor per enrollment.  Additional stickers are not available.   Please do not apply patch if you will be having a Nuclear Stress Test, Echocardiogram, Cardiac CT, MRI, or Chest Xray during the time frame you would be wearing the monitor. The patch cannot be worn during these tests.  You cannot remove and re-apply the ZIO XT patch monitor.   Your ZIO patch monitor will be sent USPS Priority mail from [facility] directly to your home address. The monitor may also be mailed to a PO BOX if home delivery is not available.   It may take 3-5 days to receive your monitor after you have been enrolled.   Once you have received you monitor, please review enclosed instructions.  Your monitor has already been registered assigning a specific monitor serial # to you.   Applying the monitor   Shave hair from upper left chest.   Hold abrader disc by orange tab.  Rub abrader in 40 strokes over left upper chest as indicated in your monitor instructions.   Clean area with 4 enclosed alcohol pads .  Use all pads to assure are is cleaned thoroughly.  Let dry.   Apply  patch as indicated in monitor instructions.  Patch will be place under collarbone on left side of chest with arrow pointing upward.   Rub patch adhesive wings for 2 minutes.Remove white label marked "1".  Remove white label marked "2".  Rub patch adhesive wings for 2 additional minutes.   While looking in a mirror, press and release button in center of patch.  A small green light will flash 3-4 times .  This will be your only indicator the monitor has been turned on.     Do not shower for the first 24 hours.  You may shower after the first 24 hours.   Press button if you feel a symptom. You will hear a small click.  Record Date, Time and Symptom in the Patient Log Book.   When you are ready to remove patch, follow instructions on last 2 pages of Patient Log Book.  Stick patch monitor onto last page of Patient Log Book.   Place Patient Log Book in [place] box.  Use locking tab on box and tape box closed securely.  The Orange and [employer] has [employer] on it.  Please place in mailbox as soon as possible.  Your physician should have your test results approximately 7 days after the monitor has been mailed back to [facility].   Call [facility] Customer Care at [phone] if you have questions regarding your ZIO XT patch monitor.  Call them immediately if you see an orange light blinking on your  monitor.   If your monitor falls off in less than 4 days contact our Monitor department at [phone].  If your monitor becomes loose or falls off after 4 days call [employer] at [phone] for suggestions on securing your monitor.    Follow-Up: At [facility], you and your health needs are our priority.  As part of our continuing mission to provide you with exceptional heart care, we have created designated Provider Care Teams.  These Care Teams include your primary Cardiologist (physician) and Advanced Practice Providers (APPs -  Physician Assistants and Nurse Practitioners) who all work  together to provide you with the care you need, when you need it.  We recommend signing up for the patient portal called "MyChart".  Sign up information is provided on this After Visit Summary.  MyChart is used to connect with patients for Virtual Visits (Telemedicine).  Patients are able to view lab/test results, encounter notes, upcoming appointments, etc.  Non-urgent messages can be sent to your provider as well.   To learn more about what you can do with MyChart, go to [email or web address].    Your next appointment:   3 month(s) (after CT completed)  The format for your next appointment:   In Person  Provider:   Oswaldo Milian, MD

## 2019-10-21 NOTE — Telephone Encounter (Signed)
error 

## 2019-10-21 NOTE — Telephone Encounter (Signed)
Enrolled patient for a 3 day Zio monitor to be mailed to patients home.  

## 2019-10-21 NOTE — Progress Notes (Signed)
Cardiology Office Note:    Date:  10/23/2019   ID:  Ricky [name], DOB 1970-12-21, MRN [id number]  PCP:  Maryagnes Amos, FNP  Cardiologist:  No primary care provider on file.  Electrophysiologist:  None   Referring MD: Maryagnes Amos   Chief Complaint  Patient presents with  . Palpitations    History of Present Illness:    Ricky [name] is a 49 y.o. male with a hx of hypertension, thrombocytopenia who presents for follow-up.  He was initially seen on 07/22/2019 after he was referred by Ricky Bee, FNP for an evaluation of PVCs.  Had an ED visit on 07/10/2019 with chest pain/body aches and lightheadedness.  EKG showed bigeminy. TTE was done on 07/28/2019 which showed low normal systolic function (EF 50 to 55%), septal hypokinesis.  Also was notable for ascending aortic aneurysm measuring up to 48 mm.  Underwent CTA chest on 08/16/2019, which showed ascending aortic aneurysm measuring up to 67mm.  Cardiac monitor showed frequent PVCs (23% of beats) and 120 episodes of SVT, lasting up to 1 minute.  Lexiscan Myoview on 08/18/2019 showed no evidence of ischemia.  Diagnosed with OSA.  He was referred to Dr. Ladona Ridgel in EP for evaluation of his PVCs, recommended uptitrating his beta-blocker as initial treatment and repeat monitor once on Coreg 25 mg twice daily.  Since last clinic visit, patient reports that he has been doing well.  Denies any palpitations.  Reports he checks his blood pressure at home and has been 120s 140s over 80s to 90s.  States that he has had a couple extra episodes of chest pain while moving boxes, reports last just a few seconds and resolves.  He denies any shortness of breath.  He was recently diagnosed with OSA, has not started CPAP yet   Past Medical History:  Diagnosis Date  . 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 Hypertension   . Thrombocytopenia (HCC) 07/07/2012   138,000 08/26/11; 98,000 06/29/12     Past Surgical History:  Procedure Laterality Date  . ADENOIDECTOMY    . ANTERIOR FUSION CERVICAL SPINE    . APPENDECTOMY    . HERNIA REPAIR    . IRRIGATION AND DEBRIDEMENT SEBACEOUS CYST     on scalp  . KNEE ARTHROSCOPY     x2  . LUMBAR FUSION    . POSTERIOR FUSION CERVICAL SPINE    . TONSILLECTOMY      Current Medications: Current Meds  Medication Sig  . amLODipine (NORVASC) 5 MG tablet Take 1 tablet (5 mg total) by mouth daily.  . Azilsartan-Chlorthalidone 40-25 MG TABS Take by mouth.  . Blood Pressure Monitoring (BLOOD PRESSURE CUFF) MISC XL Adult blood pressure cuff  . carvedilol (COREG) 25 MG tablet Take 1 tablet (25 mg total) by mouth 2 (two) times daily.  . cyclobenzaprine (FLEXERIL) 10 MG tablet Take 1 tablet (10 mg total) by mouth 2 (two) times daily as needed for muscle spasms.  . Potassium Chloride ER 20 MEQ TBCR Take 20 mEq by mouth 2 (two) times daily.  . [DISCONTINUED] carvedilol (COREG) 12.5 MG tablet Take 1 tablet (12.5 mg total) by mouth 2 (two) times daily.     Allergies:   Patient has no known allergies.   Social History   Socioeconomic History  . Marital status: Single    Spouse name: Not on file  . Number of children: Not on file  . Years of education: Not on  file  . Highest education level: Not on file  Occupational History  . Not on file  Tobacco Use  . 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 Smokeless tobacco: Never Used  Substance and Sexual Activity  . Alcohol use: No  . Drug use: No  . Sexual activity: Not on file  Other Topics Concern  . Not on file  Social History Narrative  . Not on file   Social Determinants of Health   Financial Resource Strain:   . Difficulty of Paying Living Expenses: Not on file  Food Insecurity:   . Worried About Programme researcher, broadcasting/film/video in the Last Year: Not on file  . Ran Out of Food in the Last Year: Not on file  Transportation Needs:   . Lack of Transportation (Medical): Not on file  . Lack of  Transportation (Non-Medical): Not on file  Physical Activity:   . Days of Exercise per Week: Not on file  . Minutes of Exercise per Session: Not on file  Stress:   . Feeling of Stress : Not on file  Social Connections:   . Frequency of Communication with Friends and Family: Not on file  . Frequency of Social Gatherings with Friends and Family: Not on file  . Attends Religious Services: Not on file  . Active Member of Clubs or Organizations: Not on file  . Attends Banker Meetings: Not on file  . Marital Status: Not on file     Family History: No family history of heart disease  ROS:   Please see the history of present illness.    All other systems reviewed and are negative.  EKGs/Labs/Other Studies Reviewed:    The following studies were reviewed today:  EKG:  EKG is ordered today.  The ekg ordered today demonstrates sinus rhythm, rate 64, no PVCs  Recent Labs: 07/10/2019: ALT 23; Hemoglobin 15.1; Platelets 157 07/22/2019: Magnesium 2.1 10/21/2019: BUN 11; Creatinine, Ser 0.74; Potassium 4.1; Sodium 138  Recent Lipid Panel No results found for: CHOL, TRIG, HDL, CHOLHDL, VLDL, LDLCALC, LDLDIRECT  Physical Exam:    VS:  BP 137/90   Pulse 64   Temp (!) 96.8 F (36 C)   Ht 6' (1.829 m)   Wt 293 lb 12.8 oz (133.3 kg)   SpO2 98%   BMI 39.85 kg/m     Wt Readings from Last 3 Encounters:  10/21/19 293 lb 12.8 oz (133.3 kg)  10/18/19 282 lb (127.9 kg)  09/06/19 282 lb (127.9 kg)     GEN:  Well nourished, well developed in no acute distress HEENT: Normal NECK: No JVD LYMPHATICS: No lymphadenopathy CARDIAC: RRR, no murmurs, rubs, gallops RESPIRATORY:  Clear to auscultation without rales, wheezing or rhonchi  ABDOMEN: Soft, non-tender, non-distended MUSCULOSKELETAL:  No edema; No deformity  SKIN: Warm and dry NEUROLOGIC:  Alert and oriented x 3 PSYCHIATRIC:  Normal affect    Cardiac monitor 08/21/19:  Frequent ventricular ectopy (23% of beats)  120  episodes of SVT, longest lasting 59 seconds at 152 bpm  One 4 beat episode of NSVT  Patient triggered events corresponded to sinus rhythm with ventricular ectopy, incluging bigeminy   4 days of data recorded on Zio monitor. Patient had a min HR of 46 bpm, max HR of 207 bpm, and avg HR of 77 bpm. Predominant underlying rhythm was Sinus Rhythm. No atrial fibrillation, high degree block, or pauses noted. There was one 4 beat run of NSVT and 120 runs of SVT, longest lasting 59  seconds at 152 bpm.  Isolated atrial was ectopy was rare (<1%).  Very frequent ventricular ectopy (23% of beats).  Episodes of bigeminy (longest lasting 132 seconds) and trigeminy (longest lasting 98 seconds).  There were 2 triggered events, corresponding to sinus rhythm with ventricular bigeminy.   No significant arrhythmias detected.  CTA chest 08/16/19: Aneurysmal dilatation of the ascending thoracic aorta up to 47 mm. Recommend semi-annual imaging followup by CTA or MRA and referral to cardiothoracic surgery if not already obtained. This recommendation follows 2010 ACCF/AHA/AATS/ACR/ASA/SCA/SCAI/SIR/STS/SVM Guidelines for the Diagnosis and Management of Patients With Thoracic Aortic Disease. Circulation. 2010; 121: Z610-R604. Aortic aneurysm NOS (ICD10-I71.9)   Lexiscan Myoview 08/18/2019:  The study is normal.  This is a low risk study.   Normal pharmacologic nuclear stress test with no evidence for prior infarct or ischemia. LVEF not calculated. Frequent PVCs.  TTE 07/28/19:  1. Left ventricular ejection fraction, by visual estimation, is 50 to 55%. The left ventricle has low normal function. There is mildly increased left ventricular hypertrophy.  2. Mildly dilated left ventricular internal cavity size.  3. The left ventricle demonstrates regional wall motion abnormalities. Septal hypokinesis  4. Global right ventricle has normal systolic function.The right ventricular size is normal. No increase in right  ventricular wall thickness.  5. Left atrial size was normal.  6. Right atrial size was normal.  7. The mitral valve is normal in structure. No evidence of mitral valve regurgitation.  8. The tricuspid valve is normal in structure. Tricuspid valve regurgitation is not demonstrated.  9. The aortic valve is tricuspid. Aortic valve regurgitation is not visualized. No evidence of aortic valve sclerosis or stenosis. 10. The inferior vena cava is normal in size with greater than 50% respiratory variability, suggesting right atrial pressure of 3 mmHg. 11. Aneurysm of the ascending aorta, measuring 48 mm.  ASSESSMENT:    1. PVC's (premature ventricular contractions)   2. Thoracic aortic aneurysm without rupture (HCC)   3. Hypertension, unspecified type   4. Chest pain of uncertain etiology   5. OSA (obstructive sleep apnea)    PLAN:     PVCs:  frequent PVCs (23%) of beats.  Symptomatic episodes on monitor appear to correspond to episodes of bigeminy.  Mild hypokalemia from chlorthalidone use may be contributing, started on potassium supplements.  TTE shows low normal systolic function (EF ~50%), septal hypokinesis.  No evidence of ischemia on Myoview.   -Seen by EP, Dr Ladona Ridgel recommends titrating up coreg, with plan to repeat monitor once on coreg 25 mg BID.  Given BP remains above goal SBP less than 120 given aortic aneurysm, will increase carvedilol to 25 mg twice daily and repeat Zio patch x3 days to quantify PVC burden -Will check BMET to ensure potassium at goal >4  Aortic aneurysm: 4.7 cm ascending aortic aneurysm on CTA chest 12/18.  Repeat CTA chest in 01/2020.    Hypertension: On amlodipine 5 mg daily, carvedilol 12.5 mg twice daily, and azilsartan-chlorthalidone 40-25 mg.  Given aortic aneurysm, recommend goal SBP less than 120.  BP 137/90 clinic today, will increase carvedilol to 25 mg twice daily as above.  Untreated OSA likely contributing.  Recently diagnosed, is starting CPAP  Chest  pain: Atypical in description, as describes left-sided pain occurring at rest.  Given frequent PVCs and hypokinesis seen on TTE, Lexiscan Myoview on 08/18/19 showed no evidence of ischemia.  OSA: Recently diagnosed, starting CPAP  RTC in 3 months  Medication Adjustments/Labs and Tests Ordered: Current medicines are  reviewed at length with the patient today.  Concerns regarding medicines are outlined above.  Orders Placed This Encounter  Procedures  . Basic metabolic panel  . LONG TERM MONITOR (3-14 DAYS)  . EKG 12-Lead   Meds ordered this encounter  Medications  . carvedilol (COREG) 25 MG tablet    Sig: Take 1 tablet (25 mg total) by mouth 2 (two) times daily.    Dispense:  180 tablet    Refill:  3    Dose increase    Patient Instructions  Medication Instructions:  INCREASE carvedilol (Coreg) to 25 mg two times daily  *If you need a refill on your cardiac medications before your next appointment, please call your pharmacy*   Lab Work: NONE  Testing/Procedures: CT Angio Aorta/Chest in [place] Non-Cardiac CT scanning, (CAT scanning), is a noninvasive, special x-ray that produces cross-sectional images of the body using x-rays and a computer. CT scans help physicians diagnose and treat medical conditions. For some CT exams, a contrast material is used to enhance visibility in the area of the body being studied. CT scans provide greater clarity and reveal more details than regular x-ray exams.   ZIO XT- Long Term Monitor Instructions   Your physician has requested you wear your ZIO patch monitor___3___days.   This is a single patch monitor.  Irhythm supplies one patch monitor per enrollment.  Additional stickers are not available.   Please do not apply patch if you will be having a Nuclear Stress Test, Echocardiogram, Cardiac CT, MRI, or Chest Xray during the time frame you would be wearing the monitor. The patch cannot be worn during these tests.  You cannot remove and re-apply  the ZIO XT patch monitor.   Your ZIO patch monitor will be sent USPS Priority mail from [facility] directly to your home address. The monitor may also be mailed to a PO BOX if home delivery is not available.   It may take 3-5 days to receive your monitor after you have been enrolled.   Once you have received you monitor, please review enclosed instructions.  Your monitor has already been registered assigning a specific monitor serial # to you.   Applying the monitor   Shave hair from upper left chest.   Hold abrader disc by orange tab.  Rub abrader in 40 strokes over left upper chest as indicated in your monitor instructions.   Clean area with 4 enclosed alcohol pads .  Use all pads to assure are is cleaned thoroughly.  Let dry.   Apply patch as indicated in monitor instructions.  Patch will be place under collarbone on left side of chest with arrow pointing upward.   Rub patch adhesive wings for 2 minutes.Remove white label marked "1".  Remove white label marked "2".  Rub patch adhesive wings for 2 additional minutes.   While looking in a mirror, press and release button in center of patch.  A small green light will flash 3-4 times .  This will be your only indicator the monitor has been turned on.     Do not shower for the first 24 hours.  You may shower after the first 24 hours.   Press button if you feel a symptom. You will hear a small click.  Record Date, Time and Symptom in the Patient Log Book.   When you are ready to remove patch, follow instructions on last 2 pages of Patient Log Book.  Stick patch monitor onto last page of Patient Log Book.  Place Patient Log Book in [place] box.  Use locking tab on box and tape box closed securely.  The Orange and [employer] has [employer] on it.  Please place in mailbox as soon as possible.  Your physician should have your test results approximately 7 days after the monitor has been mailed back to [facility].   Call [facility] at [phone] if you have questions regarding your ZIO XT patch monitor.  Call them immediately if you see an orange light blinking on your monitor.   If your monitor falls off in less than 4 days contact our Monitor department at [phone].  If your monitor becomes loose or falls off after 4 days call [employer] at [phone] for suggestions on securing your monitor.    Follow-Up: At [facility], you and your health needs are our priority.  As part of our continuing mission to provide you with exceptional heart care, we have created designated Provider Care Teams.  These Care Teams include your primary Cardiologist (physician) and Advanced Practice Providers (APPs -  Physician Assistants and Nurse Practitioners) who all work together to provide you with the care you need, when you need it.  We recommend signing up for the patient portal called "MyChart".  Sign up information is provided on this After Visit Summary.  MyChart is used to connect with patients for Virtual Visits (Telemedicine).  Patients are able to view lab/test results, encounter notes, upcoming appointments, etc.  Non-urgent messages can be sent to your provider as well.   To learn more about what you can do with MyChart, go to [email or web address].    Your next appointment:   3 month(s) (after CT completed)  The format for your next appointment:   In Person  Provider:   Oswaldo Milian, MD        Signed, Donato Heinz, MD  10/23/2019 8:23 PM    [place]

## 2019-10-24 ENCOUNTER — Encounter (HOSPITAL_BASED_OUTPATIENT_CLINIC_OR_DEPARTMENT_OTHER): Payer: Self-pay | Admitting: Cardiovascular Disease

## 2019-10-24 NOTE — Procedures (Signed)
Patient Name: Ricky [name], Ricky [name] Date: 10/18/2019 Gender: Male D.O.B: 1970-10-28 Age (years): 81 Referring Provider: Shelva Majestic MD, ABSM Height (inches): 72 Interpreting Physician: Shelva Majestic MD, ABSM Weight (lbs): 282 RPSGT: Laren Everts BMI: 38 MRN: [id number] Neck Size: 17.00  CLINICAL INFORMATION The patient is referred for a CPAP titration to treat sleep apnea.  Date of NPSG: 09/06/2019: AHI 15.0; RDI 17; REM AHI 48.0/h; supine AHI 56.8/h: loud snoring; O2 desaturation to 77%.  SLEEP STUDY TECHNIQUE As per the AASM Manual for the Scoring of Sleep and Associated Events v2.3 (April 2016) with a hypopnea requiring 4% desaturations.  The channels recorded and monitored were frontal, central and occipital EEG, electrooculogram (EOG), submentalis EMG (chin), nasal and oral airflow, thoracic and abdominal wall motion, anterior tibialis EMG, snore microphone, electrocardiogram, and pulse oximetry. Continuous positive airway pressure (CPAP) was initiated at the beginning of the study and titrated to treat sleep-disordered breathing.  MEDICATIONS amLODipine (NORVASC) 5 MG tablet Azilsartan-Chlorthalidone 40-25 MG TABS Blood Pressure Monitoring (BLOOD PRESSURE CUFF) MISC carvedilol (COREG) 25 MG tablet cyclobenzaprine (FLEXERIL) 10 MG tablet Potassium Chloride ER 20 MEQ TBCR  Medications self-administered by patient taken the night of the study : POTASSIUM CHLORIDE, CARVEDILOL  TECHNICIAN COMMENTS Comments added by technician: Patient was restless all through the night. Comments added by scorer: N/A  RESPIRATORY PARAMETERS Optimal PAP Pressure (cm): 9 AHI at Optimal Pressure (/hr): 1.7 Overall Minimal O2 (%): 88.0 Supine % at Optimal Pressure (%): 2 Minimal O2 at Optimal Pressure (%): 91.0   SLEEP ARCHITECTURE The study was initiated at 10:27:22 PM and ended at 4:54:53 AM.  Sleep onset time was 12.3 minutes and the sleep efficiency was 74.4%%. The total sleep  time was 288.5 minutes.  The patient spent 15.6%% of the night in stage N1 sleep, 73.8%% in stage N2 sleep, 0.0%% in stage N3 and 10.6% in REM.Stage REM latency was 115.0 minutes  Wake after sleep onset was 86.7. Alpha intrusion was absent. Supine sleep was 16.64%.  CARDIAC DATA The 2 lead EKG demonstrated sinus rhythm. The mean heart rate was 61.9 beats per minute. Other EKG findings include: PVCs.  LEG MOVEMENT DATA The total Periodic Limb Movements of Sleep (PLMS) were 0. The PLMS index was 0.0. A PLMS index of <15 is considered normal in adults.  IMPRESSIONS - CPAP was initiated at 6 cm and was titrated to optimal PAP pressure at 9 cm of water; AHI 0; O2 nadir 91%. - Central sleep apnea was not noted during this titration (CAI = 0.6/h). - Mild oxygen desaturations to a nadir of 88.0% at 8 cm. - The patient snored with soft snoring volume during this titration study. - 2-lead EKG demonstrated: occasional PVCs - Clinically significant periodic limb movements were not noted during this study. Arousals associated with PLMs were rare.  DIAGNOSIS - Obstructive Sleep Apnea (327.23 [G47.33 ICD-10])  RECOMMENDATIONS - Recommend an initial trial of CPAP therapy on 9 cm H2O with heated humidification.  A Medium Wide size Philips Respironics Full Face Mask Dreamwear mask was used for the titration. - Effort should be made to optimize nasal and oropharyngeal patency. - Patient should be counseled to avoid supine sleep. - Avoid alcohol, sedatives and other CNS depressants that may worsen sleep apnea and disrupt normal sleep architecture. - Sleep hygiene should be reviewed to assess factors that may improve sleep quality. - Weight management and regular exercise should be initiated or continued. - Recommend a download in 30 days and sleep clinic evaluation  after 4 weeks of therapy.   [Electronically signed] 10/24/2019 04:21 PM  Nicki Guadalajara MD, [facility], ABSM Diplomate, American Board of Sleep  Medicine   NPI: [id number] [facility] PH: [phone]   FX: [phone] ACCREDITED BY THE AMERICAN ACADEMY OF SLEEP MEDICINE

## 2019-10-25 ENCOUNTER — Telehealth: Payer: Self-pay | Admitting: *Deleted

## 2019-10-25 NOTE — Telephone Encounter (Signed)
Patient notified sleep study completed. Orders for CPAP machine will be sent to [facility].

## 2019-10-26 ENCOUNTER — Telehealth: Payer: Self-pay | Admitting: Cardiology

## 2019-10-26 NOTE — Telephone Encounter (Signed)
New Message:    Pt said he saw Dr Jerene Pitch on 10-21-19 and he increased his Carvedilol. He says it is not helping his blood pressure. He says it is making him nervous about that and very concerned. He says he needs to talk to somebody about this asap.please.

## 2019-10-26 NOTE — Telephone Encounter (Signed)
Spoke with pt and B/p is still running high 125-130/90-110 but these readings are prior to meds Instructed pt to check B/p a couple hours after taking meds and also may try and space out the medicines by taking Coreg and the combination pill in the am and then in the pm take Coreg and Amlodipine and pt also on his own went back up on the Amlodipine to 10 mg  Will forward to Dr Bjorn Pippin for review and recommendations ./cy

## 2019-10-27 NOTE — Telephone Encounter (Signed)
Agree with recommendations.  Recommend trying the new dose of carvedilol for next 2 weeks and calling with BP results at that time, can adjust medication further after 2 weeks

## 2019-10-27 NOTE — Telephone Encounter (Signed)
Patient made aware and verbalized understanding.

## 2019-10-28 ENCOUNTER — Other Ambulatory Visit (INDEPENDENT_AMBULATORY_CARE_PROVIDER_SITE_OTHER): Payer: Medicare Other

## 2019-10-28 DIAGNOSIS — I493 Ventricular premature depolarization: Secondary | ICD-10-CM

## 2019-11-21 ENCOUNTER — Telehealth: Payer: Self-pay | Admitting: Cardiology

## 2019-11-21 MED ORDER — AMLODIPINE BESYLATE 5 MG PO TABS: 10.0000 mg | ORAL_TABLET | Freq: Every day | ORAL | 3 refills | Status: DC

## 2019-11-21 NOTE — Telephone Encounter (Signed)
Pt c/o elevated BP, chest pain , and left arm discomfort after getting Covid shot. Pt states he received vaccine on 11/17/19 and became sweaty and had some chest pain after words.  Pt denies CP, and SOB at this time. States that his left arm feels "funny" . BP's are as follows 138\71.132/89,137/91; 117/93 67 ,116/98 64 Today 165/98 64. Pt has not taken meds for the morning yet. Please advise.

## 2019-11-21 NOTE — Telephone Encounter (Signed)
Spoke to patient, he took his AM meds after speaking with nurse earlier (approximately 2 hours ago).   Took BP while on the phone:  135/95 HR 61, states this is consistent for him after taking medications.   He is concerned about elevated blood pressure readings.  Denies CP, SOB, diaphoresis at current.   Advised would discuss with Dr. Bjorn Pippin and return call with recommendations.  Advised if he has another similar episode of chest pain to proceed to ER for evaluation.  Patient agreed and verbalized understanding.      Discussed with Dr. Celso Sickle increasing amlodipine to 10 mg and schedule OV with HTN clinic pharmD.      Spoke to patient, he states he increased his amlodipine to 10 mg 3 weeks ago due to elevated BPs and has not seen any change/improvement.    Med list updated.    Scheduled with HTN clinic tomorrow at 3:30 pm at [facility].    Advised would call back if Dr. Bjorn Pippin has further recommendations.  Patient aware.

## 2019-11-21 NOTE — Telephone Encounter (Signed)
Pt c/o BP issue: STAT if pt c/o blurred vision, one-sided weakness or slurred speech  1. What are your last 5 BP readings? 146/91 and 164/91. States it is ranging in the 140's-150's for the most part.   2. Are you having any other symptoms (ex. Dizziness, headache, blurred vision, passed out)? Indigestion, some dizziness, chest tightness.  3. What is your BP issue? BP has been elevated since he had his covid shot  Pt c/o of Chest Pain: STAT if CP now or developed within 24 hours  1. Are you having CP right now? no  2. Are you experiencing any other symptoms (ex. SOB, nausea, vomiting, sweating)? Sweating   3. How long have you been experiencing CP? Started yesterday.  4. Is your CP continuous or coming and going? Coming and going. States he walked it off.   5. Have you taken Nitroglycerin? no ?

## 2019-11-21 NOTE — Telephone Encounter (Signed)
Recommend taking his BP meds for today and seeing how his BP looks after his meds.  If further chest pain, would recommend ED evaluation

## 2019-11-22 ENCOUNTER — Other Ambulatory Visit: Payer: Self-pay

## 2019-11-22 ENCOUNTER — Ambulatory Visit (INDEPENDENT_AMBULATORY_CARE_PROVIDER_SITE_OTHER): Payer: Medicare Other | Admitting: Pharmacist Clinician (PhC)/ Clinical Pharmacy Specialist

## 2019-11-22 VITALS — BP 122/88 | HR 56 | Ht 72.0 in | Wt 287.4 lb

## 2019-11-22 DIAGNOSIS — I10 Essential (primary) hypertension: Secondary | ICD-10-CM | POA: Diagnosis not present

## 2019-11-22 NOTE — Progress Notes (Signed)
11/23/2019 Ricky [name] 1971/07/28 [id number]   HPI:  Ricky [name] is a 49 y.o. male patient of Dr Ricky [name],  who presents today for hypertension clinic evaluation.  In addition to hypertension, his medical history is significant for PVC's, thrombocytopenia and obstructive sleep apnea.   An Echo done in December also showed an ascending aortic aneurysm.   Also of note, he was found to have a low potassium level (3.4) and has managed to bring it up with 40 mEq supplementation daily.   The OSA was just recently diagnosed and he is now using CPAP 6-8 hours each night.  He was hopeful that this would help lower his BP, but has not noticed any significant drops since starting.   When he was started on carvedilol for the PVC's, his amlodipine was dropped from 10 to 5 mg daily.  About 3 weeks ago he increased the dose back to 10 mg, concerned that his pressure was not coming down.    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  Intolerances: nkda  Labs:  3/21:  Na 138, K 4.1, Glu 102, BUN 11, SCr 0.74  (note he takes KCl 40 mEq daily)  Last platelet count 157 (06/2019)  Wt Readings from Last 3 Encounters:  11/22/19 287 lb 6.4 oz (130.4 kg)  10/21/19 293 lb 12.8 oz (133.3 kg)  10/18/19 282 lb (127.9 kg)   BP  Readings from Last 3 Encounters:  11/22/19 122/88  10/21/19 137/90  08/30/19 (!) 126/94   Pulse Readings from Last 3 Encounters:  11/22/19 (!) 56  10/21/19 64  08/30/19 77    Current Outpatient Medications  Medication Sig Dispense Refill  . amLODipine (NORVASC) 5 MG tablet Take 2 tablets (10 mg total) by mouth daily. 90 tablet 3  . Azilsartan-Chlorthalidone 40-25 MG TABS Take by mouth.    . Blood Pressure Monitoring (BLOOD PRESSURE CUFF) MISC XL Adult blood pressure cuff 1 each 0  . carvedilol (COREG) 25 MG tablet Take 1 tablet (25 mg total) by mouth 2 (two) times daily. 180 tablet 3  . cyclobenzaprine (FLEXERIL) 10 MG tablet Take 1 tablet (10 mg total) by mouth 2 (two) times daily as needed for muscle spasms. 20 tablet 0  . Potassium Chloride ER 20 MEQ TBCR Take 20 mEq by mouth 2 (two) times daily. 180 tablet 3   No current facility-administered medications for this visit.    No Known Allergies  Past Medical History:  Diagnosis Date  . 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 Hypertension   . Thrombocytopenia ([facility]) 07/07/2012   138,000 08/26/11; 98,000 06/29/12    135 by palp 143/89  57  Second time w/rest 117/80 Blood pressure 122/88, pulse Marland Kitchen)  56, height 6' (1.829 m), weight 287 lb 6.4 oz (130.4 kg).  Hypertension Patient with mixed systolic/diastolic hypertension, still not controlled with maximum doses of 4 medications.  Because of his history of low potassium and PVC's, will do aldosterone/renin labs to check for hyperaldosteronism.  Will also check thyroid function and BMET.  Because lab is closed, patient states he will be back first thing in the morning.  He is very eager to get his pressure controlled.  No changes to his medications today, will wait for results of blood work before making further additions.  Most likely will add spironolactone, regardless of hyperaldosteronism test results, as we may be able to reduce his potassium  supplementation.  Will plan to see him in 3-4 weeks for follow up, but he understands we will call as the test results become available.     Ricky [name] PharmD CPP [facility] [address] [place], [place] [address] [phone]

## 2019-11-22 NOTE — Patient Instructions (Signed)
Return for a a follow up appointment   Go to the lab today for blood draw  We are going to test you for hyperaldosteronism and thyroid disease.  It will take about a week to get all the results back and we will call you once they are in.   Check your blood pressure at home daily and keep record of the readings.  Take your BP meds as follows:  Continue will all your current medications   Bring all of your meds, your BP cuff and your record of home blood pressures to your next appointment.  Exercise as you're able, try to walk approximately 30 minutes per day.  Keep salt intake to a minimum, especially watch canned and prepared boxed foods.  Eat more fresh fruits and vegetables and fewer canned items.  Avoid eating in fast food restaurants.    HOW TO TAKE YOUR BLOOD PRESSURE: . Rest 5 minutes before taking your blood pressure. .  Don't smoke or drink caffeinated beverages for at least 30 minutes before. . Take your blood pressure before (not after) you eat. . Sit comfortably with your back supported and both feet on the floor (don't cross your legs). . Elevate your arm to heart level on a table or a desk. . Use the proper sized cuff. It should fit smoothly and snugly around your bare upper arm. There should be enough room to slip a fingertip under the cuff. The bottom edge of the cuff should be 1 inch above the crease of the elbow. . Ideally, take 3 measurements at one sitting and record the average.

## 2019-11-23 NOTE — Assessment & Plan Note (Signed)
Patient with mixed systolic/diastolic hypertension, still not controlled with maximum doses of 4 medications.  Because of his history of low potassium and PVC's, will do aldosterone/renin labs to check for hyperaldosteronism.  Will also check thyroid function and BMET.  Because lab is closed, patient states he will be back first thing in the morning.  He is very eager to get his pressure controlled.  No changes to his medications today, will wait for results of blood work before making further additions.  Most likely will add spironolactone, regardless of hyperaldosteronism test results, as we may be able to reduce his potassium supplementation.  Will plan to see him in 3-4 weeks for follow up, but he understands we will call as the test results become available.

## 2019-11-29 LAB — TSH+FREE T4
Free T4: 1.13 ng/dL (ref 0.82–1.77)
TSH: 1.16 u[IU]/mL (ref 0.450–4.500)

## 2019-11-29 LAB — ALDOSTERONE + RENIN ACTIVITY W/ RATIO
ALDOS/RENIN RATIO: 7 (ref 0.0–30.0)
ALDOSTERONE: 7 ng/dL (ref 0.0–30.0)
Renin: 1 ng/mL/hr (ref 0.167–5.380)

## 2019-11-29 LAB — BASIC METABOLIC PANEL
BUN/Creatinine Ratio: 21 — ABNORMAL HIGH (ref 9–20)
BUN: 15 mg/dL (ref 6–24)
CO2: 24 mmol/L (ref 20–29)
Calcium: 9.3 mg/dL (ref 8.7–10.2)
Chloride: 102 mmol/L (ref 96–106)
Creatinine, Ser: 0.71 mg/dL — ABNORMAL LOW (ref 0.76–1.27)
GFR calc Af Amer: 126 mL/min/{1.73_m2} (ref >59)
GFR calc non Af Amer: 109 mL/min/{1.73_m2} (ref >59)
Glucose: 101 mg/dL — ABNORMAL HIGH (ref 65–99)
Potassium: 4 mmol/L (ref 3.5–5.2)
Sodium: 141 mmol/L (ref 134–144)

## 2019-12-01 ENCOUNTER — Encounter (HOSPITAL_COMMUNITY): Payer: Self-pay

## 2019-12-01 ENCOUNTER — Other Ambulatory Visit: Payer: Self-pay

## 2019-12-01 ENCOUNTER — Emergency Department (HOSPITAL_COMMUNITY)
Admission: EM | Admit: 2019-12-01 | Discharge: 2019-12-01 | Disposition: A | Discharge status: Home / Self Care | Payer: Medicare Other | Attending: Emergency Medicine | Admitting: Emergency Medicine

## 2019-12-01 DIAGNOSIS — M25561 Pain in right knee: Secondary | ICD-10-CM | POA: Diagnosis present

## 2019-12-01 DIAGNOSIS — Z87891 Personal history of nicotine dependence: Secondary | ICD-10-CM | POA: Insufficient documentation

## 2019-12-01 DIAGNOSIS — M79604 Pain in right leg: Secondary | ICD-10-CM

## 2019-12-01 DIAGNOSIS — Z79899 Other long term (current) drug therapy: Secondary | ICD-10-CM | POA: Insufficient documentation

## 2019-12-01 DIAGNOSIS — I10 Essential (primary) hypertension: Secondary | ICD-10-CM | POA: Diagnosis not present

## 2019-12-01 NOTE — ED Notes (Signed)
Patient verbalizes understanding of discharge instructions. Opportunity for questioning and answers were provided. Armband removed by staff, pt discharged from ED.  

## 2019-12-01 NOTE — ED Provider Notes (Signed)
[facility] EMERGENCY DEPARTMENT Provider Note   CSN: [id number] Arrival date & time: 12/01/19  1454     History Chief Complaint  Patient presents with  . Knee Pain    Ricky [name] is a 49 y.o. male.  HPI      Ricky [name] is a 49 y.o. male, with a history of HTN, thrombocytopenia, presenting to the ED "to make sure I do not have a blood clot." Patient had the [employer] Covid vaccine on April 1. This week, he read that some people who had gotten the vaccine had developed blood clots. While driving today, he had about 3 seconds of right upper lateral leg pain.  Later, he had momentary "poking sensations" in the abdomen that resolved quickly and did not recur. Denies history of PE/DVT, recent immobilization, recent trauma, recent surgery. He denies fever/chills, dizziness, syncope, chest pain, shortness of breath, cough, leg swelling/color change, numbness, weakness, N/V/D, back pain, urinary symptoms, falls/trauma, or any other complaints.     Past Medical History:  Diagnosis Date  . 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 Hypertension   . Thrombocytopenia ([facility]) 07/07/2012   138,000 08/26/11; 98,000 06/29/12    Patient Active Problem List   Diagnosis Date Noted  . OSA (obstructive sleep apnea) 10/18/2019  . PVC's (premature ventricular contractions) 08/30/2019  . Hypertension 08/18/2019  . Thrombocytopenia ([facility]) 07/07/2012  . 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 ADENOIDECTOMY    . ANTERIOR FUSION CERVICAL SPINE    . APPENDECTOMY    . HERNIA REPAIR    . IRRIGATION AND DEBRIDEMENT SEBACEOUS CYST     on scalp  . KNEE ARTHROSCOPY     x2  . LUMBAR FUSION    . POSTERIOR FUSION CERVICAL SPINE    . TONSILLECTOMY         History reviewed. No pertinent family history.  Social History   Tobacco Use  . 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  Smokeless tobacco: Never Used  Substance Use Topics  . Alcohol use: No  . Drug use: No    Home Medications Prior to Admission medications   Medication Sig Start Date End Date Taking? Authorizing Provider  amLODipine (NORVASC) 5 MG tablet Take 2 tablets (10 mg total) by mouth daily. 11/21/19 02/19/20  Little Ishikawa, MD  Azilsartan-Chlorthalidone 40-25 MG TABS Take by mouth.    [provider]  Blood Pressure Monitoring (BLOOD PRESSURE CUFF) MISC XL Adult blood pressure cuff 08/18/19   Little Ishikawa, MD  carvedilol (COREG) 25 MG tablet Take 1 tablet (25 mg total) by mouth 2 (two) times daily. 10/21/19 10/15/20  Little Ishikawa, MD  cyclobenzaprine (FLEXERIL) 10 MG tablet Take 1 tablet (10 mg total) by mouth 2 (two) times daily as needed for muscle spasms. 07/10/19   Alvira Monday, MD  Potassium Chloride ER 20 MEQ TBCR Take 20 mEq by mouth 2 (two) times daily. 08/24/19   Little Ishikawa, MD    Allergies    Patient has no known allergies.  Review of Systems   Review of Systems  Constitutional: Negative for chills, diaphoresis and fever.  Respiratory: Negative for cough and shortness of breath.   Cardiovascular: Negative for chest pain and leg swelling.  Gastrointestinal: Positive for abdominal pain (Momentary, resolved). Negative for blood in stool, diarrhea, nausea and vomiting.  Genitourinary: Negative for difficulty urinating, dysuria,  flank pain and frequency.  Musculoskeletal: Negative for back pain.  Neurological: Negative for dizziness, syncope, weakness, light-headedness and numbness.  All other systems reviewed and are negative.   Physical Exam Updated Vital Signs BP 126/80 (BP Location: Left Arm)   Pulse 61   Temp 98 F (36.7 C) (Oral)   Resp 18   Ht 6' (1.829 m)   Wt 129.3 kg   SpO2 96%   BMI 38.65 kg/m   Physical Exam Vitals and nursing note reviewed.  Constitutional:      General: He is not in acute distress.     Appearance: He is well-developed. He is obese. He is not diaphoretic.  HENT:     Head: Normocephalic and atraumatic.     Mouth/Throat:     Mouth: Mucous membranes are moist.     Pharynx: Oropharynx is clear.  Eyes:     Conjunctiva/sclera: Conjunctivae normal.  Cardiovascular:     Rate and Rhythm: Normal rate and regular rhythm.     Pulses: Normal pulses.          Radial pulses are 2+ on the right side and 2+ on the left side.       Dorsalis pedis pulses are 2+ on the right side and 2+ on the left side.       Posterior tibial pulses are 2+ on the right side and 2+ on the left side.     Heart sounds: Normal heart sounds.     Comments: Tactile temperature in the extremities appropriate and equal bilaterally. Pulmonary:     Effort: Pulmonary effort is normal. No respiratory distress.     Breath sounds: Normal breath sounds.  Abdominal:     Palpations: Abdomen is soft.     Tenderness: There is no abdominal tenderness. There is no guarding.  Musculoskeletal:     Cervical back: Neck supple.     Right lower leg: No edema.     Left lower leg: No edema.     Comments: No lower extremity edema, tenderness, erythema, increased warmth.  Lymphadenopathy:     Cervical: No cervical adenopathy.  Skin:    General: Skin is warm and dry.  Neurological:     Mental Status: He is alert.     Comments: No noted acute cognitive deficit. Sensation grossly intact to light touch in the extremities.   Grip strengths equal bilaterally.   Strength 5/5 in all extremities.  No gait disturbance.  Coordination intact.  Cranial nerves III-XII grossly intact.  Handles oral secretions without noted difficulty.  No noted phonation or speech deficit. No facial droop.   Psychiatric:        Mood and Affect: Mood and affect normal.        Speech: Speech normal.        Behavior: Behavior normal.     ED Results / Procedures / Treatments   Labs (all labs ordered are listed, but only abnormal results are  displayed) Labs Reviewed - No data to display  EKG None  Radiology No results found.  Procedures Procedures (including critical care time)  Medications Ordered in ED Medications - No data to display  ED Course  I have reviewed the triage vital signs and the nursing notes.  Pertinent labs & imaging results that were available during my care of the patient were reviewed by me and considered in my medical decision making (see chart for details).    MDM Rules/Calculators/A&P  Patient presents inquiring about possible blood clots to multiple areas of his body.  He had momentary pain in the abdomen and right leg that resolved and did not recur. My suspicion for blood clot or other thrombotic event is quite low based on consideration of the patient's vital signs, description of the pain, duration of the pain, and physical exam is not suggestive. The patient was given instructions for home care as well as return precautions. Patient voices understanding of these instructions, accepts the plan, and is comfortable with discharge.   Final Clinical Impression(s) / ED Diagnoses Final diagnoses:  Right leg pain    Rx / DC Orders ED Discharge Orders    None       Layla Maw 12/01/19 1807    Hayden Rasmussen, MD 12/02/19 1034

## 2019-12-01 NOTE — ED Triage Notes (Signed)
Pt arrives to ED w/ c/o R knee pain. Pt states he had J&J vaccine on 4/1 and wants to be sure he does not have a blood clot. Pt denies chest pain and SOB.

## 2019-12-01 NOTE — Discharge Instructions (Signed)
Vital signs and physical exam were reassuring. Return to the emergency department for sustained pain, abdominal pain, chest pain, shortness of breath, numbness, weakness, or any other major concerns.

## 2019-12-02 ENCOUNTER — Other Ambulatory Visit: Payer: Self-pay

## 2019-12-02 ENCOUNTER — Emergency Department (HOSPITAL_COMMUNITY)
Admission: EM | Admit: 2019-12-02 | Discharge: 2019-12-03 | Disposition: A | Discharge status: Home / Self Care | Payer: Medicare Other | Attending: Emergency Medicine | Admitting: Emergency Medicine

## 2019-12-02 ENCOUNTER — Telehealth: Payer: Self-pay | Admitting: Pharmacist Clinician (PhC)/ Clinical Pharmacy Specialist

## 2019-12-02 ENCOUNTER — Encounter (HOSPITAL_COMMUNITY): Payer: Self-pay | Admitting: Emergency Medicine

## 2019-12-02 ENCOUNTER — Emergency Department (HOSPITAL_COMMUNITY): Payer: Medicare Other

## 2019-12-02 DIAGNOSIS — Z79899 Other long term (current) drug therapy: Secondary | ICD-10-CM | POA: Diagnosis not present

## 2019-12-02 DIAGNOSIS — I10 Essential (primary) hypertension: Secondary | ICD-10-CM | POA: Diagnosis not present

## 2019-12-02 DIAGNOSIS — R109 Unspecified abdominal pain: Secondary | ICD-10-CM | POA: Diagnosis present

## 2019-12-02 DIAGNOSIS — Z87891 Personal history of nicotine dependence: Secondary | ICD-10-CM | POA: Diagnosis not present

## 2019-12-02 DIAGNOSIS — R1084 Generalized abdominal pain: Secondary | ICD-10-CM | POA: Insufficient documentation

## 2019-12-02 LAB — CBC
HCT: 45 % (ref 39.0–52.0)
Hemoglobin: 14.4 g/dL (ref 13.0–17.0)
MCH: 28.7 pg (ref 26.0–34.0)
MCHC: 32 g/dL (ref 30.0–36.0)
MCV: 89.8 fL (ref 80.0–100.0)
Platelets: 186 10*3/uL (ref 150–400)
RBC: 5.01 MIL/uL (ref 4.22–5.81)
RDW: 13.3 % (ref 11.5–15.5)
WBC: 7.8 10*3/uL (ref 4.0–10.5)
nRBC: 0 % (ref 0.0–0.2)

## 2019-12-02 LAB — BASIC METABOLIC PANEL
Anion gap: 9 (ref 5–15)
BUN: 14 mg/dL (ref 6–20)
CO2: 27 mmol/L (ref 22–32)
Calcium: 9.5 mg/dL (ref 8.9–10.3)
Chloride: 102 mmol/L (ref 98–111)
Creatinine, Ser: 0.74 mg/dL (ref 0.61–1.24)
GFR calc Af Amer: >60 mL/min (ref >60)
GFR calc non Af Amer: >60 mL/min (ref >60)
Glucose, Bld: 109 mg/dL — ABNORMAL HIGH (ref 70–99)
Potassium: 3.6 mmol/L (ref 3.5–5.1)
Sodium: 138 mmol/L (ref 135–145)

## 2019-12-02 LAB — TROPONIN I (HIGH SENSITIVITY)
Troponin I (High Sensitivity): 7 ng/L (ref <18)
Troponin I (High Sensitivity): 6 ng/L (ref <18)

## 2019-12-02 LAB — LIPASE, BLOOD: Lipase: 28 U/L (ref 11–51)

## 2019-12-02 MED ORDER — SPIRONOLACTONE 25 MG PO TABS: 12.5000 mg | ORAL_TABLET | Freq: Every day | ORAL | 3 refills | Status: DC

## 2019-12-02 MED ORDER — SODIUM CHLORIDE 0.9% FLUSH: 3.0000 mL | Freq: Once | INTRAVENOUS | Status: DC

## 2019-12-02 NOTE — Telephone Encounter (Signed)
Patient returning call to review labs.  Was negative for hyperaldosteronism, TSH WNL.  Pt reports home BP still running high, diastolic still always > 80, often > 90.   Will add spironolactone 12.5 mg daily and repeat BMET in 10-14 days.  Advised to continue with his potassium supplementation until then.    Patient voiced understanding.

## 2019-12-02 NOTE — ED Triage Notes (Signed)
Pt states he was seen yesterday for the abd pain and sent home, pt states today is worse and is having SOB and CP today 7/10.

## 2019-12-03 MED ORDER — DICYCLOMINE HCL 10 MG PO CAPS
10.0000 mg | ORAL_CAPSULE | Freq: Once | ORAL | Status: AC
  Administered 2019-12-03: 08:00:00 10 mg via ORAL
  Filled 2019-12-03: qty 1

## 2019-12-03 MED ORDER — DICYCLOMINE HCL 20 MG PO TABS: 20.0000 mg | ORAL_TABLET | Freq: Two times a day (BID) | ORAL | 0 refills | Status: DC | PRN

## 2019-12-03 MED ORDER — OMEPRAZOLE 20 MG PO CPDR: 20.0000 mg | DELAYED_RELEASE_CAPSULE | Freq: Every day | ORAL | 0 refills | Status: DC

## 2019-12-03 NOTE — Discharge Instructions (Addendum)
Please follow-up with your primary care provider regarding today's encounter.  Please also be sure to go to your cardiology appointment on Monday, 12/05/2019, as scheduled.  I prescribed you a course of Bentyl as well as proton pump inhibitors in an effort to improve your symptoms of abdominal cramping discomfort.  Please be sure to drink plenty of fluids.  You may also consider Gas-X and/or Maalox as an abortive agent.  Return to the ED or seek immediate medical attention should you experience any new or worsening symptoms.

## 2019-12-03 NOTE — ED Provider Notes (Signed)
[facility] EMERGENCY DEPARTMENT Provider Note   CSN: [id number] Arrival date & time: 12/02/19  1929     History Chief Complaint  Patient presents with  . Abdominal Pain  . Chest Pain    Ricky [name] is a 49 y.o. male with past medical history of HTN, OSA, 47 mm ascending thoracic aneurysm, and thrombocytopenia who presents to the ED for ongoing generalized abdominal cramping and symptoms of indigestion.  I reviewed patient's medical record and he was evaluated in ER on 12/01/2019 for concern for blood clots given that he had received the [facility] COVID-19 vaccine 11/17/2019.  Patient had been endorsing leg discomfort and abdominal discomfort, but denied any chest pain or shortness of breath symptoms.  Physical exam performed was not suspicious or concerning for blood clot and there is no imaging obtained.  Today, he states that he did experience a episode of sharp left-sided chest discomfort that quickly resolved.  It was accompanied with some shortness of breath symptoms, but by lying down he states that they quickly went away.  He is unsure as to whether or not that was a panic attack.  On my exam, patient is still endorsing some generalized abdominal cramping, but no focal areas of pain.  He states that he has a history of hypokalemia and has to take potassium supplements and is unsure as to whether or not electrolyte derangement is contributing to his cramping symptoms.  Leg pain is improved.  He believes his anxiety may be contributing to his concerns of blood clot, however he feels as though perhaps the true number of cases due to the vaccine has been concealed to general public.  He also felt a sharp pain to the side of his chest which resolved shortly after, but was obviously concerning to him.    HPI     Past Medical History:  Diagnosis Date  . 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 Hypertension   . Thrombocytopenia  (HCC) 07/07/2012   138,000 08/26/11; 98,000 06/29/12    Patient Active Problem List   Diagnosis Date Noted  . OSA (obstructive sleep apnea) 10/18/2019  . PVC's (premature ventricular contractions) 08/30/2019  . Hypertension 08/18/2019  . Thrombocytopenia (HCC) 07/07/2012  . 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 ADENOIDECTOMY    . ANTERIOR FUSION CERVICAL SPINE    . APPENDECTOMY    . HERNIA REPAIR    . IRRIGATION AND DEBRIDEMENT SEBACEOUS CYST     on scalp  . KNEE ARTHROSCOPY     x2  . LUMBAR FUSION    . POSTERIOR FUSION CERVICAL SPINE    . TONSILLECTOMY         No family history on file.  Social History   Tobacco Use  . 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 Smokeless tobacco: Never Used  Substance Use Topics  . Alcohol use: No  . Drug use: No    Home Medications Prior to Admission medications   Medication Sig Start Date End Date Taking? Authorizing Provider  amLODipine (NORVASC) 5 MG tablet Take 2 tablets (10 mg total) by mouth daily. 11/21/19 02/19/20 Yes Little Ishikawa, MD  Azilsartan-Chlorthalidone 40-25 MG TABS Take 1 tablet by mouth daily.    Yes [provider]  carvedilol (COREG) 25 MG tablet Take 1 tablet (25 mg total) by mouth 2 (two) times daily. 10/21/19 10/15/20 Yes Little Ishikawa,  MD  diphenhydramine-acetaminophen (TYLENOL PM) 25-500 MG TABS tablet Take 2 tablets by mouth at bedtime as needed (sleep).   Yes [provider]  Potassium Chloride ER 20 MEQ TBCR Take 20 mEq by mouth 2 (two) times daily. 08/24/19  Yes Donato Heinz, MD  Blood Pressure Monitoring (BLOOD PRESSURE CUFF) MISC XL Adult blood pressure cuff 08/18/19   Donato Heinz, MD  cyclobenzaprine (FLEXERIL) 10 MG tablet Take 1 tablet (10 mg total) by mouth 2 (two) times daily as needed for muscle spasms. Patient not taking: Reported on 12/03/2019 07/10/19   Gareth Morgan, MD  spironolactone  (ALDACTONE) 25 MG tablet Take 0.5 tablets (12.5 mg total) by mouth daily. 12/02/19 03/01/20  Donato Heinz, MD    Allergies    Patient has no known allergies.  Review of Systems   Review of Systems  All other systems reviewed and are negative.   Physical Exam Updated Vital Signs BP 113/87 (BP Location: Right Arm)   Pulse (!) 56   Temp 98.5 F (36.9 C) (Oral)   Resp 18   Ht 6' (1.829 m)   Wt 127 kg   SpO2 97%   BMI 37.97 kg/m   Physical Exam Vitals and nursing note reviewed. Exam conducted with a chaperone present.  Constitutional:      General: He is not in acute distress.    Appearance: Normal appearance. He is not ill-appearing.  HENT:     Head: Normocephalic and atraumatic.  Eyes:     General: No scleral icterus.    Conjunctiva/sclera: Conjunctivae normal.  Cardiovascular:     Rate and Rhythm: Normal rate and regular rhythm.     Pulses: Normal pulses.     Heart sounds: Normal heart sounds.  Pulmonary:     Effort: Pulmonary effort is normal. No respiratory distress.     Breath sounds: Normal breath sounds. No wheezing or rales.  Abdominal:     Comments: Soft, nondistended.  No areas of focal TTP on exam.  No guarding.  NABS.  Musculoskeletal:        General: Normal range of motion.     Cervical back: Normal range of motion. No rigidity.     Comments: No concerning unilateral extremity swelling.  Ambulates without difficulty.  Skin:    General: Skin is dry.     Capillary Refill: Capillary refill takes less than 2 seconds.  Neurological:     Mental Status: He is alert and oriented to person, place, and time.     GCS: GCS eye subscore is 4. GCS verbal subscore is 5. GCS motor subscore is 6.  Psychiatric:        Mood and Affect: Mood normal.        Behavior: Behavior normal.        Thought Content: Thought content normal.     ED Results / Procedures / Treatments   Labs (all labs ordered are listed, but only abnormal results are displayed) Labs  Reviewed  BASIC METABOLIC PANEL - Abnormal; Notable for the following components:      Result Value   Glucose, Bld 109 (*)    All other components within normal limits  CBC  LIPASE, BLOOD  TROPONIN I (HIGH SENSITIVITY)  TROPONIN I (HIGH SENSITIVITY)    EKG EKG Interpretation  Date/Time:  Friday December 02 2019 20:22:29 EDT Ventricular Rate:  67 PR Interval:  146 QRS Duration: 96 QT Interval:  376 QTC Calculation: 397 R Axis:   -11 Text Interpretation:  Normal sinus rhythm Incomplete right bundle branch block Minimal voltage criteria for LVH, may be normal variant ( R in aVL ) Borderline ECG No significant change since last tracing Confirmed by Drema Pry [phone]) on 12/03/2019 12:10:29 AM   Radiology DG Chest 2 View  Result Date: 12/02/2019 CLINICAL DATA:  Chest pain EXAM: CHEST - 2 VIEW COMPARISON:  07/10/2019 FINDINGS: Cardiomegaly. Both lungs are clear. Disc degenerative disease of the thoracic spine. IMPRESSION: Cardiomegaly without acute abnormality of the lungs. Electronically Signed   By: Lauralyn Primes M.D.   On: 12/02/2019 22:32    Procedures Procedures (including critical care time)  Medications Ordered in ED Medications  sodium chloride flush (NS) 0.9 % injection 3 mL (has no administration in time range)  dicyclomine (BENTYL) capsule 10 mg (10 mg Oral Given 12/03/19 0801)    ED Course  I have reviewed the triage vital signs and the nursing notes.  Pertinent labs & imaging results that were available during my care of the patient were reviewed by me and considered in my medical decision making (see chart for details).    MDM Rules/Calculators/A&P                      Patient's laboratory work-up today was entirely reassuring.  While he endorses some mild epigastric symptoms of "gas", he denies any significant discomfort on exam.  His lipase was normal at 28.  CBC and BMP were entirely unremarkable.  No right upper quadrant tenderness to palpation.  Initial  troponin was normal at 6 and there is no reason to trend given that his fleeting episode of chest discomfort resolved over 12 hours ago.  His EKG is interpreted as abnormal but, but consistent with his prior tracings.  Patient is following up with his cardiologist at noon on 12/05/2019.  Patient is obviously concerned about his 47 mm ascending thoracic aortic aneurysm.  However, pulses are equal bilaterally and his vital signs are reassuring here today.  He is not in any acute distress and I do not feel as though we need to perform a screening CT.  Patient is PERC negative.  No focal abdominal findings concerning for acute or emergent intra-abdominal pathology.  He admits to history of IBS, which could explain some of his cramping symptoms.  Will prescribe Bentyl.  Also encouraged him to take antacids such as omeprazole and Maalox as needed as an abortive agent.  Encouraging him to f/u with his primary care provider regarding today's encounter.  Patient has been notably anxious throughout my examination and has been concerned about mildly elevated blood pressures and borderline potassium levels.  He states that these anxiety symptoms stemming from when he was informed about his ascending thoracic aortic aneurysm.  This patient presents with abdominal pain of unclear etiology. Their evaluation has not identified a emergent etiology for the abdominal pain. Specifically, given the very benign exam, normal laboratory studies, and lack of significant risk factors, I have a very low suspicion for appendicitis, ischemic bowel, bowel perforation, or any other life threatening disease. I have discussed with the patient the level of uncertainty with undifferentiated abdominal pain and clearly explained the need to follow-up as noted on the discharge instructions, or return to the Emergency Department immediately if the pain worsens, develops fever, persistent and uncontrollable vomiting, or for any new symptoms or  concerns. I discussed with the patient that this presentation today for abdominal pain could represent a significant risk for an acute abdominal process.  Although the tests in the ED were essentially normal, there is still a possibility of a process such as appendicitis, diverticulitis, cholecystitis, ulcer, early bowel obstruction, mesenteric ischemia, kidney stone, or even kidney infection which could subsequently cause disability or death. The patient understands that they must return within 24 hours for a recheck or see their physician within 24 hours for re-exam due to the possibility of significant surgical or medical process.  Strict ED return precautions discussed with the patient.  All of the evaluation and work-up results were discussed with the patient and any family at bedside. They were provided opportunity to ask any additional questions and have none at this time. They have expressed understanding of verbal discharge instructions as well as return precautions and are agreeable to the plan.    Final Clinical Impression(s) / ED Diagnoses Final diagnoses:  Generalized abdominal pain    Rx / DC Orders ED Discharge Orders    None       Lorelee New, PA-C 12/03/19 0300    Pricilla Loveless, MD 12/05/19 1705

## 2019-12-03 NOTE — ED Notes (Signed)
Patient verbalizes understanding of discharge instructions . Opportunity for questions and answers were provided . Armband removed by staff ,Pt discharged from ED. W/C  offered at D/C  and Declined W/C at D/C and was escorted to lobby by RN.  

## 2019-12-05 ENCOUNTER — Institutional Professional Consult (permissible substitution): Payer: Medicare Other | Admitting: Cardiothoracic Surgery

## 2019-12-05 ENCOUNTER — Other Ambulatory Visit: Payer: Self-pay

## 2019-12-05 ENCOUNTER — Encounter (HOSPITAL_COMMUNITY): Payer: Self-pay

## 2019-12-05 ENCOUNTER — Other Ambulatory Visit: Payer: Self-pay | Admitting: Cardiothoracic Surgery

## 2019-12-05 VITALS — BP 121/87 | HR 63 | Temp 98.4°F | Resp 20 | Ht 72.0 in | Wt 280.0 lb

## 2019-12-05 DIAGNOSIS — I712 Thoracic aortic aneurysm, without rupture: Secondary | ICD-10-CM

## 2019-12-05 DIAGNOSIS — I7121 Aneurysm of the ascending aorta, without rupture: Secondary | ICD-10-CM

## 2019-12-06 ENCOUNTER — Telehealth: Payer: Self-pay | Admitting: Cardiology

## 2019-12-06 NOTE — Telephone Encounter (Signed)
Pt calling questioning what medication is he required to hold prior to test. Advise pt the only medication he should the morning of, is spironolactone.   Pt verbalized understanding.

## 2019-12-06 NOTE — Telephone Encounter (Signed)
New Message:    Pt says he is scheduled for a Cardiac CT on Thursday. He wants to know if he still take his medicine as usual?

## 2019-12-06 NOTE — Progress Notes (Signed)
[address]       [place] [address]             [phone]     CARDIOTHORACIC SURGERY CONSULTATION REPORT  Referring Provider is Little Ishikawa* Primary Cardiologist is No primary care provider on file. PCP is Starkes-Perry, Juel Burrow, FNP  Chief Complaint  Patient presents with  . Thoracic Aortic Aneurysm    Surgical eval, CTA Chest 08/05/19, ECHO 07/28/19    HPI:  49 year old gentleman is referred for evaluation of ascending aortic aneurysm.  He was in his usual state of health until this past year when he developed Covid respiratory infection.  He subsequently experienced chest pain and underwent echocardiography.  This showed an ascending aortic aneurysm which was followed up in December 2020 by CT angio again showing an ascending aneurysm measuring approximately 5 cm in maximal diameter.  He is referred for surgical consultation.  The patient does not know of family history related to aneurysm; he has no personal history of aneurysm.  Since being diagnosed with the disorder, he has experienced extreme stress and elevated blood pressure which has been difficult to control.  Intermittent chest pains as well.  Past Medical History:  Diagnosis Date  . 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 Hypertension   . Thrombocytopenia (HCC) 07/07/2012   138,000 08/26/11; 98,000 06/29/12    Past Surgical History:  Procedure Laterality Date  . ADENOIDECTOMY    . ANTERIOR FUSION CERVICAL SPINE    . APPENDECTOMY    . HERNIA REPAIR    . IRRIGATION AND DEBRIDEMENT SEBACEOUS CYST     on scalp  . KNEE ARTHROSCOPY     x2  . LUMBAR FUSION    . POSTERIOR FUSION CERVICAL SPINE    . TONSILLECTOMY      No family history on file.  Social History   Socioeconomic History  . Marital status: Single    Spouse name: Not on file  . Number of children: Not on file  . Years of education: Not on file  . Highest education  level: Not on file  Occupational History  . Not on file  Tobacco Use  . 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 Smokeless tobacco: Never Used  Substance and Sexual Activity  . Alcohol use: No  . Drug use: No  . Sexual activity: Not on file  Other Topics Concern  . Not on file  Social History Narrative  . Not on file   Social Determinants of Health   Financial Resource Strain:   . Difficulty of Paying Living Expenses:   Food Insecurity:   . Worried About Programme researcher, broadcasting/film/video in the Last Year:   . Barista in the Last Year:   Transportation Needs:   . Freight forwarder (Medical):   Marland Kitchen Lack of Transportation (Non-Medical):   Physical Activity:   . Days of Exercise per Week:   . Minutes of Exercise per Session:   Stress:   . Feeling of Stress :   Social Connections:   . Frequency of Communication with Friends and Family:   . Frequency of Social Gatherings with Friends and Family:   . Attends Religious Services:   . Active Member of Clubs or Organizations:   . Attends Banker Meetings:   Marland Kitchen Marital Status:   Intimate Partner Violence:   . Fear of Current or Ex-Partner:   .  Emotionally Abused:   Marland Kitchen Physically Abused:   . Sexually Abused:     Current Outpatient Medications  Medication Sig Dispense Refill  . amLODipine (NORVASC) 5 MG tablet Take 2 tablets (10 mg total) by mouth daily. 90 tablet 3  . Azilsartan-Chlorthalidone 40-25 MG TABS Take 1 tablet by mouth daily.     . Blood Pressure Monitoring (BLOOD PRESSURE CUFF) MISC XL Adult blood pressure cuff 1 each 0  . carvedilol (COREG) 25 MG tablet Take 1 tablet (25 mg total) by mouth 2 (two) times daily. 180 tablet 3  . cyclobenzaprine (FLEXERIL) 10 MG tablet Take 1 tablet (10 mg total) by mouth 2 (two) times daily as needed for muscle spasms. 20 tablet 0  . dicyclomine (BENTYL) 20 MG tablet Take 1 tablet (20 mg total) by mouth 2 (two) times daily between meals as needed for spasms. 20 tablet 0  .  diphenhydramine-acetaminophen (TYLENOL PM) 25-500 MG TABS tablet Take 2 tablets by mouth at bedtime as needed (sleep).    Marland Kitchen omeprazole (PRILOSEC) 20 MG capsule Take 1 capsule (20 mg total) by mouth daily. 30 capsule 0  . Potassium Chloride ER 20 MEQ TBCR Take 20 mEq by mouth 2 (two) times daily. 180 tablet 3  . spironolactone (ALDACTONE) 25 MG tablet Take 0.5 tablets (12.5 mg total) by mouth daily. 15 tablet 3   No current facility-administered medications for this visit.    No Known Allergies    Review of Systems:   General:  Reduced appetite, +weight gain,  Cardiac:  +chest pain with exertion and at rest,+ dizzy spells, Respiratory:  Denies symptoms  GI:   Denies symptoms  GU:   Denies symptoms  Vascular:  Denies symptoms  Neuro:   Denies symptoms   Musculoskeletal: History of arthritis, + joint swelling, endorses difficulty walking, reduced mobility   Skin:   Negative  Psych:   + anxiety/nervousness, + unusual recent stress especially since aneurysm diagnosis  Eyes:   Denies symptoms  ENT:   Denies symptoms  Hematologic:  History of thrombocytopenia  Endocrine:  Denies symptoms     Physical Exam:   BP 121/87   Pulse 63   Temp 98.4 F (36.9 C) (Skin)   Resp 20   Ht 6' (1.829 m)   Wt 127 kg   SpO2 98% Comment: RA  BMI 37.97 kg/m   General:    well-appearing  HEENT:  Unremarkable   Neck:   no JVD, no bruits, no adenopathy   Chest:   clear to auscultation, symmetrical breath sounds, no wheezes, no rhonchi   CV:   RRR, no  murmur   Abdomen:  soft, non-tender, no masses   Extremities:  warm, well-perfused, pulses , no LE edema  Rectal/GU  Deferred  Neuro:   Grossly non-focal and symmetrical throughout  Skin:   Clean and dry, no rashes, no breakdown   Diagnostic Tests:  I have personally reviewed his available imaging studies including CT angiogram from December 2020 and agree with their general interpretation.  I have presented these films and discussed them with  the patient who appears to understand the explanation provided   Impression:  49 year old gentleman with incidentally discovered ascending aortic aneurysm measuring just over 5 cm in diameter.  He has no known family history but he is quite nervous about the presence of the aneurysm and his hypertension is difficult to control.  Therefore, it may be worthwhile to consider elective surgery prior to aortic catastrophe   Plan:  Coronary CT scan as soon as can be scheduled Duplex examinations of the carotids and lower extremities as preoperative clearance Reevaluate in the outpatient setting once the studies are done to further discuss elective surgical intervention   I spent in excess of 30 minutes during the conduct of this office consultation and >50% of this time involved direct face-to-face encounter with the patient for counseling and/or coordination of their care.          Level 3 Office Consult = 40 minutes         Level 4 Office Consult = 60 minutes         Level 5 Office Consult = 80 minutes  B. Lorayne Marek, MD 12/06/2019 7:45 PM

## 2019-12-07 ENCOUNTER — Telehealth (HOSPITAL_COMMUNITY): Payer: Self-pay | Admitting: Emergency Medicine

## 2019-12-07 NOTE — Telephone Encounter (Signed)
Reaching out to patient to offer assistance regarding upcoming cardiac imaging study; pt verbalizes understanding of appt date/time, parking situation and where to check in, pre-test NPO status and medications ordered, and verified current allergies; name and call back number provided for further questions should they arise Estefanny Moler RN Navigator Cardiac [facility] [phone] office [phone] cell 

## 2019-12-08 ENCOUNTER — Ambulatory Visit (HOSPITAL_COMMUNITY)
Admission: RE | Admit: 2019-12-08 | Discharge: 2019-12-08 | Disposition: A | Discharge status: Home / Self Care | Payer: Medicare Other | Source: Ambulatory Visit | Attending: Cardiothoracic Surgery | Admitting: Cardiothoracic Surgery

## 2019-12-08 ENCOUNTER — Ambulatory Visit
Admission: RE | Admit: 2019-12-08 | Discharge: 2019-12-08 | Disposition: A | Discharge status: Home / Self Care | Payer: Medicare Other | Source: Ambulatory Visit | Attending: Cardiothoracic Surgery | Admitting: Cardiothoracic Surgery

## 2019-12-08 ENCOUNTER — Other Ambulatory Visit: Payer: Self-pay

## 2019-12-08 DIAGNOSIS — I712 Thoracic aortic aneurysm, without rupture, unspecified: Secondary | ICD-10-CM

## 2019-12-08 DIAGNOSIS — I7121 Aneurysm of the ascending aorta, without rupture: Secondary | ICD-10-CM

## 2019-12-08 DIAGNOSIS — I2510 Atherosclerotic heart disease of native coronary artery without angina pectoris: Secondary | ICD-10-CM | POA: Diagnosis not present

## 2019-12-08 MED ORDER — IOHEXOL 350 MG/ML SOLN
75.0000 mL | Freq: Once | INTRAVENOUS | Status: AC | PRN
  Administered 2019-12-08: 200 mL via INTRAVENOUS

## 2019-12-08 MED ORDER — NITROGLYCERIN 0.4 MG SL SUBL
0.8000 mg | SUBLINGUAL_TABLET | Freq: Once | SUBLINGUAL | Status: AC
  Administered 2019-12-08: 0.8 mg via SUBLINGUAL

## 2019-12-08 NOTE — Progress Notes (Signed)
Pre op ultrasound       has been completed. Preliminary results can be found under CV proc through chart review. Jeb Levering, BS, RDMS, RVT

## 2019-12-12 ENCOUNTER — Ambulatory Visit: Payer: Medicare Other | Admitting: Cardiothoracic Surgery

## 2019-12-14 ENCOUNTER — Other Ambulatory Visit: Payer: Self-pay

## 2019-12-14 ENCOUNTER — Ambulatory Visit: Payer: Medicare Other | Admitting: Cardiothoracic Surgery

## 2019-12-14 VITALS — BP 120/83 | HR 76 | Temp 97.6°F | Resp 20 | Ht 72.0 in | Wt 285.0 lb

## 2019-12-14 DIAGNOSIS — I712 Thoracic aortic aneurysm, without rupture: Secondary | ICD-10-CM | POA: Diagnosis not present

## 2019-12-14 DIAGNOSIS — I7121 Aneurysm of the ascending aorta, without rupture: Secondary | ICD-10-CM

## 2019-12-19 NOTE — Progress Notes (Signed)
[address]       [place] [address]             [phone]     CARDIOTHORACIC SURGERY OFFICE NOTE  Referring Provider is Little Ishikawa* Primary Cardiologist is No primary care provider on file. PCP is Maryagnes Amos, FNP   HPI:  49 yo man with incidentally discovered asc aortic aneurysm during CT this past winter. He was previously seen in this office for consideration of elective repair, especially since he had a lot of anxiety about the diagnosis which was in turn associated with increased hypertension. In the interim, he has had a coronary CT which shows smaller aneurysm likely due to cardiac gating. The patient is much relieved by the findings and report and says that his BP is a lot better controlled since then. Continues to deny chest pain.    Current Outpatient Medications  Medication Sig Dispense Refill  . amLODipine (NORVASC) 5 MG tablet Take 2 tablets (10 mg total) by mouth daily. 90 tablet 3  . Azilsartan-Chlorthalidone 40-25 MG TABS Take 1 tablet by mouth daily.     . Blood Pressure Monitoring (BLOOD PRESSURE CUFF) MISC XL Adult blood pressure cuff 1 each 0  . carvedilol (COREG) 25 MG tablet Take 1 tablet (25 mg total) by mouth 2 (two) times daily. 180 tablet 3  . cyclobenzaprine (FLEXERIL) 10 MG tablet Take 1 tablet (10 mg total) by mouth 2 (two) times daily as needed for muscle spasms. 20 tablet 0  . dicyclomine (BENTYL) 20 MG tablet Take 1 tablet (20 mg total) by mouth 2 (two) times daily between meals as needed for spasms. 20 tablet 0  . diphenhydramine-acetaminophen (TYLENOL PM) 25-500 MG TABS tablet Take 2 tablets by mouth at bedtime as needed (sleep).    Marland Kitchen omeprazole (PRILOSEC) 20 MG capsule Take 1 capsule (20 mg total) by mouth daily. 30 capsule 0  . Potassium Chloride ER 20 MEQ TBCR Take 20 mEq by mouth 2 (two) times daily. 180 tablet 3  . spironolactone (ALDACTONE) 25 MG tablet Take 0.5 tablets (12.5 mg total) by mouth  daily. 15 tablet 3   No current facility-administered medications for this visit.      Physical Exam:   BP 120/83   Pulse 76   Temp 97.6 F (36.4 C) (Skin)   Resp 20   Ht 6' (1.829 m)   Wt 129.3 kg   SpO2 95% Comment: RA  BMI 38.65 kg/m   General:  Well-appearing, NAD  Chest:   cta  CV:   rrr  Incisions:  n/a  Abdomen:  sntnd  Extremities:  No edema  Diagnostic Tests:  I have reviewed his most recent Coronary CT and agree with the interpretation. I have reviewed the images with the patient.    Impression:  49 yo with ascending aortic aneurysm not currently meeting criteria for elective repair  Plan:  F/u in 6 mos with repeat Chest CT Present for any severe and/or prolonged episode of chest pain or back pain suggestive of symptomatic aneurysm  I spent in excess of 20 minutes during the conduct of this office consultation and >50% of this time involved direct face-to-face encounter with the patient for counseling and/or coordination of their care.  Level 2                 10 minutes Level 3  15 minutes Level 4                 25 minutes Level 5                 40 minutes  B. Murvin Natal, MD 12/19/2019 12:09 PM

## 2019-12-20 ENCOUNTER — Other Ambulatory Visit: Payer: Self-pay

## 2019-12-20 ENCOUNTER — Ambulatory Visit (INDEPENDENT_AMBULATORY_CARE_PROVIDER_SITE_OTHER): Payer: Medicare Other | Admitting: Pharmacist

## 2019-12-20 VITALS — BP 130/68 | HR 64 | Temp 97.9°F | Ht 72.0 in | Wt 288.0 lb

## 2019-12-20 DIAGNOSIS — I10 Essential (primary) hypertension: Secondary | ICD-10-CM | POA: Diagnosis not present

## 2019-12-20 MED ORDER — SPIRONOLACTONE 25 MG PO TABS: 25.0000 mg | ORAL_TABLET | Freq: Every day | ORAL | 1 refills | Status: DC

## 2019-12-20 NOTE — Progress Notes (Signed)
HPI:  Ricky [name] is a 49 y.o. male patient of Dr Gardiner Rhyme,  who presents today for hypertension clinic follow up.  In addition to hypertension, his medical history is significant for PVC's, thrombocytopenia and obstructive sleep apnea.   An Echo done in December also showed an ascending aortic aneurysm.    The OSA was just recently diagnosed and he is now using CPAP 6-8 hours each night. Patient reports compliance with current therapy and CPAP. Denies adverse reactions, dizziness, swelling , chest pain or blurry vision.   Blood Pressure Goal: < 130/80  Current Medications:  amlodipine 10 mg daily Edarbyclor (azilsartan/chlorthalidone) 40/25 daily carvedilol 25 mg twice daily Spironolactone 12.5mg  daily  *DC potassium supplement*  Family Hx: fatther died at 47, no heart disease, MD; mother living at 41 with hypertension maybe 65 years; 2 sbs no issues; 2 sons, no indication of issue 80 and 10  Social Hx: no tobacco or alcohol; no caffeine; decaf coffee and tea, occasional sprite  Diet: mix of eat out and home, no salt at home; eats lots of vegetables; not big on sweets  Exercise: bad knees; stopped Flexagenics due to heart issues; ostoearthritis in Right knee, Baker cyst behind right knee  Home BP readings:  10 morning readings; average 127/82; HR 56-66 bpm 6 evening readings; average 127/84; HR 56-72bpm  Intolerances: nkda  Labs:   10/21/19:  Na 138, K 4.1, Glu 102, BUN 11, SCr 0.74  (note he takes KCl 40 mEq daily)  12/19/19: Na 138, K 4.3, Glu 90, BUN 18, Scr 0.82  Wt Readings from Last 3 Encounters:  12/20/19 288 lb (130.6 kg)  12/14/19 285 lb (129.3 kg)  12/05/19 280 lb (127 kg)   BP Readings from Last 3 Encounters:  12/20/19 130/68  12/14/19 120/83  12/08/19 111/74   Pulse Readings from Last 3 Encounters:  12/20/19 64  12/14/19 76  12/05/19 63    Current Outpatient Medications  Medication Sig Dispense Refill  . amLODipine (NORVASC) 5 MG tablet Take 2  tablets (10 mg total) by mouth daily. 90 tablet 3  . Azilsartan-Chlorthalidone 40-25 MG TABS Take 1 tablet by mouth daily.     . Blood Pressure Monitoring (BLOOD PRESSURE CUFF) MISC XL Adult blood pressure cuff 1 each 0  . carvedilol (COREG) 25 MG tablet Take 1 tablet (25 mg total) by mouth 2 (two) times daily. 180 tablet 3  . cyclobenzaprine (FLEXERIL) 10 MG tablet Take 1 tablet (10 mg total) by mouth 2 (two) times daily as needed for muscle spasms. 20 tablet 0  . dicyclomine (BENTYL) 20 MG tablet Take 1 tablet (20 mg total) by mouth 2 (two) times daily between meals as needed for spasms. 20 tablet 0  . diphenhydramine-acetaminophen (TYLENOL PM) 25-500 MG TABS tablet Take 2 tablets by mouth at bedtime as needed (sleep).    Marland Kitchen omeprazole (PRILOSEC) 20 MG capsule Take 1 capsule (20 mg total) by mouth daily. 30 capsule 0  . spironolactone (ALDACTONE) 25 MG tablet Take 1 tablet (25 mg total) by mouth daily. 90 tablet 0   No current facility-administered medications for this visit.    No Known Allergies  Past Medical History:  Diagnosis Date  . 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 Hypertension   . Thrombocytopenia ([place]) 07/07/2012   138,000 08/26/11; 98,000 06/29/12     Blood pressure 130/68, pulse 64, temperature 97.9 F (36.6 C), height 6' (1.829 m), weight 288  lb (130.6 kg), SpO2 98 %.  Hypertension Blood pressure greatly improved but diastolic remains slightly elevated at home. Noted K improve while taking spironolactone 12.5mg  and of Kcl daily. Will increase spironolactone to 25mg  daily and d/c potassium supplement for now. Plan to repeat BMET in 2 week and follow up at HTN in 4 weeks.   Luvinia Lucy Rodriguez-Guzman PharmD, BCPS, CPP [facility] [address] [place] 12/26/2019 7:01 PM

## 2019-12-20 NOTE — Patient Instructions (Addendum)
Return for a  follow up appointment in 4 weeks  Go to the lab in 2 weeks  Check your blood pressure at home daily (if able) and keep record of the readings.  Take your BP meds as follows: * STOP taking potassium supplement* *INCREASE spironolactone to 25mg  daily*  Bring all of your meds, your BP cuff and your record of home blood pressures to your next appointment.  Exercise as you're able, try to walk approximately 30 minutes per day.  Keep salt intake to a minimum, especially watch canned and prepared boxed foods.  Eat more fresh fruits and vegetables and fewer canned items.  Avoid eating in fast food restaurants.    HOW TO TAKE YOUR BLOOD PRESSURE: . Rest 5 minutes before taking your blood pressure. .  Don't smoke or drink caffeinated beverages for at least 30 minutes before. . Take your blood pressure before (not after) you eat. . Sit comfortably with your back supported and both feet on the floor (don't cross your legs). . Elevate your arm to heart level on a table or a desk. . Use the proper sized cuff. It should fit smoothly and snugly around your bare upper arm. There should be enough room to slip a fingertip under the cuff. The bottom edge of the cuff should be 1 inch above the crease of the elbow. . Ideally, take 3 measurements at one sitting and record the average.

## 2019-12-22 ENCOUNTER — Other Ambulatory Visit: Payer: Self-pay | Admitting: Cardiology

## 2019-12-23 LAB — ALDOSTERONE + RENIN ACTIVITY W/ RATIO
ALDOS/RENIN RATIO: 3.2 (ref 0.0–30.0)
ALDOSTERONE: 18 ng/dL (ref 0.0–30.0)
Renin: 5.701 ng/mL/hr — ABNORMAL HIGH (ref 0.167–5.380)

## 2019-12-23 LAB — BASIC METABOLIC PANEL
BUN/Creatinine Ratio: 18 (ref 9–20)
BUN: 15 mg/dL (ref 6–24)
CO2: 24 mmol/L (ref 20–29)
Calcium: 9.2 mg/dL (ref 8.7–10.2)
Chloride: 101 mmol/L (ref 96–106)
Creatinine, Ser: 0.82 mg/dL (ref 0.76–1.27)
GFR calc Af Amer: 119 mL/min/{1.73_m2} (ref >59)
GFR calc non Af Amer: 103 mL/min/{1.73_m2} (ref >59)
Glucose: 90 mg/dL (ref 65–99)
Potassium: 4.3 mmol/L (ref 3.5–5.2)
Sodium: 138 mmol/L (ref 134–144)

## 2019-12-26 ENCOUNTER — Encounter: Payer: Self-pay | Admitting: Pharmacist

## 2019-12-26 NOTE — Assessment & Plan Note (Addendum)
Blood pressure greatly improved but diastolic remains slightly elevated at home. Noted K improve while taking spironolactone 12.5mg  and of Kcl daily. Will increase spironolactone to 25mg  daily and d/c potassium supplement for now. Plan to repeat BMET in 2 week and follow up at HTN in 4 weeks.

## 2019-12-28 ENCOUNTER — Other Ambulatory Visit: Payer: Self-pay

## 2019-12-28 ENCOUNTER — Encounter: Payer: Self-pay | Admitting: Internal Medicine

## 2019-12-28 ENCOUNTER — Ambulatory Visit: Payer: Medicare Other | Admitting: Internal Medicine

## 2019-12-28 VITALS — BP 100/80 | HR 60 | Ht 72.0 in | Wt 285.0 lb

## 2019-12-28 DIAGNOSIS — I10 Essential (primary) hypertension: Secondary | ICD-10-CM | POA: Diagnosis not present

## 2019-12-28 DIAGNOSIS — I493 Ventricular premature depolarization: Secondary | ICD-10-CM

## 2019-12-28 NOTE — Patient Instructions (Signed)

## 2019-12-28 NOTE — Progress Notes (Signed)
HPI Ricky [name] returns today for followup of his PVC's. He is a pleasant 49 yo man with a h/o obesity, HTN, and PVC's. He had 22% PVC's and was placed on coreg and uptitrated and repeat heart monitor revealed 5% PVC's. He has been found to have an ascending aneurysm which appears to be stable. His coreg was uptitrated and his pressures are much improved. 100/70 by me today. He has been frustrated by his inability to lose weight.   No Known Allergies   Current Outpatient Medications  Medication Sig Dispense Refill  . amLODipine (NORVASC) 5 MG tablet Take 2 tablets (10 mg total) by mouth daily. 90 tablet 3  . Azilsartan-Chlorthalidone 40-25 MG TABS Take 1 tablet by mouth daily.     . Blood Pressure Monitoring (BLOOD PRESSURE CUFF) MISC XL Adult blood pressure cuff 1 each 0  . carvedilol (COREG) 25 MG tablet Take 1 tablet (25 mg total) by mouth 2 (two) times daily. 180 tablet 3  . cyclobenzaprine (FLEXERIL) 10 MG tablet Take 1 tablet (10 mg total) by mouth 2 (two) times daily as needed for muscle spasms. 20 tablet 0  . dicyclomine (BENTYL) 20 MG tablet Take 1 tablet (20 mg total) by mouth 2 (two) times daily between meals as needed for spasms. 20 tablet 0  . diphenhydramine-acetaminophen (TYLENOL PM) 25-500 MG TABS tablet Take 2 tablets by mouth at bedtime as needed (sleep).    Marland Kitchen omeprazole (PRILOSEC) 20 MG capsule Take 1 capsule (20 mg total) by mouth daily. 30 capsule 0  . spironolactone (ALDACTONE) 25 MG tablet Take 1 tablet (25 mg total) by mouth daily. 90 tablet 0   No current facility-administered medications for this visit.     Past Medical History:  Diagnosis Date  . 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 Hypertension   . Thrombocytopenia ([place]) 07/07/2012   138,000 08/26/11; 98,000 06/29/12    ROS:   All systems reviewed and negative except as noted in the HPI.   Past Surgical History:  Procedure Laterality Date  .  ADENOIDECTOMY    . ANTERIOR FUSION CERVICAL SPINE    . APPENDECTOMY    . HERNIA REPAIR    . IRRIGATION AND DEBRIDEMENT SEBACEOUS CYST     on scalp  . KNEE ARTHROSCOPY     x2  . LUMBAR FUSION    . POSTERIOR FUSION CERVICAL SPINE    . TONSILLECTOMY       History reviewed. No pertinent family history.   Social History   Socioeconomic History  . Marital status: Single    Spouse name: Not on file  . Number of children: Not on file  . Years of education: Not on file  . Highest education level: Not on file  Occupational History  . Not on file  Tobacco Use  . 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 Smokeless tobacco: Never Used  Substance and Sexual Activity  . Alcohol use: No  . Drug use: No  . Sexual activity: Not on file  Other Topics Concern  . Not on file  Social History Narrative  . Not on file   Social Determinants of Health   Financial Resource Strain:   . Difficulty of Paying Living Expenses:   Food Insecurity:   . Worried About Charity fundraiser in the Last Year:   . [place] in the Last Year:   Transportation Needs:   . Lack of  Transportation (Medical):   Marland Kitchen Lack of Transportation (Non-Medical):   Physical Activity:   . Days of Exercise per Week:   . Minutes of Exercise per Session:   Stress:   . Feeling of Stress :   Social Connections:   . Frequency of Communication with Friends and Family:   . Frequency of Social Gatherings with Friends and Family:   . Attends Religious Services:   . Active Member of Clubs or Organizations:   . Attends Banker Meetings:   Marland Kitchen Marital Status:   Intimate Partner Violence:   . Fear of Current or Ex-Partner:   . Emotionally Abused:   Marland Kitchen Physically Abused:   . Sexually Abused:      BP 100/80   Pulse 60   Ht 6' (1.829 m)   Wt 285 lb (129.3 kg)   SpO2 98%   BMI 38.65 kg/m   Physical Exam:  Well appearing NAD HEENT: Unremarkable Neck:  No JVD, no thyromegally Lymphatics:  No adenopathy Back:   No CVA tenderness Lungs:  Clear with no wheezes HEART:  Regular rate rhythm, no murmurs, no rubs, no clicks Abd:  soft, positive bowel sounds, no organomegally, no rebound, no guarding Ext:  2 plus pulses, no edema, no cyanosis, no clubbing Skin:  No rashes no nodules Neuro:  CN II through XII intact, motor grossly intact   Assess/Plan: 1. PVC's - he is improved. He will continue his coreg which I think is reducing his PVC's. 2. HTN -his bp is much improved.If he loses weight and his bp goes down, I would first stop amlodipine. 3. Obesity - his bmi is 66. He is encouraged to lose 30 lbs. 4. Chronic diastolic heart failure -his symptoms are class 2. He will continue his aldactone and chlorthalidone. He will maintain a low sodium diet.  Leonia Reeves.D.

## 2020-01-13 ENCOUNTER — Encounter: Payer: Self-pay | Admitting: Cardiovascular Disease

## 2020-01-13 ENCOUNTER — Other Ambulatory Visit: Payer: Self-pay

## 2020-01-13 ENCOUNTER — Ambulatory Visit: Payer: Medicare Other | Admitting: Cardiovascular Disease

## 2020-01-13 VITALS — BP 108/70 | HR 66 | Ht 68.0 in | Wt 286.0 lb

## 2020-01-13 DIAGNOSIS — I493 Ventricular premature depolarization: Secondary | ICD-10-CM

## 2020-01-13 DIAGNOSIS — I712 Thoracic aortic aneurysm, without rupture, unspecified: Secondary | ICD-10-CM

## 2020-01-13 DIAGNOSIS — G4733 Obstructive sleep apnea (adult) (pediatric): Secondary | ICD-10-CM | POA: Diagnosis not present

## 2020-01-13 DIAGNOSIS — I10 Essential (primary) hypertension: Secondary | ICD-10-CM

## 2020-01-13 DIAGNOSIS — G4736 Sleep related hypoventilation in conditions classified elsewhere: Secondary | ICD-10-CM

## 2020-01-13 DIAGNOSIS — IMO0002 Reserved for concepts with insufficient information to code with codable children: Secondary | ICD-10-CM

## 2020-01-13 NOTE — Patient Instructions (Signed)

## 2020-01-19 ENCOUNTER — Ambulatory Visit (INDEPENDENT_AMBULATORY_CARE_PROVIDER_SITE_OTHER): Payer: Medicare Other | Admitting: Pharmacist Clinician (PhC)/ Clinical Pharmacy Specialist

## 2020-01-19 ENCOUNTER — Other Ambulatory Visit: Payer: Self-pay

## 2020-01-19 ENCOUNTER — Encounter: Payer: Self-pay | Admitting: Cardiovascular Disease

## 2020-01-19 DIAGNOSIS — I10 Essential (primary) hypertension: Secondary | ICD-10-CM | POA: Diagnosis not present

## 2020-01-19 LAB — BASIC METABOLIC PANEL
BUN/Creatinine Ratio: 23 — ABNORMAL HIGH (ref 9–20)
BUN: 20 mg/dL (ref 6–24)
CO2: 24 mmol/L (ref 20–29)
Calcium: 9.1 mg/dL (ref 8.7–10.2)
Chloride: 101 mmol/L (ref 96–106)
Creatinine, Ser: 0.87 mg/dL (ref 0.76–1.27)
GFR calc Af Amer: 116 mL/min/{1.73_m2} (ref >59)
GFR calc non Af Amer: 101 mL/min/{1.73_m2} (ref >59)
Glucose: 148 mg/dL — ABNORMAL HIGH (ref 65–99)
Potassium: 4.3 mmol/L (ref 3.5–5.2)
Sodium: 139 mmol/L (ref 134–144)

## 2020-01-19 NOTE — Patient Instructions (Signed)
Return for a a follow up appointment in 3 months  Check your blood pressure at home several days each week and keep record of the readings.  Take your BP meds as follows:  Continue with all current medications  Try famotidine (Pepcid) for acid reflux - can be taken daily or just as needed  Work on weight loss - eating well and increasing exercise.  Be sure to stay well hydrated.  As you lose weight, we may be able to decrease your medications.  If you decide that you wish to have help with this, please let us know, as we can set you up with [facility]  Bring all of your meds, your BP cuff and your record of home blood pressures to your next appointment.  Exercise as you're able, try to walk approximately 30 minutes per day.  Keep salt intake to a minimum, especially watch canned and prepared boxed foods.  Eat more fresh fruits and vegetables and fewer canned items.  Avoid eating in fast food restaurants.    HOW TO TAKE YOUR BLOOD PRESSURE: . Rest 5 minutes before taking your blood pressure. .  Don't smoke or drink caffeinated beverages for at least 30 minutes before. . Take your blood pressure before (not after) you eat. . Sit comfortably with your back supported and both feet on the floor (don't cross your legs). . Elevate your arm to heart level on a table or a desk. . Use the proper sized cuff. It should fit smoothly and snugly around your bare upper arm. There should be enough room to slip a fingertip under the cuff. The bottom edge of the cuff should be 1 inch above the crease of the elbow. . Ideally, take 3 measurements at one sitting and record the average.

## 2020-01-19 NOTE — Progress Notes (Signed)
HPI:  Ricky [name] is a 49 y.o. male patient of Dr Ricky Pippin,  who presents today for hypertension clinic follow up.  In addition to hypertension, his medical history is significant for PVC's, thrombocytopenia and obstructive sleep apnea.   An Echo done in December also showed an ascending aortic aneurysm.  The OSA was just recently diagnosed and he is now using CPAP 6-8 hours each night.  Patient reports compliance with current therapy and CPAP. Denies adverse reactions, dizziness, swelling , chest pain or blurry vision.     At his last CVRR visit in early May his pressure was improved at 130/68. His spironolactone was increased to 25 mg daily and potassium supplement was stopped.  Patient noted that he developed cramping in his legs when the potassium was stopped, so he went back to just 20 mEq (was previously on 40 mEq).  He is not sure if this is helpful or cramping is due to long term issues with muscle spasms.       Started walking - got injection in knee- doing better, mowed lawn for fist time with push mower  Blood Pressure Goal: < 130/80  Current Medications:  amlodipine 10 mg daily Edarbyclor (azilsartan/chlorthalidone) 40/25 daily carvedilol 25 mg twice daily Spironolactone 25 mg daily  Family Hx: fatther died at 55, no heart disease, MD; mother living at 93 with hypertension maybe 20 years; 2 sbs no issues; 2 sons, no indication of issue 54 and 60  Social Hx: no tobacco or alcohol; no caffeine; decaf coffee and tea, occasional sprite  Diet: mix of eat out and home, no salt at home; eats lots of vegetables; not big on sweets  Exercise: bad knees; however recently received injection in one and doing better.  Was able to mow lawn (push not riding) for first time recently  Home BP readings:  20 morning readings; 132/79, previous average 127/82; HR 50-70 bpm 4 evening readings; average 121/81 previous average 127/84; HR 56-65 bpm  Intolerances: nkda  Labs:   10/21/19:  Na  138, K 4.1, Glu 102, BUN 11, SCr 0.74  (note he takes KCl 40 mEq daily)  12/19/19: Na 138, K 4.3, Glu 90, BUN 18, Scr 0.82 01/18/20:  Na 139, K 4.3, Glu 148, BUN 20, SCr 0.87  Wt Readings from Last 3 Encounters:  01/19/20 286 lb 6.4 oz (129.9 kg)  01/13/20 286 lb (129.7 kg)  12/28/19 285 lb (129.3 kg)   BP Readings from Last 3 Encounters:  01/19/20 102/78  01/13/20 108/70  12/28/19 100/80   Pulse Readings from Last 3 Encounters:  01/19/20 64  01/13/20 66  12/28/19 60    Current Outpatient Medications  Medication Sig Dispense Refill  . amLODipine (NORVASC) 5 MG tablet Take 2 tablets (10 mg total) by mouth daily. 90 tablet 3  . Azilsartan-Chlorthalidone 40-25 MG TABS Take 1 tablet by mouth daily.     . Blood Pressure Monitoring (BLOOD PRESSURE CUFF) MISC XL Adult blood pressure cuff 1 each 0  . carvedilol (COREG) 25 MG tablet Take 1 tablet (25 mg total) by mouth 2 (two) times daily. 180 tablet 3  . cyclobenzaprine (FLEXERIL) 10 MG tablet Take 1 tablet (10 mg total) by mouth 2 (two) times daily as needed for muscle spasms. 20 tablet 0  . dicyclomine (BENTYL) 20 MG tablet Take 1 tablet (20 mg total) by mouth 2 (two) times daily between meals as needed for spasms. 20 tablet 0  . omeprazole (PRILOSEC) 20 MG  capsule Take 1 capsule (20 mg total) by mouth daily. 30 capsule 0  . potassium chloride (KLOR-CON) 10 MEQ tablet Take 10 mEq by mouth 2 (two) times daily.    Marland Kitchen spironolactone (ALDACTONE) 25 MG tablet Take 1 tablet (25 mg total) by mouth daily. 90 tablet 0  . sildenafil (VIAGRA) 100 MG tablet Take 1/2 to 1 tablet as needed. 10 tablet 0   No current facility-administered medications for this visit.    No Known Allergies  Past Medical History:  Diagnosis Date  . 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 Hypertension   . Thrombocytopenia ([place]) 07/07/2012   138,000 08/26/11; 98,000 06/29/12     Blood pressure 102/78, pulse 64, temperature 98 F  (36.7 C), resp. rate 16, height 6' (1.829 m), weight 286 lb 6.4 oz (129.9 kg), SpO2 95 %.  Hypertension Patient with essential hypertension, now doing well on multiple medications.  Patient is encouraged to continue with current regimen and home monitoring.   His goal is to increase exercise and adjust diet.   His hope is to be able to decrease the need for 5 medications to control his pressure.  Clarified with him that this is a slow and ongoing process.  It will probably take 9-12 months of steady weight loss and lifestyle changes for this to occur and he understands this.  Has developed issues with decreased libido/ED with medications.  Reviewed with Dr. Gardiner Rhyme and will give patient prescription for sildenafil 100 mg to take 1/2-1 prn.  He was also concerned about some ongoing reflux, so it was recommended that he try some over the counter famotidine for this.  We will see him in 3 months for follow up and to help keep him on track with his goals.    Tommy Medal PharmD CPP [facility] [address] [place] [address] 01/22/2020 1:07 PM

## 2020-01-19 NOTE — Progress Notes (Signed)
Cardiology Office Note    Date:  01/19/2020   ID:  [name], DOB 02-Jan-1971, MRN [id number]  PCP:  Suella Broad, FNP  Cardiologist:  Shelva Majestic, MD (sleep): Dr. Darcel Bayley  New sleep evaluation  History of Present Illness:  Ricky [name] is a 49 y.o. male followed by Dr. Nechama Guard for cardiology care.  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  Due to concerns for obstructive sleep apnea, he was referred for a diagnostic polysomnogram on September 06, 2019.  This revealed moderate sleep apnea with an AHI at 15.0 and RDI of 17.  He had severe sleep apnea during REM sleep with an AHI of 48/h and with supine sleep with an AHI of 56.8/h.  There was evidence for loud snoring and significant oxygen desaturation to a nadir of 77%.  He subsequently was referred for CPAP titration trial on October 18, 2019.  CPAP was titrated up to optimal pressure at 9 cm.  AHI was 0 with O2 nadir at 91%.  He was subsequently set up with CPAP therapy with [facility] as his DME company.  Prior to treatment, he admitted to snoring, nocturia several times per night, daytime sleepiness, and nonrestorative sleep.  Since initiating CPAP therapy he has felt improved.  Typically he goes to bed between 9 and 11 PM and wakes up approximately 7 to 7:30 AM.  He has had issues with his back and has undergone neck surgery as well as low back surgery by Dr. Carloyn Manner.  He also has muscle spasms to his back at times making sleep difficult.  A download was obtained in the office today from December 14, 2019 through Jan 12, 2020 which confirms 100% usage.  Sleep duration is on average 5 hours and 59 minutes.  At his 9 cm set pressure, AHI is 5.8.  There is no significant mask leak.  He has been using a ResMed AirFit F 20 mask which he feels he tolerates well.   He denies any bruxism, restless legs, hypnagogic  hallucinations, or cataplectic events.  An Epworth Sleepiness Scale score was calculated in the office today and this endorsed at 9, as shown below:  Epworth Sleepiness Scale: Situation   Chance of Dozing/Sleeping (0 = never , 1 = slight chance , 2 = moderate chance , 3 = high chance )   sitting and reading 1   watching TV 1   sitting inactive in a public place 0   being a passenger in a motor vehicle for an hour or more 2   lying down in the afternoon 3   sitting and talking to someone 1   sitting quietly after lunch (no alcohol) 1   while stopped for a few minutes in traffic as the driver 0   Total Score  9   He denies chest pain.  He denies any significant increase in palpitations on his medical regimen with carvedilol 25 mg twice a day he continues to be on amlodipine 5 mg, and azilsartan chlorthalidone 40/25 mg in addition to spironolactone for hypertension.  He presents for his initial evaluation with me.   Past Medical History:  Diagnosis Date  . 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 Hypertension   . Thrombocytopenia ([place]) 07/07/2012   138,000 08/26/11; 98,000 06/29/12    Past Surgical History:  Procedure Laterality  Date  . ADENOIDECTOMY    . ANTERIOR FUSION CERVICAL SPINE    . APPENDECTOMY    . HERNIA REPAIR    . IRRIGATION AND DEBRIDEMENT SEBACEOUS CYST     on scalp  . KNEE ARTHROSCOPY     x2  . LUMBAR FUSION    . POSTERIOR FUSION CERVICAL SPINE    . TONSILLECTOMY      Current Medications: Outpatient Medications Prior to Visit  Medication Sig Dispense Refill  . amLODipine (NORVASC) 5 MG tablet Take 2 tablets (10 mg total) by mouth daily. 90 tablet 3  . Azilsartan-Chlorthalidone 40-25 MG TABS Take 1 tablet by mouth daily.     . Blood Pressure Monitoring (BLOOD PRESSURE CUFF) MISC XL Adult blood pressure cuff 1 each 0  . carvedilol (COREG) 25 MG tablet Take 1 tablet (25 mg total) by mouth 2 (two) times daily. 180 tablet 3  .  cyclobenzaprine (FLEXERIL) 10 MG tablet Take 1 tablet (10 mg total) by mouth 2 (two) times daily as needed for muscle spasms. 20 tablet 0  . dicyclomine (BENTYL) 20 MG tablet Take 1 tablet (20 mg total) by mouth 2 (two) times daily between meals as needed for spasms. 20 tablet 0  . omeprazole (PRILOSEC) 20 MG capsule Take 1 capsule (20 mg total) by mouth daily. 30 capsule 0  . spironolactone (ALDACTONE) 25 MG tablet Take 1 tablet (25 mg total) by mouth daily. 90 tablet 0  . diphenhydramine-acetaminophen (TYLENOL PM) 25-500 MG TABS tablet Take 2 tablets by mouth at bedtime as needed (sleep).     No facility-administered medications prior to visit.     Allergies:   Patient has no known allergies.   Social History   Socioeconomic History  . Marital status: Single    Spouse name: Not on file  . Number of children: Not on file  . Years of education: Not on file  . Highest education level: Not on file  Occupational History  . Not on file  Tobacco Use  . 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 Smokeless tobacco: Never Used  Substance and Sexual Activity  . Alcohol use: No  . Drug use: No  . Sexual activity: Not on file  Other Topics Concern  . Not on file  Social History Narrative  . Not on file   Social Determinants of Health   Financial Resource Strain:   . Difficulty of Paying Living Expenses:   Food Insecurity:   . Worried About Charity fundraiser in the Last Year:   . Arboriculturist in the Last Year:   Transportation Needs:   . Film/video editor (Medical):   Ricky [name] Lack of Transportation (Non-Medical):   Physical Activity:   . Days of Exercise per Week:   . Minutes of Exercise per Session:   Stress:   . Feeling of Stress :   Social Connections:   . Frequency of Communication with Friends and Family:   . Frequency of Social Gatherings with Friends and Family:   . Attends Religious Services:   . Active Member of Clubs or Organizations:   . Attends Archivist  Meetings:   Ricky [name] Marital Status:     Socially he is single and has 2 children ages 65 and 55.  He lives with his son.  Family History: Family history is notable in that his father is deceased and died at age 56 and had muscular dystrophy and dementia. Mother is alive at 57 and had a  history of PE and Covid.  1 brother age 7 has sleep apnea.  He has 1 sister.  ROS General: Negative; No fevers, chills, or night sweats;  HEENT: Negative; No changes in vision or hearing, sinus congestion, difficulty swallowing Pulmonary: Negative; No cough, wheezing, shortness of breath, hemoptysis Cardiovascular: Positive for hypertension, palpitations, PVCs/SVT, a sending aortic aneurysm GI: Negative; No nausea, vomiting, diarrhea, or abdominal pain GU: Negative; No dysuria, hematuria, or difficulty voiding Musculoskeletal: Negative; no myalgias, joint pain, or weakness Hematologic/Oncology: Negative; no easy bruising, bleeding Endocrine: Negative; no heat/cold intolerance; no diabetes Neuro: Negative; no changes in balance, headaches Skin: Negative; No rashes or skin lesions Psychiatric: Negative; No behavioral problems, depression Sleep:  Other comprehensive 14 point system review is negative.   PHYSICAL EXAM:   VS:  BP 108/70   Pulse 66   Ht '5\' 8"'  (1.727 m)   Wt 286 lb (129.7 kg)   SpO2 99%   BMI 43.49 kg/m     Repeat blood pressure by me was 110/70  Wt Readings from Last 3 Encounters:  01/19/20 286 lb 6.4 oz (129.9 kg)  01/13/20 286 lb (129.7 kg)  12/28/19 285 lb (129.3 kg)    General: Alert, oriented, no distress.  Skin: normal turgor, no rashes, warm and dry HEENT: Normocephalic, atraumatic. Pupils equal round and reactive to light; sclera anicteric; extraocular muscles intact;  Nose without nasal septal hypertrophy Mouth/Parynx benign; Mallinpatti scale 2/3 Neck: No JVD, no carotid bruits; normal carotid upstroke Lungs: clear to ausculatation and percussion; no wheezing or  rales Chest wall: without tenderness to palpitation Heart: PMI not displaced, RRR, s1 s2 normal, 1/6 systolic murmur, no diastolic murmur, no rubs, gallops, thrills, or heaves Abdomen: Moderate central adiposity soft, nontender; no hepatosplenomehaly, BS+; abdominal aorta nontender and not dilated by palpation. Back: no CVA tenderness Pulses 2+ Musculoskeletal: full range of motion, normal strength, no joint deformities Extremities: no clubbing cyanosis or edema, Homan's sign negative  Neurologic: grossly nonfocal; Cranial nerves grossly wnl Psychologic: Normal mood and affect   Studies/Labs Reviewed:   EKG:  EKG is not  ordered today.  I personally reviewed his ECG from December 02, 2019 which showed normal sinus rhythm at 67 with incomplete right bundle branch block.  Recent Labs: BMP Latest Ref Rng & Units 01/18/2020 12/19/2019 12/02/2019  Glucose 65 - 99 mg/dL 148(H) 90 109(H)  BUN 6 - 24 mg/dL '20 15 14  ' Creatinine 0.76 - 1.27 mg/dL 0.87 0.82 0.74  BUN/Creat Ratio 9 - 20 23(H) 18 -  Sodium 134 - 144 mmol/L 139 138 138  Potassium 3.5 - 5.2 mmol/L 4.3 4.3 3.6  Chloride 96 - 106 mmol/L 101 101 102  CO2 20 - 29 mmol/L '24 24 27  ' Calcium 8.7 - 10.2 mg/dL 9.1 9.2 9.5     Hepatic Function Latest Ref Rng & Units 07/10/2019  Total Protein 6.5 - 8.1 g/dL 7.4  Albumin 3.5 - 5.0 g/dL 3.7  AST 15 - 41 U/L 37  ALT 0 - 44 U/L 23  Alk Phosphatase 38 - 126 U/L 68  Total Bilirubin 0.3 - 1.2 mg/dL 0.8  Bilirubin, Direct 0.0 - 0.2 mg/dL 0.2    CBC Latest Ref Rng & Units 12/02/2019 07/10/2019 07/30/2012  WBC 4.0 - 10.5 K/uL 7.8 8.5 5.7  Hemoglobin 13.0 - 17.0 g/dL 14.4 15.1 15.4  Hematocrit 39.0 - 52.0 % 45.0 45.6 46.5  Platelets 150 - 400 K/uL 186 157 103(L)   Lab Results  Component Value Date  MCV 89.8 12/02/2019   MCV 89.8 07/10/2019   MCV 89.8 07/30/2012   Lab Results  Component Value Date   TSH 1.160 11/23/2019   No results found for: HGBA1C   BNP No results found for:  BNP  ProBNP No results found for: PROBNP   Lipid Panel  No results found for: CHOL, TRIG, HDL, CHOLHDL, VLDL, LDLCALC, LDLDIRECT, LABVLDL   RADIOLOGY: No results found.   Additional studies/ records that were reviewed today include:   CPAP TITRATION STUDY:  10/18/2019 CLINICAL INFORMATION The patient is referred for a CPAP titration to treat sleep apnea.  Date of NPSG: 09/06/2019: AHI 15.0; RDI 17; REM AHI 48.0/h; supine AHI 56.8/h: loud snoring; O2 desaturation to 77%.  SLEEP STUDY TECHNIQUE As per the AASM Manual for the Scoring of Sleep and Associated Events v2.3 (April 2016) with a hypopnea requiring 4% desaturations.  The channels recorded and monitored were frontal, central and occipital EEG, electrooculogram (EOG), submentalis EMG (chin), nasal and oral airflow, thoracic and abdominal wall motion, anterior tibialis EMG, snore microphone, electrocardiogram, and pulse oximetry. Continuous positive airway pressure (CPAP) was initiated at the beginning of the study and titrated to treat sleep-disordered breathing.  MEDICATIONS amLODipine (NORVASC) 5 MG tablet Azilsartan-Chlorthalidone 40-25 MG TABS Blood Pressure Monitoring (BLOOD PRESSURE CUFF) MISC carvedilol (COREG) 25 MG tablet cyclobenzaprine (FLEXERIL) 10 MG tablet Potassium Chloride ER 20 MEQ TBCR  Medications self-administered by patient taken the night of the study : POTASSIUM CHLORIDE, CARVEDILOL  TECHNICIAN COMMENTS Comments added by technician: Patient was restless all through the night. Comments added by scorer: N/A  RESPIRATORY PARAMETERS Optimal PAP Pressure (cm):  9          AHI at Optimal Pressure (/hr):            1.7 Overall Minimal O2 (%):         88.0     Supine % at Optimal Pressure (%):    2 Minimal O2 at Optimal Pressure (%): 91.0       SLEEP ARCHITECTURE The study was initiated at 10:27:22 PM and ended at 4:54:53 AM.  Sleep onset time was 12.3 minutes and the sleep efficiency was  74.4%%. The total sleep time was 288.5 minutes.  The patient spent 15.6%% of the night in stage N1 sleep, 73.8%% in stage N2 sleep, 0.0%% in stage N3 and 10.6% in REM.Stage REM latency was 115.0 minutes  Wake after sleep onset was 86.7. Alpha intrusion was absent. Supine sleep was 16.64%.  CARDIAC DATA The 2 lead EKG demonstrated sinus rhythm. The mean heart rate was 61.9 beats per minute. Other EKG findings include: PVCs.  LEG MOVEMENT DATA The total Periodic Limb Movements of Sleep (PLMS) were 0. The PLMS index was 0.0. A PLMS index of <15 is considered normal in adults.  IMPRESSIONS - CPAP was initiated at 6 cm and was titrated to optimal PAP pressure at 9 cm of water; AHI 0; O2 nadir 91%. - Central sleep apnea was not noted during this titration (CAI = 0.6/h). - Mild oxygen desaturations to a nadir of 88.0% at 8 cm. - The patient snored with soft snoring volume during this titration study. - 2-lead EKG demonstrated: occasional PVCs - Clinically significant periodic limb movements were not noted during this study. Arousals associated with PLMs were rare.  DIAGNOSIS - Obstructive Sleep Apnea (327.23 [G47.33 ICD-10])  RECOMMENDATIONS - Recommend an initial trial of CPAP therapy on 9 cm H2O with heated humidification.  A Medium Wide size [employer]  Full Face Mask Dreamwear mask was used for the titration. - Effort should be made to optimize nasal and oropharyngeal patency. - Patient should be counseled to avoid supine sleep. - Avoid alcohol, sedatives and other CNS depressants that may worsen sleep apnea and disrupt normal sleep architecture. - Sleep hygiene should be reviewed to assess factors that may improve sleep quality. - Weight management and regular exercise should be initiated or continued. - Recommend a download in 30 days and sleep clinic evaluation after 4 weeks of therapy.   [Electronically signed] 10/24/2019 04:21 PM    ASSESSMENT:    1. OSA  (obstructive sleep apnea)   2. Sleep-related hypoventilation   3. Essential hypertension   4. PVC's (premature ventricular contractions)   5. Thoracic aortic aneurysm without rupture [facility])    PLAN:  Mr. Ethen Bannan is a 49 year old male who has a history of hypertension on multiple drug regimen, a sending aortic aneurysm measuring 4.7 cm, frequent PVCs with burst of SVT noted on cardiac monitoring and previously had had atypical chest pain with a normal Lexiscan Myoview study.  He had symptoms highly suggestive of sleep apnea and his diagnostic polysomnogram confirmed moderate overall sleep apnea but this was severe during REM sleep and with supine position and was associated with significant oxygen desaturation to a nadir of 77%.  I had a long discussion with him and reviewed both his diagnostic polysomnogram as well as CPAP titration study.  I discussed with him at length the interference with normal sleep architecture if sleep apnea is present and its potential effects on hypertension control, nocturnal arrhythmias with atrial fibrillation and increased incidence of recurrent AF, increased nocturia as well as nocturnal hypoxemia contributing to potential coronary as well as cerebrovascular ischemia.  We discussed its implications with reference to glucose, GERD as well as inflammation.  He has now been on CPAP therapy and does note improvement from his previous symptomatology.  His usage is excellent at 100%.  I discussed with him that sleep duration although meeting compliance standards is still suboptimal.  I have recommended using CPAP therapy for the entirety of night with ideal sleep at 8 hours.  At 9 cm set pressure, AHI is 5.8.  I am changing his set pressure to an auto mode which will commence at 8 cm and can increase up to 16 cm of water if necessary.  His ResMed air fit F 20 medium size mask fits well and there is no leak.  His blood pressure today is controlled and somewhat on the low side on  his multiple drug regimen.  He is unaware of recent breakthrough tachycardic episodes on his increase carvedilol to 25 mg twice a day.  BMI is increased and is consistent with morbid obesity with a BMI of 43.9.  I discussed the importance of weight loss with reference to his CPAP.  I also discussed potential for increased exercise in discussed the AHA recommendations of at least 5 days/week for minimum of 30 minutes.  He is meeting CPAP compliance.  I will recheck a download with his auto mode.  As long as he remains stable, I will see him in 1 year for reevaluation or sooner if problems arise.  Time spent : 40 minutes Medication Adjustments/Labs and Tests Ordered: Current medicines are reviewed at length with the patient today.  Concerns regarding medicines are outlined above.  Medication changes, Labs and Tests ordered today are listed in the Patient Instructions below. Patient Instructions  Medication Instructions:  CONTINUE  WITH CURRENT MEDICATIONS. NO CHANGES.  *If you need a refill on your cardiac medications before your next appointment, please call your pharmacy*  Follow-Up: At [facility], you and your health needs are our priority.  As part of our continuing mission to provide you with exceptional heart care, we have created designated Provider Care Teams.  These Care Teams include your primary Cardiologist (physician) and Advanced Practice Providers (APPs -  Physician Assistants and Nurse Practitioners) who all work together to provide you with the care you need, when you need it.  We recommend signing up for the patient portal called "MyChart".  Sign up information is provided on this After Visit Summary.  MyChart is used to connect with patients for Virtual Visits (Telemedicine).  Patients are able to view lab/test results, encounter notes, upcoming appointments, etc.  Non-urgent messages can be sent to your provider as well.   To learn more about what you can do with MyChart, go to  [email or web address].    Your next appointment:   12 month(s)  The format for your next appointment:   In Person  Provider:   Shelva Majestic, MD      Signed, Shelva Majestic, MD  01/19/2020 4:13 PM    [facility] [address] [place], [place],   [address] Phone: [phone]

## 2020-01-20 ENCOUNTER — Encounter: Payer: Self-pay | Admitting: Pharmacist Clinician (PhC)/ Clinical Pharmacy Specialist

## 2020-01-20 MED ORDER — SILDENAFIL CITRATE 100 MG PO TABS: ORAL_TABLET | ORAL | 0 refills | Status: DC

## 2020-01-22 ENCOUNTER — Encounter: Payer: Self-pay | Admitting: Pharmacist Clinician (PhC)/ Clinical Pharmacy Specialist

## 2020-01-22 NOTE — Assessment & Plan Note (Signed)
Patient with essential hypertension, now doing well on multiple medications.  Patient is encouraged to continue with current regimen and home monitoring.   His goal is to increase exercise and adjust diet.   His hope is to be able to decrease the need for 5 medications to control his pressure.  Clarified with him that this is a slow and ongoing process.  It will probably take 9-12 months of steady weight loss and lifestyle changes for this to occur and he understands this.  Has developed issues with decreased libido/ED with medications.  Reviewed with Dr. Bjorn Pippin and will give patient prescription for sildenafil 100 mg to take 1/2-1 prn.  He was also concerned about some ongoing reflux, so it was recommended that he try some over the counter famotidine for this.  We will see him in 3 months for follow up and to help keep him on track with his goals.

## 2020-01-25 ENCOUNTER — Ambulatory Visit (HOSPITAL_COMMUNITY): Payer: Medicare Other

## 2020-01-29 NOTE — Progress Notes (Signed)
Cardiology Office Note:    Date:  01/31/2020   ID:  Ricky [name], DOB 1971-02-07, MRN [id number]  PCP:  Suella Broad, FNP  Cardiologist:  Donato Heinz, MD  Electrophysiologist:  None   Referring MD: Suella Broad   Chief Complaint  Patient presents with  . Hypertension    History of Present Illness:    Ricky [name] is a 49 y.o. male with a hx of frequent PVCs, thoracic aortic aneurysm, OSA hypertension, thrombocytopenia who presents for follow-up.  He was initially seen on 07/22/2019 after he was referred by Sheran Fava, FNP for an evaluation of PVCs.  Had an ED visit on 07/10/2019 with chest pain/body aches and lightheadedness.  EKG showed bigeminy. TTE was done on 07/28/2019 which showed low normal systolic function (EF 50 to 55%), septal hypokinesis.  Also was notable for ascending aortic aneurysm measuring up to 48 mm.  Underwent CTA chest on 08/16/2019, which showed ascending aortic aneurysm measuring up to 32mm.  Cardiac monitor showed frequent PVCs (23% of beats) and 120 episodes of SVT, lasting up to 1 minute.  Lexiscan Myoview on 08/18/2019 showed no evidence of ischemia.  Diagnosed with OSA.  He was referred to Dr. Lovena Le in EP for evaluation of his PVCs, recommended uptitrating his beta-blocker as initial treatment and repeat monitor once on Coreg 25 mg twice daily.  Repeat monitor in 11/11/2019 showed significant improvement in PVC burden (5.3% of beats).  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  Since last clinic visit, he reports that he has been doing well.  Checks his BP and reports it has been 110s to 120s over 60s to 70s.  He has had rare episodes of lightheadedness and checked his BP has been as low as 100 over 60s.  Denies any chest pain, dyspnea, palpitations, or syncope.  He has been walking twice a week for about 1 hour.  Reports compliance with his  CPAP.    Past Medical History:  Diagnosis Date  . 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 Hypertension   . Thrombocytopenia ([place]) 07/07/2012   138,000 08/26/11; 98,000 06/29/12    Past Surgical History:  Procedure Laterality Date  . ADENOIDECTOMY    . ANTERIOR FUSION CERVICAL SPINE    . APPENDECTOMY    . HERNIA REPAIR    . IRRIGATION AND DEBRIDEMENT SEBACEOUS CYST     on scalp  . KNEE ARTHROSCOPY     x2  . LUMBAR FUSION    . POSTERIOR FUSION CERVICAL SPINE    . TONSILLECTOMY      Current Medications: Current Meds  Medication Sig  . amLODipine (NORVASC) 5 MG tablet Take 2 tablets (10 mg total) by mouth daily.  . Azilsartan-Chlorthalidone 40-25 MG TABS Take 1 tablet by mouth daily.   . Blood Pressure Monitoring (BLOOD PRESSURE CUFF) MISC XL Adult blood pressure cuff  . carvedilol (COREG) 25 MG tablet Take 1 tablet (25 mg total) by mouth 2 (two) times daily.  . cyclobenzaprine (FLEXERIL) 10 MG tablet Take 1 tablet (10 mg total) by mouth 2 (two) times daily as needed for muscle spasms.  Marland Kitchen dicyclomine (BENTYL) 20 MG tablet Take 1 tablet (20 mg total) by mouth 2 (two) times daily between meals as needed for spasms.  Marland Kitchen omeprazole (PRILOSEC) 20 MG capsule Take 1 capsule (20 mg total) by mouth daily.  . potassium chloride (KLOR-CON) 10  MEQ tablet Take 10 mEq by mouth 2 (two) times daily.  . sildenafil (VIAGRA) 100 MG tablet Take 1/2 to 1 tablet as needed.  Marland Kitchen spironolactone (ALDACTONE) 25 MG tablet Take 1 tablet (25 mg total) by mouth daily.     Allergies:   Patient has no known allergies.   Social History   Socioeconomic History  . Marital status: Single    Spouse name: Not on file  . Number of children: Not on file  . Years of education: Not on file  . Highest education level: Not on file  Occupational History  . Not on file  Tobacco Use  . 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 Smokeless tobacco: Never Used  Substance and Sexual  Activity  . Alcohol use: No  . Drug use: No  . Sexual activity: Not on file  Other Topics Concern  . Not on file  Social History Narrative  . Not on file   Social Determinants of Health   Financial Resource Strain:   . Difficulty of Paying Living Expenses:   Food Insecurity:   . Worried About Programme researcher, broadcasting/film/video in the Last Year:   . Barista in the Last Year:   Transportation Needs:   . Freight forwarder (Medical):   Marland Kitchen Lack of Transportation (Non-Medical):   Physical Activity:   . Days of Exercise per Week:   . Minutes of Exercise per Session:   Stress:   . Feeling of Stress :   Social Connections:   . Frequency of Communication with Friends and Family:   . Frequency of Social Gatherings with Friends and Family:   . Attends Religious Services:   . Active Member of Clubs or Organizations:   . Attends Banker Meetings:   Marland Kitchen Marital Status:      Family History: No family history of heart disease  ROS:   Please see the history of present illness.    All other systems reviewed and are negative.  EKGs/Labs/Other Studies Reviewed:    The following studies were reviewed today:  EKG:  EKG is not ordered today.  The ekg ordered most recently demonstrates sinus rhythm, rate 64, no PVCs  Recent Labs: 07/10/2019: ALT 23 07/22/2019: Magnesium 2.1 11/23/2019: TSH 1.160 12/02/2019: Hemoglobin 14.4; Platelets 186 01/18/2020: BUN 20; Creatinine, Ser 0.87; Potassium 4.3; Sodium 139  Recent Lipid Panel No results found for: CHOL, TRIG, HDL, CHOLHDL, VLDL, LDLCALC, LDLDIRECT  Physical Exam:    VS:  BP 140/75   Pulse 74   Temp (!) 93.4 F (34.1 C)   Ht 6' (1.829 m)   Wt 289 lb 6.4 oz (131.3 kg)   SpO2 96%   BMI 39.25 kg/m   Repeat BP 106/76  Wt Readings from Last 3 Encounters:  01/31/20 289 lb 6.4 oz (131.3 kg)  01/19/20 286 lb 6.4 oz (129.9 kg)  01/13/20 286 lb (129.7 kg)     GEN:  Well nourished, well developed in no acute distress HEENT:  Normal NECK: No JVD LYMPHATICS: No lymphadenopathy CARDIAC: RRR, no murmurs, rubs, gallops RESPIRATORY:  Clear to auscultation without rales, wheezing or rhonchi  ABDOMEN: Soft, non-tender, non-distended MUSCULOSKELETAL:  No edema; No deformity  SKIN: Warm and dry NEUROLOGIC:  Alert and oriented x 3 PSYCHIATRIC:  Normal affect    Cardiac monitor 08/21/19:  Frequent ventricular ectopy (23% of beats)  120 episodes of SVT, longest lasting 59 seconds at 152 bpm  One 4 beat episode of NSVT  Patient  triggered events corresponded to sinus rhythm with ventricular ectopy, incluging bigeminy   4 days of data recorded on Zio monitor. Patient had a min HR of 46 bpm, max HR of 207 bpm, and avg HR of 77 bpm. Predominant underlying rhythm was Sinus Rhythm. No atrial fibrillation, high degree block, or pauses noted. There was one 4 beat run of NSVT and 120 runs of SVT, longest lasting 59 seconds at 152 bpm.  Isolated atrial was ectopy was rare (<1%).  Very frequent ventricular ectopy (23% of beats).  Episodes of bigeminy (longest lasting 132 seconds) and trigeminy (longest lasting 98 seconds).  There were 2 triggered events, corresponding to sinus rhythm with ventricular bigeminy.   No significant arrhythmias detected.  CTA chest 08/16/19: Aneurysmal dilatation of the ascending thoracic aorta up to 47 mm. Recommend semi-annual imaging followup by CTA or MRA and referral to cardiothoracic surgery if not already obtained. This recommendation follows 2010 ACCF/AHA/AATS/ACR/ASA/SCA/SCAI/SIR/STS/SVM Guidelines for the Diagnosis and Management of Patients With Thoracic Aortic Disease. Circulation. 2010; 121: H062-B762. Aortic aneurysm NOS (ICD10-I71.9)   Lexiscan Myoview 08/18/2019:  The study is normal.  This is a low risk study.   Normal pharmacologic nuclear stress test with no evidence for prior infarct or ischemia. LVEF not calculated. Frequent PVCs.  TTE 07/28/19:  1. Left ventricular  ejection fraction, by visual estimation, is 50 to 55%. The left ventricle has low normal function. There is mildly increased left ventricular hypertrophy.  2. Mildly dilated left ventricular internal cavity size.  3. The left ventricle demonstrates regional wall motion abnormalities. Septal hypokinesis  4. Global right ventricle has normal systolic function.The right ventricular size is normal. No increase in right ventricular wall thickness.  5. Left atrial size was normal.  6. Right atrial size was normal.  7. The mitral valve is normal in structure. No evidence of mitral valve regurgitation.  8. The tricuspid valve is normal in structure. Tricuspid valve regurgitation is not demonstrated.  9. The aortic valve is tricuspid. Aortic valve regurgitation is not visualized. No evidence of aortic valve sclerosis or stenosis. 10. The inferior vena cava is normal in size with greater than 50% respiratory variability, suggesting right atrial pressure of 3 mmHg. 11. Aneurysm of the ascending aorta, measuring 48 mm.  ASSESSMENT:    1. PVC's (premature ventricular contractions)   2. Hypertension, unspecified type   3. CAD in native artery   4. Diabetes mellitus screening   5. Thoracic aortic aneurysm without rupture (HCC)    PLAN:     PVCs:  frequent PVCs (23% of beats).  Symptomatic episodes on monitor appear to correspond to episodes of bigeminy.  Mild hypokalemia from chlorthalidone use may be contributing, started on potassium supplements.  TTE shows low normal systolic function (EF ~50%), septal hypokinesis.  No evidence of ischemia on Myoview.  Seen by EP, Dr Ladona Ridgel recommends titrating up coreg, with repeat monitor in 11/11/2019 showed significant improvement in PVC burden (5.3% of beats). -Significantly improved since increasing carvedilol dose, will continue carvedilol 25 mg twice daily  Aortic aneurysm: 4.7 cm ascending aortic aneurysm on CTA chest 12/18.  Repeat CTA chest 12/09/19 showed  stable aneurysm, measured 4.5 cm.  Follows with Dr. Vickey Sages in cardiothoracic surgery, repeat CT planned in 6 months.  Hypertension: On amlodipine 5 mg daily, carvedilol 25 mg twice daily, spironolactone 25 mg daily, and azilsartan-chlorthalidone 40-25 mg.  Given aortic aneurysm, recommend goal SBP less than 120.  Untreated OSA likely contributing, has started CPAP.  BP initially elevated  in clinic today (140/75), was 106/76 on repeat.  Reports BP well controlled at home.  Will continue current regimen.  Will check BMP  CAD: Reports atypical chest pain.  Given frequent PVCs and hypokinesis seen on TTE, Lexiscan Myoview on 08/18/19 showed no evidence of ischemia.  Coronary CTA on 12/08/2019 showed minimal nonobstructive CAD in mid LAD, calcium score 19 (84th percentile). -Will check lipid panel and plan to start statin.  Will also check A1c  OSA: Reports compliance with CPAP  RTC in 6 months  Medication Adjustments/Labs and Tests Ordered: Current medicines are reviewed at length with the patient today.  Concerns regarding medicines are outlined above.  Orders Placed This Encounter  Procedures  . Lipid panel  . Hemoglobin A1c  . Basic Metabolic Panel (BMET)   No orders of the defined types were placed in this encounter.   Patient Instructions  Medication Instructions:  Your physician recommends that you continue on your current medications as directed. Please refer to the Current Medication list given to you today. *If you need a refill on your cardiac medications before your next appointment, please call your pharmacy*   Lab Work: BMP, Lipid, HgA1C If you have labs (blood work) drawn today and your tests are completely normal, you will receive your results only by: Marland Kitchen MyChart Message (if you have MyChart) OR . A paper copy in the mail If you have any lab test that is abnormal or we need to change your treatment, we will call you to review the  results.   Testing/Procedures: None   Follow-Up: At [facility], you and your health needs are our priority.  As part of our continuing mission to provide you with exceptional heart care, we have created designated Provider Care Teams.  These Care Teams include your primary Cardiologist (physician) and Advanced Practice Providers (APPs -  Physician Assistants and Nurse Practitioners) who all work together to provide you with the care you need, when you need it.  We recommend signing up for the patient portal called "MyChart".  Sign up information is provided on this After Visit Summary.  MyChart is used to connect with patients for Virtual Visits (Telemedicine).  Patients are able to view lab/test results, encounter notes, upcoming appointments, etc.  Non-urgent messages can be sent to your provider as well.   To learn more about what you can do with MyChart, go to [email or web address].    Your next appointment:   6 month(s)  The format for your next appointment:   In Person  Provider:   Epifanio Lesches, MD   Other Instructions None    Signed, Little Ishikawa, MD  01/31/2020 8:45 AM    [facility]

## 2020-01-31 ENCOUNTER — Encounter: Payer: Self-pay | Admitting: Cardiology

## 2020-01-31 ENCOUNTER — Other Ambulatory Visit: Payer: Self-pay

## 2020-01-31 ENCOUNTER — Ambulatory Visit: Payer: Medicare Other | Admitting: Cardiology

## 2020-01-31 VITALS — BP 140/75 | HR 74 | Temp 93.4°F | Ht 72.0 in | Wt 289.4 lb

## 2020-01-31 DIAGNOSIS — I712 Thoracic aortic aneurysm, without rupture, unspecified: Secondary | ICD-10-CM

## 2020-01-31 DIAGNOSIS — I493 Ventricular premature depolarization: Secondary | ICD-10-CM

## 2020-01-31 DIAGNOSIS — I2510 Atherosclerotic heart disease of native coronary artery without angina pectoris: Secondary | ICD-10-CM

## 2020-01-31 DIAGNOSIS — Z131 Encounter for screening for diabetes mellitus: Secondary | ICD-10-CM | POA: Diagnosis not present

## 2020-01-31 DIAGNOSIS — I10 Essential (primary) hypertension: Secondary | ICD-10-CM | POA: Diagnosis not present

## 2020-01-31 NOTE — Patient Instructions (Signed)
Medication Instructions:  Your physician recommends that you continue on your current medications as directed. Please refer to the Current Medication list given to you today. *If you need a refill on your cardiac medications before your next appointment, please call your pharmacy*   Lab Work: BMP, Lipid, HgA1C If you have labs (blood work) drawn today and your tests are completely normal, you will receive your results only by: Marland Kitchen MyChart Message (if you have MyChart) OR . A paper copy in the mail If you have any lab test that is abnormal or we need to change your treatment, we will call you to review the results.   Testing/Procedures: None   Follow-Up: At [facility], you and your health needs are our priority.  As part of our continuing mission to provide you with exceptional heart care, we have created designated Provider Care Teams.  These Care Teams include your primary Cardiologist (physician) and Advanced Practice Providers (APPs -  Physician Assistants and Nurse Practitioners) who all work together to provide you with the care you need, when you need it.  We recommend signing up for the patient portal called "MyChart".  Sign up information is provided on this After Visit Summary.  MyChart is used to connect with patients for Virtual Visits (Telemedicine).  Patients are able to view lab/test results, encounter notes, upcoming appointments, etc.  Non-urgent messages can be sent to your provider as well.   To learn more about what you can do with MyChart, go to [email or web address].    Your next appointment:   6 month(s)  The format for your next appointment:   In Person  Provider:   Epifanio Lesches, MD   Other Instructions None

## 2020-02-01 ENCOUNTER — Other Ambulatory Visit: Payer: Self-pay | Admitting: *Deleted

## 2020-02-01 LAB — BASIC METABOLIC PANEL
BUN/Creatinine Ratio: 15 (ref 9–20)
BUN: 14 mg/dL (ref 6–24)
CO2: 26 mmol/L (ref 20–29)
Calcium: 8.9 mg/dL (ref 8.7–10.2)
Chloride: 97 mmol/L (ref 96–106)
Creatinine, Ser: 0.94 mg/dL (ref 0.76–1.27)
GFR calc Af Amer: 109 mL/min/{1.73_m2} (ref >59)
GFR calc non Af Amer: 94 mL/min/{1.73_m2} (ref >59)
Glucose: 118 mg/dL — ABNORMAL HIGH (ref 65–99)
Potassium: 4.4 mmol/L (ref 3.5–5.2)
Sodium: 136 mmol/L (ref 134–144)

## 2020-02-01 LAB — LIPID PANEL
Chol/HDL Ratio: 4.7 ratio (ref 0.0–5.0)
Cholesterol, Total: 169 mg/dL (ref 100–199)
HDL: 36 mg/dL — ABNORMAL LOW (ref >39)
LDL Chol Calc (NIH): 113 mg/dL — ABNORMAL HIGH (ref 0–99)
Triglycerides: 108 mg/dL (ref 0–149)
VLDL Cholesterol Cal: 20 mg/dL (ref 5–40)

## 2020-02-01 LAB — HEMOGLOBIN A1C
Est. average glucose Bld gHb Est-mCnc: 143 mg/dL
Hgb A1c MFr Bld: 6.6 % — ABNORMAL HIGH (ref 4.8–5.6)

## 2020-02-01 MED ORDER — ROSUVASTATIN CALCIUM 10 MG PO TABS: 10.0000 mg | ORAL_TABLET | Freq: Every day | ORAL | 3 refills | Status: DC

## 2020-03-13 ENCOUNTER — Other Ambulatory Visit: Payer: Self-pay | Admitting: Cardiology

## 2020-03-13 MED ORDER — AMLODIPINE BESYLATE 5 MG PO TABS: 10.0000 mg | ORAL_TABLET | Freq: Every day | ORAL | 3 refills | Status: DC

## 2020-03-13 NOTE — Telephone Encounter (Signed)
*  STAT* If patient is at the pharmacy, call can be transferred to refill team.   1. Which medications need to be refilled? (please list name of each medication and dose if known) amLODipine (NORVASC) 10 MG tablet  2. Which pharmacy/location (including street and city if local pharmacy) is medication to be sent to? [facility] [phone] - [place], [place] - [address]  3. Do they need a 30 day or 90 day supply? 90 day Patient is out of medication

## 2020-03-23 ENCOUNTER — Telehealth: Payer: Self-pay | Admitting: Cardiology

## 2020-03-23 NOTE — Telephone Encounter (Signed)
Pt c/o medication issue:  1. Name of Medication: carvedilol (COREG) 25 MG tablet  2. How are you currently taking this medication (dosage and times per day)? 1 tablet twice a day  3. Are you having a reaction (difficulty breathing--STAT)? no  4. What is your medication issue? Patient states lately his BP has been running good. He states it has been around 117/70 and now is 125/78. He states because of this he has not been taking his second tablet. He states when he does take it  His BP drops to 100/63. He would like to know if it is okay for him to only take it once a day.

## 2020-03-23 NOTE — Telephone Encounter (Signed)
Spoke to patient he stated he wanted to let Dr.Schumann know he decreased Carvedilol to 25 mg daily.He also decreased Spironolactone 25 mg 1/2 tablet every morning.He takes Amlodipine 5 mg daily. He has been taking this way for the past 1 week.Stated B/P last night 121/76.This morning 111/70.Pulse 56.Stated he is afraid to take B/P meds when B/P is 115/72. Stated last week B/P in afternoon 95/68. Adivised I will send message to Dr.Schumann for advice.

## 2020-03-25 NOTE — Telephone Encounter (Signed)
Would favor continuing coreg 25 mg twice daily, as in addition to lowering blood pressure is also suppressing his PVCs.  If BP running low, would recommend stopping amlodipine instead.  Continue other meds and check BP daily for next week and call with results

## 2020-03-28 NOTE — Telephone Encounter (Signed)
Spoke to patient, he states his blood pressures have been running 110s/70s with only taking amlodipine 5mg  at night.  He is using CPAP every night now.  He states he is sometimes hesitant to take both doses of carvedilol at night when his BP is 110s/70s.    Advised per Dr. , d/c amlodipine and take both doses of carvedilol as this helps with BP and PVCs.   Patient verbalized understanding and will continue to monitor BP and call in 1 week to update.

## 2020-03-31 ENCOUNTER — Telehealth: Payer: Self-pay | Admitting: Cardiology

## 2020-03-31 NOTE — Telephone Encounter (Signed)
Patient called stating that his BP has been running low in the am and has to take his BP meds in the afternoon because his CP is too low in the am.  He took his Carvedilol 25mg  and Axilsartan-cholorthalidone 40-25mg  yesterday at 10am .  Since then his BP has been 99-109/60's and has not taken his BP since then.  I have instructed him to hold on his BP meds tonight.  I have asked him to check his BP in the am and call the on call MD for further guidance on what to do.  I have also asked him to call Dr. on Monday.   Of note, he recently started on CPAP for OSA and since then his BP has been trending downward and HTN was likely driven by untreated OSA.

## 2020-04-01 ENCOUNTER — Telehealth: Payer: Self-pay | Admitting: Physician Assistant

## 2020-04-01 NOTE — Telephone Encounter (Signed)
49 year old male who called in last night with low blood pressures.  His amlodipine was stopped last week due to low blood pressure.  His pressure had gotten down into the 80s/50s after mowing the yard a few days ago.  His pressure has remained on the low side since then and he has not taken any of his blood pressure medication since Friday morning.  He spoke to Dr. Mayford Knife last night.  She told him to continue to hold his medications.  His pressure this morning is 120s over 80s.  He has held his morning medications. I advised him to take his carvedilol at 12.5 mg this evening if his blood pressure is >120/80.  I will have the nurse contact him tomorrow to follow-up to determine further adjustments in his blood pressure medications. [name], PA-C    04/01/2020 9:22 AM

## 2020-04-02 NOTE — Telephone Encounter (Signed)
Can we add-on for appointment tomorrow? 

## 2020-04-02 NOTE — Telephone Encounter (Signed)
Patient is calling to follow up

## 2020-04-02 NOTE — Telephone Encounter (Signed)
Scheduled to see Dr. Bjorn Pippin tomorrow at [facility].  Patient aware

## 2020-04-02 NOTE — Progress Notes (Signed)
Cardiology Office Note:    Date:  04/07/2020   ID:  Ricky [name], DOB 04-Feb-1971, MRN [id number]  PCP:  Ricky Amos, FNP  Cardiologist:  Ricky Ishikawa, MD  Electrophysiologist:  None   Referring MD: Ricky [name]   Chief Complaint  Patient presents with  . Hypertension    History of Present Illness:    Ricky [name] is a 49 y.o. male with a hx of frequent PVCs, thoracic aortic aneurysm, OSA hypertension, thrombocytopenia who presents for follow-up.  He was initially seen on 07/22/2019 after he was referred by Ricky Bee, FNP for an evaluation of PVCs.  Had an ED visit on 07/10/2019 with chest pain/body aches and lightheadedness.  EKG showed bigeminy. TTE was done on 07/28/2019 which showed low normal systolic function (EF 50 to 55%), septal hypokinesis.  Also was notable for ascending aortic aneurysm measuring up to 48 mm.  Underwent CTA chest on 08/16/2019, which showed ascending aortic aneurysm measuring up to 47mm.  Cardiac monitor showed frequent PVCs (23% of beats) and 120 episodes of SVT, lasting up to 1 minute.  Lexiscan Myoview on 08/18/2019 showed no evidence of ischemia.  Diagnosed with OSA.  He was referred to Dr. Ladona [name] in EP for evaluation of his PVCs, recommended uptitrating his beta-blocker as initial treatment and repeat monitor once on Coreg 25 mg twice daily.  Repeat monitor in 11/11/2019 showed significant improvement in PVC burden (5.3% of beats).  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  Since last clinic visit, he has been having issues with low blood pressure.  Reports BP was down to 85/52 last week, and felt dizzy during episode.  Amlodipine was discontinued but he continued to have low BP.  Today he took his azilsartan-chlorthalidone 40-25 mg, spironolactone 12.5 mg, and carvedilol 6.25 mg.  Also reports that he has been having chest pain daily over the last  week.  Reports sharp pain in the center of his chest and on the left side of his chest, that lasts for few seconds and resolves.  Reports compliance with his CPAP.  States that he has been watching what he eats, and has lost 21 pounds in the last 2 months   BP Readings from Last 3 Encounters:  04/05/20 118/84  04/03/20 (!) 92/56  01/31/20 140/75    Wt Readings from Last 3 Encounters:  04/05/20 290 lb (131.5 kg)  04/03/20 268 lb (121.6 kg)  01/31/20 289 lb 6.4 oz (131.3 kg)      Past Medical History:  Diagnosis Date  . 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 Hypertension   . Thrombocytopenia (HCC) 07/07/2012   138,000 08/26/11; 98,000 06/29/12    Past Surgical History:  Procedure Laterality Date  . ADENOIDECTOMY    . ANTERIOR FUSION CERVICAL SPINE    . APPENDECTOMY    . HERNIA REPAIR    . IRRIGATION AND DEBRIDEMENT SEBACEOUS CYST     on scalp  . KNEE ARTHROSCOPY     x2  . LUMBAR FUSION    . POSTERIOR FUSION CERVICAL SPINE    . TONSILLECTOMY      Current Medications: Current Meds  Medication Sig  . Blood Pressure Monitoring (BLOOD PRESSURE CUFF) MISC XL Adult blood pressure cuff  . cyclobenzaprine (FLEXERIL) 10 MG tablet Take 1 tablet (10 mg total) by mouth 2 (two) times daily as needed for muscle spasms.  Marland Kitchen  dicyclomine (BENTYL) 20 MG tablet Take 1 tablet (20 mg total) by mouth 2 (two) times daily between meals as needed for spasms.  Marland Kitchen omeprazole (PRILOSEC) 20 MG capsule Take 1 capsule (20 mg total) by mouth daily.  . rosuvastatin (CRESTOR) 10 MG tablet Take 1 tablet (10 mg total) by mouth daily.  . [DISCONTINUED] Azilsartan-Chlorthalidone 40-25 MG TABS Take 1 tablet by mouth daily.   . [DISCONTINUED] sildenafil (VIAGRA) 100 MG tablet Take 1/2 to 1 tablet as needed.  . [DISCONTINUED] spironolactone (ALDACTONE) 25 MG tablet Take 1 tablet (25 mg total) by mouth daily.     Allergies:   Patient has no known allergies.   Social  History   Socioeconomic History  . Marital status: Single    Spouse name: Not on file  . Number of children: Not on file  . Years of education: Not on file  . Highest education level: Not on file  Occupational History  . Occupation: Disabled  Tobacco Use  . Smoking status: Former Smoker    Packs/day: 0.50    Years: 6.00    Pack years: 3.00    Types: Cigarettes    Quit date: 2013    Years since quitting: 8.6  . Smokeless tobacco: Never Used  Substance and Sexual Activity  . Alcohol use: No  . Drug use: No  . Sexual activity: Not on file  Other Topics Concern  . Not on file  Social History Narrative  . Not on file   Social Determinants of Health   Financial Resource Strain:   . Difficulty of Paying Living Expenses: Not on file  Food Insecurity:   . Worried About Programme researcher, broadcasting/film/video in the Last Year: Not on file  . Ran Out of Food in the Last Year: Not on file  Transportation Needs:   . Lack of Transportation (Medical): Not on file  . Lack of Transportation (Non-Medical): Not on file  Physical Activity:   . Days of Exercise per Week: Not on file  . Minutes of Exercise per Session: Not on file  Stress:   . Feeling of Stress : Not on file  Social Connections:   . Frequency of Communication with Friends and Family: Not on file  . Frequency of Social Gatherings with Friends and Family: Not on file  . Attends Religious Services: Not on file  . Active Member of Clubs or Organizations: Not on file  . Attends Banker Meetings: Not on file  . Marital Status: Not on file     Family History: No family history of heart disease  ROS:   Please see the history of present illness.    All other systems reviewed and are negative.  EKGs/Labs/Other Studies Reviewed:    The following studies were reviewed today:  EKG:  EKG is ordered today.  The ekg ordered most recently demonstrates sinus rhythm, rate 76, PVCs  Recent Labs: 04/03/2020: Magnesium 1.8 04/05/2020:  ALT 17; BUN 15; Creatinine, Ser 0.76; Hemoglobin 13.4; Platelets 177; Potassium 4.3; Sodium 141; TSH 1.010  Recent Lipid Panel    Component Value Date/Time   CHOL 169 01/31/2020 0850   TRIG 108 01/31/2020 0850   HDL 36 (L) 01/31/2020 0850   CHOLHDL 4.7 01/31/2020 0850   LDLCALC 113 (H) 01/31/2020 0850    Physical Exam:    VS:  BP (!) 92/56   Pulse 76   Ht 6' (1.829 m)   Wt 268 lb (121.6 kg)   SpO2 97%  BMI 36.35 kg/m   Repeat BP 106/76  Wt Readings from Last 3 Encounters:  04/05/20 290 lb (131.5 kg)  04/03/20 268 lb (121.6 kg)  01/31/20 289 lb 6.4 oz (131.3 kg)     GEN:  Well nourished, well developed in no acute distress HEENT: Normal NECK: No JVD LYMPHATICS: No lymphadenopathy CARDIAC: RRR, no murmurs, rubs, gallops RESPIRATORY:  Clear to auscultation without rales, wheezing or rhonchi  ABDOMEN: Soft, non-tender, non-distended MUSCULOSKELETAL:  No edema; No deformity  SKIN: Warm and dry NEUROLOGIC:  Alert and oriented x 3 PSYCHIATRIC:  Normal affect    Cardiac monitor 08/21/19:  Frequent ventricular ectopy (23% of beats)  120 episodes of SVT, longest lasting 59 seconds at 152 bpm  One 4 beat episode of NSVT  Patient triggered events corresponded to sinus rhythm with ventricular ectopy, incluging bigeminy   4 days of data recorded on Zio monitor. Patient had a min HR of 46 bpm, max HR of 207 bpm, and avg HR of 77 bpm. Predominant underlying rhythm was Sinus Rhythm. No atrial fibrillation, high degree block, or pauses noted. There was one 4 beat run of NSVT and 120 runs of SVT, longest lasting 59 seconds at 152 bpm.  Isolated atrial was ectopy was rare (<1%).  Very frequent ventricular ectopy (23% of beats).  Episodes of bigeminy (longest lasting 132 seconds) and trigeminy (longest lasting 98 seconds).  There were 2 triggered events, corresponding to sinus rhythm with ventricular bigeminy.   No significant arrhythmias detected.  CTA chest 08/16/19: Aneurysmal  dilatation of the ascending thoracic aorta up to 47 mm. Recommend semi-annual imaging followup by CTA or MRA and referral to cardiothoracic surgery if not already obtained. This recommendation follows 2010 ACCF/AHA/AATS/ACR/ASA/SCA/SCAI/SIR/STS/SVM Guidelines for the Diagnosis and Management of Patients With Thoracic Aortic Disease. Circulation. 2010; 121: P824-M353. Aortic aneurysm NOS (ICD10-I71.9)   Lexiscan Myoview 08/18/2019:  The study is normal.  This is a low risk study.   Normal pharmacologic nuclear stress test with no evidence for prior infarct or ischemia. LVEF not calculated. Frequent PVCs.  TTE 07/28/19:  1. Left ventricular ejection fraction, by visual estimation, is 50 to 55%. The left ventricle has low normal function. There is mildly increased left ventricular hypertrophy.  2. Mildly dilated left ventricular internal cavity size.  3. The left ventricle demonstrates regional wall motion abnormalities. Septal hypokinesis  4. Global right ventricle has normal systolic function.The right ventricular size is normal. No increase in right ventricular wall thickness.  5. Left atrial size was normal.  6. Right atrial size was normal.  7. The mitral valve is normal in structure. No evidence of mitral valve regurgitation.  8. The tricuspid valve is normal in structure. Tricuspid valve regurgitation is not demonstrated.  9. The aortic valve is tricuspid. Aortic valve regurgitation is not visualized. No evidence of aortic valve sclerosis or stenosis. 10. The inferior vena cava is normal in size with greater than 50% respiratory variability, suggesting right atrial pressure of 3 mmHg. 11. Aneurysm of the ascending aorta, measuring 48 mm.  ASSESSMENT:    1. Essential hypertension   2. PVC's (premature ventricular contractions)   3. CAD in native artery   4. Morbid obesity (HCC)   5. Thoracic aortic aneurysm without rupture (HCC)   6. OSA (obstructive sleep apnea)   7.  Hyperlipidemia, unspecified hyperlipidemia type    PLAN:     Hypertension: On amlodipine 5 mg daily, carvedilol 25 mg twice daily, spironolactone 25 mg daily, and azilsartan-chlorthalidone 40-25 mg.  Given aortic aneurysm, recommend goal SBP less than 120.  Has improved significantly since starting CPAP and with weight loss -Amlodipine discontinued last week due to low BP -Will discontinue spironolactone -Discontinue his azilsartan-chlorthalidone and start losartan 25 mg daily. -Continue carvedilol 6.25 mg twice daily for now.  Ideally would like to increase coreg to higher dose for PVC suppression.   -Will check BMP, magnesium.  -Asked patient to monitor BP twice daily for next week and call with results.  May have to back off regimen further if continue to have low BP.  Will schedule pharmacy clinic for 2 weeks  PVCs:  frequent PVCs (23% of beats).  Symptomatic episodes on monitor appear to correspond to episodes of bigeminy.   TTE shows low normal systolic function (EF ~50%), septal hypokinesis.  No evidence of ischemia on Myoview.  Seen by EP, Dr Ricky [name] recommended titrating up coreg, with repeat monitor in 11/11/2019 showed significant improvement in PVC burden (5.3% of beats). -Significantly improved with carvedilol, will continue carvedilol   Aortic aneurysm: 4.7 cm ascending aortic aneurysm on CTA chest 12/18.  Repeat CTA chest 12/09/19 showed stable aneurysm, measured 4.5 cm.  Follows with Dr. Vickey Sages in cardiothoracic surgery, repeat CT planned in 6 months.  CAD: Reports atypical chest pain.  Given frequent PVCs and hypokinesis seen on TTE, Lexiscan Myoview on 08/18/19 showed no evidence of ischemia.  Coronary CTA on 12/08/2019 showed minimal nonobstructive CAD in mid LAD, calcium score 19 (84th percentile). -Started rosuvastatin 10 mg daily on 01/31/2020  Hyperlipidemia: LDL 113 on 01/31/2020.  Started rosuvastatin 10 mg daily  OSA: Reports compliance with CPAP  RTC in 2  months  Medication Adjustments/Labs and Tests Ordered: Current medicines are reviewed at length with the patient today.  Concerns regarding medicines are outlined above.  Orders Placed This Encounter  Procedures  . Basic metabolic panel  . Magnesium  . Ambulatory referral to [facility]  . EKG 12-Lead   Meds ordered this encounter  Medications  . carvedilol (COREG) 6.25 MG tablet    Sig: Take 1 tablet (6.25 mg total) by mouth 2 (two) times daily.    Dispense:  60 tablet    Refill:  3    Dose change    Patient Instructions  Medication Instructions:  STOP spironolactone DECREASE carvedilol (Coreg) to 6.25 mg two times daily  *If you need a refill on your cardiac medications before your next appointment, please call your pharmacy*   Lab Work: BMET, Mag today  If you have labs (blood work) drawn today and your tests are completely normal, you will receive your results only by: Marland Kitchen MyChart Message (if you have MyChart) OR . A paper copy in the mail If you have any lab test that is abnormal or we need to change your treatment, we will call you to review the results.   Follow-Up: At [facility], you and your health needs are our priority.  As part of our continuing mission to provide you with exceptional heart care, we have created designated Provider Care Teams.  These Care Teams include your primary Cardiologist (physician) and Advanced Practice Providers (APPs -  Physician Assistants and Nurse Practitioners) who all work together to provide you with the care you need, when you need it.  We recommend signing up for the patient portal called "MyChart".  Sign up information is provided on this After Visit Summary.  MyChart is used to connect with patients for Virtual Visits (Telemedicine).  Patients are able to view  lab/test results, encounter notes, upcoming appointments, etc.  Non-urgent messages can be sent to your provider as well.   To learn more about what you can do with  MyChart, go to [email or web address].    Your next appointment:   9/7 with pharmacist as scheduled 2 months with Dr. Bjorn PippinSchumann  Other Instructions Please check your blood pressure 2 times daily at home, write it down.  Call the office or send message via Mychart with the readings in 1 week for Dr. Bjorn PippinSchumann to review.   Call the office is the top number (systolic) is less than 100    Signed, Ricky Ishikawahristopher L Damiano Stamper, MD  04/07/2020 4:02 PM    [facility]

## 2020-04-02 NOTE — Telephone Encounter (Signed)
Pt called to report that he spoke with the On Call MD over the weekend after calling our office last week and was told top stop his Amlodipine.. he has been having low BP readings for the past week.   This past Friday in the morning his BP was 117/78 and he took his meds... later in the day he was out mowing the lawn and felt light headed so he checked his BP and it was 85/52... he ate some salty chips and drank 2 water bottles and he felt better his BP was then 109/68 and stayed that way the rest of the day... that night his BP was 99/66 but he felt well.   Saturday morning his BP was 115/70 he felt well but did not take his meds. It stayed about the same all day.   Sunday it was 123/75 when he woke up but he was having anxiety about the fluctuations so he called the MD on call and spoke with [name] PA.. he was given parameters with his meds and then asked to call us this morning with an update.   Pt says he is anxious... he is worried that the low BP readings he recently had were a sign of a blockage.. he says he has some occasional sharp pain in his chest not related to exertion. Pt denies dyspnea.   He had NL stress test 07/2019.   I have asked him to continue to monitor and to be sure that he is hydrating very well especially when outside in the heat.   His BP this morning was 133/66. He took a Coreg 6.25 mg that he found in his cabinet instead of his normal dose.   Pt wants to be seen soon but there in so available APP appt until 04/19/20.   Will forward this to Dr. Bjorn Pippin for review.

## 2020-04-03 ENCOUNTER — Other Ambulatory Visit: Payer: Self-pay

## 2020-04-03 ENCOUNTER — Encounter: Payer: Self-pay | Admitting: Cardiology

## 2020-04-03 ENCOUNTER — Ambulatory Visit: Payer: Medicare Other | Admitting: Cardiology

## 2020-04-03 VITALS — BP 92/56 | HR 76 | Ht 72.0 in | Wt 268.0 lb

## 2020-04-03 DIAGNOSIS — I712 Thoracic aortic aneurysm, without rupture, unspecified: Secondary | ICD-10-CM

## 2020-04-03 DIAGNOSIS — I10 Essential (primary) hypertension: Secondary | ICD-10-CM | POA: Diagnosis not present

## 2020-04-03 DIAGNOSIS — G4733 Obstructive sleep apnea (adult) (pediatric): Secondary | ICD-10-CM

## 2020-04-03 DIAGNOSIS — I493 Ventricular premature depolarization: Secondary | ICD-10-CM | POA: Diagnosis not present

## 2020-04-03 DIAGNOSIS — I2510 Atherosclerotic heart disease of native coronary artery without angina pectoris: Secondary | ICD-10-CM

## 2020-04-03 DIAGNOSIS — E785 Hyperlipidemia, unspecified: Secondary | ICD-10-CM

## 2020-04-03 LAB — BASIC METABOLIC PANEL
BUN/Creatinine Ratio: 18 (ref 9–20)
BUN: 14 mg/dL (ref 6–24)
CO2: 25 mmol/L (ref 20–29)
Calcium: 9.3 mg/dL (ref 8.7–10.2)
Chloride: 100 mmol/L (ref 96–106)
Creatinine, Ser: 0.78 mg/dL (ref 0.76–1.27)
GFR calc Af Amer: 122 mL/min/{1.73_m2} (ref >59)
GFR calc non Af Amer: 105 mL/min/{1.73_m2} (ref >59)
Glucose: 134 mg/dL — ABNORMAL HIGH (ref 65–99)
Potassium: 3.8 mmol/L (ref 3.5–5.2)
Sodium: 137 mmol/L (ref 134–144)

## 2020-04-03 LAB — MAGNESIUM: Magnesium: 1.8 mg/dL (ref 1.6–2.3)

## 2020-04-03 MED ORDER — CARVEDILOL 6.25 MG PO TABS: 6.2500 mg | ORAL_TABLET | Freq: Two times a day (BID) | ORAL | 3 refills | Status: DC

## 2020-04-03 NOTE — Patient Instructions (Addendum)
Medication Instructions:  STOP spironolactone DECREASE carvedilol (Coreg) to 6.25 mg two times daily  *If you need a refill on your cardiac medications before your next appointment, please call your pharmacy*   Lab Work: BMET, Mag today  If you have labs (blood work) drawn today and your tests are completely normal, you will receive your results only by: Marland Kitchen MyChart Message (if you have MyChart) OR . A paper copy in the mail If you have any lab test that is abnormal or we need to change your treatment, we will call you to review the results.   Follow-Up: At [facility], you and your health needs are our priority.  As part of our continuing mission to provide you with exceptional heart care, we have created designated Provider Care Teams.  These Care Teams include your primary Cardiologist (physician) and Advanced Practice Providers (APPs -  Physician Assistants and Nurse Practitioners) who all work together to provide you with the care you need, when you need it.  We recommend signing up for the patient portal called "MyChart".  Sign up information is provided on this After Visit Summary.  MyChart is used to connect with patients for Virtual Visits (Telemedicine).  Patients are able to view lab/test results, encounter notes, upcoming appointments, etc.  Non-urgent messages can be sent to your provider as well.   To learn more about what you can do with MyChart, go to [email or web address].    Your next appointment:   9/7 with pharmacist as scheduled 2 months with Dr. Bjorn Pippin  Other Instructions Please check your blood pressure 2 times daily at home, write it down.  Call the office or send message via Mychart with the readings in 1 week for Dr. Bjorn Pippin to review.   Call the office is the top number (systolic) is less than 100

## 2020-04-04 ENCOUNTER — Other Ambulatory Visit: Payer: Self-pay | Admitting: *Deleted

## 2020-04-04 ENCOUNTER — Other Ambulatory Visit: Payer: Self-pay | Admitting: Cardiology

## 2020-04-04 MED ORDER — LOSARTAN POTASSIUM 25 MG PO TABS: 25.0000 mg | ORAL_TABLET | Freq: Every day | ORAL | 3 refills | Status: DC

## 2020-04-04 NOTE — Telephone Encounter (Signed)
This is Dr. Schumann's pt 

## 2020-04-05 ENCOUNTER — Other Ambulatory Visit: Payer: Self-pay

## 2020-04-05 ENCOUNTER — Ambulatory Visit (INDEPENDENT_AMBULATORY_CARE_PROVIDER_SITE_OTHER): Payer: Medicare Other | Admitting: Family Medicine

## 2020-04-05 ENCOUNTER — Encounter (INDEPENDENT_AMBULATORY_CARE_PROVIDER_SITE_OTHER): Payer: Self-pay | Admitting: Family Medicine

## 2020-04-05 VITALS — BP 118/84 | HR 61 | Temp 98.1°F | Ht 72.0 in | Wt 290.0 lb

## 2020-04-05 DIAGNOSIS — I10 Essential (primary) hypertension: Secondary | ICD-10-CM

## 2020-04-05 DIAGNOSIS — Z1331 Encounter for screening for depression: Secondary | ICD-10-CM | POA: Diagnosis not present

## 2020-04-05 DIAGNOSIS — Z6839 Body mass index (BMI) 39.0-39.9, adult: Secondary | ICD-10-CM | POA: Diagnosis not present

## 2020-04-05 DIAGNOSIS — E1169 Type 2 diabetes mellitus with other specified complication: Secondary | ICD-10-CM | POA: Diagnosis not present

## 2020-04-05 DIAGNOSIS — R5383 Other fatigue: Secondary | ICD-10-CM

## 2020-04-05 DIAGNOSIS — R0602 Shortness of breath: Secondary | ICD-10-CM

## 2020-04-05 DIAGNOSIS — E785 Hyperlipidemia, unspecified: Secondary | ICD-10-CM

## 2020-04-05 DIAGNOSIS — E1159 Type 2 diabetes mellitus with other circulatory complications: Secondary | ICD-10-CM

## 2020-04-05 DIAGNOSIS — Z0289 Encounter for other administrative examinations: Secondary | ICD-10-CM

## 2020-04-05 DIAGNOSIS — G4733 Obstructive sleep apnea (adult) (pediatric): Secondary | ICD-10-CM

## 2020-04-05 NOTE — Progress Notes (Signed)
Dear Dr. Bjorn Ricky [name],   Thank you for referring Ricky Ricky [name] to our clinic. The following note includes my evaluation and treatment recommendations.  Chief Complaint:   OBESITY Ricky Ricky [name] (MR# [id number]) is a 49 y.o. male who presents for evaluation and treatment of obesity and related comorbidities. Current BMI is Body mass index is 39.33 kg/m. Ricky Ricky [name] has been struggling with his weight for many years and has been unsuccessful in either losing weight, maintaining weight loss, or reaching his healthy weight goal.  Ricky Ricky [name] is currently in the action stage of change and ready to dedicate time achieving and maintaining a healthier weight. Ricky Ricky [name] is interested in becoming our patient and working Ricky [name] intensive lifestyle modifications including (but not limited to) diet and exercise for weight loss.  Ricky Ricky [name] is Ricky [name] Disability.  He lives with his 73 year old son, who has type 1 diabetes mellitus.  He was referred to [place] by his cardiologist.   Ricky Ricky [name] habits were reviewed today and are as follows: His family eats meals together, he thinks his family will eat healthier with him, his desired weight loss is 70-75 pounds, he has been heavy most of his life, he started gaining weight in 1997, his heaviest weight ever was 290 pounds, he is frequently drinking liquids with calories, he frequently eats larger portions than normal and he struggles with emotional eating.  Depression Screen Ricky Ricky [name]'s Food and Mood (modified PHQ-9) score was 5.  Depression screen PHQ 2/9 04/05/2020  Decreased Interest 1  Down, Depressed, Hopeless 1  PHQ - 2 Score 2  Altered sleeping 1  Tired, decreased energy 2  Change in appetite 0  Feeling bad or failure about yourself  0  Trouble concentrating 0  Moving slowly or fidgety/restless 0  Suicidal thoughts 0  PHQ-9 Score 5  Difficult doing work/chores Not difficult at all   Subjective:   1. SOB (shortness of breath) Ricky [name] exertion Ricky Ricky [name] notes increasing shortness of  breath with exercising and seems to be worsening over time with weight gain. He notes getting out of breath sooner with activity than he used to. This has gotten worse recently. Ricky Ricky [name] denies shortness of breath at rest or orthopnea.  2. Other fatigue Ricky [name] admits to daytime somnolence and denies waking up still tired. Patent has a history of symptoms of daytime fatigue and snoring. Ricky [name] generally gets 7 or 8 hours of sleep per night, and states that he has generally restful sleep. Snoring is present. Apneic episodes are not present. Epworth Sleepiness Score is 8.  3. Type 2 diabetes mellitus with other specified complication, without long-term current use of insulin (HCC) Medications reviewed. Diabetic ROS: no polyuria or polydipsia, no chest pain, dyspnea or TIA's, no numbness, tingling or pain in extremities.  Ricky [name] is newly diagnosed, but has not discussed it with his PCP yet.  A1c Ricky [name] 01/31/2020 was 6.6.  He was never told he was not told he had diabetes as his labs were obtained by his cardiologist and he was told to follow-up with his PCP and has not done it yet.  Lab Results  Component Value Date   HGBA1C 6.6 (H) 01/31/2020   Lab Results  Component Value Date   LDLCALC 113 (H) 01/31/2020   CREATININE 0.78 04/03/2020   4. Hypertension associated with type 2 diabetes mellitus (HCC) Review: taking medications as instructed, no medication side effects noted, no chest pain Ricky [name] exertion, no dyspnea Ricky [name] exertion, no swelling of ankles.  With aortic aneurysm.  Poorly controlled.  Hypertension management by Cardiology.  Controlled now for about 3 months or so.  Medication management by Cardiology with goal SBP less than 120.  Recently changed medications by Cardiology 2 days ago.  BP Readings from Last 3 Encounters:  04/05/20 118/84  04/03/20 (!) 92/56  01/31/20 140/75   5. Hyperlipidemia associated with type 2 diabetes mellitus (HCC) Ricky Ricky [name] has hyperlipidemia and has been trying to improve his  cholesterol levels with intensive lifestyle modification including a low saturated fat diet, exercise and weight loss. He denies any chest pain, claudication or myalgias.  He is taking Crestor per Cardiology.  Lab Results  Component Value Date   ALT 23 07/10/2019   AST 37 07/10/2019   ALKPHOS 68 07/10/2019   BILITOT 0.8 07/10/2019   Lab Results  Component Value Date   CHOL 169 01/31/2020   HDL 36 (L) 01/31/2020   LDLCALC 113 (H) 01/31/2020   TRIG 108 01/31/2020   CHOLHDL 4.7 01/31/2020   6. OSA (obstructive sleep apnea) Ricky Ricky [name] has a diagnosis of sleep apnea. He reports that he is using a CPAP regularly.  He sees Dr. Tresa Endo for this.  No issues.  7. Depression screen Ricky Ricky [name] was screened for depression as part of his new patient workup.  Assessment/Plan:   1. SOB (shortness of breath) Ricky [name] exertion Ricky Ricky [name] does feel that he gets out of breath more easily that he used to when he exercises. Ricky [name]'s shortness of breath appears to be obesity related and exercise induced. He has agreed to work Ricky [name] weight loss and gradually increase exercise to treat his exercise induced shortness of breath. Will continue to monitor closely.   - CBC with Differential/Platelet  2. Other fatigue Ricky Ricky [name] does feel that his weight is causing his energy to be lower than it should be. Fatigue may be related to obesity, depression or many other causes. Labs will be ordered, and in the meanwhile, Ricky [name] will focus Ricky [name] self care including making healthy food choices, increasing physical activity and focusing Ricky [name] stress reduction. - Vitamin B12 - CBC with Differential/Platelet - Folate - T3 - T4 - TSH - VITAMIN D 25 Hydroxy (Vit-D Deficiency, Fractures)  3. Type 2 diabetes mellitus with other specified complication, without long-term current use of insulin (HCC) Ricky Ricky [name] blood sugar control is important to decrease the likelihood of diabetic complications such as nephropathy, neuropathy, limb loss, blindness, coronary artery  disease, and death. Intensive lifestyle modification including diet, exercise and weight loss are the first line of treatment for diabetes.  Long discussion today regarding diabetes, treatment, prudent nutritional plan, weight loss, etc.  Will check labs. - CBC with Differential/Platelet - Comprehensive metabolic panel - Hemoglobin A1c - Insulin, random  4. Hypertension associated with type 2 diabetes mellitus (HCC) Ricky Ricky [name] is working Ricky [name] healthy weight loss and exercise to improve blood pressure control. We will watch for signs of hypotension as he continues his lifestyle modifications.  Blood pressure at goal today.  Medication management per Cardiology, prudent nutritional plan, weight loss.  Check labs today. - Comprehensive metabolic panel  5. Hyperlipidemia associated with type 2 diabetes mellitus (HCC) Cardiovascular risk and specific lipid/LDL goals reviewed.  We discussed several lifestyle modifications today and Vayden will continue to work Ricky [name] diet, exercise and weight loss efforts. Orders and follow up as documented in patient record.  Medication management per Cardiology, prudent nutritional plan, weight loss.  Check labs.  CMP and FLP recently done, so no need for repeat.  Counseling Intensive lifestyle modifications are the first line treatment  for this issue.  Dietary changes: Increase soluble fiber. Decrease simple carbohydrates.  Exercise changes: Moderate to vigorous-intensity aerobic activity 150 minutes per week if tolerated.  Lipid-lowering medications: see documented in medical record.  6. OSA (obstructive sleep apnea) Intensive lifestyle modifications are the first line treatment for this issue. We discussed several lifestyle modifications today and he will continue to work Ricky [name] diet, exercise and weight loss efforts. We will continue to monitor. Orders and follow up as documented in patient record.  Management per Cardiology.  Continue to use nightly.  Importance discussed  with him and how OSA relates to obesity.   Counseling  Sleep apnea is a condition in which breathing pauses or becomes shallow during sleep. This happens over and over during the night. This disrupts your sleep and keeps your body from getting the rest that it needs, which can cause tiredness and lack of energy (fatigue) during the day.  Sleep apnea treatment: If you were given a device to open your airway while you sleep, USE IT!  Sleep hygiene:   Limit or avoid alcohol, caffeinated beverages, and cigarettes, especially close to bedtime.   Do not eat a large meal or eat spicy foods right before bedtime. This can lead to digestive discomfort that can make it hard for you to sleep.  Keep a sleep diary to help you and your health care provider figure out what could be causing your insomnia.   Make your bedroom a dark, comfortable place where it is easy to fall asleep. ? Put up shades or blackout curtains to block light from outside. ? Use a white noise machine to block noise. ? Keep the temperature cool.  Limit screen use before bedtime. This includes: ? Watching TV. ? Using your smartphone, tablet, or computer.  Stick to a routine that includes going to bed and waking up at the same times every day and night. This can help you fall asleep faster. Consider making a quiet activity, such as reading, part of your nighttime routine.  Try to avoid taking naps during the day so that you sleep better at night.  Get out of bed if you are still awake after 15 minutes of trying to sleep. Keep the lights down, but try reading or doing a quiet activity. When you feel sleepy, go back to bed.  7. Depression screen Depression screen is negative today.  PHQ-9 is 5.  8. Class 2 severe obesity with serious comorbidity and body mass index (BMI) of 39.0 to 39.9 in adult, unspecified obesity type Ricky Ricky [name]) Amare is currently in the action stage of change and his goal is to continue with weight loss efforts.  I recommend Ricky Ricky [name] begin the structured treatment plan as follows:  He has agreed to the Category 4 Plan with lunch options as he loves salads.  Exercise goals: As is.   Behavioral modification strategies: increasing lean protein intake, decreasing liquid calories, no skipping meals, meal planning and cooking strategies and planning for success.  He was informed of the importance of frequent follow-up visits to maximize his success with intensive lifestyle modifications for his multiple health conditions. He was informed we would discuss his lab results at his next visit unless there is a critical issue that needs to be addressed sooner. Domani agreed to keep his next visit at the agreed upon time to discuss these results.  Objective:   Blood pressure 118/84, pulse 61, temperature 98.1 F (36.7 C), height 6' (1.829 m), weight 290 lb (131.5 kg), SpO2  96 %. Body mass index is 39.33 kg/m.  Indirect Calorimeter completed today shows a VO2 of 382 and a REE of 2660.  His calculated basal metabolic rate is [id number] thus his basal metabolic rate is better than expected.  General: Cooperative, alert, well developed, in no acute distress. HEENT: Conjunctivae and lids unremarkable. Cardiovascular: Regular rhythm.  Lungs: Normal work of breathing. Neurologic: No focal deficits.   Lab Results  Component Value Date   CREATININE 0.78 04/03/2020   BUN 14 04/03/2020   NA 137 04/03/2020   K 3.8 04/03/2020   CL 100 04/03/2020   CO2 25 04/03/2020   Lab Results  Component Value Date   ALT 23 07/10/2019   AST 37 07/10/2019   ALKPHOS 68 07/10/2019   BILITOT 0.8 07/10/2019   Lab Results  Component Value Date   HGBA1C 6.6 (H) 01/31/2020   Lab Results  Component Value Date   TSH 1.160 11/23/2019   Lab Results  Component Value Date   CHOL 169 01/31/2020   HDL 36 (L) 01/31/2020   LDLCALC 113 (H) 01/31/2020   TRIG 108 01/31/2020   CHOLHDL 4.7 01/31/2020   Lab Results  Component Value Date    WBC 7.8 12/02/2019   HGB 14.4 12/02/2019   HCT 45.0 12/02/2019   MCV 89.8 12/02/2019   PLT 186 12/02/2019   Obesity Behavioral Intervention Visit Documentation for Insurance:   Approximately 15 minutes were spent Ricky [name] the discussion below.  ASK: We discussed the diagnosis of obesity with Ricky Ricky [name] today and Ricky Ricky [name] agreed to give us permission to discuss obesity behavioral modification therapy today.  ASSESS: Ricky Ricky [name] has the diagnosis of obesity and his BMI today is 39.4. Ricky Ricky [name] is in the action stage of change.   ADVISE: Ricky Ricky [name] was educated Ricky [name] the multiple health risks of obesity as well as the benefit of weight loss to improve his health. He was advised of the need for long term treatment and the importance of lifestyle modifications to improve his current health and to decrease his risk of future health problems.  AGREE: Multiple dietary modification options and treatment options were discussed and Ricky Ricky [name] agreed to follow the recommendations documented in the above note.  ARRANGE: Ricky Ricky [name] was educated Ricky [name] the importance of frequent visits to treat obesity as outlined per CMS and USPSTF guidelines and agreed to schedule his next follow up appointment today.  Attestation Statements:   Reviewed by clinician Ricky [name] day of visit: allergies, medications, problem list, medical history, surgical history, family history, social history, and previous encounter notes.  I, Insurance claims handlerAmber Agner, CMA, am acting as Energy managertranscriptionist for Marsh & McLennanDeborah Roshawn Ayala, DO.  I have reviewed the above documentation for accuracy and completeness, and I agree with the above. Thomasene Lot-  Harleen Fineberg, DO

## 2020-04-06 LAB — CBC WITH DIFFERENTIAL/PLATELET
Basophils Absolute: 0 10*3/uL (ref 0.0–0.2)
Basos: 0 %
EOS (ABSOLUTE): 0.1 10*3/uL (ref 0.0–0.4)
Eos: 1 %
Hematocrit: 40.4 % (ref 37.5–51.0)
Hemoglobin: 13.4 g/dL (ref 13.0–17.7)
Immature Grans (Abs): 0 10*3/uL (ref 0.0–0.1)
Immature Granulocytes: 0 %
Lymphocytes Absolute: 2.6 10*3/uL (ref 0.7–3.1)
Lymphs: 37 %
MCH: 28.9 pg (ref 26.6–33.0)
MCHC: 33.2 g/dL (ref 31.5–35.7)
MCV: 87 fL (ref 79–97)
Monocytes Absolute: 0.6 10*3/uL (ref 0.1–0.9)
Monocytes: 9 %
Neutrophils Absolute: 3.5 10*3/uL (ref 1.4–7.0)
Neutrophils: 53 %
Platelets: 177 10*3/uL (ref 150–450)
RBC: 4.63 x10E6/uL (ref 4.14–5.80)
RDW: 13.3 % (ref 11.6–15.4)
WBC: 6.9 10*3/uL (ref 3.4–10.8)

## 2020-04-06 LAB — COMPREHENSIVE METABOLIC PANEL
ALT: 17 IU/L (ref 0–44)
AST: 27 IU/L (ref 0–40)
Albumin/Globulin Ratio: 1.6 (ref 1.2–2.2)
Albumin: 4.5 g/dL (ref 4.0–5.0)
Alkaline Phosphatase: 90 IU/L (ref 48–121)
BUN/Creatinine Ratio: 20 (ref 9–20)
BUN: 15 mg/dL (ref 6–24)
Bilirubin Total: 0.5 mg/dL (ref 0.0–1.2)
CO2: 28 mmol/L (ref 20–29)
Calcium: 9.4 mg/dL (ref 8.7–10.2)
Chloride: 103 mmol/L (ref 96–106)
Creatinine, Ser: 0.76 mg/dL (ref 0.76–1.27)
GFR calc Af Amer: 123 mL/min/{1.73_m2} (ref >59)
GFR calc non Af Amer: 106 mL/min/{1.73_m2} (ref >59)
Globulin, Total: 2.8 g/dL (ref 1.5–4.5)
Glucose: 97 mg/dL (ref 65–99)
Potassium: 4.3 mmol/L (ref 3.5–5.2)
Sodium: 141 mmol/L (ref 134–144)
Total Protein: 7.3 g/dL (ref 6.0–8.5)

## 2020-04-06 LAB — VITAMIN B12: Vitamin B-12: 356 pg/mL (ref 232–1245)

## 2020-04-06 LAB — T3: T3, Total: 147 ng/dL (ref 71–180)

## 2020-04-06 LAB — HEMOGLOBIN A1C
Est. average glucose Bld gHb Est-mCnc: 143 mg/dL
Hgb A1c MFr Bld: 6.6 % — ABNORMAL HIGH (ref 4.8–5.6)

## 2020-04-06 LAB — T4: T4, Total: 6.7 ug/dL (ref 4.5–12.0)

## 2020-04-06 LAB — VITAMIN D 25 HYDROXY (VIT D DEFICIENCY, FRACTURES): Vit D, 25-Hydroxy: 17.2 ng/mL — ABNORMAL LOW (ref 30.0–100.0)

## 2020-04-06 LAB — FOLATE: Folate: 9.6 ng/mL (ref >3.0)

## 2020-04-06 LAB — TSH: TSH: 1.01 u[IU]/mL (ref 0.450–4.500)

## 2020-04-06 LAB — INSULIN, RANDOM: INSULIN: 27.8 u[IU]/mL — ABNORMAL HIGH (ref 2.6–24.9)

## 2020-04-09 ENCOUNTER — Encounter (INDEPENDENT_AMBULATORY_CARE_PROVIDER_SITE_OTHER): Payer: Self-pay | Admitting: Family Medicine

## 2020-04-15 ENCOUNTER — Other Ambulatory Visit: Payer: Self-pay | Admitting: Cardiology

## 2020-04-19 ENCOUNTER — Ambulatory Visit (INDEPENDENT_AMBULATORY_CARE_PROVIDER_SITE_OTHER): Payer: Medicare Other | Admitting: Family Medicine

## 2020-04-19 ENCOUNTER — Other Ambulatory Visit (INDEPENDENT_AMBULATORY_CARE_PROVIDER_SITE_OTHER): Payer: Self-pay | Admitting: Family Medicine

## 2020-04-19 ENCOUNTER — Other Ambulatory Visit: Payer: Self-pay

## 2020-04-19 VITALS — BP 112/74 | HR 70 | Temp 98.1°F | Ht 72.0 in | Wt 290.0 lb

## 2020-04-19 DIAGNOSIS — E559 Vitamin D deficiency, unspecified: Secondary | ICD-10-CM | POA: Diagnosis not present

## 2020-04-19 DIAGNOSIS — I10 Essential (primary) hypertension: Secondary | ICD-10-CM

## 2020-04-19 DIAGNOSIS — Z6839 Body mass index (BMI) 39.0-39.9, adult: Secondary | ICD-10-CM | POA: Diagnosis not present

## 2020-04-19 DIAGNOSIS — G4733 Obstructive sleep apnea (adult) (pediatric): Secondary | ICD-10-CM

## 2020-04-19 DIAGNOSIS — E1169 Type 2 diabetes mellitus with other specified complication: Secondary | ICD-10-CM

## 2020-04-19 DIAGNOSIS — E1159 Type 2 diabetes mellitus with other circulatory complications: Secondary | ICD-10-CM

## 2020-04-19 DIAGNOSIS — Z9989 Dependence on other enabling machines and devices: Secondary | ICD-10-CM

## 2020-04-19 DIAGNOSIS — E785 Hyperlipidemia, unspecified: Secondary | ICD-10-CM

## 2020-04-19 MED ORDER — LOSARTAN POTASSIUM 25 MG PO TABS: 12.5000 mg | ORAL_TABLET | Freq: Every day | ORAL | 3 refills | Status: DC

## 2020-04-19 MED ORDER — VITAMIN D (ERGOCALCIFEROL) 1.25 MG (50000 UNIT) PO CAPS: 50000.0000 [IU] | ORAL_CAPSULE | ORAL | 0 refills | Status: DC

## 2020-04-19 MED ORDER — OZEMPIC (0.25 OR 0.5 MG/DOSE) 2 MG/1.5ML ~~LOC~~ SOPN: PEN_INJECTOR | SUBCUTANEOUS | 0 refills | Status: AC

## 2020-04-24 ENCOUNTER — Ambulatory Visit (INDEPENDENT_AMBULATORY_CARE_PROVIDER_SITE_OTHER): Payer: Medicare Other | Admitting: Pharmacist Clinician (PhC)/ Clinical Pharmacy Specialist

## 2020-04-24 ENCOUNTER — Encounter (INDEPENDENT_AMBULATORY_CARE_PROVIDER_SITE_OTHER): Payer: Self-pay | Admitting: Family Medicine

## 2020-04-24 ENCOUNTER — Other Ambulatory Visit: Payer: Self-pay

## 2020-04-24 DIAGNOSIS — I10 Essential (primary) hypertension: Secondary | ICD-10-CM

## 2020-04-24 MED ORDER — SPIRONOLACTONE 25 MG PO TABS: 12.5000 mg | ORAL_TABLET | Freq: Every day | ORAL | 3 refills | Status: DC

## 2020-04-24 NOTE — Progress Notes (Signed)
Chief Complaint:   OBESITY Rashaan is here to discuss his progress with his obesity treatment plan along with follow-up of his obesity related diagnoses. Larenz is on the Category 4 Plan and states he is following his eating plan approximately 50% of the time. Aylen states he is working on his house.  Today's visit was #: 2 Starting weight: 290 lbs Starting date: 04/05/2020 Today's weight: 290 lbs Today's date: 04/19/2020 Total lbs lost to date: 0 Total lbs lost since last in-office visit: 0  Interim History: Cael has been finishing and replacing wood floors in his home- physically working very hard.  He has been eating breakfast and occasionally dinner- usually skips lunch b/c too busy working on remodel.  He eats salads at night for dinner with only 7 ounces of protein.  The other 50% of the time, he is not eating dinner.  He is also here to review and discuss recent labs we did on 04/05/20  Subjective:   1. Hypertension associated with type 2 diabetes mellitus (HCC) Review: taking medications as instructed, no medication side effects noted, no chest pain on exertion, no dyspnea on exertion, no swelling of ankles.  He has not been taking his blood pressure medication regularly since changing the meal plan.  Blood pressure is under much better control now with change in diet per pt.  Running 120s/70s, and was even 88/61 once, and he says he "felt weird" with that.  "kind of lightheaded"- sx were transient and short lived.    BP Readings from Last 3 Encounters:  04/24/20 112/70  04/19/20 112/74  04/05/20 118/84   2. Type 2 diabetes mellitus with other specified complication, without long-term current use of insulin (HCC) Medications reviewed. Diabetic ROS: no polyuria or polydipsia, no chest pain, dyspnea or TIA's, no numbness, tingling or pain in extremities.  Fasting blood sugars run 102-130. 2-hour postprandial - 158.  No lows or highs.  Lab Results  Component Value Date   HGBA1C  6.6 (H) 04/05/2020   HGBA1C 6.6 (H) 01/31/2020   Lab Results  Component Value Date   LDLCALC 113 (H) 01/31/2020   CREATININE 0.76 04/05/2020   Lab Results  Component Value Date   INSULIN 27.8 (H) 04/05/2020   3. Hyperlipidemia associated with type 2 diabetes mellitus (HCC) Kenniel has hyperlipidemia and has been trying to improve his cholesterol levels with intensive lifestyle modification including a low saturated fat diet, exercise and weight loss. He denies any chest pain, claudication or myalgias.  Low HDL of 36 and LDL at 113.  Lab Results  Component Value Date   ALT 17 04/05/2020   AST 27 04/05/2020   ALKPHOS 90 04/05/2020   BILITOT 0.5 04/05/2020   Lab Results  Component Value Date   CHOL 169 01/31/2020   HDL 36 (L) 01/31/2020   LDLCALC 113 (H) 01/31/2020   TRIG 108 01/31/2020   CHOLHDL 4.7 01/31/2020   4. OSA on CPAP [place] has a diagnosis of sleep apnea. He reports that he is using a CPAP regularly.  Stable using machine without issues or concerns.  5. Vitamin D deficiency Joaovictor's Vitamin D level was 17.2 on 04/05/2020. He is currently taking no vitamin D supplement. He denies nausea, vomiting or muscle weakness.  Assessment/Plan:   1. Hypertension associated with type 2 diabetes mellitus (HCC)  Unstable: BP been running low and pt likely had symptomatic episode.    Discussed labs with patient today.  Octavious is working on healthy weight loss  and exercise to improve blood pressure control.  We will continue to watch for signs of hypotension as he continues his lifestyle modifications and cuts meds in half.   --> Cut losartan in half due to his low blood pressures- change to 12.5 mg per day now and closely monitor his blood pressure.  He will call his cardiologist regarding if okay to decrease Coreg if needed in near future with continued wt loss. Pt understands a B-Bl can commonly cause orthostatic hypotension  -Change losartan dose--->  (COZAAR) 25 MG tablet; Take 0.5  tablets (12.5 mg total) by mouth daily.    2. Type 2 diabetes mellitus with other specified complication, without long-term current use of insulin (HCC)  Unstable/ not at goal  Discussed labs with patient today.  Good blood sugar control is important to decrease the likelihood of diabetic complications such as nephropathy, neuropathy, limb loss, blindness, coronary artery disease, and death.  Intensive lifestyle modification including diet, exercise and weight loss are the first line of treatment for diabetes.   Diet controlled currently.  A1c is 6.6.   Long discussion had re: starting a new medication for better A1c control and to aid with wt loss.  The patient denies a personal or family history of medullary thyroid cancer or MEN II. The patient denies a history of pancreatitis   The potential risks and benefits of Ozempic were reviewed with the patient, and alternative treatment options were discussed.   All questions were answered, and the patient wishes to move forward with this medication.  Start Ozempic, as per below. - Semaglutide,0.25 or 0.5MG /DOS, (OZEMPIC, 0.25 OR 0.5 MG/DOSE,) 2 MG/1.5ML SOPN; Inject 0.1875 mLs (0.25 mg total) into the skin once a week for 14 days, THEN 0.375 mLs (0.5 mg total) once a week for 14 days.  Dispense: 1.5 mL; Refill: 0   3. Hyperlipidemia associated with type 2 diabetes mellitus (HCC)  Not at goal/ poorly controlled  Discussed labs with patient today.    Cardiovascular risk and specific lipid/LDL goals reviewed.   The 10-year ASCVD risk score Denman George(Goff DC Montez HagemanJr., et al., 2013) is: 13.3%   Values used to calculate the score:     Age: [age] years     Sex: Male     Is Non-Hispanic African American: Yes     Diabetic: Yes     Tobacco smoker: No     Systolic Blood Pressure: 112 mmHg     Is BP treated: Yes     HDL Cholesterol: 36 mg/dL     Total Cholesterol: 169 mg/dL  Crestor was started by Cardiology on 04/03/2020.  Pt understands We discussed  several lifestyle modifications today and Fayrene FearingJames will continue to work on diet, exercise and weight loss efforts.  Orders and follow up as documented in patient record.   -LDL goal is 70 or less and he is not at goal.    Counseling Intensive lifestyle modifications are the first line treatment for this issue. . Dietary changes: Increase soluble fiber. Decrease simple carbohydrates. . Exercise changes: Moderate to vigorous-intensity aerobic activity 150 minutes per week if tolerated. . Lipid-lowering medications: see documented in medical record.   4. OSA on CPAP Intensive lifestyle modifications are the first line treatment for this issue. We discussed lifestyle modifications today and he will continue to work on diet, exercise and weight loss efforts. We will continue to monitor. Encouraged adherence to using q hs and discussed importance of that   5. Vitamin D deficiency New onset  Discussed labs with patient today.   Low Vitamin D level contributes to fatigue and are associated with obesity, breast, and colon cancer. He agrees to start to take prescription Vitamin D @50 ,000 IU every week and will follow-up for routine testing of Vitamin D, at least 2-3 times per year to avoid over-replacement.   -Risks and benefits of medications discussed with patient, including alternative treatment options and appropriate lifestyle changes.   Patient will read drug information to further educate self about drug prior to starting it.  Contact prior with any Q's/ concerns.  -Start Vitamin D, Ergocalciferol, (DRISDOL) 1.25 MG (50000 UNIT) CAPS capsule; Take 1 capsule (50,000 Units total) by mouth every 7 (seven) days.  Dispense: 4 capsule; Refill: 0   6. Class 2 severe obesity with serious comorbidity and body mass index (BMI) of 39.0 to 39.9 in adult, unspecified obesity type [facility]) Chares is currently in the action stage of change. As such, his goal is to continue with weight loss efforts. He has  agreed to the Category 4 Plan.   Exercise goals: As is.  Behavioral modification strategies: increasing lean protein intake, increasing vegetables, no skipping meals, meal planning and cooking strategies and planning for success.    Solmon has agreed to follow-up with our clinic in 2 weeks. He was informed of the importance of frequent follow-up visits to maximize his success with intensive lifestyle modifications for his multiple health conditions.   Objective:   Blood pressure 112/74, pulse 70, temperature 98.1 F (36.7 C), temperature source Oral, height 6' (1.829 m), weight 290 lb (131.5 kg), SpO2 99 %. Body mass index is 39.33 kg/m.  General: Cooperative, alert, well developed, in no acute distress. HEENT: Conjunctivae and lids unremarkable. Cardiovascular: Regular rhythm.  Lungs: Normal work of breathing. Neurologic: No focal deficits.   Lab Results  Component Value Date   CREATININE 0.76 04/05/2020   BUN 15 04/05/2020   NA 141 04/05/2020   K 4.3 04/05/2020   CL 103 04/05/2020   CO2 28 04/05/2020   Lab Results  Component Value Date   ALT 17 04/05/2020   AST 27 04/05/2020   ALKPHOS 90 04/05/2020   BILITOT 0.5 04/05/2020   Lab Results  Component Value Date   HGBA1C 6.6 (H) 04/05/2020   HGBA1C 6.6 (H) 01/31/2020   Lab Results  Component Value Date   INSULIN 27.8 (H) 04/05/2020   Lab Results  Component Value Date   TSH 1.010 04/05/2020   Lab Results  Component Value Date   CHOL 169 01/31/2020   HDL 36 (L) 01/31/2020   LDLCALC 113 (H) 01/31/2020   TRIG 108 01/31/2020   CHOLHDL 4.7 01/31/2020   Lab Results  Component Value Date   WBC 6.9 04/05/2020   HGB 13.4 04/05/2020   HCT 40.4 04/05/2020   MCV 87 04/05/2020   PLT 177 04/05/2020   Obesity Behavioral Intervention:   Approximately 15 minutes were spent on the discussion below. ASK: We discussed the diagnosis of obesity with 04/07/2020 today and Torre agreed to give Fayrene Fearing permission to discuss obesity  behavioral modification therapy today.  ASSESS: Craig has the diagnosis of obesity and his BMI today is 39.4. Shermar is in the action stage of change.   ADVISE: Arless was educated on the multiple health risks of obesity as well as the benefit of weight loss to improve his health. He was advised of the need for long term treatment and the importance of lifestyle modifications to improve his current health  and to decrease his risk of future health problems.  AGREE: Multiple dietary modification options and treatment options were discussed and Jaquavious agreed to follow the recommendations documented in the above note.  ARRANGE: Dael was educated on the importance of frequent visits to treat obesity as outlined per CMS and USPSTF guidelines and agreed to schedule his next follow up appointment today.  Attestation Statements:   Reviewed by clinician on day of visit: allergies, medications, problem list, medical history, surgical history, family history, social history, and previous encounter notes.  I, Insurance claims handler, CMA, am acting as Energy manager for [employer], DO.  I have reviewed the above documentation for accuracy and completeness, and I agree with the above. Thomasene Lot, DO

## 2020-04-24 NOTE — Progress Notes (Signed)
04/25/2020 [name] 11/27/1970 [id number]   HPI:  Ricky [name] is a 49 y.o. male patient of Dr Bjorn Pippin, with a PMH below who presents today for hypertension clinic evaluation.  We have seen several times this year in [facility] for hypertension and he has responded well to therapy.  A renin aldosterone lab was drawn in May due to issues with hypokalemia, however the aldosterone an ratio both came back at normal with only a slightly elevated renin level.  At our last visit he was well controlled on amlodipine 5, Edarbyclor 40/25, carvedilol 25 bid and spironolactone 25.  Since then he saw Dr. Bjorn Pippin after a hypotensive incident.  He had been working in his yard in the summer heat and become dizzy/lightheaded.  He went in the house and found his pressure to be 85/52.  He then ate some pretzels and drank a bottle of water.  Symptoms resolved without incident that afternoon.  He noticed that evening that his pressure went up, but only to 99/66.  When he saw Dr. Bjorn Pippin it was noted that he had a 20+ pound weight loss and his meds were changed, assuming that the hypotension was due in part to the improved BMI as well as some dehydration.  Over the course of 2-3 weeks before and after that day he was asked to stop all except carvedilol, and add in low dose losartan 25 mg daily.    Today he returns for a previously scheduled 3 month BP follow up.  Home readings continue to look good, with no symptoms of hypotension.  Patient realized after [facility] appointment that there was not a weight loss and he was still in the 290 range he had always been.  Because of this, he was hesitant to make any changes to him medications, and did not change anything.  Dr. Bjorn Pippin suggested that ideally he should be on full dose carvedioll due to PVC issues.  Past Medical History: PVC's Original monitor showed 23% of beats, down to 5.3% with full dose carvedilol  Aortic aneurysm Measuring 4.7 cm  hyperlipidemia 6/21:   TC 169, TG 108, HDL 36, LDL 113-on rosuvastatin 10 mg daily  OSA Compliant with CPAP     Blood Pressure Goal:  130/80  Current Medications:   AM:  edarbyclor 40/25, amlodipine 5, carvedilol 25, spironolactone 12.5  PM:  Carvedilol 25, spironolactone 12.5  Family Hx: fatther died at 62, no heart disease, MD; mother living at 21 with hypertension maybe 20 years; 2 sbs no issues; 2 sons, no indication of issue 71 and 46 (older son DM1)  Social Hx: no tobacco or alcohol; no caffeine; decaf coffee and tea, occasional sprite  Diet: mix of eat out and home, no salt at home; eats lots of vegetables; not big on sweets  Exercise: nothing regular  Home BP readings: mornings -19 readings average 121/77, evenings -13 readings average 130/80  Intolerances: nkda  Labs: 8/21:  Na 141, K 4.3, Glu 97, BUN 15, SCr 0.76  Wt Readings from Last 3 Encounters:  04/24/20 297 lb (134.7 kg)  04/19/20 290 lb (131.5 kg)  04/05/20 290 lb (131.5 kg)   BP Readings from Last 3 Encounters:  04/24/20 112/70  04/19/20 112/74  04/05/20 118/84   Pulse Readings from Last 3 Encounters:  04/24/20 72  04/19/20 70  04/05/20 61    Current Outpatient Medications  Medication Sig Dispense Refill  . Blood Pressure Monitoring (BLOOD PRESSURE CUFF) MISC XL Adult blood pressure  cuff 1 each 0  . carvedilol (COREG) 25 MG tablet Take 25 mg by mouth 2 (two) times daily with a meal.    . cyclobenzaprine (FLEXERIL) 10 MG tablet Take 1 tablet (10 mg total) by mouth 2 (two) times daily as needed for muscle spasms. 20 tablet 0  . dicyclomine (BENTYL) 20 MG tablet Take 1 tablet (20 mg total) by mouth 2 (two) times daily between meals as needed for spasms. 20 tablet 0  . omeprazole (PRILOSEC) 20 MG capsule Take 1 capsule (20 mg total) by mouth daily. 30 capsule 0  . rosuvastatin (CRESTOR) 10 MG tablet Take 1 tablet (10 mg total) by mouth daily. 90 tablet 3  . sildenafil (VIAGRA) 100 MG tablet TAKE 1/2 TO 1 TABLET BY MOUTH AS  NEEDED 10 tablet 0  . Semaglutide,0.25 or 0.5MG /DOS, (OZEMPIC, 0.25 OR 0.5 MG/DOSE,) 2 MG/1.5ML SOPN Inject 0.1875 mLs (0.25 mg total) into the skin once a week for 14 days, THEN 0.375 mLs (0.5 mg total) once a week for 14 days. (Patient not taking: Reported on 04/24/2020) 1.5 mL 0  . spironolactone (ALDACTONE) 25 MG tablet Take 0.5 tablets (12.5 mg total) by mouth daily. 45 tablet 3  . Vitamin D, Ergocalciferol, (DRISDOL) 1.25 MG (50000 UNIT) CAPS capsule Take 1 capsule (50,000 Units total) by mouth every 7 (seven) days. (Patient not taking: Reported on 04/24/2020) 4 capsule 0   No current facility-administered medications for this visit.    No Known Allergies  Past Medical History:  Diagnosis Date  . Anxiety   . Back pain   . 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 Chest pain   . Depression   . High cholesterol   . Hypertension   . Osteoarthritis   . Osteoarthritis   . Overweight   . SOB (shortness of breath)   . Thrombocytopenia (HCC) 07/07/2012   138,000 08/26/11; 98,000 06/29/12    Blood pressure 112/70, pulse 72, height 6' (1.829 m), weight 297 lb (134.7 kg), SpO2 97 %.  Hypertension Patient with essential hypertension, well controlled, with several episodes of hypotension in the past month.  He has not actually lost any weight, although he continues to work on dietary improvements. He has recently started working with [facility] and was noted to be diabetic, with an A1c of 6.6.  Because of the occasional low BP readings, I am going to discontinue his amlodipine for now and continue other medications.  He will continue with home BP monitoring and we will see him in another month.  As he starts to lose weight, we can hopefully continue to ease back on medications.  I would like to stop the Edarabyclor, as it is brand name, and maybe give her valsartan or valsartan hctz, however he still has a 2 month supply.  We will keep this in  mind as we move forward.    Ricky [name] PharmD CPP [facility] [address] [place], [place] [address] [phone]

## 2020-04-24 NOTE — Patient Instructions (Addendum)
Return for a a follow up appointment with Dr. Bjorn Pippin in October  Check your blood pressure at home daily and keep record of the readings.  If you have any questions or concerns, please call Chigozie Basaldua/Raquel at [phone] or send a My Chart Message to [facility]  Take your BP meds as follows:  AM: Edarbyclor 40/25 mg, carvedilol 25 mg,   PM: carvedilol 25 mg, spironolactone 12.5 mg  Take medication daily as above unless BP is < 100/60 or you are feeling symptomatic.  Bring all of your meds, your BP cuff and your record of home blood pressures to your next appointment.  Exercise as you're able, try to walk approximately 30 minutes per day.  Keep salt intake to a minimum, especially watch canned and prepared boxed foods.  Eat more fresh fruits and vegetables and fewer canned items.  Avoid eating in fast food restaurants.    HOW TO TAKE YOUR BLOOD PRESSURE: . Rest 5 minutes before taking your blood pressure. .  Don't smoke or drink caffeinated beverages for at least 30 minutes before. . Take your blood pressure before (not after) you eat. . Sit comfortably with your back supported and both feet on the floor (don't cross your legs). . Elevate your arm to heart level on a table or a desk. . Use the proper sized cuff. It should fit smoothly and snugly around your bare upper arm. There should be enough room to slip a fingertip under the cuff. The bottom edge of the cuff should be 1 inch above the crease of the elbow. . Ideally, take 3 measurements at one sitting and record the average.

## 2020-04-25 ENCOUNTER — Encounter: Payer: Self-pay | Admitting: Pharmacist Clinician (PhC)/ Clinical Pharmacy Specialist

## 2020-04-25 NOTE — Assessment & Plan Note (Signed)
Patient with essential hypertension, well controlled, with several episodes of hypotension in the past month.  He has not actually lost any weight, although he continues to work on dietary improvements. He has recently started working with [facility] and was noted to be diabetic, with an A1c of 6.6.  Because of the occasional low BP readings, I am going to discontinue his amlodipine for now and continue other medications.  He will continue with home BP monitoring and we will see him in another month.  As he starts to lose weight, we can hopefully continue to ease back on medications.  I would like to stop the Edarabyclor, as it is brand name, and maybe give her valsartan or valsartan hctz, however he still has a 2 month supply.  We will keep this in mind as we move forward.

## 2020-04-27 ENCOUNTER — Other Ambulatory Visit (INDEPENDENT_AMBULATORY_CARE_PROVIDER_SITE_OTHER): Payer: Self-pay | Admitting: Family Medicine

## 2020-04-27 DIAGNOSIS — E1169 Type 2 diabetes mellitus with other specified complication: Secondary | ICD-10-CM

## 2020-05-04 ENCOUNTER — Other Ambulatory Visit: Payer: Self-pay | Admitting: Cardiothoracic Surgery

## 2020-05-04 DIAGNOSIS — I712 Thoracic aortic aneurysm, without rupture, unspecified: Secondary | ICD-10-CM

## 2020-05-07 ENCOUNTER — Ambulatory Visit (INDEPENDENT_AMBULATORY_CARE_PROVIDER_SITE_OTHER): Payer: Medicare Other | Admitting: Family Medicine

## 2020-05-08 ENCOUNTER — Other Ambulatory Visit: Payer: Self-pay | Admitting: Cardiothoracic Surgery

## 2020-05-08 ENCOUNTER — Other Ambulatory Visit: Payer: Self-pay | Admitting: Thoracic Surgery (Cardiothoracic Vascular Surgery)

## 2020-05-08 DIAGNOSIS — I712 Thoracic aortic aneurysm, without rupture, unspecified: Secondary | ICD-10-CM

## 2020-06-03 NOTE — Progress Notes (Signed)
Cardiology Office Note:    Date:  06/05/2020   ID:  Ricky [name], DOB August 04, 1971, MRN [id number]  PCP:  Ricky [name], Ricky S, FNP  Cardiologist:  Little Ishikawahristopher L Ramonda Galyon, MD  Electrophysiologist:  None   Referring MD: Ricky [name], Ricky [name],*   Chief Complaint  Patient presents with   Hypertension    History of Present Illness:    Ricky SoxJames L Daniello is a 49 y.o. male with a hx of frequent PVCs, thoracic aortic aneurysm, OSA hypertension, thrombocytopenia who presents for follow-up.  He was initially seen on 07/22/2019 after he was referred by Caryn Beeakia Starkes-Perry, FNP for an evaluation of PVCs.  Had an ED visit on 07/10/2019 with chest pain/body aches and lightheadedness.  EKG showed bigeminy. TTE was done on 07/28/2019 which showed low normal systolic function (EF 50 to 55%), septal hypokinesis.  Also was notable for ascending aortic aneurysm measuring up to 48 mm.  Underwent CTA chest on 08/16/2019, which showed ascending aortic aneurysm measuring up to 47mm.  Cardiac monitor showed frequent PVCs (23% of beats) and 120 episodes of SVT, lasting up to 1 minute.  Lexiscan Myoview on 08/18/2019 showed no evidence of ischemia.  Diagnosed with OSA.  He was referred to Dr. Ladona Ridgelaylor in EP for evaluation of his PVCs, recommended uptitrating his beta-blocker as initial treatment and repeat monitor once on Coreg 25 mg twice daily.  Repeat monitor in 11/11/2019 showed significant improvement in PVC burden (5.3% of beats).  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  Since last clinic visit, he reports that he has been doing well.  Reports BP has been 110s to 70s at home.  Did have an episode of SBP down to 88, but was asymptomatic and has not had recurrence of low BP.  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   Eval Dr Trey SailorsMark Roy, Neurosurgery 10/13   Chest pain    Depression    High cholesterol    Hypertension    Osteoarthritis    Osteoarthritis    Overweight    SOB (shortness of breath)    Thrombocytopenia (HCC) 07/07/2012   138,000 08/26/11; 98,000 06/29/12    Past Surgical History:  Procedure Laterality Date   ADENOIDECTOMY     ANTERIOR FUSION CERVICAL SPINE     APPENDECTOMY     HERNIA REPAIR     IRRIGATION AND DEBRIDEMENT SEBACEOUS CYST     on scalp   KNEE ARTHROSCOPY     x2   LUMBAR FUSION     POSTERIOR FUSION CERVICAL SPINE     TONSILLECTOMY      Current Medications: Current Meds  Medication Sig   Blood Pressure Monitoring (BLOOD PRESSURE CUFF) MISC XL Adult blood pressure cuff   carvedilol (COREG) 25 MG tablet Take 25 mg by mouth 2 (two) times daily with a meal.   cyclobenzaprine (FLEXERIL) 10 MG tablet Take 1 tablet (10 mg total) by mouth 2 (two) times daily as needed for muscle spasms.   dicyclomine (BENTYL) 20 MG  tablet Take 1 tablet (20 mg total) by mouth 2 (two) times daily between meals as needed for spasms.   EDARBYCLOR 40-25 MG TABS Take 1 tablet by mouth daily.   sildenafil (VIAGRA) 100 MG tablet TAKE 1/2 TO 1 TABLET BY MOUTH AS NEEDED   spironolactone (ALDACTONE) 25 MG tablet Take 0.5 tablets (12.5 mg total) by mouth daily.   [DISCONTINUED] omeprazole (PRILOSEC) 20 MG capsule Take 1 capsule (20 mg total) by mouth daily.   [DISCONTINUED] Vitamin D, Ergocalciferol, (DRISDOL) 1.25 MG (50000 UNIT) CAPS capsule Take 1 capsule (50,000 Units total) by mouth every 7 (seven) days.     Allergies:   Patient has no known allergies.   Social History   Socioeconomic History   Marital  status: Single    Spouse name: Not on file   Number of children: Not on file   Years of education: Not on file   Highest education level: Not on file  Occupational History   Occupation: Disabled  Tobacco Use   Smoking status: Former Smoker    Packs/day: 0.50    Years: 6.00    Pack years: 3.00    Types: Cigarettes    Quit date: 2013    Years since quitting: 8.8   Smokeless tobacco: Never Used  Substance and Sexual Activity   Alcohol use: No   Drug use: No   Sexual activity: Not on file  Other Topics Concern   Not on file  Social History Narrative   Not on file   Social Determinants of Health   Financial Resource Strain:    Difficulty of Paying Living Expenses: Not on file  Food Insecurity:    Worried About Programme researcher, broadcasting/film/video in the Last Year: Not on file   [employer] of Food in the Last Year: Not on file  Transportation Needs:    Lack of Transportation (Medical): Not on file   Lack of Transportation (Non-Medical): Not on file  Physical Activity:    Days of Exercise per Week: Not on file   Minutes of Exercise per Session: Not on file  Stress:    Feeling of Stress : Not on file  Social Connections:    Frequency of Communication with Friends and Family: Not on file   Frequency of Social Gatherings with Friends and Family: Not on file   Attends Religious Services: Not on file   Active Member of Clubs or Organizations: Not on file   Attends Banker Meetings: Not on file   Marital Status: Not on file     Family History: No family history of heart disease  ROS:   Please see the history of present illness.    All other systems reviewed and are negative.  EKGs/Labs/Other Studies Reviewed:    The following studies were reviewed today:  EKG:  EKG is not ordered today.  The ekg ordered most recently demonstrates sinus rhythm, rate 76, PVCs  Recent Labs: 04/03/2020: Magnesium 1.8 04/05/2020: ALT 17; BUN 15; Creatinine, Ser 0.76;  Hemoglobin 13.4; Platelets 177; Potassium 4.3; Sodium 141; TSH 1.010  Recent Lipid Panel    Component Value Date/Time   CHOL 169 01/31/2020 0850   TRIG 108 01/31/2020 0850   HDL 36 (L) 01/31/2020 0850   CHOLHDL 4.7 01/31/2020 0850   LDLCALC 113 (H) 01/31/2020 0850    Physical Exam:    VS:  BP 110/70    Pulse 62    Ht 6' (1.829 m)    Wt 300 lb (136.1  kg)    SpO2 98%    BMI 40.69 kg/m   Repeat BP 106/76  Wt Readings from Last 3 Encounters:  06/05/20 300 lb (136.1 kg)  04/24/20 297 lb (134.7 kg)  04/19/20 290 lb (131.5 kg)     GEN:  Well nourished, well developed in no acute distress HEENT: Normal NECK: No JVD LYMPHATICS: No lymphadenopathy CARDIAC: RRR, no murmurs, rubs, gallops RESPIRATORY:  Clear to auscultation without rales, wheezing or rhonchi  ABDOMEN: Soft, non-tender, non-distended MUSCULOSKELETAL:  No edema; No deformity  SKIN: Warm and dry NEUROLOGIC:  Alert and oriented x 3 PSYCHIATRIC:  Normal affect    Cardiac monitor 08/21/19:  Frequent ventricular ectopy (23% of beats)  120 episodes of SVT, longest lasting 59 seconds at 152 bpm  One 4 beat episode of NSVT  Patient triggered events corresponded to sinus rhythm with ventricular ectopy, incluging bigeminy   4 days of data recorded on Zio monitor. Patient had a min HR of 46 bpm, max HR of 207 bpm, and avg HR of 77 bpm. Predominant underlying rhythm was Sinus Rhythm. No atrial fibrillation, high degree block, or pauses noted. There was one 4 beat run of NSVT and 120 runs of SVT, longest lasting 59 seconds at 152 bpm.  Isolated atrial was ectopy was rare (<1%).  Very frequent ventricular ectopy (23% of beats).  Episodes of bigeminy (longest lasting 132 seconds) and trigeminy (longest lasting 98 seconds).  There were 2 triggered events, corresponding to sinus rhythm with ventricular bigeminy.   No significant arrhythmias detected.  CTA chest 08/16/19: Aneurysmal dilatation of the ascending thoracic aorta up to 47  mm. Recommend semi-annual imaging followup by CTA or MRA and referral to cardiothoracic surgery if not already obtained. This recommendation follows 2010 ACCF/AHA/AATS/ACR/ASA/SCA/SCAI/SIR/STS/SVM Guidelines for the Diagnosis and Management of Patients With Thoracic Aortic Disease. Circulation. 2010; 121: I203-T597. Aortic aneurysm NOS (ICD10-I71.9)   Lexiscan Myoview 08/18/2019:  The study is normal.  This is a low risk study.   Normal pharmacologic nuclear stress test with no evidence for prior infarct or ischemia. LVEF not calculated. Frequent PVCs.  TTE 07/28/19:  1. Left ventricular ejection fraction, by visual estimation, is 50 to 55%. The left ventricle has low normal function. There is mildly increased left ventricular hypertrophy.  2. Mildly dilated left ventricular internal cavity size.  3. The left ventricle demonstrates regional wall motion abnormalities. Septal hypokinesis  4. Global right ventricle has normal systolic function.The right ventricular size is normal. No increase in right ventricular wall thickness.  5. Left atrial size was normal.  6. Right atrial size was normal.  7. The mitral valve is normal in structure. No evidence of mitral valve regurgitation.  8. The tricuspid valve is normal in structure. Tricuspid valve regurgitation is not demonstrated.  9. The aortic valve is tricuspid. Aortic valve regurgitation is not visualized. No evidence of aortic valve sclerosis or stenosis. 10. The inferior vena cava is normal in size with greater than 50% respiratory variability, suggesting right atrial pressure of 3 mmHg. 11. Aneurysm of the ascending aorta, measuring 48 mm.  ASSESSMENT:    1. Essential hypertension   2. PVC'[name] (premature ventricular contractions)   3. CAD in native artery   4. Hyperlipidemia, unspecified hyperlipidemia type   5. Thoracic aortic aneurysm without rupture (HCC)   6. OSA (obstructive sleep apnea)    PLAN:     Hypertension:  Previously was on amlodipine 5 mg daily, carvedilol 25 mg twice daily, spironolactone 25 mg daily, and  azilsartan-chlorthalidone 40-25 mg.  Given aortic aneurysm, recommend goal SBP less than 120.  Has improved significantly since starting CPAP and with weight loss, have been able to cut back on regimen -Continue carvedilol 25 mg twice daily, spironolactone 12.5 mg daily, azilsartan-chlorthalidone 40-25 mg.  Will check BMP/magnesium  PVCs:  frequent PVCs (23% of beats).  Symptomatic episodes on monitor appear to correspond to episodes of bigeminy.   TTE shows low normal systolic function (EF ~50%), septal hypokinesis.  No evidence of ischemia on Myoview.  Seen by EP, Dr Ricky Ridgel recommended titrating up coreg, with repeat monitor in 11/11/2019 showed significant improvement in PVC burden (5.3% of beats). -Significantly improved with carvedilol, will continue carvedilol   Aortic aneurysm: 4.7 cm ascending aortic aneurysm on CTA chest 12/18.  Repeat CTA chest 12/09/19 showed stable aneurysm, measured 4.5 cm.  Follows with Dr. Vickey [name] in cardiothoracic surgery, 70-month follow-up CT planned for 06/11/2020  CAD: Reports atypical chest pain.  Given frequent PVCs and hypokinesis seen on TTE, Lexiscan Myoview was done on 08/18/19, which showed no evidence of ischemia.  Coronary CTA on 12/08/2019 showed minimal nonobstructive CAD in mid LAD, calcium score 19 (84th percentile). -Started rosuvastatin 10 mg daily on 01/31/2020  Hyperlipidemia: LDL 113 on 01/31/2020.  Started rosuvastatin 10 mg daily.  Will recheck lipid panel  OSA: Reports compliance with CPAP  T2DM: reports started on Ozempic by PCP, A1c 6.6%.    RTC in 6 months  Medication Adjustments/Labs and Tests Ordered: Current medicines are reviewed at length with the patient today.  Concerns regarding medicines are outlined above.  Orders Placed This Encounter  Procedures   Basic metabolic panel   Magnesium   Lipid panel   No orders of the defined  types were placed in this encounter.   Patient Instructions  Medication Instructions:  Your physician recommends that you continue on your current medications as directed. Please refer to the Current Medication list given to you today.  *If you need a refill on your cardiac medications before your next appointment, please call your pharmacy*   Lab Work: BMET, Mag, Lipid today  If you have labs (blood work) drawn today and your tests are completely normal, you will receive your results only by:  MyChart Message (if you have MyChart) OR  A paper copy in the mail If you have any lab test that is abnormal or we need to change your treatment, we will call you to review the results.   Follow-Up: At [facility], you and your health needs are our priority.  As part of our continuing mission to provide you with exceptional heart care, we have created designated Provider Care Teams.  These Care Teams include your primary Cardiologist (physician) and Advanced Practice Providers (APPs -  Physician Assistants and Nurse Practitioners) who all work together to provide you with the care you need, when you need it.  We recommend signing up for the patient portal called "MyChart".  Sign up information is provided on this After Visit Summary.  MyChart is used to connect with patients for Virtual Visits (Telemedicine).  Patients are able to view lab/test results, encounter notes, upcoming appointments, etc.  Non-urgent messages can be sent to your provider as well.   To learn more about what you can do with MyChart, go to [email or web address].    Your next appointment:   6 month([name])  The format for your next appointment:   In Person  Provider:   Epifanio Lesches, MD  Signed, Little Ishikawa, MD  06/05/2020 12:57 PM    [facility]

## 2020-06-04 ENCOUNTER — Ambulatory Visit: Payer: Medicare Other | Admitting: Cardiothoracic Surgery

## 2020-06-05 ENCOUNTER — Encounter: Payer: Self-pay | Admitting: Cardiology

## 2020-06-05 ENCOUNTER — Ambulatory Visit: Payer: Medicare Other | Admitting: Cardiology

## 2020-06-05 ENCOUNTER — Other Ambulatory Visit: Payer: Self-pay

## 2020-06-05 VITALS — BP 110/70 | HR 62 | Ht 72.0 in | Wt 300.0 lb

## 2020-06-05 DIAGNOSIS — E785 Hyperlipidemia, unspecified: Secondary | ICD-10-CM

## 2020-06-05 DIAGNOSIS — I10 Essential (primary) hypertension: Secondary | ICD-10-CM | POA: Diagnosis not present

## 2020-06-05 DIAGNOSIS — I493 Ventricular premature depolarization: Secondary | ICD-10-CM | POA: Diagnosis not present

## 2020-06-05 DIAGNOSIS — I2510 Atherosclerotic heart disease of native coronary artery without angina pectoris: Secondary | ICD-10-CM | POA: Diagnosis not present

## 2020-06-05 DIAGNOSIS — G4733 Obstructive sleep apnea (adult) (pediatric): Secondary | ICD-10-CM

## 2020-06-05 DIAGNOSIS — I712 Thoracic aortic aneurysm, without rupture, unspecified: Secondary | ICD-10-CM

## 2020-06-05 LAB — BASIC METABOLIC PANEL
BUN/Creatinine Ratio: 13 (ref 9–20)
BUN: 11 mg/dL (ref 6–24)
CO2: 26 mmol/L (ref 20–29)
Calcium: 9 mg/dL (ref 8.7–10.2)
Chloride: 97 mmol/L (ref 96–106)
Creatinine, Ser: 0.84 mg/dL (ref 0.76–1.27)
GFR calc Af Amer: 118 mL/min/{1.73_m2} (ref >59)
GFR calc non Af Amer: 102 mL/min/{1.73_m2} (ref >59)
Glucose: 103 mg/dL — ABNORMAL HIGH (ref 65–99)
Potassium: 3.9 mmol/L (ref 3.5–5.2)
Sodium: 135 mmol/L (ref 134–144)

## 2020-06-05 LAB — MAGNESIUM: Magnesium: 2 mg/dL (ref 1.6–2.3)

## 2020-06-05 LAB — LIPID PANEL
Chol/HDL Ratio: 3.6 ratio (ref 0.0–5.0)
Cholesterol, Total: 126 mg/dL (ref 100–199)
HDL: 35 mg/dL — ABNORMAL LOW (ref >39)
LDL Chol Calc (NIH): 65 mg/dL (ref 0–99)
Triglycerides: 146 mg/dL (ref 0–149)
VLDL Cholesterol Cal: 26 mg/dL (ref 5–40)

## 2020-06-05 NOTE — Patient Instructions (Signed)
Medication Instructions:  Your physician recommends that you continue on your current medications as directed. Please refer to the Current Medication list given to you today.  *If you need a refill on your cardiac medications before your next appointment, please call your pharmacy*   Lab Work: BMET, Mag, Lipid today  If you have labs (blood work) drawn today and your tests are completely normal, you will receive your results only by: MyChart Message (if you have MyChart) OR A paper copy in the mail If you have any lab test that is abnormal or we need to change your treatment, we will call you to review the results.  Follow-Up: At [facility], you and your health needs are our priority.  As part of our continuing mission to provide you with exceptional heart care, we have created designated Provider Care Teams.  These Care Teams include your primary Cardiologist (physician) and Advanced Practice Providers (APPs -  Physician Assistants and Nurse Practitioners) who all work together to provide you with the care you need, when you need it.  We recommend signing up for the patient portal called "MyChart".  Sign up information is provided on this After Visit Summary.  MyChart is used to connect with patients for Virtual Visits (Telemedicine).  Patients are able to view lab/test results, encounter notes, upcoming appointments, etc.  Non-urgent messages can be sent to your provider as well.   To learn more about what you can do with MyChart, go to [email or web address].    Your next appointment:   6 month(s)  The format for your next appointment:   In Person  Provider:   Christopher Schumann, MD   

## 2020-06-11 ENCOUNTER — Ambulatory Visit: Payer: Medicare Other | Admitting: Cardiothoracic Surgery

## 2020-06-11 ENCOUNTER — Other Ambulatory Visit: Payer: Self-pay

## 2020-06-11 ENCOUNTER — Ambulatory Visit
Admission: RE | Admit: 2020-06-11 | Discharge: 2020-06-11 | Disposition: A | Discharge status: Home / Self Care | Payer: Medicare Other | Source: Ambulatory Visit | Attending: Cardiothoracic Surgery | Admitting: Cardiothoracic Surgery

## 2020-06-11 VITALS — BP 117/82 | HR 61 | Temp 98.1°F | Resp 20 | Ht 72.0 in | Wt 299.0 lb

## 2020-06-11 DIAGNOSIS — I712 Thoracic aortic aneurysm, without rupture, unspecified: Secondary | ICD-10-CM

## 2020-06-11 DIAGNOSIS — I7121 Aneurysm of the ascending aorta, without rupture: Secondary | ICD-10-CM

## 2020-06-12 NOTE — Progress Notes (Signed)
49 year old gentleman returns for routine follow-up of sub-5 cm ascending aortic aneurysm.  Since his last visit he has had good blood pressure control and has had no complaints of chest pain.  He has had a couple of episodes of fluttering in the chest but these were short-lived and limited Physical exam: BP 117/82   Pulse 61   Temp 98.1 F (36.7 C) (Skin)   Resp 20   Ht 6' (1.829 m)   Wt 135.6 kg   SpO2 97% Comment: RA  BMI 40.55 kg/m  Well-appearing man in no acute distress Neurologically intact Clear to auscultation bilaterally Regular rate and rhythm Pulses intact throughout  Imaging: I have personally reviewed his available CT scan from today which demonstrates a stable 4.8 cm ascending aortic aneurysm  Plan: Follow-up in 1 year with repeat CT scan Continue strict blood pressure control  Susana Duell Z. Vickey Sages, MD [phone]

## 2020-06-26 ENCOUNTER — Other Ambulatory Visit: Payer: Self-pay | Admitting: Cardiology

## 2020-07-11 ENCOUNTER — Other Ambulatory Visit: Payer: Self-pay

## 2020-07-11 ENCOUNTER — Ambulatory Visit: Payer: Medicare Other | Admitting: Cardiology

## 2020-07-11 VITALS — BP 132/76 | HR 54 | Ht 72.0 in | Wt 297.2 lb

## 2020-07-11 DIAGNOSIS — E785 Hyperlipidemia, unspecified: Secondary | ICD-10-CM

## 2020-07-11 DIAGNOSIS — R079 Chest pain, unspecified: Secondary | ICD-10-CM

## 2020-07-11 DIAGNOSIS — I10 Essential (primary) hypertension: Secondary | ICD-10-CM

## 2020-07-11 DIAGNOSIS — I2510 Atherosclerotic heart disease of native coronary artery without angina pectoris: Secondary | ICD-10-CM | POA: Diagnosis not present

## 2020-07-11 DIAGNOSIS — I493 Ventricular premature depolarization: Secondary | ICD-10-CM

## 2020-07-11 MED ORDER — OMEPRAZOLE 20 MG PO CPDR: 20.0000 mg | DELAYED_RELEASE_CAPSULE | Freq: Every day | ORAL | 1 refills | Status: DC

## 2020-07-11 MED ORDER — LOSARTAN POTASSIUM 100 MG PO TABS: 100.0000 mg | ORAL_TABLET | Freq: Every day | ORAL | 3 refills | Status: DC

## 2020-07-11 NOTE — Progress Notes (Signed)
Cardiology Office Note:    Date:  07/11/2020   ID:  Ricky [name], DOB 01-20-1971, MRN [id number]  PCP:  Maryagnes Amos, FNP  Cardiologist:  Little Ishikawa, MD  Electrophysiologist:  None   Referring MD: Maryagnes Amos   Chief Complaint  Patient presents with  . Chest Pain    History of Present Illness:    Ricky [name] is a 49 y.o. male with a hx of frequent PVCs, thoracic aortic aneurysm, OSA hypertension, thrombocytopenia who presents for follow-up.  He was initially seen on 07/22/2019 after he was referred by Caryn Bee, FNP for an evaluation of PVCs.  Had an ED visit on 07/10/2019 with chest pain/body aches and lightheadedness.  EKG showed bigeminy. TTE was done on 07/28/2019 which showed low normal systolic function (EF 50 to 55%), septal hypokinesis.  Also was notable for ascending aortic aneurysm measuring up to 48 mm.  Underwent CTA chest on 08/16/2019, which showed ascending aortic aneurysm measuring up to 47mm.  Cardiac monitor showed frequent PVCs (23% of beats) and 120 episodes of SVT, lasting up to 1 minute.  Lexiscan Myoview on 08/18/2019 showed no evidence of ischemia.  Diagnosed with OSA.  He was referred to Dr. Ladona Ridgel in EP for evaluation of his PVCs, recommended uptitrating his beta-blocker as initial treatment and repeat monitor once on Coreg 25 mg twice daily.  Repeat monitor in 11/11/2019 showed significant improvement in PVC burden (5.3% of beats).  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  Since last clinic visit, he reports that he had an episode of chest pain on 11/11.  Describes dull aching pain in center of chest, 5 out of 10 in intensity.  Reports pain in center of lower chest.  Lasted for 2 minutes and resolved.  Had a recurrence of pain on 11/20, lasted for 30 seconds.  States that he is been having some indigestion.  States that pain is not pleuritic or  positional.  Reports BP has been low, down to SBP 70s to 80s.  Does report some intermittent dizziness.  He has not been taking spironolactone, reports only took twice in the last month.  Did not take any of his medications today.   BP Readings from Last 3 Encounters:  07/11/20 132/76  06/11/20 117/82  06/05/20 110/70    Wt Readings from Last 3 Encounters:  07/11/20 297 lb 3.2 oz (134.8 kg)  06/11/20 299 lb (135.6 kg)  06/05/20 300 lb (136.1 kg)      Past Medical History:  Diagnosis Date  . Anxiety   . Back pain   . 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 Chest pain   . Depression   . High cholesterol   . Hypertension   . Osteoarthritis   . Osteoarthritis   . Overweight   . SOB (shortness of breath)   . Thrombocytopenia (HCC) 07/07/2012   138,000 08/26/11; 98,000 06/29/12    Past Surgical History:  Procedure Laterality Date  . ADENOIDECTOMY    . ANTERIOR FUSION CERVICAL SPINE    . APPENDECTOMY    . HERNIA REPAIR    . IRRIGATION AND DEBRIDEMENT SEBACEOUS CYST     on scalp  . KNEE ARTHROSCOPY     x2  . LUMBAR FUSION    . POSTERIOR FUSION CERVICAL SPINE    . TONSILLECTOMY      Current Medications: No outpatient medications have  been marked as taking for the 07/11/20 encounter (Office Visit) with Little IshikawaSchumann, Cedrik Heindl L, MD.     Allergies:   Patient has no known allergies.   Social History   Socioeconomic History  . Marital status: Single    Spouse name: Not on file  . Number of children: Not on file  . Years of education: Not on file  . Highest education level: Not on file  Occupational History  . Occupation: Disabled  Tobacco Use  . Smoking status: Former Smoker    Packs/day: 0.50    Years: 6.00    Pack years: 3.00    Types: Cigarettes    Quit date: 2013    Years since quitting: 8.9  . Smokeless tobacco: Never Used  Substance and Sexual Activity  . Alcohol use: No  . Drug use: No  . Sexual activity: Not on  file  Other Topics Concern  . Not on file  Social History Narrative  . Not on file   Social Determinants of Health   Financial Resource Strain:   . Difficulty of Paying Living Expenses: Not on file  Food Insecurity:   . Worried About Programme researcher, broadcasting/film/videounning Out of Food in the Last Year: Not on file  . Ran Out of Food in the Last Year: Not on file  Transportation Needs:   . Lack of Transportation (Medical): Not on file  . Lack of Transportation (Non-Medical): Not on file  Physical Activity:   . Days of Exercise per Week: Not on file  . Minutes of Exercise per Session: Not on file  Stress:   . Feeling of Stress : Not on file  Social Connections:   . Frequency of Communication with Friends and Family: Not on file  . Frequency of Social Gatherings with Friends and Family: Not on file  . Attends Religious Services: Not on file  . Active Member of Clubs or Organizations: Not on file  . Attends BankerClub or Organization Meetings: Not on file  . Marital Status: Not on file     Family History: No family history of heart disease  ROS:   Please see the history of present illness.    All other systems reviewed and are negative.  EKGs/Labs/Other Studies Reviewed:    The following studies were reviewed today:  EKG:  EKG is ordered today.  The ekg ordered today demonstrates sinus rhythm, rate 58, no PVCs  Recent Labs: 04/05/2020: ALT 17; Hemoglobin 13.4; Platelets 177; TSH 1.010 06/05/2020: BUN 11; Creatinine, Ser 0.84; Magnesium 2.0; Potassium 3.9; Sodium 135  Recent Lipid Panel    Component Value Date/Time   CHOL 126 06/05/2020 1022   TRIG 146 06/05/2020 1022   HDL 35 (L) 06/05/2020 1022   CHOLHDL 3.6 06/05/2020 1022   LDLCALC 65 06/05/2020 1022    Physical Exam:    VS:  BP 132/76   Pulse (!) 54   Ht 6' (1.829 m)   Wt 297 lb 3.2 oz (134.8 kg)   SpO2 97%   BMI 40.31 kg/m     Wt Readings from Last 3 Encounters:  07/11/20 297 lb 3.2 oz (134.8 kg)  06/11/20 299 lb (135.6 kg)  06/05/20  300 lb (136.1 kg)     GEN:  Well nourished, well developed in no acute distress HEENT: Normal NECK: No JVD LYMPHATICS: No lymphadenopathy CARDIAC: RRR, no murmurs, rubs, gallops RESPIRATORY:  Clear to auscultation without rales, wheezing or rhonchi  ABDOMEN: Soft, non-tender, non-distended MUSCULOSKELETAL:  No edema; No deformity  SKIN: Warm  and dry NEUROLOGIC:  Alert and oriented x 3 PSYCHIATRIC:  Normal affect    Cardiac monitor 08/21/19:  Frequent ventricular ectopy (23% of beats)  120 episodes of SVT, longest lasting 59 seconds at 152 bpm  One 4 beat episode of NSVT  Patient triggered events corresponded to sinus rhythm with ventricular ectopy, incluging bigeminy   4 days of data recorded on Zio monitor. Patient had a min HR of 46 bpm, max HR of 207 bpm, and avg HR of 77 bpm. Predominant underlying rhythm was Sinus Rhythm. No atrial fibrillation, high degree block, or pauses noted. There was one 4 beat run of NSVT and 120 runs of SVT, longest lasting 59 seconds at 152 bpm.  Isolated atrial was ectopy was rare (<1%).  Very frequent ventricular ectopy (23% of beats).  Episodes of bigeminy (longest lasting 132 seconds) and trigeminy (longest lasting 98 seconds).  There were 2 triggered events, corresponding to sinus rhythm with ventricular bigeminy.   No significant arrhythmias detected.  CTA chest 08/16/19: Aneurysmal dilatation of the ascending thoracic aorta up to 47 mm. Recommend semi-annual imaging followup by CTA or MRA and referral to cardiothoracic surgery if not already obtained. This recommendation follows 2010 ACCF/AHA/AATS/ACR/ASA/SCA/SCAI/SIR/STS/SVM Guidelines for the Diagnosis and Management of Patients With Thoracic Aortic Disease. Circulation. 2010; 121: A193-X902. Aortic aneurysm NOS (ICD10-I71.9)   Lexiscan Myoview 08/18/2019:  The study is normal.  This is a low risk study.   Normal pharmacologic nuclear stress test with no evidence for prior infarct or  ischemia. LVEF not calculated. Frequent PVCs.  TTE 07/28/19:  1. Left ventricular ejection fraction, by visual estimation, is 50 to 55%. The left ventricle has low normal function. There is mildly increased left ventricular hypertrophy.  2. Mildly dilated left ventricular internal cavity size.  3. The left ventricle demonstrates regional wall motion abnormalities. Septal hypokinesis  4. Global right ventricle has normal systolic function.The right ventricular size is normal. No increase in right ventricular wall thickness.  5. Left atrial size was normal.  6. Right atrial size was normal.  7. The mitral valve is normal in structure. No evidence of mitral valve regurgitation.  8. The tricuspid valve is normal in structure. Tricuspid valve regurgitation is not demonstrated.  9. The aortic valve is tricuspid. Aortic valve regurgitation is not visualized. No evidence of aortic valve sclerosis or stenosis. 10. The inferior vena cava is normal in size with greater than 50% respiratory variability, suggesting right atrial pressure of 3 mmHg. 11. Aneurysm of the ascending aorta, measuring 48 mm.  ASSESSMENT:    1. Essential hypertension   2. Hyperlipidemia, unspecified hyperlipidemia type   3. PVC's (premature ventricular contractions)   4. CAD in native artery   5. Chest pain of uncertain etiology    PLAN:     Hypertension: Previously was on amlodipine 5 mg daily, carvedilol 25 mg twice daily, spironolactone 25 mg daily, and azilsartan-chlorthalidone 40-25 mg.  Given aortic aneurysm, recommend goal SBP less than 120.  Has improved significantly since starting CPAP and with weight loss, have been able to cut back on regimen -Reports BP has been down to SBP 70s to 80s, and he has been having some dizziness.  He stopped taking spironolactone.  Will discontinue spironolactone.  We will also discontinue azilsartan-chlorthalidone 40-25 mg.  We will switch to losartan 100 mg daily.  Continue carvedilol  25 mg twice daily.  Check BMP in 1 week.  Asked patient to monitor BP twice daily for next 2 weeks and call  with results.  PVCs:  frequent PVCs (23% of beats).  Symptomatic episodes on monitor appear to correspond to episodes of bigeminy.   TTE shows low normal systolic function (EF ~50%), septal hypokinesis.  No evidence of ischemia on Myoview.  Seen by EP, Dr Ladona Ridgel recommended titrating up coreg, with repeat monitor in 11/11/2019 showed significant improvement in PVC burden (5.3% of beats). -Significantly improved with carvedilol, will continue carvedilol   Aortic aneurysm: 4.7 cm ascending aortic aneurysm on CTA chest 12/18.  Repeat CTA chest 12/09/19 showed stable aneurysm, measured 4.5 cm.  Follows with Dr. Vickey Sages in cardiothoracic surgery, 48-month follow-up  CT on 06/11/2020 showed stable aneurysm at 4.7 cm.  CAD: Reports atypical chest pain.  Given frequent PVCs and hypokinesis seen on TTE, Lexiscan Myoview was done on 08/18/19, which showed no evidence of ischemia.  Coronary CTA on 12/08/2019 showed minimal nonobstructive CAD in mid LAD, calcium score 19 (84th percentile). -Continue rosuvastatin 10 mg daily.  LDL at goal less than 70 -He currently reports atypical chest pain, suspect GERD.  Will start omeprazole 20 mg  Hyperlipidemia: LDL 113 on 01/31/2020.  Started rosuvastatin 10 mg daily, LDL 65 on 06/05/2020.  OSA: Reports compliance with CPAP  T2DM: reports started on Ozempic by PCP, A1c 6.6%.    RTC in 1 month  Medication Adjustments/Labs and Tests Ordered: Current medicines are reviewed at length with the patient today.  Concerns regarding medicines are outlined above.  Orders Placed This Encounter  Procedures  . Basic metabolic panel  . EKG 12-Lead   Meds ordered this encounter  Medications  . omeprazole (PRILOSEC) 20 MG capsule    Sig: Take 1 capsule (20 mg total) by mouth daily.    Dispense:  30 capsule    Refill:  1  . losartan (COZAAR) 100 MG tablet    Sig: Take 1  tablet (100 mg total) by mouth daily.    Dispense:  90 tablet    Refill:  3    STOP Edarbyclor    Patient Instructions  Medication Instructions:  STOP Edarbyclor STOP spironolactone START Losartan 100 mg daily START omeprazole 20 mg daily  *If you need a refill on your cardiac medications before your next appointment, please call your pharmacy*   Lab Work: BMET in 1 WEEK  If you have labs (blood work) drawn today and your tests are completely normal, you will receive your results only by: Marland Kitchen MyChart Message (if you have MyChart) OR . A paper copy in the mail If you have any lab test that is abnormal or we need to change your treatment, we will call you to review the results.    Follow-Up: At [facility], you and your health needs are our priority.  As part of our continuing mission to provide you with exceptional heart care, we have created designated Provider Care Teams.  These Care Teams include your primary Cardiologist (physician) and Advanced Practice Providers (APPs -  Physician Assistants and Nurse Practitioners) who all work together to provide you with the care you need, when you need it.  We recommend signing up for the patient portal called "MyChart".  Sign up information is provided on this After Visit Summary.  MyChart is used to connect with patients for Virtual Visits (Telemedicine).  Patients are able to view lab/test results, encounter notes, upcoming appointments, etc.  Non-urgent messages can be sent to your provider as well.   To learn more about what you can do with MyChart, go to [email or web address].  Your next appointment:   1 month(s)  The format for your next appointment:   In Person  Provider:   Epifanio Lesches, MD       Signed, Little Ishikawa, MD  07/11/2020 9:58 AM    [facility]

## 2020-07-11 NOTE — Patient Instructions (Signed)
Medication Instructions:  STOP Edarbyclor STOP spironolactone START Losartan 100 mg daily START omeprazole 20 mg daily  *If you need a refill on your cardiac medications before your next appointment, please call your pharmacy*   Lab Work: BMET in 1 WEEK  If you have labs (blood work) drawn today and your tests are completely normal, you will receive your results only by: Marland Kitchen MyChart Message (if you have MyChart) OR . A paper copy in the mail If you have any lab test that is abnormal or we need to change your treatment, we will call you to review the results.    Follow-Up: At [facility], you and your health needs are our priority.  As part of our continuing mission to provide you with exceptional heart care, we have created designated Provider Care Teams.  These Care Teams include your primary Cardiologist (physician) and Advanced Practice Providers (APPs -  Physician Assistants and Nurse Practitioners) who all work together to provide you with the care you need, when you need it.  We recommend signing up for the patient portal called "MyChart".  Sign up information is provided on this After Visit Summary.  MyChart is used to connect with patients for Virtual Visits (Telemedicine).  Patients are able to view lab/test results, encounter notes, upcoming appointments, etc.  Non-urgent messages can be sent to your provider as well.   To learn more about what you can do with MyChart, go to [email or web address].    Your next appointment:   1 month(s)  The format for your next appointment:   In Person  Provider:   Epifanio Lesches, MD

## 2020-07-23 ENCOUNTER — Other Ambulatory Visit: Payer: Self-pay

## 2020-07-23 ENCOUNTER — Encounter: Payer: Self-pay | Admitting: Cardiology

## 2020-07-23 ENCOUNTER — Ambulatory Visit: Payer: Medicare Other | Admitting: Cardiology

## 2020-07-23 VITALS — BP 160/110 | HR 58 | Ht 72.0 in | Wt 302.0 lb

## 2020-07-23 DIAGNOSIS — I2510 Atherosclerotic heart disease of native coronary artery without angina pectoris: Secondary | ICD-10-CM | POA: Diagnosis not present

## 2020-07-23 DIAGNOSIS — G4733 Obstructive sleep apnea (adult) (pediatric): Secondary | ICD-10-CM

## 2020-07-23 DIAGNOSIS — I712 Thoracic aortic aneurysm, without rupture, unspecified: Secondary | ICD-10-CM

## 2020-07-23 DIAGNOSIS — I10 Essential (primary) hypertension: Secondary | ICD-10-CM

## 2020-07-23 DIAGNOSIS — I493 Ventricular premature depolarization: Secondary | ICD-10-CM | POA: Diagnosis not present

## 2020-07-23 DIAGNOSIS — E785 Hyperlipidemia, unspecified: Secondary | ICD-10-CM

## 2020-07-23 MED ORDER — AZILSARTAN-CHLORTHALIDONE 40-25 MG PO TABS: 1.0000 | ORAL_TABLET | Freq: Every day | ORAL | 3 refills | Status: DC

## 2020-07-23 MED ORDER — SPIRONOLACTONE 25 MG PO TABS: 25.0000 mg | ORAL_TABLET | Freq: Every day | ORAL | 3 refills | Status: DC

## 2020-07-23 NOTE — Progress Notes (Signed)
Cardiology Office Note:    Date:  07/23/2020   ID:  Ricky [name], DOB [birth year], MRN [id number]  PCP:  Maryagnes Amos, FNP  Cardiologist:  Little Ishikawa, MD  Electrophysiologist:  None   Referring MD: Maryagnes Amos   Chief Complaint  Patient presents with  . Hypertension    History of Present Illness:    Ricky [name] is a 49 y.o. male with a hx of frequent PVCs, thoracic aortic aneurysm, OSA hypertension, thrombocytopenia who presents for follow-up.  He was initially seen on 07/22/2019 after he was referred by Caryn Bee, FNP for an evaluation of PVCs.  Had an ED visit on 07/10/2019 with chest pain/body aches and lightheadedness.  EKG showed bigeminy. TTE was done on 07/28/2019 which showed low normal systolic function (EF 50 to 55%), septal hypokinesis.  Also was notable for ascending aortic aneurysm measuring up to 48 mm.  Underwent CTA chest on 08/16/2019, which showed ascending aortic aneurysm measuring up to 36mm.  Cardiac monitor showed frequent PVCs (23% of beats) and 120 episodes of SVT, lasting up to 1 minute.  Lexiscan Myoview on 08/18/2019 showed no evidence of ischemia.  Diagnosed with OSA.  He was referred to Dr. Ladona Ridgel in EP for evaluation of his PVCs, recommended uptitrating his beta-blocker as initial treatment and repeat monitor once on Coreg 25 mg twice daily.  Repeat monitor in 11/11/2019 showed significant improvement in PVC burden (5.3% of beats).  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  Since last clinic visit, he reports recent worsening in his hypertension.  At last clinic visit, was having soft pressures so his azilsartan-chlorthalidone was switched to losartan 100 mg daily.  Reports BP initially was in the 130s but over the weekend got up to the 160s-170s.  He switched back to the azilsartan-chlorthalidone 40-25 mg on Saturday.  Also started taking  spironolactone 12.5 mg in the evening.  Reports to have intermittent chest pain that lasts for few seconds and resolves.  Did report some dyspnea on exertion.  Reports has been very anxious.    BP Readings from Last 3 Encounters:  07/23/20 (!) 160/110  07/11/20 132/76  06/11/20 117/82    Wt Readings from Last 3 Encounters:  07/23/20 (!) 302 lb (137 kg)  07/11/20 297 lb 3.2 oz (134.8 kg)  06/11/20 299 lb (135.6 kg)      Past Medical History:  Diagnosis Date  . Anxiety   . Back pain   . 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 Chest pain   . Depression   . High cholesterol   . Hypertension   . Osteoarthritis   . Osteoarthritis   . Overweight   . SOB (shortness of breath)   . Thrombocytopenia (HCC) 07/07/2012   138,000 08/26/11; 98,000 06/29/12    Past Surgical History:  Procedure Laterality Date  . ADENOIDECTOMY    . ANTERIOR FUSION CERVICAL SPINE    . APPENDECTOMY    . HERNIA REPAIR    . IRRIGATION AND DEBRIDEMENT SEBACEOUS CYST     on scalp  . KNEE ARTHROSCOPY     x2  . LUMBAR FUSION    . POSTERIOR FUSION CERVICAL SPINE    . TONSILLECTOMY      Current Medications: Current Meds  Medication Sig  . Blood Pressure Monitoring (BLOOD PRESSURE CUFF) MISC XL Adult blood pressure cuff  . carvedilol (COREG) 25 MG  tablet Take 25 mg by mouth 2 (two) times daily with a meal.  . cyclobenzaprine (FLEXERIL) 10 MG tablet Take 1 tablet (10 mg total) by mouth 2 (two) times daily as needed for muscle spasms.  Marland Kitchen. dicyclomine (BENTYL) 20 MG tablet Take 1 tablet (20 mg total) by mouth 2 (two) times daily between meals as needed for spasms.  Marland Kitchen. omeprazole (PRILOSEC) 20 MG capsule Take 1 capsule (20 mg total) by mouth daily.  . sildenafil (VIAGRA) 100 MG tablet TAKE 1/2 TO 1 TABLET BY MOUTH AS NEEDED  . [DISCONTINUED] losartan (COZAAR) 100 MG tablet Take 1 tablet (100 mg total) by mouth daily.     Allergies:   Patient has no known allergies.    Social History   Socioeconomic History  . Marital status: Single    Spouse name: Not on file  . Number of children: Not on file  . Years of education: Not on file  . Highest education level: Not on file  Occupational History  . Occupation: Disabled  Tobacco Use  . Smoking status: Former Smoker    Packs/day: 0.50    Years: 6.00    Pack years: 3.00    Types: Cigarettes    Quit date: 2013    Years since quitting: 8.9  . Smokeless tobacco: Never Used  Substance and Sexual Activity  . Alcohol use: No  . Drug use: No  . Sexual activity: Not on file  Other Topics Concern  . Not on file  Social History Narrative  . Not on file   Social Determinants of Health   Financial Resource Strain:   . Difficulty of Paying Living Expenses: Not on file  Food Insecurity:   . Worried About Programme researcher, broadcasting/film/videounning Out of Food in the Last Year: Not on file  . Ran Out of Food in the Last Year: Not on file  Transportation Needs:   . Lack of Transportation (Medical): Not on file  . Lack of Transportation (Non-Medical): Not on file  Physical Activity:   . Days of Exercise per Week: Not on file  . Minutes of Exercise per Session: Not on file  Stress:   . Feeling of Stress : Not on file  Social Connections:   . Frequency of Communication with Friends and Family: Not on file  . Frequency of Social Gatherings with Friends and Family: Not on file  . Attends Religious Services: Not on file  . Active Member of Clubs or Organizations: Not on file  . Attends BankerClub or Organization Meetings: Not on file  . Marital Status: Not on file     Family History: No family history of heart disease  ROS:   Please see the history of present illness.    All other systems reviewed and are negative.  EKGs/Labs/Other Studies Reviewed:    The following studies were reviewed today:  EKG:  EKG is ordered today.  The ekg ordered today demonstrates sinus rhythm, rate 52, PVC  Recent Labs: 04/05/2020: TSH 1.010 06/05/2020:  Magnesium 2.0 07/23/2020: ALT WILL FOLLOW; BUN WILL FOLLOW; Creatinine, Ser WILL FOLLOW; Hemoglobin 14.1; Platelets 148; Potassium WILL FOLLOW; Sodium WILL FOLLOW  Recent Lipid Panel    Component Value Date/Time   CHOL 126 06/05/2020 1022   TRIG 146 06/05/2020 1022   HDL 35 (L) 06/05/2020 1022   CHOLHDL 3.6 06/05/2020 1022   LDLCALC 65 06/05/2020 1022    Physical Exam:    VS:  BP (!) 160/110   Pulse (!) 58   Ht 6' (  1.829 m)   Wt (!) 302 lb (137 kg)   SpO2 96%   BMI 40.96 kg/m     Wt Readings from Last 3 Encounters:  07/23/20 (!) 302 lb (137 kg)  07/11/20 297 lb 3.2 oz (134.8 kg)  06/11/20 299 lb (135.6 kg)     GEN:  Well nourished, well developed in no acute distress HEENT: Normal NECK: No JVD LYMPHATICS: No lymphadenopathy CARDIAC: RRR, no murmurs, rubs, gallops RESPIRATORY:  Clear to auscultation without rales, wheezing or rhonchi  ABDOMEN: Soft, non-tender, non-distended MUSCULOSKELETAL:  No edema; No deformity  SKIN: Warm and dry NEUROLOGIC:  Alert and oriented x 3 PSYCHIATRIC:  Normal affect    Cardiac monitor 08/21/19:  Frequent ventricular ectopy (23% of beats)  120 episodes of SVT, longest lasting 59 seconds at 152 bpm  One 4 beat episode of NSVT  Patient triggered events corresponded to sinus rhythm with ventricular ectopy, incluging bigeminy   4 days of data recorded on Zio monitor. Patient had a min HR of 46 bpm, max HR of 207 bpm, and avg HR of 77 bpm. Predominant underlying rhythm was Sinus Rhythm. No atrial fibrillation, high degree block, or pauses noted. There was one 4 beat run of NSVT and 120 runs of SVT, longest lasting 59 seconds at 152 bpm.  Isolated atrial was ectopy was rare (<1%).  Very frequent ventricular ectopy (23% of beats).  Episodes of bigeminy (longest lasting 132 seconds) and trigeminy (longest lasting 98 seconds).  There were 2 triggered events, corresponding to sinus rhythm with ventricular bigeminy.   No significant arrhythmias  detected.  CTA chest 08/16/19: Aneurysmal dilatation of the ascending thoracic aorta up to 47 mm. Recommend semi-annual imaging followup by CTA or MRA and referral to cardiothoracic surgery if not already obtained. This recommendation follows 2010 ACCF/AHA/AATS/ACR/ASA/SCA/SCAI/SIR/STS/SVM Guidelines for the Diagnosis and Management of Patients With Thoracic Aortic Disease. Circulation. 2010; 121: Z610-R604. Aortic aneurysm NOS (ICD10-I71.9)   Lexiscan Myoview 08/18/2019:  The study is normal.  This is a low risk study.   Normal pharmacologic nuclear stress test with no evidence for prior infarct or ischemia. LVEF not calculated. Frequent PVCs.  TTE 07/28/19:  1. Left ventricular ejection fraction, by visual estimation, is 50 to 55%. The left ventricle has low normal function. There is mildly increased left ventricular hypertrophy.  2. Mildly dilated left ventricular internal cavity size.  3. The left ventricle demonstrates regional wall motion abnormalities. Septal hypokinesis  4. Global right ventricle has normal systolic function.The right ventricular size is normal. No increase in right ventricular wall thickness.  5. Left atrial size was normal.  6. Right atrial size was normal.  7. The mitral valve is normal in structure. No evidence of mitral valve regurgitation.  8. The tricuspid valve is normal in structure. Tricuspid valve regurgitation is not demonstrated.  9. The aortic valve is tricuspid. Aortic valve regurgitation is not visualized. No evidence of aortic valve sclerosis or stenosis. 10. The inferior vena cava is normal in size with greater than 50% respiratory variability, suggesting right atrial pressure of 3 mmHg. 11. Aneurysm of the ascending aorta, measuring 48 mm.  ASSESSMENT:    1. Essential hypertension   2. PVC's (premature ventricular contractions)   3. CAD in native artery   4. Thoracic aortic aneurysm without rupture (HCC)   5. OSA (obstructive sleep  apnea)   6. Hyperlipidemia, unspecified hyperlipidemia type    PLAN:     Hypertension: Previously was on amlodipine 5 mg daily, carvedilol 25  mg twice daily, spironolactone 25 mg daily, and azilsartan-chlorthalidone 40-25 mg.  Given aortic aneurysm, recommend goal SBP less than 120.  Has improved significantly since starting CPAP and with weight loss, have been able to cut back on regimen -At last clinic visit, reported BP has been down to SBP 70s to 80s, and he has been having some dizziness.  He had stopped his spironolactone and we changed his losartan-chlorthalidone 40-25 mg to losartan 100 mg daily.  Now with elevated BP.  He has restarted his azilsartan chlorthalidone 40-25 mg and spironolactone 12.5 mg daily.  BP remains elevated, will increase spironolactone to 25 mg daily.  Will check BMP.  Asked patient to monitor BP twice daily and call with results in 1 week.  Will schedule in pharmacy hypertension clinic in 2 weeks for further evaluation  PVCs:  frequent PVCs (23% of beats).  Symptomatic episodes on monitor appear to correspond to episodes of bigeminy.   TTE shows low normal systolic function (EF ~50%), septal hypokinesis.  No evidence of ischemia on Myoview.  Seen by EP, Dr Ladona Ridgel recommended titrating up coreg, with repeat monitor in 11/11/2019 showed significant improvement in PVC burden (5.3% of beats). -Significantly improved with carvedilol, will continue carvedilol   Aortic aneurysm: 4.7 cm ascending aortic aneurysm on CTA chest 12/18.  Repeat CTA chest 12/09/19 showed stable aneurysm, measured 4.5 cm.  Follows with Dr. Vickey Sages in cardiothoracic surgery, 75-month follow-up  CT on 06/11/2020 showed stable aneurysm at 4.7 cm.  CAD: Reports atypical chest pain.  Given frequent PVCs and hypokinesis seen on TTE, Lexiscan Myoview was done on 08/18/19, which showed no evidence of ischemia.  Coronary CTA on 12/08/2019 showed minimal nonobstructive CAD in mid LAD, calcium score 19 (84th  percentile). -Continue rosuvastatin 10 mg daily.  LDL at goal less than 70  Hyperlipidemia: LDL 113 on 01/31/2020.  Started rosuvastatin 10 mg daily, LDL 65 on 06/05/2020.  OSA: Reports compliance with CPAP  T2DM: reports started on Ozempic by PCP, A1c 6.6%.     RTC in 2 months   Medication Adjustments/Labs and Tests Ordered: Current medicines are reviewed at length with the patient today.  Concerns regarding medicines are outlined above.  Orders Placed This Encounter  Procedures  . Comprehensive metabolic panel  . CBC  . EKG 12-Lead   Meds ordered this encounter  Medications  . Azilsartan-Chlorthalidone 40-25 MG TABS    Sig: Take 1 tablet by mouth daily.    Dispense:  90 tablet    Refill:  3  . spironolactone (ALDACTONE) 25 MG tablet    Sig: Take 1 tablet (25 mg total) by mouth daily.    Dispense:  90 tablet    Refill:  3    Patient Instructions  Medication Instructions:  STOP Losartan RESTART azilsartan/chlorthalidone 40mg /25mg  daily RESTART spironolactone 25 mg daily  *If you need a refill on your cardiac medications before your next appointment, please call your pharmacy*   Lab Work: CMET, CBC today  If you have labs (blood work) drawn today and your tests are completely normal, you will receive your results only by: MyChart Message (if you have MyChart) OR . A paper copy in the mail If you have any lab test that is abnormal or we need to change your treatment, we will call you to review the results.  Follow-Up: At [facility], you and your health needs are our priority.  As part of our continuing mission to provide you with exceptional heart care, we have  created designated Provider Care Teams.  These Care Teams include your primary Cardiologist (physician) and Advanced Practice Providers (APPs -  Physician Assistants and Nurse Practitioners) who all work together to provide you with the care you need, when you need it.  We recommend signing up for the  patient portal called "MyChart".  Sign up information is provided on this After Visit Summary.  MyChart is used to connect with patients for Virtual Visits (Telemedicine).  Patients are able to view lab/test results, encounter notes, upcoming appointments, etc.  Non-urgent messages can be sent to your provider as well.   To learn more about what you can do with MyChart, go to [email or web address].    Your next appointment:   2 week(s) with pharmacist 2 months with Dr. Bjorn Pippin  .    Signed, Little Ishikawa, MD  07/23/2020 11:44 PM    [facility]

## 2020-07-23 NOTE — Patient Instructions (Addendum)
Medication Instructions:  STOP Losartan RESTART azilsartan/chlorthalidone 40mg /25mg  daily RESTART spironolactone 25 mg daily  *If you need a refill on your cardiac medications before your next appointment, please call your pharmacy*   Lab Work: CMET, CBC today  If you have labs (blood work) drawn today and your tests are completely normal, you will receive your results only by: MyChart Message (if you have MyChart) OR . A paper copy in the mail If you have any lab test that is abnormal or we need to change your treatment, we will call you to review the results.  Follow-Up: At [facility], you and your health needs are our priority.  As part of our continuing mission to provide you with exceptional heart care, we have created designated Provider Care Teams.  These Care Teams include your primary Cardiologist (physician) and Advanced Practice Providers (APPs -  Physician Assistants and Nurse Practitioners) who all work together to provide you with the care you need, when you need it.  We recommend signing up for the patient portal called "MyChart".  Sign up information is provided on this After Visit Summary.  MyChart is used to connect with patients for Virtual Visits (Telemedicine).  Patients are able to view lab/test results, encounter notes, upcoming appointments, etc.  Non-urgent messages can be sent to your provider as well.   To learn more about what you can do with MyChart, go to [facility].    Your next appointment:   2 week(s) with pharmacist 2 months with Dr. [email or web address]  .

## 2020-07-24 LAB — COMPREHENSIVE METABOLIC PANEL
ALT: 19 IU/L (ref 0–44)
AST: 19 IU/L (ref 0–40)
Albumin/Globulin Ratio: 1.5 (ref 1.2–2.2)
Albumin: 4.2 g/dL (ref 4.0–5.0)
Alkaline Phosphatase: 85 IU/L (ref 44–121)
BUN/Creatinine Ratio: 7 — ABNORMAL LOW (ref 9–20)
BUN: 6 mg/dL (ref 6–24)
Bilirubin Total: 0.4 mg/dL (ref 0.0–1.2)
CO2: 24 mmol/L (ref 20–29)
Calcium: 8.9 mg/dL (ref 8.7–10.2)
Chloride: 101 mmol/L (ref 96–106)
Creatinine, Ser: 0.87 mg/dL (ref 0.76–1.27)
GFR calc Af Amer: 116 mL/min/{1.73_m2} (ref >59)
GFR calc non Af Amer: 101 mL/min/{1.73_m2} (ref >59)
Globulin, Total: 2.8 g/dL (ref 1.5–4.5)
Glucose: 76 mg/dL (ref 65–99)
Potassium: 4.2 mmol/L (ref 3.5–5.2)
Sodium: 138 mmol/L (ref 134–144)
Total Protein: 7 g/dL (ref 6.0–8.5)

## 2020-07-24 LAB — CBC
Hematocrit: 42.8 % (ref 37.5–51.0)
Hemoglobin: 14.1 g/dL (ref 13.0–17.7)
MCH: 28.7 pg (ref 26.6–33.0)
MCHC: 32.9 g/dL (ref 31.5–35.7)
MCV: 87 fL (ref 79–97)
Platelets: 148 10*3/uL — ABNORMAL LOW (ref 150–450)
RBC: 4.91 x10E6/uL (ref 4.14–5.80)
RDW: 12.6 % (ref 11.6–15.4)
WBC: 5.2 10*3/uL (ref 3.4–10.8)

## 2020-07-27 ENCOUNTER — Ambulatory Visit: Payer: Medicare Other | Admitting: Cardiology

## 2020-08-06 NOTE — Progress Notes (Signed)
Patient ID: Ricky [name]                 DOB: [birth year]                      MRN: [id number]     HPI: Ricky [name] is a pleasant 49 y.o. male referred by Ricky [name] to HTN clinic. PMH is significant for PVCs, thoracic aortic aneurysm, OSA, HTN, thrombocytopenia, coronary CTA on 12/08/19 with calcium score of 19 (84th percentile), minimal nonobstructive CAD in mid LAD. BP meds have been adjusted recently by pt due to dizziness (had changed from azilsartan-chlorthalidone to losartan, pt had also stopped spironolactone), then restarted due to elevated BP. At last visit with Dr Bjorn [name] on 12/6, BP was elevated at 160/121mmHg and spironolactone was increased to 25mg  daily.   Pt presents today in good spirits. Has had 2 episodes of low BP in the past 2 weeks since spironolactone dose was increased. Most recent episode was yesterday. Does not think he was as hydrated with water as normal. BP was 109/61 during the day so he didn't take his AM BP meds. BP had increased to 135/79 around 5pm so he took his AM meds late (carvedilol and azilsartan-chlorthalidone), did not take 2nd dose of carvedilol or spironolactone yesterday. BP dropped to 89/52 at 9pm, pt felt sluggish. An hour later, BP was still 86/47. He drank a lot of water and had some saltine chips. BP improved to 102/51 and he went to sleep. BP this AM was 134/60. Otherwise, SBP has trended 110-128 over the past few weeks. Has not noticed any PVCs.    Also received 2nd COVID vaccine with J&J and is worried about blood clots. Wants recheck of his CBC today since his platelets were slightly low on last check.  Current HTN meds:  Azilsartan-chlorthalidone 40-25mg  daily - 10am Carvedilol 25mg  BID - 10am and 10pm Spironolactone 25mg  daily - 10pm  Previously tried: losartan - ineffective  BP goal: <120/36mmHg per Dr d/t aortic aneurysm  Family History: Mother with history of DM, HTN, HLD, stroke, obesity, anxiety. Father with HTN and  HLD.  Social History: Former smoker 1/2 PPD for 6 years, quit in 2013. Denies alcohol and illicit drug use.  Diet: Does not add salt to his food. Has a cup of coffee in the AM. Likes vegetables, avoids sweets.  Exercise: Osteoarthritis in his right knee, Baker cyst behind right knee has limited activity.  Wt Readings from Last 3 Encounters:  07/23/20 (!) 302 lb (137 kg)  07/11/20 297 lb 3.2 oz (134.8 kg)  06/11/20 299 lb (135.6 kg)   BP Readings from Last 3 Encounters:  07/23/20 (!) 160/110  07/11/20 132/76  06/11/20 117/82   Pulse Readings from Last 3 Encounters:  07/23/20 (!) 58  07/11/20 (!) 54  06/11/20 61    Renal function: CrCl cannot be calculated (Unknown ideal weight.).  Past Medical History:  Diagnosis Date  . Anxiety   . Back pain   . 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 Chest pain   . Depression   . High cholesterol   . Hypertension   . Osteoarthritis   . Osteoarthritis   . Overweight   . SOB (shortness of breath)   . Thrombocytopenia (HCC) 07/07/2012   138,000 08/26/11; 98,000 06/29/12    Current Outpatient Medications on File Prior to Visit  Medication Sig Dispense Refill  . Azilsartan-Chlorthalidone 40-25 MG  TABS Take 1 tablet by mouth daily. 90 tablet 3  . Blood Pressure Monitoring (BLOOD PRESSURE CUFF) MISC XL Adult blood pressure cuff 1 each 0  . carvedilol (COREG) 25 MG tablet Take 25 mg by mouth 2 (two) times daily with a meal.    . cyclobenzaprine (FLEXERIL) 10 MG tablet Take 1 tablet (10 mg total) by mouth 2 (two) times daily as needed for muscle spasms. 20 tablet 0  . dicyclomine (BENTYL) 20 MG tablet Take 1 tablet (20 mg total) by mouth 2 (two) times daily between meals as needed for spasms. 20 tablet 0  . omeprazole (PRILOSEC) 20 MG capsule Take 1 capsule (20 mg total) by mouth daily. 30 capsule 1  . rosuvastatin (CRESTOR) 10 MG tablet Take 1 tablet (10 mg total) by mouth daily. 90 tablet 3  .  sildenafil (VIAGRA) 100 MG tablet TAKE 1/2 TO 1 TABLET BY MOUTH AS NEEDED 10 tablet 1  . spironolactone (ALDACTONE) 25 MG tablet Take 1 tablet (25 mg total) by mouth daily. 90 tablet 3   No current facility-administered medications on file prior to visit.    No Known Allergies   Assessment/Plan:  1. Hypertension - BP at goal <120/67mmHg during today's visit and with most home readings. Since pt has had a few episodes of hypotension to the 80s/40s over the past two weeks, will decrease spironolactone back to 12.5mg  daily. Continue azilsartan-chlorthalidone 40-25mg  daily and carvedilol 25mg  BID. Pt encouraged to stay hydrated with water. Discussed that spironolactone dose can easily be skipped or adjusted based on his BP readings. Currently, he checks BP before taking meds and then determines which meds to take. This can be helpful in cases where he needs to skip med doses due to hypotension, but has made BP trends slightly harder to track due to delayed/skipped BP meds. Also discussed checking BP readings a few hours after his medication doses so we can better assess dosing efficacy. Checking BMET today with recent spironolactone dose change as well as CBC per pt request. Will follow up with pt in 2 weeks per pt preference as he wishes to keep a close eye on his BP management.  [name] E. Adrianne Shackleton, PharmD, BCACP, CPP  [facility] [address], [place], [place] Phone: [phone]; Fax: [phone] 08/07/2020 9:53 AM

## 2020-08-07 ENCOUNTER — Telehealth: Payer: Self-pay | Admitting: Pharmacist

## 2020-08-07 ENCOUNTER — Ambulatory Visit (INDEPENDENT_AMBULATORY_CARE_PROVIDER_SITE_OTHER): Payer: Medicare Other | Admitting: Pharmacist

## 2020-08-07 ENCOUNTER — Other Ambulatory Visit: Payer: Self-pay

## 2020-08-07 VITALS — BP 122/82 | HR 59

## 2020-08-07 DIAGNOSIS — I10 Essential (primary) hypertension: Secondary | ICD-10-CM

## 2020-08-07 LAB — BASIC METABOLIC PANEL
BUN/Creatinine Ratio: 21 — ABNORMAL HIGH (ref 9–20)
BUN: 19 mg/dL (ref 6–24)
CO2: 25 mmol/L (ref 20–29)
Calcium: 9.1 mg/dL (ref 8.7–10.2)
Chloride: 99 mmol/L (ref 96–106)
Creatinine, Ser: 0.91 mg/dL (ref 0.76–1.27)
GFR calc Af Amer: 113 mL/min/{1.73_m2} (ref >59)
GFR calc non Af Amer: 98 mL/min/{1.73_m2} (ref >59)
Glucose: 123 mg/dL — ABNORMAL HIGH (ref 65–99)
Potassium: 3.9 mmol/L (ref 3.5–5.2)
Sodium: 138 mmol/L (ref 134–144)

## 2020-08-07 LAB — CBC
Hematocrit: 46.1 % (ref 37.5–51.0)
Hemoglobin: 14.8 g/dL (ref 13.0–17.7)
MCH: 28.5 pg (ref 26.6–33.0)
MCHC: 32.1 g/dL (ref 31.5–35.7)
MCV: 89 fL (ref 79–97)
Platelets: 149 10*3/uL — ABNORMAL LOW (ref 150–450)
RBC: 5.19 x10E6/uL (ref 4.14–5.80)
RDW: 12.8 % (ref 11.6–15.4)
WBC: 5.3 10*3/uL (ref 3.4–10.8)

## 2020-08-07 NOTE — Telephone Encounter (Signed)
Pt called clinic. States he took his azilsartan-chlorthalidone and carvedilol when he got home from visit today at around 10am, BP subsequently dropped to 89/55 around 2pm when he was out eating a salad with his girlfriend. He ate some salty food and BP increased to 109/56. Has not had spironolactone yesterday and today.   Advised pt to cut his azilsartan-chlorthalidone tablet in half starting with tomorrow's dose, and to hold off on taking his spironolactone. He will continue on carvedilol. Advised pt he can call clinic with updated BP readings in the next few days.

## 2020-08-07 NOTE — Patient Instructions (Addendum)
Continue taking azilsartan-chlorthalidone once daily at 10am  Continue carvedilol 25mg  twice daily at 10am and 10pm  You can decrease your spironolactone to 1/2 tablet (12.5mg ) daily  Stay hydrated with water  Your blood pressure goal is < 120/4mmHg. Continue to monitor your blood pressure   If your blood pressure is < 100/27mmHg, hold off on your medications and re-evaluate in a few hours  Call the Pharmacist with any concerns #[phone]  Follow up in 2 weeks in clinic

## 2020-08-16 ENCOUNTER — Ambulatory Visit: Payer: Medicare Other | Admitting: Cardiology

## 2020-08-21 ENCOUNTER — Ambulatory Visit (INDEPENDENT_AMBULATORY_CARE_PROVIDER_SITE_OTHER): Payer: Medicare Other | Admitting: Pharmacist

## 2020-08-21 ENCOUNTER — Other Ambulatory Visit: Payer: Self-pay

## 2020-08-21 VITALS — BP 112/78 | HR 58

## 2020-08-21 DIAGNOSIS — I10 Essential (primary) hypertension: Secondary | ICD-10-CM

## 2020-08-21 MED ORDER — AZILSARTAN-CHLORTHALIDONE 40-25 MG PO TABS
0.5000 | ORAL_TABLET | Freq: Every day | ORAL | 3 refills | Status: DC
Start: 2020-08-21 — End: 2020-11-01

## 2020-08-21 MED ORDER — CARVEDILOL 12.5 MG PO TABS
18.7500 mg | ORAL_TABLET | Freq: Two times a day (BID) | ORAL | 11 refills | Status: DC
Start: 2020-08-21 — End: 2020-09-25

## 2020-08-21 NOTE — Progress Notes (Signed)
Patient ID: Ricky [name]                 DOB: [birth year]                      MRN: [id number]     HPI: Ricky [name] is a pleasant 50 y.o. male referred by Dr. Bjorn [name] to HTN clinic. PMH is significant for PVCs, thoracic aortic aneurysm, OSA, HTN, thrombocytopenia, coronary CTA on 12/08/19 with calcium score of 19 (84th percentile), minimal nonobstructive CAD in mid LAD. BP meds have been adjusted recently by pt due to dizziness (had changed from azilsartan-chlorthalidone to losartan, pt had also stopped spironolactone), then restarted due to elevated BP. At last visit with Dr Ricky [name] on 12/6, BP was elevated at 160/134mmHg and spironolactone was increased to 25mg  daily. At follow up visit with me on 12/21, BP was well controlled but pt reported occasional episodes of hypotension to the 80s/40s at home, so his spironolactone dose was reduced back to 12.5mg . He called clinic later that day when BP dropped to 89/55 after taking his scheduled doses of azilsartan-chlorthalidone and carvedilol. He had not taken spironolactone in 2 days. Advised pt to start cutting his azilsartan-chlorthalidone in half, hold his spironolactone and keep a close eye on his readings.  Pt presents today in good spirits. Reports feeling much better, has not been experiencing any low BP readings since cutting his azilsartan-chlorthalidone in half and stopping his spironolactone. He has been staying hydrated, denies dizziness. He checks his BP at home twice daily before taking his meds (he previously would skip med doses if BP was running low). He brings his home cuff today which is measuring higher than clinic reading.  Home cuff: 147/75, then 132/80 and 124/84 Clinic cuff: 104/78, then 112/78  Current HTN meds:  Azilsartan-chlorthalidone 40-25mg , currently taking 1/2 tab daily - 10am Carvedilol 25mg  BID - 10am and 10pm Spironolactone 25mg  daily - 10pm (currently holding)  Previously tried: losartan - ineffective  BP  goal: <120/62mmHg per Dr d/t aortic aneurysm  Family History: Mother with history of DM, HTN, HLD, stroke, obesity, anxiety. Father with HTN and HLD.  Social History: Former smoker 1/2 PPD for 6 years, quit in 2013. Denies alcohol and illicit drug use.  Diet: Does not add salt to his food. Has a cup of coffee in the AM. Likes vegetables, avoids sweets.  Exercise: Osteoarthritis in his right knee, Baker cyst behind right knee has limited activity.  BP readings:  AM (before meds): 133/63,125/84, 124/60, 116/66, 123/70, 118/67, 134/70, 126/73, 123/70, 128/66, 118/65, BP47-68 PM (before meds): 128/66, 117/71, 139/66, 120/69, 140/80, 103/74, 134/77, 141/62, 137/68, 124/60, 140/71, HR 45 - 62  Wt Readings from Last 3 Encounters:  07/23/20 (!) 302 lb (137 kg)  07/11/20 297 lb 3.2 oz (134.8 kg)  06/11/20 299 lb (135.6 kg)   BP Readings from Last 3 Encounters:  08/07/20 122/82  07/23/20 (!) 160/110  07/11/20 132/76   Pulse Readings from Last 3 Encounters:  08/07/20 (!) 59  07/23/20 (!) 58  07/11/20 (!) 54    Renal function: CrCl cannot be calculated (Unknown ideal weight.).  Past Medical History:  Diagnosis Date  . Anxiety   . Back pain   . 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 Chest pain   . Depression   . High cholesterol   . Hypertension   . Osteoarthritis   . Osteoarthritis   . Overweight   .  SOB (shortness of breath)   . Thrombocytopenia (HCC) 07/07/2012   138,000 08/26/11; 98,000 06/29/12    Current Outpatient Medications on File Prior to Visit  Medication Sig Dispense Refill  . Azilsartan-Chlorthalidone 40-25 MG TABS Take 0.5 tablets by mouth daily. 90 tablet 3  . Blood Pressure Monitoring (BLOOD PRESSURE CUFF) MISC XL Adult blood pressure cuff 1 each 0  . carvedilol (COREG) 25 MG tablet Take 25 mg by mouth 2 (two) times daily with a meal.    . cyclobenzaprine (FLEXERIL) 10 MG tablet Take 1 tablet (10 mg total) by  mouth 2 (two) times daily as needed for muscle spasms. 20 tablet 0  . dicyclomine (BENTYL) 20 MG tablet Take 1 tablet (20 mg total) by mouth 2 (two) times daily between meals as needed for spasms. 20 tablet 0  . omeprazole (PRILOSEC) 20 MG capsule Take 1 capsule (20 mg total) by mouth daily. 30 capsule 1  . rosuvastatin (CRESTOR) 10 MG tablet Take 1 tablet (10 mg total) by mouth daily. 90 tablet 3  . sildenafil (VIAGRA) 100 MG tablet TAKE 1/2 TO 1 TABLET BY MOUTH AS NEEDED 10 tablet 1   No current facility-administered medications on file prior to visit.    No Known Allergies  Assessment/Plan:  1. Hypertension - BP at goal <120/54mmHg during today's visit and with almost all home readings. BP readings in the evening are slightly elevated, however pt's BP cuff measured 10-62mmHg higher systolic than manual reading in clinic, confirmed multiple times. Encouraged pt to look for large size Omron bicep cuff since his current cuff is overestimating his BP readings. Pt has not experienced any episodes of hypotension since cutting his azilsartan-chlorthalidone in half. Will continue lower dose of azilsartan-chlorthalidone 40-25mg  1/2 tablet daily (not available in combo pill at lower dose). Will decrease carvedilol from 25mg  BID to 18.75mg  BID since his HR has been in the 40s frequently at home. Pt aware to continue holding his spironolactone since BP has been controlled without it. Did advise pt to check BP around lunch time for readings that will better show the efficacy of his BP meds since he's currently monitoring BP before taking meds. Will follow up with pt in 3 weeks per pt preference as he wishes to keep a close eye on his BP management; f/u with Dr in 6 weeks.  Ricky [name] E. Ricky [name], PharmD, BCACP, CPP [facility] [address] [place], [place] Phone: [phone]; Fax: [phone] 08/21/2020 7:48 AM

## 2020-08-21 NOTE — Patient Instructions (Addendum)
It was nice to see you today  Decrease your carvedilol to 18.75mg  twice daily - this is 1.5 tablets of the 12.5mg  dose. We want your heart rate to be mid 50s rather than in the 40s  Continue taking azilsartan-chlorthalidone 1/2 tablet once daily  Continue holding your spironolactone   Look for a new large size bicep Omron blood pressure cuff  Continue to monitor your blood pressure at home, your goal is < 130/38mmHg  Keep your follow up appt with Dr Bjorn Pippin on 2/14 at 3:20pm

## 2020-09-10 NOTE — Progress Notes (Unsigned)
Patient ID: Ricky [name]                 DOB: February 06, 1971                      MRN: [id number]     HPI: Ricky [name] is a pleasant 50 y.o. male referred by Dr. Bjorn Pippin to HTN clinic. PMH is significant for PVCs, thoracic aortic aneurysm, OSA, HTN, thrombocytopenia, coronary CTA on 12/08/19 with calcium score of 19 (84th percentile), minimal nonobstructive CAD in mid LAD. BP meds have been adjusted recently by pt due to dizziness (had changed from azilsartan-chlorthalidone to losartan, pt had also stopped spironolactone), then restarted due to elevated BP. At last visit with Dr Bjorn Pippin on 12/6, BP was elevated at 160/123mmHg and spironolactone was increased to 25mg  daily. At f/u visit with pharmacy clinic on 12/21, BP was well controlled but pt reported occasional episodes of hypotension to the 80s/40s at home, so his spironolactone dose was reduced back to 12.5mg . He called clinic later that day when BP dropped to 89/55 after taking his scheduled doses of azilsartan-chlorthalidone and carvedilol. He had not taken spironolactone in 2 days. Advised pt to start cutting his azilsartan-chlorthalidone in half, hold his spironolactone and keep a close eye on his readings.  Pt was last seen in clinic on 08/21/2020. He reported feeling much better with less low BP readings since cutting the azilsartan-chlorthalidone in half and stopping spironolactone. BP during clinic visit 112/78 at goal of < 120/80 mmHg. He was continued on azilsartan-chlorthalidone 40-25 mg: 1/2 tablet daily. Carvedilol 25 mg BID was decreased to 18.75 mg BID secondary to HR in the 40's. Spironolactone was discontinued.  Pt BP readings - any lows or highs? Concerns with dec carvedilol? HR?  Tolerating medications well?  If needing to back off, could dec carvedilol to 12.5 mg BID  Current HTN meds:  Azilsartan-chlorthalidone 40-25mg , currently taking 1/2 tab daily - 10am Carvedilol 18.75mg  BID - 10am and 10pm  Previously tried:   losartan - ineffective Spironolactone 25mg  daily - pt reached goal without need for drug, d/c d/t hypotension  BP goal: <120/6mmHg per Dr d/t aortic aneurysm  Family History: Mother with history of DM, HTN, HLD, stroke, obesity, anxiety. Father with HTN and HLD.  Social History: Former smoker 1/2 PPD for 6 years, quit in 2013. Denies alcohol and illicit drug use.  Diet: Does not add salt to his food. Has a cup of coffee in the AM. Likes vegetables, avoids sweets.  Exercise: Osteoarthritis in his right knee, Baker cyst behind right knee has limited activity.  BP readings:    Wt Readings from Last 3 Encounters:  07/23/20 (!) 302 lb (137 kg)  07/11/20 297 lb 3.2 oz (134.8 kg)  06/11/20 299 lb (135.6 kg)   BP Readings from Last 3 Encounters:  08/21/20 112/78  08/07/20 122/82  07/23/20 (!) 160/110   Pulse Readings from Last 3 Encounters:  08/21/20 (!) 58  08/07/20 (!) 59  07/23/20 (!) 58    Renal function: CrCl cannot be calculated (Patient's most recent lab result is older than the maximum 21 days allowed.).  Past Medical History:  Diagnosis Date  . Anxiety   . Back pain   . 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 Chest pain   . Depression   . High cholesterol   . Hypertension   . Osteoarthritis   . Osteoarthritis   .  Overweight   . SOB (shortness of breath)   . Thrombocytopenia (HCC) 07/07/2012   138,000 08/26/11; 98,000 06/29/12    Current Outpatient Medications on File Prior to Visit  Medication Sig Dispense Refill  . Azilsartan-Chlorthalidone 40-25 MG TABS Take 0.5 tablets by mouth daily. 45 tablet 3  . Blood Pressure Monitoring (BLOOD PRESSURE CUFF) MISC XL Adult blood pressure cuff 1 each 0  . carvedilol (COREG) 12.5 MG tablet Take 1.5 tablets (18.75 mg total) by mouth 2 (two) times daily with a meal. 90 tablet 11  . cyclobenzaprine (FLEXERIL) 10 MG tablet Take 1 tablet (10 mg total) by mouth 2 (two) times  daily as needed for muscle spasms. 20 tablet 0  . dicyclomine (BENTYL) 20 MG tablet Take 1 tablet (20 mg total) by mouth 2 (two) times daily between meals as needed for spasms. 20 tablet 0  . omeprazole (PRILOSEC) 20 MG capsule Take 1 capsule (20 mg total) by mouth daily. 30 capsule 1  . rosuvastatin (CRESTOR) 10 MG tablet Take 1 tablet (10 mg total) by mouth daily. 90 tablet 3  . sildenafil (VIAGRA) 100 MG tablet TAKE 1/2 TO 1 TABLET BY MOUTH AS NEEDED 10 tablet 1   No current facility-administered medications on file prior to visit.    No Known Allergies  Assessment/Plan:  1. Hypertension -

## 2020-09-11 ENCOUNTER — Ambulatory Visit: Payer: Medicare Other

## 2020-09-25 ENCOUNTER — Encounter: Payer: Self-pay | Admitting: Pharmacist

## 2020-09-25 ENCOUNTER — Other Ambulatory Visit: Payer: Self-pay

## 2020-09-25 ENCOUNTER — Ambulatory Visit (INDEPENDENT_AMBULATORY_CARE_PROVIDER_SITE_OTHER): Payer: Medicare Other | Admitting: Pharmacist

## 2020-09-25 VITALS — BP 106/82 | HR 61

## 2020-09-25 DIAGNOSIS — Z131 Encounter for screening for diabetes mellitus: Secondary | ICD-10-CM | POA: Diagnosis not present

## 2020-09-25 DIAGNOSIS — I10 Essential (primary) hypertension: Secondary | ICD-10-CM | POA: Diagnosis not present

## 2020-09-25 MED ORDER — INSULIN PEN NEEDLE 32G X 4 MM MISC: 1 refills | Status: DC

## 2020-09-25 MED ORDER — CARVEDILOL 12.5 MG PO TABS: 18.7500 mg | ORAL_TABLET | Freq: Two times a day (BID) | ORAL | 11 refills | Status: DC

## 2020-09-25 NOTE — Patient Instructions (Addendum)
It was nice meeting you!  We would like your blood pressure to be less than 120/80  Continue your azilsartan/chlorthalidone 40/25 once a day in the morning  Reduce your dose of carvedilol back to 18.75mg  twice a day  Take your spironolactone if your blood pressure elevates again, otherwise, continue to hold  Please call with any questions  Laural Golden, PharmD, BCACP, CDCES, CPP [facility] [address] [place], [place] [address] Phone: [phone]; Fax: [phone] 09/25/2020 10:35 AM

## 2020-09-25 NOTE — Progress Notes (Signed)
Patient ID: Ricky [name]                 DOB: [birth year]                      MRN: [id number]     HPI: Ricky [name] is a 50 y.o. male referred by Dr. Bjorn Pippin to HTN clinic. PMH is significant for PVCs, thoracic aortic aneurysm, OSA, HTN, thrombocytopenia, coronary CTA on 12/08/19 with calcium score of 19 (84th percentile), minimal nonobstructive CAD in mid LAD. BP meds have been adjusted recently by pt due to dizziness (had changed from azilsartan-chlorthalidone to losartan, pt had also stopped spironolactone), then restarted due to elevated BP. At last visit with Dr Bjorn Pippin on 12/6, BP was elevated at 160/198mmHg and spironolactone was increased to 25mg  daily. At follow up visit with Dr on 12/21, BP was well controlled but pt reported occasional episodes of hypotension to the 80s/40s at home, so his spironolactone dose was reduced back to 12.5mg . He called clinic later that day when BP dropped to 89/55 after taking his scheduled doses of azilsartan-chlorthalidone and carvedilol. He had not taken spironolactone in 2 days. Advised pt to start cutting his azilsartan-chlorthalidone in half, hold his spironolactone and keep a close eye on his readings.  Patient presents today with his blood pressure log.  Lives with his son who caught Covid 84 on 1/12.  Patient was not symptomatic but went and got tested on 1/16.  Found out on 1/17 he was positive.  Blood pressure became variable after that, increasing to 130s/140s and as high as 174.  Self increased his Edarbiclor to full strength 40-25 and increased his carvedilol back to 25mg  BID.  Took 2 doses of spironolactone.    Patient's PCP sent prescription for ZPack and prednisone, however patient did not take.    Blood pressure has since begun to trend down, however patient's pulse has as well.  Recent BP readings  2/8: 118/67  57 2/7: 134/70  47, 123/70  66 2/6: 140/80  52, 120/69  54 2/5: 116/68  52, 124/60  50 2/4: 131/66  56, 139/66   53  Current HTN meds: Azilsartan-chlorthalidone 40-25mg , Carvedilol 25mg  BID Spironolactone 25mg  daily - 10pm (currently holding) Previously tried: losartan 100mg   BP goal: <120/80   Wt Readings from Last 3 Encounters:  07/23/20 (!) 302 lb (137 kg)  07/11/20 297 lb 3.2 oz (134.8 kg)  06/11/20 299 lb (135.6 kg)   BP Readings from Last 3 Encounters:  08/21/20 112/78  08/07/20 122/82  07/23/20 (!) 160/110   Pulse Readings from Last 3 Encounters:  08/21/20 (!) 58  08/07/20 (!) 59  07/23/20 (!) 58    Renal function: CrCl cannot be calculated (Patient's most recent lab result is older than the maximum 21 days allowed.).  Past Medical History:  Diagnosis Date  . Anxiety   . Back pain   . 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 Chest pain   . Depression   . High cholesterol   . Hypertension   . Osteoarthritis   . Osteoarthritis   . Overweight   . SOB (shortness of breath)   . Thrombocytopenia (HCC) 07/07/2012   138,000 08/26/11; 98,000 06/29/12    Current Outpatient Medications on File Prior to Visit  Medication Sig Dispense Refill  . Azilsartan-Chlorthalidone 40-25 MG TABS Take 0.5 tablets by mouth daily. 45 tablet 3  . Blood Pressure Monitoring (  BLOOD PRESSURE CUFF) MISC XL Adult blood pressure cuff 1 each 0  . carvedilol (COREG) 12.5 MG tablet Take 1.5 tablets (18.75 mg total) by mouth 2 (two) times daily with a meal. 90 tablet 11  . cyclobenzaprine (FLEXERIL) 10 MG tablet Take 1 tablet (10 mg total) by mouth 2 (two) times daily as needed for muscle spasms. 20 tablet 0  . dicyclomine (BENTYL) 20 MG tablet Take 1 tablet (20 mg total) by mouth 2 (two) times daily between meals as needed for spasms. 20 tablet 0  . omeprazole (PRILOSEC) 20 MG capsule Take 1 capsule (20 mg total) by mouth daily. 30 capsule 1  . rosuvastatin (CRESTOR) 10 MG tablet Take 1 tablet (10 mg total) by mouth daily. 90 tablet 3  . sildenafil (VIAGRA) 100 MG  tablet TAKE 1/2 TO 1 TABLET BY MOUTH AS NEEDED 10 tablet 1   No current facility-administered medications on file prior to visit.    No Known Allergies   Assessment/Plan:  1. Hypertension - Patient BP in room 122/90 (checked manually) which is above goal of <120/80.  Since diastolic not typically that high, waited a few minutes and retested with machine: 110/80.  Checked again manually at end of appointment: 106/82.  Patient is at borderline goal of <120/80.  Concern regarding patient's pulse dropping into 40s and 50s, likely due to increasing carvedilol back to 25mg  BID.  Recommended decreasing back to 18.75 mg BID, however will continue azilsartan/chlorthalidone 40-25 at full strength since diastolic pressures occasionally above goal.  Recommended patient continue to self monitor at home and if BP begins to fall, will reduce dose again to 1/2 tablet.  Continue holding spironolactone.  Patient also takes Ozempic but he reports he only takes PRN.  Educated patient on proper dosing schedule of Ozempic and its CV benefits.  Reports he has not taken it in ~2 weeks because he ran out of pen needles.  Will send refill for him.    Recheck in 1 month  Reduce carvedilol back to 18.75mg  BID Continue Edarbiclor 40-25 once daily Recheck in 1 month  , PharmD, BCACP, CDCES, CPP [facility] [address] [place], [place] Phone: [phone]; Fax: [phone] 09/25/2020 11:30 AM

## 2020-09-30 NOTE — Progress Notes (Signed)
Cardiology Office Note:    Date:  10/01/2020   ID:  Ricky SoxJames L Coffie, DOB 1971/01/30, MRN [id number]  PCP:  Maryagnes AmosStarkes-Perry, Takia S, FNP  Cardiologist:  Little Ishikawahristopher L Savaughn Karwowski, MD  Electrophysiologist:  None   Referring MD: Maryagnes AmosStarkes-Perry, Takia S,*   Chief Complaint  Patient presents with  . Hypertension    History of Present Illness:    Ricky [name] is a 50 y.o. male with a hx of frequent PVCs, thoracic aortic aneurysm, OSA hypertension, thrombocytopenia who presents for follow-up.  He was initially seen on 07/22/2019 after he was referred by Caryn Beeakia Starkes-Perry, FNP for an evaluation of PVCs.  Had an ED visit on 07/10/2019 with chest pain/body aches and lightheadedness.  EKG showed bigeminy. TTE was done on 07/28/2019 which showed low normal systolic function (EF 50 to 55%), septal hypokinesis.  Also was notable for ascending aortic aneurysm measuring up to 48 mm.  Underwent CTA chest on 08/16/2019, which showed ascending aortic aneurysm measuring up to 47mm.  Cardiac monitor showed frequent PVCs (23% of beats) and 120 episodes of SVT, lasting up to 1 minute.  Lexiscan Myoview on 08/18/2019 showed no evidence of ischemia.  Diagnosed with OSA.  He was referred to Dr. Ladona Ridgelaylor in EP for evaluation of his PVCs, recommended uptitrating his beta-blocker as initial treatment and repeat monitor once on Coreg 25 mg twice daily.  Repeat monitor in 11/11/2019 showed significant improvement in PVC burden (5.3% of beats).  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  Since last clinic visit, reports he tested positive for Covid last month.  States that he had an exposure but was asymptomatic.  He reports labile blood pressure since having Covid.  Reports recently BP has been 110s to 130s over 70s to 90s.  Denies any lightheadedness or syncope.  Does report had a brief episode of chest pain that lasted for about 1 second but resolved.  Denies any  dyspnea, lower extremity edema, or palpitations.   BP Readings from Last 3 Encounters:  10/01/20 (!) 130/95  09/25/20 106/82  08/21/20 112/78    Wt Readings from Last 3 Encounters:  10/01/20 (!) 305 lb (138.3 kg)  07/23/20 (!) 302 lb (137 kg)  07/11/20 297 lb 3.2 oz (134.8 kg)      Past Medical History:  Diagnosis Date  . Anxiety   . Back pain   . 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 Chest pain   . Depression   . High cholesterol   . Hypertension   . Osteoarthritis   . Osteoarthritis   . Overweight   . SOB (shortness of breath)   . Thrombocytopenia (HCC) 07/07/2012   138,000 08/26/11; 98,000 06/29/12    Past Surgical History:  Procedure Laterality Date  . ADENOIDECTOMY    . ANTERIOR FUSION CERVICAL SPINE    . APPENDECTOMY    . HERNIA REPAIR    . IRRIGATION AND DEBRIDEMENT SEBACEOUS CYST     on scalp  . KNEE ARTHROSCOPY     x2  . LUMBAR FUSION    . POSTERIOR FUSION CERVICAL SPINE    . TONSILLECTOMY      Current Medications: Current Meds  Medication Sig  . Azilsartan-Chlorthalidone 40-25 MG TABS Take 0.5 tablets by mouth daily. (Patient taking differently: Take 1 tablet by mouth daily.)  . Blood Pressure Monitoring (BLOOD PRESSURE CUFF) MISC XL Adult blood pressure cuff  . cyclobenzaprine (  FLEXERIL) 10 MG tablet Take 1 tablet (10 mg total) by mouth 2 (two) times daily as needed for muscle spasms.  Marland Kitchen dicyclomine (BENTYL) 20 MG tablet Take 1 tablet (20 mg total) by mouth 2 (two) times daily between meals as needed for spasms.  . Insulin Pen Needle 32G X 4 MM MISC Use to inject Ozempic once weekly  . omeprazole (PRILOSEC) 20 MG capsule Take 1 capsule (20 mg total) by mouth daily.  . Semaglutide,0.25 or 0.5MG /DOS, (OZEMPIC, 0.25 OR 0.5 MG/DOSE,) 2 MG/1.5ML SOPN Inject 0.5 mg into the skin once a week.  . sildenafil (VIAGRA) 100 MG tablet TAKE 1/2 TO 1 TABLET BY MOUTH AS NEEDED  . [DISCONTINUED] carvedilol (COREG) 12.5 MG  tablet Take 1.5 tablets (18.75 mg total) by mouth 2 (two) times daily with a meal.     Allergies:   Patient has no known allergies.   Social History   Socioeconomic History  . Marital status: Single    Spouse name: Not on file  . Number of children: Not on file  . Years of education: Not on file  . Highest education level: Not on file  Occupational History  . Occupation: Disabled  Tobacco Use  . Smoking status: Former Smoker    Packs/day: 0.50    Years: 6.00    Pack years: 3.00    Types: Cigarettes    Quit date: 2013    Years since quitting: 9.1  . Smokeless tobacco: Never Used  Substance and Sexual Activity  . Alcohol use: No  . Drug use: No  . Sexual activity: Not on file  Other Topics Concern  . Not on file  Social History Narrative  . Not on file   Social Determinants of Health   Financial Resource Strain: Not on file  Food Insecurity: Not on file  Transportation Needs: Not on file  Physical Activity: Not on file  Stress: Not on file  Social Connections: Not on file     Family History: No family history of heart disease  ROS:   Please see the history of present illness.    All other systems reviewed and are negative.  EKGs/Labs/Other Studies Reviewed:    The following studies were reviewed today:  EKG:  EKG is ordered today.  The ekg ordered today demonstrates sinus rhythm, rate 93, PVCs  Recent Labs: 04/05/2020: TSH 1.010 06/05/2020: Magnesium 2.0 07/23/2020: ALT 19 08/07/2020: BUN 19; Creatinine, Ser 0.91; Hemoglobin 14.8; Platelets 149; Potassium 3.9; Sodium 138  Recent Lipid Panel    Component Value Date/Time   CHOL 126 06/05/2020 1022   TRIG 146 06/05/2020 1022   HDL 35 (L) 06/05/2020 1022   CHOLHDL 3.6 06/05/2020 1022   LDLCALC 65 06/05/2020 1022    Physical Exam:    VS:  BP (!) 130/95   Pulse 93   Ht 6\' 1"  (1.854 m)   Wt (!) 305 lb (138.3 kg)   SpO2 95%   BMI 40.24 kg/m     Wt Readings from Last 3 Encounters:  10/01/20 (!) 305  lb (138.3 kg)  07/23/20 (!) 302 lb (137 kg)  07/11/20 297 lb 3.2 oz (134.8 kg)     GEN:  Well nourished, well developed in no acute distress HEENT: Normal NECK: No JVD LYMPHATICS: No lymphadenopathy CARDIAC: RRR, no murmurs, rubs, gallops RESPIRATORY:  Clear to auscultation without rales, wheezing or rhonchi  ABDOMEN: Soft, non-tender, non-distended MUSCULOSKELETAL:  No edema; No deformity  SKIN: Warm and dry NEUROLOGIC:  Alert and oriented  x 3 PSYCHIATRIC:  Normal affect    Cardiac monitor 08/21/19:  Frequent ventricular ectopy (23% of beats)  120 episodes of SVT, longest lasting 59 seconds at 152 bpm  One 4 beat episode of NSVT  Patient triggered events corresponded to sinus rhythm with ventricular ectopy, incluging bigeminy   4 days of data recorded on Zio monitor. Patient had a min HR of 46 bpm, max HR of 207 bpm, and avg HR of 77 bpm. Predominant underlying rhythm was Sinus Rhythm. No atrial fibrillation, high degree block, or pauses noted. There was one 4 beat run of NSVT and 120 runs of SVT, longest lasting 59 seconds at 152 bpm.  Isolated atrial was ectopy was rare (<1%).  Very frequent ventricular ectopy (23% of beats).  Episodes of bigeminy (longest lasting 132 seconds) and trigeminy (longest lasting 98 seconds).  There were 2 triggered events, corresponding to sinus rhythm with ventricular bigeminy.   No significant arrhythmias detected.  CTA chest 08/16/19: Aneurysmal dilatation of the ascending thoracic aorta up to 47 mm. Recommend semi-annual imaging followup by CTA or MRA and referral to cardiothoracic surgery if not already obtained. This recommendation follows 2010 ACCF/AHA/AATS/ACR/ASA/SCA/SCAI/SIR/STS/SVM Guidelines for the Diagnosis and Management of Patients With Thoracic Aortic Disease. Circulation. 2010; 121: E938-B017. Aortic aneurysm NOS (ICD10-I71.9)   Lexiscan Myoview 08/18/2019:  The study is normal.  This is a low risk study.   Normal  pharmacologic nuclear stress test with no evidence for prior infarct or ischemia. LVEF not calculated. Frequent PVCs.  TTE 07/28/19:  1. Left ventricular ejection fraction, by visual estimation, is 50 to 55%. The left ventricle has low normal function. There is mildly increased left ventricular hypertrophy.  2. Mildly dilated left ventricular internal cavity size.  3. The left ventricle demonstrates regional wall motion abnormalities. Septal hypokinesis  4. Global right ventricle has normal systolic function.The right ventricular size is normal. No increase in right ventricular wall thickness.  5. Left atrial size was normal.  6. Right atrial size was normal.  7. The mitral valve is normal in structure. No evidence of mitral valve regurgitation.  8. The tricuspid valve is normal in structure. Tricuspid valve regurgitation is not demonstrated.  9. The aortic valve is tricuspid. Aortic valve regurgitation is not visualized. No evidence of aortic valve sclerosis or stenosis. 10. The inferior vena cava is normal in size with greater than 50% respiratory variability, suggesting right atrial pressure of 3 mmHg. 11. Aneurysm of the ascending aorta, measuring 48 mm.  ASSESSMENT:    1. Essential hypertension   2. PVC's (premature ventricular contractions)   3. Medication management   4. CAD in native artery   5. Thoracic aortic aneurysm without rupture (HCC)   6. Hyperlipidemia, unspecified hyperlipidemia type    PLAN:    Hypertension: Previously was on amlodipine 5 mg daily, carvedilol 25 mg twice daily, spironolactone 25 mg daily, and azilsartan-chlorthalidone 40-25 mg.  Given aortic aneurysm, recommend goal SBP less than 120.  Has improved significantly since starting CPAP and with weight loss, have been able to cut back on regimen.   -Currently on azilsartan-chlorthalidone 40-25 mg daily, carvedilol 18.75 mg twice daily (recently decreased from 25 mg BID).  BP mildly elevated today and frequent  PVCs on rhythm strip today, will increase carvedilol back to 25 mg BID.  Asked patient to check BP twice daily for next 2 weeks and call with results.  PVCs:  frequent PVCs (23% of beats).  Symptomatic episodes on monitor appear to correspond to  episodes of bigeminy.   TTE shows low normal systolic function (EF ~50%), septal hypokinesis.  No evidence of ischemia on Myoview.  Seen by EP, Dr Ladona Ridgel recommended titrating up coreg, with repeat monitor in 11/11/2019 showed significant improvement in PVC burden (5.3% of beats). -Significantly improved with carvedilol, will increase dose back to 25 mg BID as above  Aortic aneurysm: 4.7 cm ascending aortic aneurysm on CTA chest 12/18.  Repeat CTA chest 12/09/19 showed stable aneurysm, measured 4.5 cm.  Follows with Dr. Vickey Sages in cardiothoracic surgery, 71-month follow-up CT on 06/11/2020 showed stable aneurysm at 4.7 cm.  Planning f/u CT in 05/2021  CAD: Reports atypical chest pain.  Given frequent PVCs and hypokinesis seen on TTE, Lexiscan Myoview was done on 08/18/19, which showed no evidence of ischemia.  Coronary CTA on 12/08/2019 showed minimal nonobstructive CAD in mid LAD, calcium score 19 (84th percentile). -Continue rosuvastatin 10 mg daily.  LDL at goal less than 70  Hyperlipidemia: LDL 113 on 01/31/2020.  Started rosuvastatin 10 mg daily, LDL 65 on 06/05/2020.  OSA: Reports compliance with CPAP  T2DM: reports started on Ozempic by PCP, A1c 6.6%.    RTC in 3 months   Medication Adjustments/Labs and Tests Ordered: Current medicines are reviewed at length with the patient today.  Concerns regarding medicines are outlined above.  Orders Placed This Encounter  Procedures  . Basic metabolic panel  . Magnesium  . EKG 12-Lead   Meds ordered this encounter  Medications  . carvedilol (COREG) 25 MG tablet    Sig: Take 1 tablet (25 mg total) by mouth 2 (two) times daily with a meal.    Dispense:  180 tablet    Refill:  3    Dose increase     Patient Instructions  Medication Instructions:  INCREASE carvedilol (Coreg) to 25 mg two times daily  Please check your blood pressure at home daily, write it down.  Call the office or send message via Mychart with the readings in 2 weeks for Dr. Bjorn Pippin to review.   *If you need a refill on your cardiac medications before your next appointment, please call your pharmacy*   Lab Work: BMET, Mag today   If you have labs (blood work) drawn today and your tests are completely normal, you will receive your results only by: Marland Kitchen MyChart Message (if you have MyChart) OR . A paper copy in the mail If you have any lab test that is abnormal or we need to change your treatment, we will call you to review the results.   Follow-Up: At [facility], you and your health needs are our priority.  As part of our continuing mission to provide you with exceptional heart care, we have created designated Provider Care Teams.  These Care Teams include your primary Cardiologist (physician) and Advanced Practice Providers (APPs -  Physician Assistants and Nurse Practitioners) who all work together to provide you with the care you need, when you need it.  We recommend signing up for the patient portal called "MyChart".  Sign up information is provided on this After Visit Summary.  MyChart is used to connect with patients for Virtual Visits (Telemedicine).  Patients are able to view lab/test results, encounter notes, upcoming appointments, etc.  Non-urgent messages can be sent to your provider as well.   To learn more about what you can do with MyChart, go to [email or web address].    Your next appointment:   3 month(s)  The format for your next appointment:  In Person  Provider:   Epifanio Lesches, MD        Signed, Little Ishikawa, MD  10/01/2020 5:41 PM    [facility]

## 2020-10-01 ENCOUNTER — Ambulatory Visit: Payer: Medicare Other | Admitting: Cardiology

## 2020-10-01 ENCOUNTER — Other Ambulatory Visit: Payer: Self-pay

## 2020-10-01 ENCOUNTER — Encounter: Payer: Self-pay | Admitting: Cardiology

## 2020-10-01 VITALS — BP 130/95 | HR 93 | Ht 73.0 in | Wt 305.0 lb

## 2020-10-01 DIAGNOSIS — I493 Ventricular premature depolarization: Secondary | ICD-10-CM

## 2020-10-01 DIAGNOSIS — I10 Essential (primary) hypertension: Secondary | ICD-10-CM | POA: Diagnosis not present

## 2020-10-01 DIAGNOSIS — Z79899 Other long term (current) drug therapy: Secondary | ICD-10-CM

## 2020-10-01 DIAGNOSIS — I712 Thoracic aortic aneurysm, without rupture, unspecified: Secondary | ICD-10-CM

## 2020-10-01 DIAGNOSIS — E785 Hyperlipidemia, unspecified: Secondary | ICD-10-CM

## 2020-10-01 DIAGNOSIS — I2510 Atherosclerotic heart disease of native coronary artery without angina pectoris: Secondary | ICD-10-CM

## 2020-10-01 MED ORDER — CARVEDILOL 25 MG PO TABS: 25.0000 mg | ORAL_TABLET | Freq: Two times a day (BID) | ORAL | 3 refills | Status: DC

## 2020-10-01 NOTE — Patient Instructions (Signed)
Medication Instructions:  INCREASE carvedilol (Coreg) to 25 mg two times daily  Please check your blood pressure at home daily, write it down.  Call the office or send message via Mychart with the readings in 2 weeks for Dr. Bjorn Pippin to review.   *If you need a refill on your cardiac medications before your next appointment, please call your pharmacy*   Lab Work: BMET, Mag today   If you have labs (blood work) drawn today and your tests are completely normal, you will receive your results only by: Marland Kitchen MyChart Message (if you have MyChart) OR . A paper copy in the mail If you have any lab test that is abnormal or we need to change your treatment, we will call you to review the results.   Follow-Up: At [facility], you and your health needs are our priority.  As part of our continuing mission to provide you with exceptional heart care, we have created designated Provider Care Teams.  These Care Teams include your primary Cardiologist (physician) and Advanced Practice Providers (APPs -  Physician Assistants and Nurse Practitioners) who all work together to provide you with the care you need, when you need it.  We recommend signing up for the patient portal called "MyChart".  Sign up information is provided on this After Visit Summary.  MyChart is used to connect with patients for Virtual Visits (Telemedicine).  Patients are able to view lab/test results, encounter notes, upcoming appointments, etc.  Non-urgent messages can be sent to your provider as well.   To learn more about what you can do with MyChart, go to [email or web address].    Your next appointment:   3 month(s)  The format for your next appointment:   In Person  Provider:   Epifanio Lesches, MD

## 2020-10-02 LAB — BASIC METABOLIC PANEL
BUN/Creatinine Ratio: 16 (ref 9–20)
BUN: 13 mg/dL (ref 6–24)
CO2: 27 mmol/L (ref 20–29)
Calcium: 9.1 mg/dL (ref 8.7–10.2)
Chloride: 100 mmol/L (ref 96–106)
Creatinine, Ser: 0.81 mg/dL (ref 0.76–1.27)
GFR calc Af Amer: 120 mL/min/{1.73_m2} (ref >59)
GFR calc non Af Amer: 104 mL/min/{1.73_m2} (ref >59)
Glucose: 82 mg/dL (ref 65–99)
Potassium: 3.7 mmol/L (ref 3.5–5.2)
Sodium: 142 mmol/L (ref 134–144)

## 2020-10-02 LAB — MAGNESIUM: Magnesium: 2 mg/dL (ref 1.6–2.3)

## 2020-10-23 ENCOUNTER — Ambulatory Visit: Payer: Medicare Other

## 2020-10-23 NOTE — Progress Notes (Deleted)
Patient ID: Ricky [name]                 DOB: 12-Sep-1970                      MRN: [id number]     HPI: Ricky [name] is a pleasant 50 y.o. male referred by Dr. Bjorn Pippin to HTN clinic. PMH is significant for PVCs, thoracic aortic aneurysm, OSA, HTN, thrombocytopenia, coronary CTA on 12/08/19 with calcium score of 19 (84th percentile), minimal nonobstructive CAD in mid LAD. BP meds have been adjusted recently by pt due to dizziness (had changed from azilsartan-chlorthalidone to losartan, pt had also stopped spironolactone), then restarted due to elevated BP. At visit with Dr Bjorn Pippin on 12/6, BP was elevated at 160/131mmHg and spironolactone was increased to 25mg  daily. At follow up visit with me on 12/21, BP was well controlled but pt reported occasional episodes of hypotension to the 80s/40s at home, so his spironolactone dose was reduced back to 12.5mg . He called clinic later that day when BP dropped to 89/55 after taking his scheduled doses of azilsartan-chlorthalidone and carvedilol. He had not taken spironolactone in 2 days. Advised pt to start cutting his azilsartan-chlorthalidone in half, hold his spironolactone and keep a close eye on his readings. At next visit on 1/4, BP was well controlled but carvedilol dose was decreased to 18.75mg  BID due to HR in the 40s at home. He tested positive for COVID on 1/17 and subsequently reported higher BP readigs 130-170s systolic. He self increased his azilsartan-chlorthalidone back to full 40-25mg  dose and increased his carvedilol back to 25mg  BID. Also took 2 doses of spironolactone. His HR started dropping to the 40-50s so he was again advised to decrease his carvedilol dose back to 18.75mg  BID. At follow up visit with Dr 2/17 on 2/14, BP had increased to 130/95 and HR was 93 along with frequent PVCs so his carvedilol was increased back to 25mg  BID.   Home cuff measuring higher by at least Bjorn Pippin, did he get a new cuff? Checking bp a few hours after  taking meds  Send in new rx for azilsartan-chlorthalidone full dose (still has 1/2 tab on list) HR ok on full dose coreg? Leave bc of pvc's Not taking any spiro correct? Can add or dec azil-chlorthal if needed Taking ozempic scheduled weekly again?  Current HTN meds:  Azilsartan-chlorthalidone 40-25mg  - 1 tab daily at 10am Carvedilol 18.75mg  BID - 10am and 10pm  Previously tried: losartan - ineffective  BP goal: <120/41mmHg per Dr d/t aortic aneurysm  Family History: Mother with history of DM, HTN, HLD, stroke, obesity, anxiety. Father with HTN and HLD.  Social History: Former smoker 1/2 PPD for 6 years, quit in 2013. Denies alcohol and illicit drug use.  Diet: Does not add salt to his food. Has a cup of coffee in the AM. Likes vegetables, avoids sweets.  Exercise: Osteoarthritis in his right knee, Baker cyst behind right knee has limited activity.  BP readings:   Wt Readings from Last 3 Encounters:  10/01/20 (!) 305 lb (138.3 kg)  07/23/20 (!) 302 lb (137 kg)  07/11/20 297 lb 3.2 oz (134.8 kg)   BP Readings from Last 3 Encounters:  10/01/20 (!) 130/95  09/25/20 106/82  08/21/20 112/78   Pulse Readings from Last 3 Encounters:  10/01/20 93  09/25/20 61  08/21/20 (!) 58    Renal function: CrCl cannot be calculated (Patient's most recent lab result is older  than the maximum 21 days allowed.).  Past Medical History:  Diagnosis Date  . Anxiety   . Back pain   . 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 Chest pain   . Depression   . High cholesterol   . Hypertension   . Osteoarthritis   . Osteoarthritis   . Overweight   . SOB (shortness of breath)   . Thrombocytopenia (HCC) 07/07/2012   138,000 08/26/11; 98,000 06/29/12    Current Outpatient Medications on File Prior to Visit  Medication Sig Dispense Refill  . Azilsartan-Chlorthalidone 40-25 MG TABS Take 0.5 tablets by mouth daily. (Patient taking differently: Take 1  tablet by mouth daily.) 45 tablet 3  . Blood Pressure Monitoring (BLOOD PRESSURE CUFF) MISC XL Adult blood pressure cuff 1 each 0  . carvedilol (COREG) 25 MG tablet Take 1 tablet (25 mg total) by mouth 2 (two) times daily with a meal. 180 tablet 3  . cyclobenzaprine (FLEXERIL) 10 MG tablet Take 1 tablet (10 mg total) by mouth 2 (two) times daily as needed for muscle spasms. 20 tablet 0  . dicyclomine (BENTYL) 20 MG tablet Take 1 tablet (20 mg total) by mouth 2 (two) times daily between meals as needed for spasms. 20 tablet 0  . Insulin Pen Needle 32G X 4 MM MISC Use to inject Ozempic once weekly 100 each 1  . omeprazole (PRILOSEC) 20 MG capsule Take 1 capsule (20 mg total) by mouth daily. 30 capsule 1  . rosuvastatin (CRESTOR) 10 MG tablet Take 1 tablet (10 mg total) by mouth daily. 90 tablet 3  . Semaglutide,0.25 or 0.5MG /DOS, (OZEMPIC, 0.25 OR 0.5 MG/DOSE,) 2 MG/1.5ML SOPN Inject 0.5 mg into the skin once a week.    . sildenafil (VIAGRA) 100 MG tablet TAKE 1/2 TO 1 TABLET BY MOUTH AS NEEDED 10 tablet 1   No current facility-administered medications on file prior to visit.    No Known Allergies  Assessment/Plan:  1. Hypertension - BP at goal <120/79mmHg during today's visit and with almost all home readings. BP readings in the evening are slightly elevated, however pt's BP cuff measured 10-75mmHg higher systolic than manual reading in clinic, confirmed multiple times. Encouraged pt to look for large size Omron bicep cuff since his current cuff is overestimating his BP readings. Pt has not experienced any episodes of hypotension since cutting his azilsartan-chlorthalidone in half. Will continue lower dose of azilsartan-chlorthalidone 40-25mg  1/2 tablet daily (not available in combo pill at lower dose). Will decrease carvedilol from 25mg  BID to 18.75mg  BID since his HR has been in the 40s frequently at home. Pt aware to continue holding his spironolactone since BP has been controlled without it. Did  advise pt to check BP around lunch time for readings that will better show the efficacy of his BP meds since he's currently monitoring BP before taking meds. Will follow up with pt in 3 weeks per pt preference as he wishes to keep a close eye on his BP management; f/u with Dr in 6 weeks.  Ricky [name], PharmD, BCACP, CPP [facility] [address], [place], [place] Phone: [phone]; Fax: [phone] 10/23/2020 7:37 AM

## 2020-11-01 ENCOUNTER — Other Ambulatory Visit: Payer: Self-pay

## 2020-11-01 ENCOUNTER — Ambulatory Visit (INDEPENDENT_AMBULATORY_CARE_PROVIDER_SITE_OTHER): Payer: Medicare Other | Admitting: Pharmacist

## 2020-11-01 VITALS — BP 102/80 | HR 79

## 2020-11-01 DIAGNOSIS — I10 Essential (primary) hypertension: Secondary | ICD-10-CM

## 2020-11-01 MED ORDER — AZILSARTAN-CHLORTHALIDONE 40-25 MG PO TABS: 1.0000 | ORAL_TABLET | Freq: Every day | ORAL | 3 refills | Status: DC

## 2020-11-01 NOTE — Patient Instructions (Addendum)
Continue taking your current medications  Your blood pressure goal is < 120/74mmHg  Monitor and record your blood pressure 1-2 hours after taking your morning medications so that we can get a better idea for how well your doses are working for you  Please bring your home cuff and readings to your next visit in 1 month

## 2020-11-01 NOTE — Progress Notes (Signed)
Patient ID: YAPHET SMETHURST                 DOB: 1971-04-09                      MRN: [id number]     HPI: Ricky [name] is a pleasant 50 y.o. male referred by Dr. Bjorn Pippin to HTN clinic. PMH is significant for PVCs, thoracic aortic aneurysm, OSA, HTN, thrombocytopenia, coronary CTA on 12/08/19 with calcium score of 19 (84th percentile), minimal nonobstructive CAD in mid LAD. BP meds have been adjusted recently by pt due to dizziness (had changed from azilsartan-chlorthalidone to losartan, pt had also stopped spironolactone), then restarted due to elevated BP. At visit with Dr Bjorn Pippin on 12/6, BP was elevated at 160/165mmHg and spironolactone was increased to 25mg  daily. At follow up visit with me on 12/21, BP was well controlled but pt reported occasional episodes of hypotension to the 80s/40s at home, so his spironolactone dose was reduced back to 12.5mg . He called clinic later that day when BP dropped to 89/55 after taking his scheduled doses of azilsartan-chlorthalidone and carvedilol. He had not taken spironolactone in 2 days. Advised pt to start cutting his azilsartan-chlorthalidone in half, hold his spironolactone and keep a close eye on his readings. At next visit on 1/4, BP was well controlled but carvedilol dose was decreased to 18.75mg  BID due to HR in the 40s at home. He tested positive for COVID on 1/17 and subsequently reported higher BP readings 130-170s systolic. He self increased his azilsartan-chlorthalidone back to full 40-25mg  dose and increased his carvedilol back to 25mg  BID. Also took 2 doses of spironolactone. His HR started dropping to the 40-50s so he was again advised to decrease his carvedilol dose back to 18.75mg  BID. At follow up visit with Dr 2/17 on 2/14, BP had increased to 130/95 and HR was 93 along with frequent PVCs so his carvedilol was increased back to 25mg  BID.  Pt presents today for follow up. His son was in a car accident last night and pt spent all night in the  ER with him. He missed his evening dose of carvedilol last night but did take his AM meds this morning before coming to his appt. Reports tolerating his medications well overall. Had a few muscle cramps and took K supplementation for about 3 days which helped. Now has been drinking more water which has helped to prevent cramps as well. He purchased new Omron cuff (using same machine though) since prior cuff was measuring ~58mmHg. Currently checks BP at the same time he takes his meds (previously would skip med doses if he was hypotensive). Worried about a the systolic BP readings that have been 130-140 range at home since his BP goal is < 120/8mmHg given aortic aneurysm. Did start taking his Ozempic weekly as previously instructed.  Current HTN meds:  Azilsartan-chlorthalidone 40-25mg  - 1 tab daily at 10am Carvedilol 25mg  BID - 10am and 10pm  Previously tried: losartan - ineffective  BP goal: <120/94mmHg per Dr 30m d/t aortic aneurysm  Family History: Mother with history of DM, HTN, HLD, stroke, obesity, anxiety. Father with HTN and HLD.  Social History: Former smoker 1/2 PPD for 6 years, quit in 2013. Denies alcohol and illicit drug use.  Diet: Does not add salt to his food. Has a cup of coffee in the AM. Likes vegetables, avoids sweets.  Exercise: Osteoarthritis in his right knee, Baker cyst behind right knee has limited activity.  BP readings: Checks twice daily at same time he takes his meds. Readings range 109/80 - 148/75. Most readings 110-120s/70s. HR mainly 50-60s, 3 readings in the 40s (continuing higher dose carvedilol due to PVCs)  Wt Readings from Last 3 Encounters:  10/01/20 (!) 305 lb (138.3 kg)  07/23/20 (!) 302 lb (137 kg)  07/11/20 297 lb 3.2 oz (134.8 kg)   BP Readings from Last 3 Encounters:  10/01/20 (!) 130/95  09/25/20 106/82  08/21/20 112/78   Pulse Readings from Last 3 Encounters:  10/01/20 93  09/25/20 61  08/21/20 (!) 58    Renal function: CrCl  cannot be calculated (Patient's most recent lab result is older than the maximum 21 days allowed.).  Past Medical History:  Diagnosis Date  . Anxiety   . Back pain   . 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 Chest pain   . Depression   . High cholesterol   . Hypertension   . Osteoarthritis   . Osteoarthritis   . Overweight   . SOB (shortness of breath)   . Thrombocytopenia (HCC) 07/07/2012   138,000 08/26/11; 98,000 06/29/12    Current Outpatient Medications on File Prior to Visit  Medication Sig Dispense Refill  . Azilsartan-Chlorthalidone 40-25 MG TABS Take 0.5 tablets by mouth daily. (Patient taking differently: Take 1 tablet by mouth daily.) 45 tablet 3  . Blood Pressure Monitoring (BLOOD PRESSURE CUFF) MISC XL Adult blood pressure cuff 1 each 0  . carvedilol (COREG) 25 MG tablet Take 1 tablet (25 mg total) by mouth 2 (two) times daily with a meal. 180 tablet 3  . cyclobenzaprine (FLEXERIL) 10 MG tablet Take 1 tablet (10 mg total) by mouth 2 (two) times daily as needed for muscle spasms. 20 tablet 0  . dicyclomine (BENTYL) 20 MG tablet Take 1 tablet (20 mg total) by mouth 2 (two) times daily between meals as needed for spasms. 20 tablet 0  . Insulin Pen Needle 32G X 4 MM MISC Use to inject Ozempic once weekly 100 each 1  . omeprazole (PRILOSEC) 20 MG capsule Take 1 capsule (20 mg total) by mouth daily. 30 capsule 1  . rosuvastatin (CRESTOR) 10 MG tablet Take 1 tablet (10 mg total) by mouth daily. 90 tablet 3  . Semaglutide,0.25 or 0.5MG /DOS, (OZEMPIC, 0.25 OR 0.5 MG/DOSE,) 2 MG/1.5ML SOPN Inject 0.5 mg into the skin once a week.    . sildenafil (VIAGRA) 100 MG tablet TAKE 1/2 TO 1 TABLET BY MOUTH AS NEEDED 10 tablet 1   No current facility-administered medications on file prior to visit.    No Known Allergies  Assessment/Plan:  1. Hypertension - BP at goal <120/76mmHg during today's visit, most home readings at goal with some slight  elevations. Will continue azilsartan-chlorthalidone 40-25mg  daily and carvedilol 25mg  BID. Encouraged pt to check BP 1-2 hours after taking his morning medications so that we can better assess the efficacy of his HTN regimen. Will follow up with pt in 4 weeks to ensure home readings are consistently at goal, pt advised to bring new BP cuff and home readings to next visit.  Bartlomiej Jenkinson E. Gerrad Welker, PharmD, BCACP, CPP [facility] [address] [place], [place] Phone: [phone]; Fax: [phone] 11/01/2020 8:03 AM

## 2020-11-03 ENCOUNTER — Other Ambulatory Visit: Payer: Self-pay | Admitting: Cardiology

## 2020-11-29 ENCOUNTER — Ambulatory Visit: Payer: Medicare Other

## 2020-12-03 ENCOUNTER — Other Ambulatory Visit: Payer: Self-pay | Admitting: Cardiology

## 2020-12-05 NOTE — Telephone Encounter (Signed)
Sildenafil was refer to pt PCP as per Dr Bjorn Pippin.

## 2021-01-02 ENCOUNTER — Encounter: Payer: Self-pay | Admitting: Cardiology

## 2021-01-02 ENCOUNTER — Ambulatory Visit: Payer: Medicare Other | Admitting: Cardiology

## 2021-01-02 ENCOUNTER — Other Ambulatory Visit: Payer: Self-pay

## 2021-01-02 ENCOUNTER — Other Ambulatory Visit: Payer: Self-pay | Admitting: Cardiology

## 2021-01-02 VITALS — BP 114/86 | HR 62 | Ht 72.0 in | Wt 295.8 lb

## 2021-01-02 DIAGNOSIS — I10 Essential (primary) hypertension: Secondary | ICD-10-CM

## 2021-01-02 DIAGNOSIS — I493 Ventricular premature depolarization: Secondary | ICD-10-CM

## 2021-01-02 DIAGNOSIS — I2510 Atherosclerotic heart disease of native coronary artery without angina pectoris: Secondary | ICD-10-CM

## 2021-01-02 DIAGNOSIS — I712 Thoracic aortic aneurysm, without rupture, unspecified: Secondary | ICD-10-CM

## 2021-01-02 DIAGNOSIS — I429 Cardiomyopathy, unspecified: Secondary | ICD-10-CM

## 2021-01-02 DIAGNOSIS — G4733 Obstructive sleep apnea (adult) (pediatric): Secondary | ICD-10-CM

## 2021-01-02 LAB — CBC
Hematocrit: 45.9 % (ref 37.5–51.0)
Hemoglobin: 15 g/dL (ref 13.0–17.7)
MCH: 29.3 pg (ref 26.6–33.0)
MCHC: 32.7 g/dL (ref 31.5–35.7)
MCV: 90 fL (ref 79–97)
Platelets: 155 10*3/uL (ref 150–450)
RBC: 5.12 x10E6/uL (ref 4.14–5.80)
RDW: 12.7 % (ref 11.6–15.4)
WBC: 5.9 10*3/uL (ref 3.4–10.8)

## 2021-01-02 LAB — BASIC METABOLIC PANEL
BUN/Creatinine Ratio: 16 (ref 9–20)
BUN: 13 mg/dL (ref 6–24)
CO2: 27 mmol/L (ref 20–29)
Calcium: 9.8 mg/dL (ref 8.7–10.2)
Chloride: 99 mmol/L (ref 96–106)
Creatinine, Ser: 0.8 mg/dL (ref 0.76–1.27)
Glucose: 121 mg/dL — ABNORMAL HIGH (ref 65–99)
Potassium: 3.5 mmol/L (ref 3.5–5.2)
Sodium: 139 mmol/L (ref 134–144)
eGFR: 107 mL/min/{1.73_m2} (ref >59)

## 2021-01-02 LAB — MAGNESIUM: Magnesium: 2.1 mg/dL (ref 1.6–2.3)

## 2021-01-02 MED ORDER — SILDENAFIL CITRATE 100 MG PO TABS: ORAL_TABLET | ORAL | 0 refills | Status: DC

## 2021-01-02 NOTE — Progress Notes (Signed)
Cardiology Office Note:    Date:  01/02/2021   ID:  Ricky [name], DOB 28-Feb-1971, MRN [id number]  PCP:  Maryagnes Amos, FNP  Cardiologist:  Little Ishikawa, MD  Electrophysiologist:  None   Referring MD: Maryagnes Amos   Chief Complaint  Patient presents with  . Hypertension    History of Present Illness:    Ricky [name] is a 50 y.o. male with a hx of frequent PVCs, thoracic aortic aneurysm, OSA hypertension, thrombocytopenia who presents for follow-up.  He was initially seen on 07/22/2019 after he was referred by Caryn Bee, FNP for an evaluation of PVCs.  Had an ED visit on 07/10/2019 with chest pain/body aches and lightheadedness.  EKG showed bigeminy. TTE was done on 07/28/2019 which showed low normal systolic function (EF 50 to 55%), septal hypokinesis.  Also was notable for ascending aortic aneurysm measuring up to 48 mm.  Underwent CTA chest on 08/16/2019, which showed ascending aortic aneurysm measuring up to 47mm.  Cardiac monitor showed frequent PVCs (23% of beats) and 120 episodes of SVT, lasting up to 1 minute.  Lexiscan Myoview on 08/18/2019 showed no evidence of ischemia.  Diagnosed with OSA.  He was referred to Dr. Ladona Ridgel in EP for evaluation of his PVCs, recommended uptitrating his beta-blocker as initial treatment and repeat monitor once on Coreg 25 mg twice daily.  Repeat monitor in 11/11/2019 showed significant improvement in PVC burden (5.3% of beats).  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  Today, he is doing well. He states that within the last two days he experienced a sharp chest pain while he is resting after he had just completed yard work. When the pain occurs it lasts for one second and then the symptoms resolve. He also states that he thinks his potassium level are low because he has been experiencing muscle cramps in his arms. He is also concerned about his  blood pressure. He states that he has been using his CPAP machine on and off. He states that his at home readings of his blood pressure had diastolic numbers of about 77-92 but the systolic numbers are well maintained. He reports that he is experiencing knee pain. He states that this is because he is no longer getting the Zilretta injection for his arthritis. He would like to get this injection again because he wants to exercise more. He denies any palpitations, SOB, lower extremity edema, lightheadedness, and syncope.       BP Readings from Last 3 Encounters:  01/02/21 114/86  11/01/20 102/80  10/01/20 (!) 130/95    Wt Readings from Last 3 Encounters:  01/02/21 295 lb 12.8 oz (134.2 kg)  10/01/20 (!) 305 lb (138.3 kg)  07/23/20 (!) 302 lb (137 kg)      Past Medical History:  Diagnosis Date  . Anxiety   . Back pain   . 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 Chest pain   . Depression   . High cholesterol   . Hypertension   . Osteoarthritis   . Osteoarthritis   . Overweight   . SOB (shortness of breath)   . Thrombocytopenia (HCC) 07/07/2012   138,000 08/26/11; 98,000 06/29/12    Past Surgical History:  Procedure Laterality Date  . ADENOIDECTOMY    . ANTERIOR FUSION CERVICAL SPINE    . APPENDECTOMY    . HERNIA REPAIR    .  IRRIGATION AND DEBRIDEMENT SEBACEOUS CYST     on scalp  . KNEE ARTHROSCOPY     x2  . LUMBAR FUSION    . POSTERIOR FUSION CERVICAL SPINE    . TONSILLECTOMY      Current Medications: Current Meds  Medication Sig  . Azilsartan-Chlorthalidone 40-25 MG TABS Take 1 tablet by mouth daily.  . Blood Pressure Monitoring (BLOOD PRESSURE CUFF) MISC XL Adult blood pressure cuff  . carvedilol (COREG) 25 MG tablet Take 1 tablet (25 mg total) by mouth 2 (two) times daily with a meal.  . cyclobenzaprine (FLEXERIL) 10 MG tablet Take 1 tablet (10 mg total) by mouth 2 (two) times daily as needed for muscle spasms.  Marland Kitchen  dicyclomine (BENTYL) 20 MG tablet Take 1 tablet (20 mg total) by mouth 2 (two) times daily between meals as needed for spasms.  . Insulin Pen Needle 32G X 4 MM MISC Use to inject Ozempic once weekly  . omeprazole (PRILOSEC) 20 MG capsule Take 1 capsule (20 mg total) by mouth daily.  . rosuvastatin (CRESTOR) 10 MG tablet Take 1 tablet (10 mg total) by mouth daily.  . Semaglutide,0.25 or 0.5MG /DOS, (OZEMPIC, 0.25 OR 0.5 MG/DOSE,) 2 MG/1.5ML SOPN Inject 0.5 mg into the skin once a week.  . [DISCONTINUED] sildenafil (VIAGRA) 100 MG tablet TAKE 1/2 TO 1 TABLET BY MOUTH AS NEEDED     Allergies:   Patient has no known allergies.   Social History   Socioeconomic History  . Marital status: Single    Spouse name: Not on file  . Number of children: Not on file  . Years of education: Not on file  . Highest education level: Not on file  Occupational History  . Occupation: Disabled  Tobacco Use  . Smoking status: Former Smoker    Packs/day: 0.50    Years: 6.00    Pack years: 3.00    Types: Cigarettes    Quit date: 2013    Years since quitting: 9.3  . Smokeless tobacco: Never Used  Substance and Sexual Activity  . Alcohol use: No  . Drug use: No  . Sexual activity: Not on file  Other Topics Concern  . Not on file  Social History Narrative  . Not on file   Social Determinants of Health   Financial Resource Strain: Not on file  Food Insecurity: Not on file  Transportation Needs: Not on file  Physical Activity: Not on file  Stress: Not on file  Social Connections: Not on file     Family History: No family history of heart disease  ROS:   Please see the history of present illness.    All other systems reviewed and are negative.  EKGs/Labs/Other Studies Reviewed:    The following studies were reviewed today:  EKG:   01/02/2021- The ekg ordered today demonstrates normal sinus rhythm, rate 62, no PVC's  10/01/2020- The ekg ordered demonstrates sinus rhythm, rate 93,  PVCs  Recent Labs: 04/05/2020: TSH 1.010 07/23/2020: ALT 19 08/07/2020: Hemoglobin 14.8; Platelets 149 10/01/2020: BUN 13; Creatinine, Ser 0.81; Magnesium 2.0; Potassium 3.7; Sodium 142  Recent Lipid Panel    Component Value Date/Time   CHOL 126 06/05/2020 1022   TRIG 146 06/05/2020 1022   HDL 35 (L) 06/05/2020 1022   CHOLHDL 3.6 06/05/2020 1022   LDLCALC 65 06/05/2020 1022    Physical Exam:    VS:  BP 114/86 (BP Location: Left Arm, Patient Position: Sitting, Cuff Size: Large)   Pulse 62  Ht 6' (1.829 m)   Wt 295 lb 12.8 oz (134.2 kg)   BMI 40.12 kg/m     Wt Readings from Last 3 Encounters:  01/02/21 295 lb 12.8 oz (134.2 kg)  10/01/20 (!) 305 lb (138.3 kg)  07/23/20 (!) 302 lb (137 kg)     GEN:  Well nourished, well developed in no acute distress HEENT: Normal NECK: No JVD CARDIAC: RRR, no murmurs, rubs, gallops RESPIRATORY:  Clear to auscultation without rales, wheezing or rhonchi  ABDOMEN: Soft, non-tender, non-distended MUSCULOSKELETAL:  No edema; No deformity  SKIN: Warm and dry NEUROLOGIC:  Alert and oriented x 3 PSYCHIATRIC:  Normal affect    Cardiac monitor 08/21/19:  Frequent ventricular ectopy (23% of beats)  120 episodes of SVT, longest lasting 59 seconds at 152 bpm  One 4 beat episode of NSVT  Patient triggered events corresponded to sinus rhythm with ventricular ectopy, incluging bigeminy   4 days of data recorded on Zio monitor. Patient had a min HR of 46 bpm, max HR of 207 bpm, and avg HR of 77 bpm. Predominant underlying rhythm was Sinus Rhythm. No atrial fibrillation, high degree block, or pauses noted. There was one 4 beat run of NSVT and 120 runs of SVT, longest lasting 59 seconds at 152 bpm.  Isolated atrial was ectopy was rare (<1%).  Very frequent ventricular ectopy (23% of beats).  Episodes of bigeminy (longest lasting 132 seconds) and trigeminy (longest lasting 98 seconds).  There were 2 triggered events, corresponding to sinus rhythm with  ventricular bigeminy.   No significant arrhythmias detected.  CTA chest 08/16/19: Aneurysmal dilatation of the ascending thoracic aorta up to 47 mm. Recommend semi-annual imaging followup by CTA or MRA and referral to cardiothoracic surgery if not already obtained. This recommendation follows 2010 ACCF/AHA/AATS/ACR/ASA/SCA/SCAI/SIR/STS/SVM Guidelines for the Diagnosis and Management of Patients With Thoracic Aortic Disease. Circulation. 2010; 121: F681-E751. Aortic aneurysm NOS (ICD10-I71.9)   Lexiscan Myoview 08/18/2019:  The study is normal.  This is a low risk study.   Normal pharmacologic nuclear stress test with no evidence for prior infarct or ischemia. LVEF not calculated. Frequent PVCs.  TTE 07/28/19:  1. Left ventricular ejection fraction, by visual estimation, is 50 to 55%. The left ventricle has low normal function. There is mildly increased left ventricular hypertrophy.  2. Mildly dilated left ventricular internal cavity size.  3. The left ventricle demonstrates regional wall motion abnormalities. Septal hypokinesis  4. Global right ventricle has normal systolic function.The right ventricular size is normal. No increase in right ventricular wall thickness.  5. Left atrial size was normal.  6. Right atrial size was normal.  7. The mitral valve is normal in structure. No evidence of mitral valve regurgitation.  8. The tricuspid valve is normal in structure. Tricuspid valve regurgitation is not demonstrated.  9. The aortic valve is tricuspid. Aortic valve regurgitation is not visualized. No evidence of aortic valve sclerosis or stenosis. 10. The inferior vena cava is normal in size with greater than 50% respiratory variability, suggesting right atrial pressure of 3 mmHg. 11. Aneurysm of the ascending aorta, measuring 48 mm.  ASSESSMENT:    1. Frequent PVCs   2. Cardiomyopathy, unspecified type (HCC)   3. Essential hypertension   4. Primary hypertension   5. Thoracic  aortic aneurysm without rupture (HCC)   6. CAD in native artery   7. OSA (obstructive sleep apnea)    PLAN:    Hypertension: Previously was on amlodipine 5 mg daily, carvedilol 25 mg  twice daily, spironolactone 25 mg daily, and azilsartan-chlorthalidone 40-25 mg.  Given aortic aneurysm, recommend goal SBP less than 120.  Has improved significantly since starting CPAP and with weight loss, have been able to cut back on regimen.   -Currently on azilsartan-chlorthalidone 40-25 mg daily, carvedilol 25 mg BID.  Appears controlled.  Diastolic BP mildly elevated, he reports he has not been compliant with his CPAP recently.  Encourage CPAP compliance  PVCs:  frequent PVCs (23% of beats).  Symptomatic episodes on monitor appear to correspond to episodes of bigeminy.   TTE shows low normal systolic function (EF ~50%), septal hypokinesis.  No evidence of ischemia on Myoview.  Seen by EP, Dr Ladona Ridgel recommended titrating up coreg, with repeat monitor in 11/11/2019 showed significant improvement in PVC burden (5.3% of beats). -Significantly improved with carvedilol, will continue carvedilol 25 mg twice daily -Given frequent PVCs and echo did show septal hypokinesis, recommend evaluating for cardiac sarcoid.  Will order cardiac MRI  Aortic aneurysm: 4.7 cm ascending aortic aneurysm on CTA chest 12/18.  Repeat CTA chest 12/09/19 showed stable aneurysm, measured 4.5 cm.  Follows with Dr. Vickey Sages in cardiothoracic surgery, 39-month follow-up CT on 06/11/2020 showed stable aneurysm at 4.7 cm.  Planning f/u CT in 05/2021  CAD: Reports atypical chest pain.  Given frequent PVCs and hypokinesis seen on TTE, Lexiscan Myoview was done on 08/18/19, which showed no evidence of ischemia.  Coronary CTA on 12/08/2019 showed minimal nonobstructive CAD in mid LAD, calcium score 19 (84th percentile). -Continue rosuvastatin 10 mg daily.  LDL at goal less than 70  Hyperlipidemia: LDL 113 on 01/31/2020.  Started rosuvastatin 10 mg daily,  LDL 65 on 06/05/2020.  OSA: Encouraged compliance with CPAP  T2DM: On Ozempic, A1c 6.6% on 04/05/2020  RTC in 3 months    Medication Adjustments/Labs and Tests Ordered: Current medicines are reviewed at length with the patient today.  Concerns regarding medicines are outlined above.  Orders Placed This Encounter  Procedures  . MR CARDIAC MORPHOLOGY W WO CONTRAST  . Basic metabolic panel  . CBC  . Magnesium  . EKG 12-Lead   Meds ordered this encounter  Medications  . sildenafil (VIAGRA) 100 MG tablet    Sig: TAKE 1/2 TO 1 TABLET BY MOUTH AS NEEDED. Defer further refills to PCP.    Dispense:  10 tablet    Refill:  0    Defer refills to PCP    Patient Instructions  Medication Instructions:  Your physician recommends that you continue on your current medications as directed. Please refer to the Current Medication list given to you today.  *If you need a refill on your cardiac medications before your next appointment, please call your pharmacy*   Lab Work: BMET, CBC, Mag today  If you have labs (blood work) drawn today and your tests are completely normal, you will receive your results only by: Marland Kitchen MyChart Message (if you have MyChart) OR . A paper copy in the mail If you have any lab test that is abnormal or we need to change your treatment, we will call you to review the results.   Testing/Procedures: Your physician has requested that you have a cardiac MRI. Cardiac MRI uses a computer to create images of your heart as its beating, producing both still and moving pictures of your heart and major blood vessels. For further information please visit [email or web address]. Please follow the instruction sheet given to you today for more information.  Follow-Up: At [facility], you and your  health needs are our priority.  As part of our continuing mission to provide you with exceptional heart care, we have created designated Provider Care Teams.  These Care Teams include your  primary Cardiologist (physician) and Advanced Practice Providers (APPs -  Physician Assistants and Nurse Practitioners) who all work together to provide you with the care you need, when you need it.  We recommend signing up for the patient portal called "MyChart".  Sign up information is provided on this After Visit Summary.  MyChart is used to connect with patients for Virtual Visits (Telemedicine).  Patients are able to view lab/test results, encounter notes, upcoming appointments, etc.  Non-urgent messages can be sent to your provider as well.   To learn more about what you can do with MyChart, go to [email or web address].    Your next appointment:   3 month(s)  The format for your next appointment:   In Person  Provider:   Epifanio Lescheshristopher Chioke Noxon, MD        Caprice BeaverI,Rebekah Moorehead,acting as a scribe for Little Ishikawahristopher L Keelen Quevedo, MD.,have documented all relevant documentation on the behalf of Little Ishikawahristopher L Manases Etchison, MD,as directed by  Little Ishikawahristopher L Cheron Coryell, MD while in the presence of Little Ishikawahristopher L Ellee Wawrzyniak, MD.  I, Little Ishikawahristopher L Lexianna Weinrich, MD, have reviewed all documentation for this visit. The documentation on 01/02/21 for the exam, diagnosis, procedures, and orders are all accurate and complete.   Signed, Little Ishikawahristopher L Caitlen Worth, MD  01/02/2021 10:38 AM    [facility]

## 2021-01-02 NOTE — Patient Instructions (Signed)
Medication Instructions:  Your physician recommends that you continue on your current medications as directed. Please refer to the Current Medication list given to you today.  *If you need a refill on your cardiac medications before your next appointment, please call your pharmacy*   Lab Work: BMET, CBC, Mag today  If you have labs (blood work) drawn today and your tests are completely normal, you will receive your results only by: Marland Kitchen MyChart Message (if you have MyChart) OR . A paper copy in the mail If you have any lab test that is abnormal or we need to change your treatment, we will call you to review the results.   Testing/Procedures: Your physician has requested that you have a cardiac MRI. Cardiac MRI uses a computer to create images of your heart as its beating, producing both still and moving pictures of your heart and major blood vessels. For further information please visit [email or web address]. Please follow the instruction sheet given to you today for more information.  Follow-Up: At [facility], you and your health needs are our priority.  As part of our continuing mission to provide you with exceptional heart care, we have created designated Provider Care Teams.  These Care Teams include your primary Cardiologist (physician) and Advanced Practice Providers (APPs -  Physician Assistants and Nurse Practitioners) who all work together to provide you with the care you need, when you need it.  We recommend signing up for the patient portal called "MyChart".  Sign up information is provided on this After Visit Summary.  MyChart is used to connect with patients for Virtual Visits (Telemedicine).  Patients are able to view lab/test results, encounter notes, upcoming appointments, etc.  Non-urgent messages can be sent to your provider as well.   To learn more about what you can do with MyChart, go to [email or web address].    Your next appointment:   3 month(s)  The format for your  next appointment:   In Person  Provider:   Epifanio Lesches, MD

## 2021-01-02 NOTE — Progress Notes (Deleted)
Cardiology Office Note:    Date:  01/02/2021   ID:  Ricky [name], DOB 02/16/1971, MRN [id number]  PCP:  Maryagnes Amos, FNP  Cardiologist:  Little Ishikawa, MD  Electrophysiologist:  None   Referring MD: Maryagnes Amos   No chief complaint on file.   History of Present Illness:    Ricky [name] is a 50 y.o. male with a hx of frequent PVCs, thoracic aortic aneurysm, OSA hypertension, thrombocytopenia who presents for follow-up.  He was initially seen on 07/22/2019 after he was referred by Ricky Bee, FNP for an evaluation of PVCs.  Had an ED visit on 07/10/2019 with chest pain/body aches and lightheadedness.  EKG showed bigeminy. TTE was done on 07/28/2019 which showed low normal systolic function (EF 50 to 55%), septal hypokinesis.  Also was notable for ascending aortic aneurysm measuring up to 48 mm.  Underwent CTA chest on 08/16/2019, which showed ascending aortic aneurysm measuring up to 50mm.  Cardiac monitor showed frequent PVCs (23% of beats) and 120 episodes of SVT, lasting up to 1 minute.  Lexiscan Myoview on 08/18/2019 showed no evidence of ischemia.  Diagnosed with OSA.  He was referred to Dr. Ladona Ridgel in EP for evaluation of his PVCs, recommended uptitrating his beta-blocker as initial treatment and repeat monitor once on Coreg 25 mg twice daily.  Repeat monitor in 11/11/2019 showed significant improvement in PVC burden (5.3% of beats).  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  Since last clinic visit, reports he tested positive for Covid last month.  States that he had an exposure but was asymptomatic.  He reports labile blood pressure since having Covid.  Reports recently BP has been 110s to 130s over 70s to 90s.  Denies any lightheadedness or syncope.  Does report had a brief episode of chest pain that lasted for about 1 second but resolved.  Denies any dyspnea, lower extremity  edema, or palpitations.   BP Readings from Last 3 Encounters:  01/02/21 114/86  11/01/20 102/80  10/01/20 (!) 130/95    Wt Readings from Last 3 Encounters:  01/02/21 295 lb 12.8 oz (134.2 kg)  10/01/20 (!) 305 lb (138.3 kg)  07/23/20 (!) 302 lb (137 kg)      Past Medical History:  Diagnosis Date  . Anxiety   . Back pain   . 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 Chest pain   . Depression   . High cholesterol   . Hypertension   . Osteoarthritis   . Osteoarthritis   . Overweight   . SOB (shortness of breath)   . Thrombocytopenia (HCC) 07/07/2012   138,000 08/26/11; 98,000 06/29/12    Past Surgical History:  Procedure Laterality Date  . ADENOIDECTOMY    . ANTERIOR FUSION CERVICAL SPINE    . APPENDECTOMY    . HERNIA REPAIR    . IRRIGATION AND DEBRIDEMENT SEBACEOUS CYST     on scalp  . KNEE ARTHROSCOPY     x2  . LUMBAR FUSION    . POSTERIOR FUSION CERVICAL SPINE    . TONSILLECTOMY      Current Medications: Current Meds  Medication Sig  . Azilsartan-Chlorthalidone 40-25 MG TABS Take 1 tablet by mouth daily.  . Blood Pressure Monitoring (BLOOD PRESSURE CUFF) MISC XL Adult blood pressure cuff  . carvedilol (COREG) 25 MG tablet Take 1 tablet (25 mg total) by mouth 2 (two)  times daily with a meal.  . cyclobenzaprine (FLEXERIL) 10 MG tablet Take 1 tablet (10 mg total) by mouth 2 (two) times daily as needed for muscle spasms.  Marland Kitchen dicyclomine (BENTYL) 20 MG tablet Take 1 tablet (20 mg total) by mouth 2 (two) times daily between meals as needed for spasms.  . Insulin Pen Needle 32G X 4 MM MISC Use to inject Ozempic once weekly  . omeprazole (PRILOSEC) 20 MG capsule Take 1 capsule (20 mg total) by mouth daily.  . rosuvastatin (CRESTOR) 10 MG tablet Take 1 tablet (10 mg total) by mouth daily.  . Semaglutide,0.25 or 0.5MG /DOS, (OZEMPIC, 0.25 OR 0.5 MG/DOSE,) 2 MG/1.5ML SOPN Inject 0.5 mg into the skin once a week.  . sildenafil (VIAGRA)  100 MG tablet TAKE 1/2 TO 1 TABLET BY MOUTH AS NEEDED     Allergies:   Patient has no known allergies.   Social History   Socioeconomic History  . Marital status: Single    Spouse name: Not on file  . Number of children: Not on file  . Years of education: Not on file  . Highest education level: Not on file  Occupational History  . Occupation: Disabled  Tobacco Use  . Smoking status: Former Smoker    Packs/day: 0.50    Years: 6.00    Pack years: 3.00    Types: Cigarettes    Quit date: 2013    Years since quitting: 9.3  . Smokeless tobacco: Never Used  Substance and Sexual Activity  . Alcohol use: No  . Drug use: No  . Sexual activity: Not on file  Other Topics Concern  . Not on file  Social History Narrative  . Not on file   Social Determinants of Health   Financial Resource Strain: Not on file  Food Insecurity: Not on file  Transportation Needs: Not on file  Physical Activity: Not on file  Stress: Not on file  Social Connections: Not on file     Family History: No family history of heart disease  ROS:   Please see the history of present illness.    All other systems reviewed and are negative.  EKGs/Labs/Other Studies Reviewed:    The following studies were reviewed today:  EKG:  EKG is ordered today.  The ekg ordered today demonstrates sinus rhythm, rate 93, PVCs  Recent Labs: 04/05/2020: TSH 1.010 07/23/2020: ALT 19 08/07/2020: Hemoglobin 14.8; Platelets 149 10/01/2020: BUN 13; Creatinine, Ser 0.81; Magnesium 2.0; Potassium 3.7; Sodium 142  Recent Lipid Panel    Component Value Date/Time   CHOL 126 06/05/2020 1022   TRIG 146 06/05/2020 1022   HDL 35 (L) 06/05/2020 1022   CHOLHDL 3.6 06/05/2020 1022   LDLCALC 65 06/05/2020 1022    Physical Exam:    VS:  BP 114/86 (BP Location: Left Arm, Patient Position: Sitting, Cuff Size: Large)   Pulse 62   Ht 6' (1.829 m)   Wt 295 lb 12.8 oz (134.2 kg)   BMI 40.12 kg/m     Wt Readings from Last 3  Encounters:  01/02/21 295 lb 12.8 oz (134.2 kg)  10/01/20 (!) 305 lb (138.3 kg)  07/23/20 (!) 302 lb (137 kg)     GEN:  Well nourished, well developed in no acute distress HEENT: Normal NECK: No JVD LYMPHATICS: No lymphadenopathy CARDIAC: RRR, no murmurs, rubs, gallops RESPIRATORY:  Clear to auscultation without rales, wheezing or rhonchi  ABDOMEN: Soft, non-tender, non-distended MUSCULOSKELETAL:  No edema; No deformity  SKIN: Warm and  dry NEUROLOGIC:  Alert and oriented x 3 PSYCHIATRIC:  Normal affect    Cardiac monitor 08/21/19:  Frequent ventricular ectopy (23% of beats)  120 episodes of SVT, longest lasting 59 seconds at 152 bpm  One 4 beat episode of NSVT  Patient triggered events corresponded to sinus rhythm with ventricular ectopy, incluging bigeminy   4 days of data recorded on Zio monitor. Patient had a min HR of 46 bpm, max HR of 207 bpm, and avg HR of 77 bpm. Predominant underlying rhythm was Sinus Rhythm. No atrial fibrillation, high degree block, or pauses noted. There was one 4 beat run of NSVT and 120 runs of SVT, longest lasting 59 seconds at 152 bpm.  Isolated atrial was ectopy was rare (<1%).  Very frequent ventricular ectopy (23% of beats).  Episodes of bigeminy (longest lasting 132 seconds) and trigeminy (longest lasting 98 seconds).  There were 2 triggered events, corresponding to sinus rhythm with ventricular bigeminy.   No significant arrhythmias detected.  CTA chest 08/16/19: Aneurysmal dilatation of the ascending thoracic aorta up to 47 mm. Recommend semi-annual imaging followup by CTA or MRA and referral to cardiothoracic surgery if not already obtained. This recommendation follows 2010 ACCF/AHA/AATS/ACR/ASA/SCA/SCAI/SIR/STS/SVM Guidelines for the Diagnosis and Management of Patients With Thoracic Aortic Disease. Circulation. 2010; 121: Z610-R604: E266-e369. Aortic aneurysm NOS (ICD10-I71.9)   Lexiscan Myoview 08/18/2019:  The study is normal.  This is a low  risk study.   Normal pharmacologic nuclear stress test with no evidence for prior infarct or ischemia. LVEF not calculated. Frequent PVCs.  TTE 07/28/19:  1. Left ventricular ejection fraction, by visual estimation, is 50 to 55%. The left ventricle has low normal function. There is mildly increased left ventricular hypertrophy.  2. Mildly dilated left ventricular internal cavity size.  3. The left ventricle demonstrates regional wall motion abnormalities. Septal hypokinesis  4. Global right ventricle has normal systolic function.The right ventricular size is normal. No increase in right ventricular wall thickness.  5. Left atrial size was normal.  6. Right atrial size was normal.  7. The mitral valve is normal in structure. No evidence of mitral valve regurgitation.  8. The tricuspid valve is normal in structure. Tricuspid valve regurgitation is not demonstrated.  9. The aortic valve is tricuspid. Aortic valve regurgitation is not visualized. No evidence of aortic valve sclerosis or stenosis. 10. The inferior vena cava is normal in size with greater than 50% respiratory variability, suggesting right atrial pressure of 3 mmHg. 11. Aneurysm of the ascending aorta, measuring 48 mm.  ASSESSMENT:    No diagnosis found. PLAN:    Hypertension: Previously was on amlodipine 5 mg daily, carvedilol 25 mg twice daily, spironolactone 25 mg daily, and azilsartan-chlorthalidone 40-25 mg.  Given aortic aneurysm, recommend goal SBP less than 120.  Has improved significantly since starting CPAP and with weight loss, have been able to cut back on regimen.   -Currently on azilsartan-chlorthalidone 40-25 mg daily, carvedilol 25 mg BID.  Appears controlled.  Diastolic BP mildly elevated, he reports he has not been compliant with his CPAP recently.  Encourage CPAP compliance  PVCs:  frequent PVCs (23% of beats).  Symptomatic episodes on monitor appear to correspond to episodes of bigeminy.   TTE shows low normal  systolic function (EF ~50%), septal hypokinesis.  No evidence of ischemia on Myoview.  Seen by EP, Dr Ladona Ridgelaylor recommended titrating up coreg, with repeat monitor in 11/11/2019 showed significant improvement in PVC burden (5.3% of beats). -Significantly improved with carvedilol, will continue  carvedilol 25 mg twice daily -Given frequent PVCs and echo did show septal hypokinesis, recommend evaluating for cardiac sarcoid.  Will order cardiac MRI  Aortic aneurysm: 4.7 cm ascending aortic aneurysm on CTA chest 12/18.  Repeat CTA chest 12/09/19 showed stable aneurysm, measured 4.5 cm.  Follows with Dr. Vickey Sages in cardiothoracic surgery, 21-month follow-up CT on 06/11/2020 showed stable aneurysm at 4.7 cm.  Planning f/u CT in 05/2021  CAD: Reports atypical chest pain.  Given frequent PVCs and hypokinesis seen on TTE, Lexiscan Myoview was done on 08/18/19, which showed no evidence of ischemia.  Coronary CTA on 12/08/2019 showed minimal nonobstructive CAD in mid LAD, calcium score 19 (84th percentile). -Continue rosuvastatin 10 mg daily.  LDL at goal less than 70  Hyperlipidemia: LDL 113 on 01/31/2020.  Started rosuvastatin 10 mg daily, LDL 65 on 06/05/2020.  OSA: Encouraged compliance with CPAP  T2DM: On Ozempic, A1c 6.6% on 04/05/2020  RTC in 3 months   Medication Adjustments/Labs and Tests Ordered: Current medicines are reviewed at length with the patient today.  Concerns regarding medicines are outlined above.  No orders of the defined types were placed in this encounter.  No orders of the defined types were placed in this encounter.   There are no Patient Instructions on file for this visit.   Signed, Little Ishikawa, MD  01/02/2021 10:09 AM    [facility]

## 2021-01-04 ENCOUNTER — Other Ambulatory Visit: Payer: Self-pay | Admitting: *Deleted

## 2021-01-04 DIAGNOSIS — I493 Ventricular premature depolarization: Secondary | ICD-10-CM

## 2021-01-04 DIAGNOSIS — I429 Cardiomyopathy, unspecified: Secondary | ICD-10-CM

## 2021-01-04 DIAGNOSIS — I10 Essential (primary) hypertension: Secondary | ICD-10-CM

## 2021-01-04 MED ORDER — POTASSIUM CHLORIDE CRYS ER 20 MEQ PO TBCR: 20.0000 meq | EXTENDED_RELEASE_TABLET | Freq: Every day | ORAL | 3 refills | Status: DC

## 2021-01-17 ENCOUNTER — Telehealth: Payer: Self-pay | Admitting: Cardiology

## 2021-01-17 ENCOUNTER — Other Ambulatory Visit: Payer: Self-pay | Admitting: *Deleted

## 2021-01-17 ENCOUNTER — Encounter: Payer: Self-pay | Admitting: *Deleted

## 2021-01-17 DIAGNOSIS — I429 Cardiomyopathy, unspecified: Secondary | ICD-10-CM

## 2021-01-17 DIAGNOSIS — I10 Essential (primary) hypertension: Secondary | ICD-10-CM

## 2021-01-17 DIAGNOSIS — I493 Ventricular premature depolarization: Secondary | ICD-10-CM

## 2021-01-17 NOTE — Telephone Encounter (Signed)
Left message for patient to call and discuss scheduling preferences for the Cardiac MRI ordered by Dr. Allyson Sabal

## 2021-01-22 NOTE — Telephone Encounter (Signed)
Spoke with patient regarding scheduling preferences for the Cardiac MRI ordered by Dr. Andee Poles also states he will need something for anxiety since he will be in the MRI machine for a long period of time

## 2021-01-22 NOTE — Telephone Encounter (Signed)
Routed to MD/RN regarding anxiety med request for test

## 2021-01-23 LAB — BASIC METABOLIC PANEL
BUN/Creatinine Ratio: 9 (ref 9–20)
BUN: 9 mg/dL (ref 6–24)
CO2: 28 mmol/L (ref 20–29)
Calcium: 8.8 mg/dL (ref 8.7–10.2)
Chloride: 100 mmol/L (ref 96–106)
Creatinine, Ser: 0.98 mg/dL (ref 0.76–1.27)
Glucose: 104 mg/dL — ABNORMAL HIGH (ref 65–99)
Potassium: 3.8 mmol/L (ref 3.5–5.2)
Sodium: 141 mmol/L (ref 134–144)
eGFR: 93 mL/min/{1.73_m2} (ref >59)

## 2021-01-23 NOTE — Telephone Encounter (Signed)
We can ativan 0.5 mg prior to MRI

## 2021-01-24 ENCOUNTER — Encounter: Payer: Self-pay | Admitting: Cardiology

## 2021-01-24 ENCOUNTER — Telehealth: Payer: Self-pay | Admitting: Cardiology

## 2021-01-24 NOTE — Telephone Encounter (Signed)
Returned call to patient who was calling for lab results.   Made patient aware of the following:   Little Ishikawa, MD  01/24/2021  7:06 AM EDT      Normal labs   Patient would like to know if he should still continue on the Potassium daily. Advised patient that I would forward message to Dr. Bjorn Pippin. Patient verbalized understanding.

## 2021-01-24 NOTE — Telephone Encounter (Signed)
Spoke with patient regarding the Monday 02/25/21 2:00 pm Cardiac MRI appointment at [facility]--arrival time is 1:30 pm at the 1st floor admissions office for check in---will mail information to patient and it is also available  in My Chart.  Patient voiced his understanding.

## 2021-01-24 NOTE — Telephone Encounter (Signed)
PT is returning a call 

## 2021-01-24 NOTE — Telephone Encounter (Signed)
Left message to call back about pre-med for MRI and lab results

## 2021-01-25 NOTE — Telephone Encounter (Signed)
Labs look good.  Can we ask him to check his BP twice daily for next week and call us with results?

## 2021-01-25 NOTE — Telephone Encounter (Signed)
Yes would continue potassium supplement

## 2021-01-28 NOTE — Telephone Encounter (Signed)
Left message to call back  

## 2021-01-28 NOTE — Telephone Encounter (Signed)
See my chart encounter.

## 2021-02-01 ENCOUNTER — Other Ambulatory Visit: Payer: Self-pay | Admitting: Cardiology

## 2021-02-17 IMAGING — CT CT ANGIO CHEST
2 of 6 series · 13 of 36 positions shown · IV contrast (omnipaque)
Comparison: CTA of the chest on 08/16/2019 and coronary CTA
performed earlier today.

CLINICAL DATA: Aneurysmal dilatation of the ascending thoracic
aorta.

EXAM:
CT ANGIOGRAPHY CHEST WITH CONTRAST
TECHNIQUE: Multidetector CT imaging of the chest was performed using the
standard protocol during bolus administration of intravenous
contrast. Multiplanar CT image reconstructions and MIPs were
obtained to evaluate the vascular anatomy.
CONTRAST:  200mL OMNIPAQUE IOHEXOL 350 MG/ML SOLN

[Series 8: axial arterial cta thorax 2.00 · axial · arterial · 0.71mm/px · z∈[-1274,-968]mm · 12 of 181 slices shown]
[im 14/181  lung]
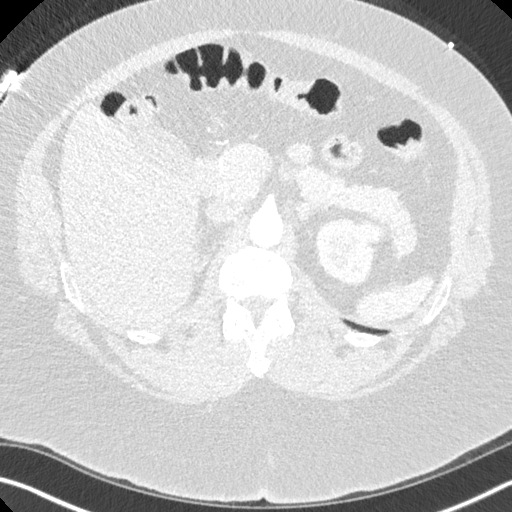
[im 28/181  mediastinal]
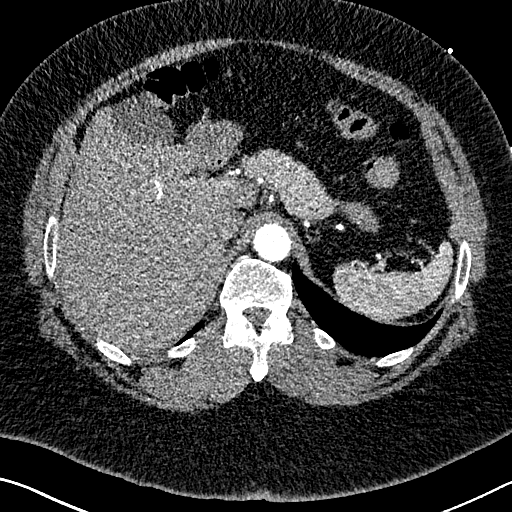
[im 42/181  lung]
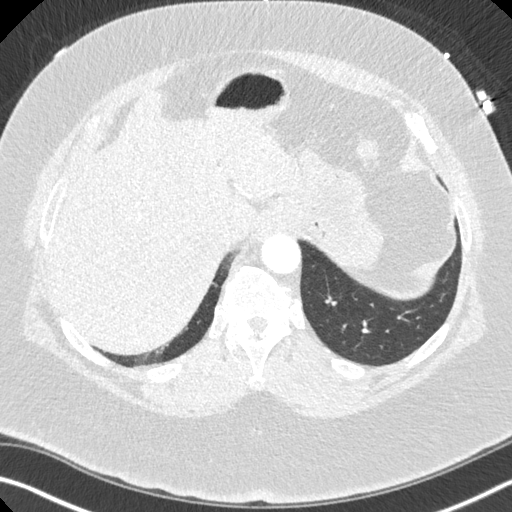
[im 56/181  mediastinal]
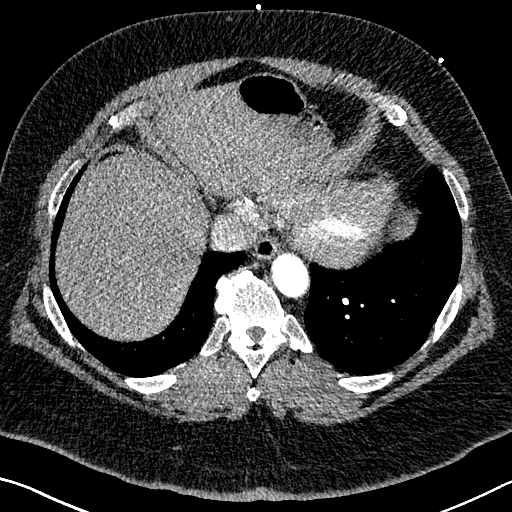
[im 70/181  lung]
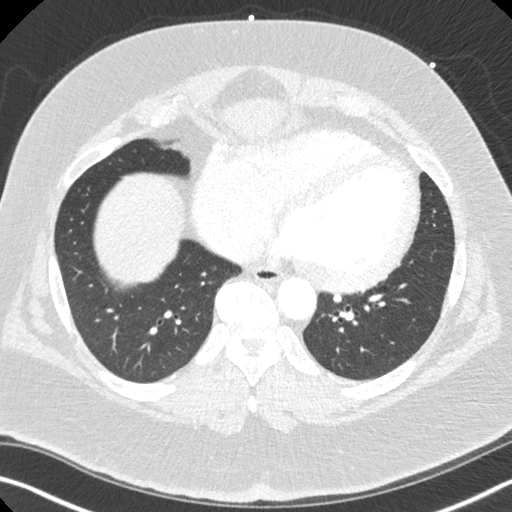
[im 84/181  mediastinal]
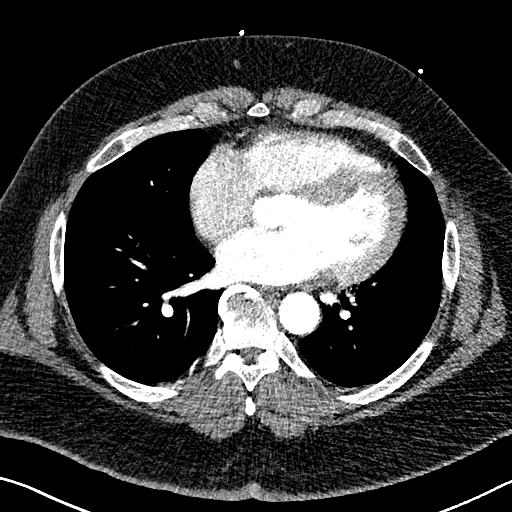
[im 97/181  lung]
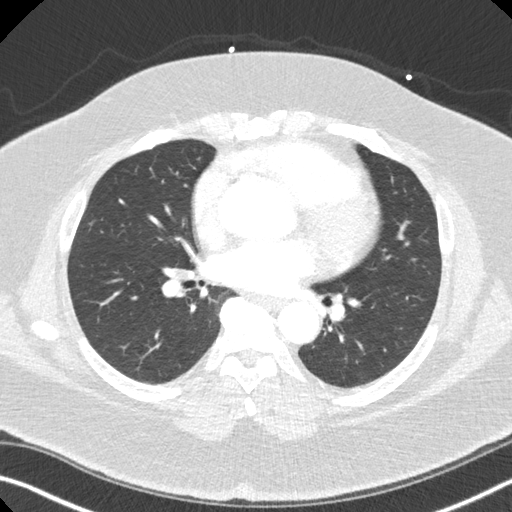
[im 111/181  mediastinal]
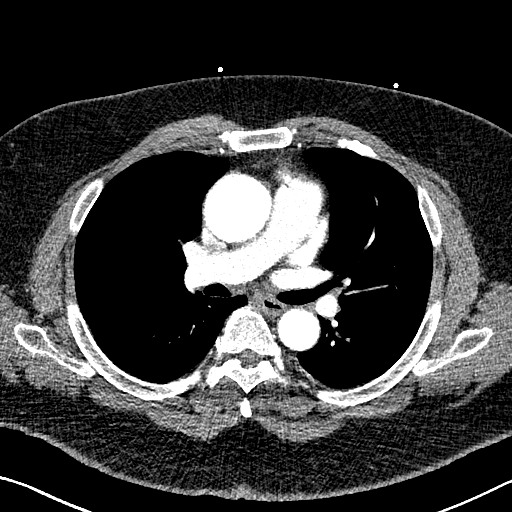
[im 125/181  lung]
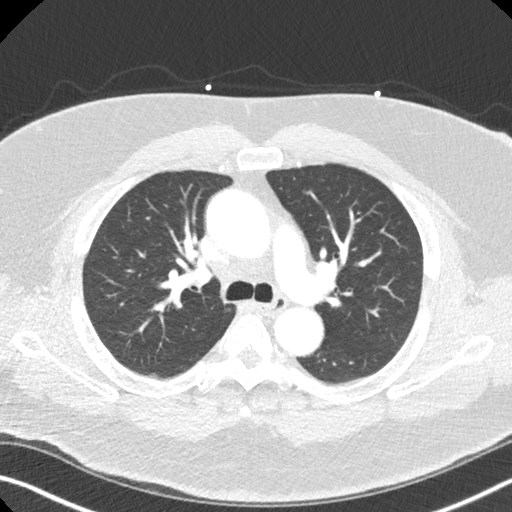
[im 139/181  mediastinal]
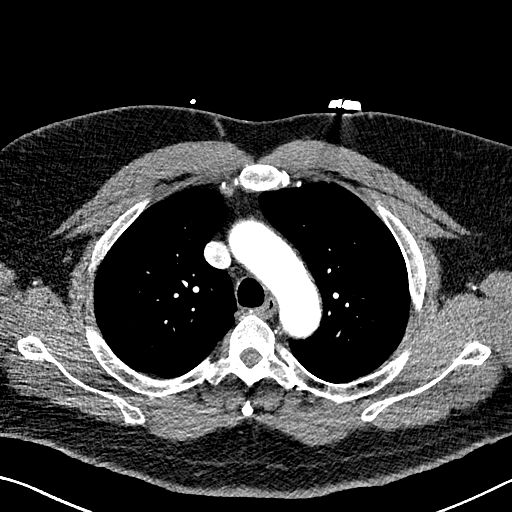
[im 153/181  lung]
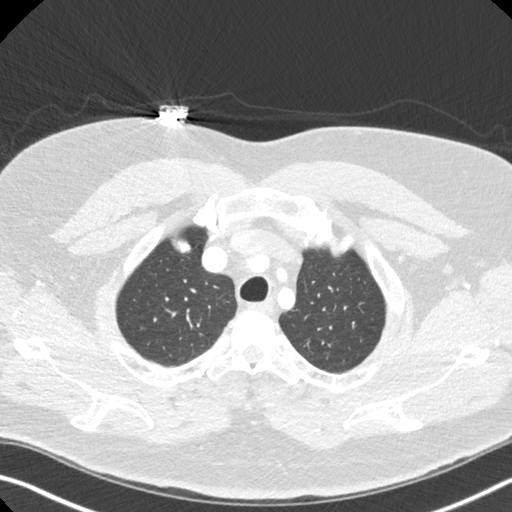
[im 167/181  mediastinal]
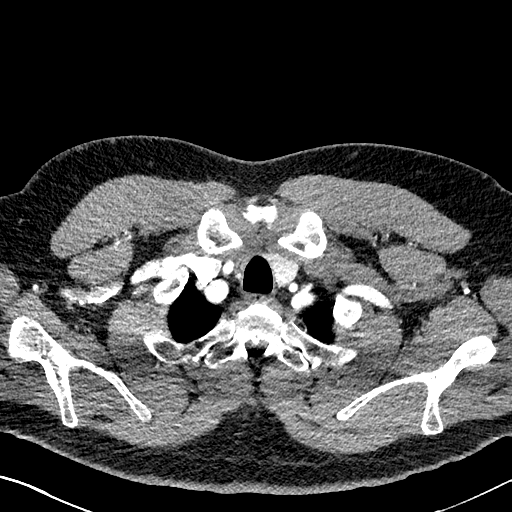

[Series 11: cor st cta thorax 2.00 cor · coronal · 0.71mm/px · 1 of 182 slices shown]
[im 91/182  mediastinal]
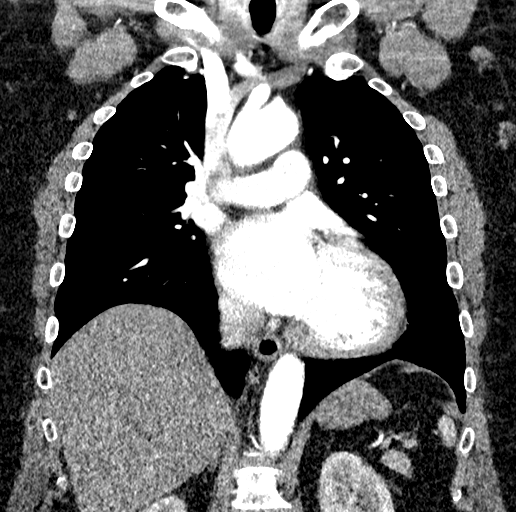

[13 of 36 positions shown; findings below may reference images not displayed]

FINDINGS: Cardiovascular: There is some variability in estimated aortic
diameter on prior CTA studies as well as the gated coronary CTA
performed earlier today based on how the measurement is obtained
relative to the axis of the thoracic aorta. The aortic root measures
approximately 4 cm at the level of the sinuses of Valsalva. The
ascending thoracic aorta on the non-gated study measures
approximately 4.4-4.5 cm in greatest diameter. An estimate of 4.7 cm
is felt to be slightly overestimated and likely incorporates some of
the long axis of the course of the aorta. The proximal arch measures
approximately 3.5 cm and the distal arch 3.3 cm. The descending
thoracic aorta measures 2.9 cm. No significant visible
atherosclerosis of the aorta. There is no evidence of aortic
dissection. Visualized proximal great vessels demonstrate normal
patency and branching pattern.

Overall heart size is normal. No pericardial fluid identified.
Central pulmonary arteries are normal in caliber. No vascular
anomalies.

Mediastinum/Nodes: No enlarged mediastinal, hilar, or axillary lymph
nodes. Thyroid gland, trachea, and esophagus demonstrate no
significant findings.

Lungs/Pleura: There is no evidence of pulmonary edema,
consolidation, pneumothorax, nodule or pleural fluid.

Upper Abdomen: Visualized upper abdomen is unremarkable.

Musculoskeletal: No bony abnormalities identified.

Review of the MIP images confirms the above findings.
IMPRESSION: 1. There is some variability in estimated aortic diameter on prior
CTA studies as well as the gated coronary CTA performed earlier
today based on how the measurement is obtained relative to the axis
of the thoracic aorta. The ascending thoracic aorta on the nongated
study measures approximately 4.4-4.5 cm in greatest diameter. An
estimate of 4.7 cm is felt to be slightly overestimated and likely
incorporates some of the long axis of the course of the aorta.
2. Recommend annual imaging followup by CTA or MRA. This
recommendation follows 7303
ACCF/AHA/AATS/ACR/ASA/SCA/HENRIETT/ADELL/SANCEZ/TEMAJ Guidelines for the
Diagnosis and Management of Patients with Thoracic Aortic Disease.
Circulation. 7303; 121: E266-e369. Aortic aneurysm NOS (GRC95-HEY.4)

Aortic aneurysm NOS (GRC95-HEY.4).

## 2021-02-22 ENCOUNTER — Other Ambulatory Visit: Payer: Self-pay | Admitting: *Deleted

## 2021-02-22 ENCOUNTER — Telehealth (HOSPITAL_COMMUNITY): Payer: Self-pay | Admitting: *Deleted

## 2021-02-22 MED ORDER — LORAZEPAM 0.5 MG PO TABS: 0.5000 mg | ORAL_TABLET | Freq: Once | ORAL | 0 refills | Status: AC

## 2021-02-22 NOTE — Telephone Encounter (Signed)
Reaching out to patient to offer assistance regarding upcoming cardiac imaging study; pt verbalizes understanding of appt date/time, parking situation and where to check in, and verified current allergies; name and call back number provided for further questions should they arise  Larey Brick RN Navigator Cardiac Imaging [facility] [phone] office [phone] cell  Patient requesting medication for claustrophobia. Dr. Bjorn Pippin made aware and will call medication in for patient.

## 2021-02-25 ENCOUNTER — Ambulatory Visit (HOSPITAL_COMMUNITY)
Admission: RE | Admit: 2021-02-25 | Discharge: 2021-02-25 | Disposition: A | Discharge status: Home / Self Care | Payer: Medicare Other | Source: Ambulatory Visit | Attending: Cardiology | Admitting: Cardiology

## 2021-02-25 ENCOUNTER — Telehealth: Payer: Self-pay | Admitting: Cardiology

## 2021-02-25 ENCOUNTER — Other Ambulatory Visit: Payer: Self-pay

## 2021-02-25 DIAGNOSIS — I493 Ventricular premature depolarization: Secondary | ICD-10-CM

## 2021-02-25 DIAGNOSIS — I429 Cardiomyopathy, unspecified: Secondary | ICD-10-CM

## 2021-02-25 MED ORDER — GADOBUTROL 1 MMOL/ML IV SOLN
10.0000 mL | Freq: Once | INTRAVENOUS | Status: AC | PRN
  Administered 2021-02-25: 10 mL via INTRAVENOUS

## 2021-02-25 NOTE — Telephone Encounter (Signed)
Pt c/o BP issue: STAT if pt c/o blurred vision, one-sided weakness or slurred speech  1. What are your last 5 BP readings?  02/25/21: 123/81 02/24/21:  9:30 am:  123/81   10:30 am: 107/76 11:30 am: 87/62   10:00 pm: 95/68   2. Are you having any other symptoms (ex. Dizziness, headache, blurred vision, passed out)? Patient said he felt "loopy" when it was low   3. What is your BP issue? Patient said he has an MRI today and is nervous about taking the medication and having his BP go low    Patient said he only took half of his meds yesterday and his BP went low

## 2021-02-25 NOTE — Telephone Encounter (Signed)
See phone note

## 2021-02-25 NOTE — Telephone Encounter (Signed)
Spoke to patient . Patient states his blood pressure  numbers have been  low . Starting this past Thursday July 7,2022 was   100 /59 That night he took  full dose  Azilsartan-Chlorthalidone 40/25 mg. Per patient,blood pressure was low on Friday and feeling dizzy -  he did not take medication on Saturday   blood pressure all day was 117/73. Sunday   patient states he took 1/2 dose of Azilsartan/chlorthalidone 40/25mg . Blood pressure 121/84  Patient staes  he is drinking plent fluids and he not active outside.  Patient sates this has happened  in the past .  He had to stop medication for a while and was placed on Losartan  - Losartan did not control blood pressure - patient was placed back on  Azilsartan - chlorthalidone. RN dose not know the time frame for changes .  Patient states he has not taken medication today. Due to the fact he dose not want a low blood pressure while doing MRI today patient states he has been taking carvedilol twice a day  every day . Patient states he will monitor and take medication accordingly.

## 2021-03-09 ENCOUNTER — Other Ambulatory Visit: Payer: Self-pay | Admitting: Cardiology

## 2021-04-01 NOTE — Progress Notes (Signed)
Cardiology Office Note:    Date:  04/05/2021   ID:  Ricky [name], DOB [birth year], MRN [id number]  PCP:  Maryagnes Amos, FNP  Cardiologist:  Little Ishikawa, MD  Electrophysiologist:  None   Referring MD: Maryagnes Amos   Chief Complaint  Patient presents with   Hypertension     History of Present Illness:    Ricky [name] is a 50 y.o. male with a hx of frequent PVCs, thoracic aortic aneurysm, OSA hypertension, thrombocytopenia who presents for follow-up.  He was initially seen on 07/22/2019 after he was referred by Caryn Bee, FNP for an evaluation of PVCs.  Had an ED visit on 07/10/2019 with chest pain/body aches and lightheadedness.  EKG showed bigeminy. TTE was done on 07/28/2019 which showed low normal systolic function (EF 50 to 55%), septal hypokinesis.  Also was notable for ascending aortic aneurysm measuring up to 48 mm.  Underwent CTA chest on 08/16/2019, which showed ascending aortic aneurysm measuring up to 97mm.  Cardiac monitor showed frequent PVCs (23% of beats) and 120 episodes of SVT, lasting up to 1 minute.  Lexiscan Myoview on 08/18/2019 showed no evidence of ischemia.  Diagnosed with OSA.  He was referred to Dr. Ladona Ridgel in EP for evaluation of his PVCs, recommended uptitrating his beta-blocker as initial treatment and repeat monitor once on Coreg 25 mg twice daily.  Repeat monitor in 11/11/2019 showed significant improvement in PVC burden (5.3% of beats).  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  Cardiac MRI on 02/25/2021 showed no LGE, EF 53%, dilated aortic root measuring 47 mm, normal RV function.  Since last clinic visit, he was in the ED at [facility] last week with diarrhea/abdominal pain.  Subsequently has been constipated.  Still not eating much.  Reports BP has been well controlled.  Denies any chest pain, dyspnea, headedness, syncope, lower extremity edema, or  palpitations.    BP Readings from Last 3 Encounters:  04/04/21 120/78  01/02/21 114/86  11/01/20 102/80    Wt Readings from Last 3 Encounters:  04/04/21 290 lb (131.5 kg)  01/02/21 295 lb 12.8 oz (134.2 kg)  10/01/20 (!) 305 lb (138.3 kg)      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   Eval Dr Trey Sailors, Neurosurgery 10/13   Chest pain    Depression    High cholesterol    Hypertension    Osteoarthritis    Osteoarthritis    Overweight    SOB (shortness of breath)    Thrombocytopenia (HCC) 07/07/2012   138,000 08/26/11; 98,000 06/29/12    Past Surgical History:  Procedure Laterality Date   ADENOIDECTOMY     ANTERIOR FUSION CERVICAL SPINE     APPENDECTOMY     HERNIA REPAIR     IRRIGATION AND DEBRIDEMENT SEBACEOUS CYST     on scalp   KNEE ARTHROSCOPY     x2   LUMBAR FUSION     POSTERIOR FUSION CERVICAL SPINE     TONSILLECTOMY      Current Medications: Current Meds  Medication Sig   Azilsartan-Chlorthalidone 40-25 MG TABS Take 1 tablet by mouth daily.   Blood Pressure Monitoring (BLOOD PRESSURE CUFF) MISC XL Adult blood pressure cuff   carvedilol (COREG) 25 MG tablet Take 1 tablet (25 mg total) by mouth 2 (two) times daily with a meal.  cyclobenzaprine (FLEXERIL) 10 MG tablet Take 1 tablet (10 mg total) by mouth 2 (two) times daily as needed for muscle spasms.   dicyclomine (BENTYL) 20 MG tablet Take 1 tablet (20 mg total) by mouth 2 (two) times daily between meals as needed for spasms.   Insulin Pen Needle 32G X 4 MM MISC Use to inject Ozempic once weekly   omeprazole (PRILOSEC) 20 MG capsule Take 1 capsule (20 mg total) by mouth daily.   potassium chloride SA (KLOR-CON) 20 MEQ tablet Take 1 tablet (20 mEq total) by mouth daily.   rosuvastatin (CRESTOR) 10 MG tablet TAKE 1 TABLET(10 MG) BY MOUTH DAILY   Semaglutide,0.25 or 0.5MG /DOS, (OZEMPIC, 0.25 OR 0.5 MG/DOSE,) 2 MG/1.5ML SOPN Inject 0.5 mg into  the skin once a week.   [DISCONTINUED] sildenafil (VIAGRA) 100 MG tablet TAKE 1/2 TO 1 TABLET BY MOUTH AS NEEDED. Defer further refills to PCP.     Allergies:   Patient has no known allergies.   Social History   Socioeconomic History   Marital status: Single    Spouse name: Not on file   Number of children: Not on file   Years of education: Not on file   Highest education level: Not on file  Occupational History   Occupation: Disabled  Tobacco Use   Smoking status: Former    Packs/day: 0.50    Years: 6.00    Pack years: 3.00    Types: Cigarettes    Quit date: 2013    Years since quitting: 9.6   Smokeless tobacco: Never  Substance and Sexual Activity   Alcohol use: No   Drug use: No   Sexual activity: Not on file  Other Topics Concern   Not on file  Social History Narrative   Not on file   Social Determinants of Health   Financial Resource Strain: Not on file  Food Insecurity: Not on file  Transportation Needs: Not on file  Physical Activity: Not on file  Stress: Not on file  Social Connections: Not on file     Family History: No family history of heart disease  ROS:   Please see the history of present illness.    All other systems reviewed and are negative.  EKGs/Labs/Other Studies Reviewed:    The following studies were reviewed today:  EKG:   04/04/21: NSR, rate 50, no PVCs 01/02/2021- The ekg ordered today demonstrates normal sinus rhythm, rate 62, no PVC's  10/01/2020- The ekg ordered demonstrates sinus rhythm, rate 93, PVCs  Recent Labs: 04/05/2020: TSH 1.010 07/23/2020: ALT 19 01/02/2021: Hemoglobin 15.0; Platelets 155 04/04/2021: BUN 12; Creatinine, Ser 0.79; Magnesium 2.1; Potassium 3.9; Sodium 143  Recent Lipid Panel    Component Value Date/Time   CHOL 126 06/05/2020 1022   TRIG 146 06/05/2020 1022   HDL 35 (L) 06/05/2020 1022   CHOLHDL 3.6 06/05/2020 1022   LDLCALC 65 06/05/2020 1022    Physical Exam:    VS:  BP 120/78   Pulse (!) 50    Ht 6' (1.829 m)   Wt 290 lb (131.5 kg)   SpO2 98%   BMI 39.33 kg/m     Wt Readings from Last 3 Encounters:  04/04/21 290 lb (131.5 kg)  01/02/21 295 lb 12.8 oz (134.2 kg)  10/01/20 (!) 305 lb (138.3 kg)     GEN:  Well nourished, well developed in no acute distress HEENT: Normal NECK: No JVD CARDIAC: RRR, no murmurs, rubs, gallops RESPIRATORY:  Clear to auscultation without rales,  wheezing or rhonchi  ABDOMEN: Soft, non-tender, non-distended MUSCULOSKELETAL:  No edema; No deformity  SKIN: Warm and dry NEUROLOGIC:  Alert and oriented x 3 PSYCHIATRIC:  Normal affect    Cardiac monitor 08/21/19: Frequent ventricular ectopy (23% of beats) 120 episodes of SVT, longest lasting 59 seconds at 152 bpm One 4 beat episode of NSVT Patient triggered events corresponded to sinus rhythm with ventricular ectopy, incluging bigeminy   4 days of data recorded on Zio monitor. Patient had a min HR of 46 bpm, max HR of 207 bpm, and avg HR of 77 bpm. Predominant underlying rhythm was Sinus Rhythm. No atrial fibrillation, high degree block, or pauses noted. There was one 4 beat run of NSVT and 120 runs of SVT, longest lasting 59 seconds at 152 bpm.  Isolated atrial was ectopy was rare (<1%).  Very frequent ventricular ectopy (23% of beats).  Episodes of bigeminy (longest lasting 132 seconds) and trigeminy (longest lasting 98 seconds).  There were 2 triggered events, corresponding to sinus rhythm with ventricular bigeminy.   No significant arrhythmias detected.  CTA chest 08/16/19: Aneurysmal dilatation of the ascending thoracic aorta up to 47 mm. Recommend semi-annual imaging followup by CTA or MRA and referral to cardiothoracic surgery if not already obtained. This recommendation follows 2010 ACCF/AHA/AATS/ACR/ASA/SCA/SCAI/SIR/STS/SVM Guidelines for the Diagnosis and Management of Patients With Thoracic Aortic Disease. Circulation. 2010; 121: I297-L892. Aortic aneurysm NOS (ICD10-I71.9)   Lexiscan  Myoview 08/18/2019: The study is normal. This is a low risk study.   Normal pharmacologic nuclear stress test with no evidence for prior infarct or ischemia. LVEF not calculated. Frequent PVCs.  TTE 07/28/19:  1. Left ventricular ejection fraction, by visual estimation, is 50 to 55%. The left ventricle has low normal function. There is mildly increased left ventricular hypertrophy.  2. Mildly dilated left ventricular internal cavity size.  3. The left ventricle demonstrates regional wall motion abnormalities. Septal hypokinesis  4. Global right ventricle has normal systolic function.The right ventricular size is normal. No increase in right ventricular wall thickness.  5. Left atrial size was normal.  6. Right atrial size was normal.  7. The mitral valve is normal in structure. No evidence of mitral valve regurgitation.  8. The tricuspid valve is normal in structure. Tricuspid valve regurgitation is not demonstrated.  9. The aortic valve is tricuspid. Aortic valve regurgitation is not visualized. No evidence of aortic valve sclerosis or stenosis. 10. The inferior vena cava is normal in size with greater than 50% respiratory variability, suggesting right atrial pressure of 3 mmHg. 11. Aneurysm of the ascending aorta, measuring 48 mm.  ASSESSMENT:    1. Essential hypertension   2. Frequent PVCs   3. CAD in native artery   4. Thoracic aortic aneurysm without rupture (HCC)   5. Hyperlipidemia, unspecified hyperlipidemia type     PLAN:    Hypertension: Previously was on amlodipine 5 mg daily, carvedilol 25 mg twice daily, spironolactone 25 mg daily, and azilsartan-chlorthalidone 40-25 mg.  Given aortic aneurysm, recommend goal SBP less than 120.  Has improved significantly since starting CPAP and with weight loss, have been able to cut back on regimen.   -Currently on azilsartan-chlorthalidone 40-25 mg daily, carvedilol 25 mg BID.  Appears controlled. Will check BMET/magnesium  PVCs:   frequent PVCs (23% of beats).  Symptomatic episodes on monitor appear to correspond to episodes of bigeminy.   TTE shows low normal systolic function (EF ~50%), septal hypokinesis.  No evidence of ischemia on Myoview.  Seen by  EP, Dr Ladona Ridgel recommended titrating up coreg, with repeat monitor in 11/11/2019 showed significant improvement in PVC burden (5.3% of beats).  No evidence of sarcoidosis on cardiac MRI. -Significantly improved with carvedilol, will continue carvedilol 25 mg twice daily  Aortic aneurysm: 4.7 cm ascending aortic aneurysm on CTA chest 12/18.  Repeat CTA chest 12/09/19 showed stable aneurysm, measured 4.5 cm.  Follows with Dr. Vickey Sages in cardiothoracic surgery, 80-month follow-up CT on 06/11/2020 showed stable aneurysm at 4.7 cm.  Planning f/u CT in 05/2021  CAD: Reports atypical chest pain.  Given frequent PVCs and hypokinesis seen on TTE, Lexiscan Myoview was done on 08/18/19, which showed no evidence of ischemia.  Coronary CTA on 12/08/2019 showed minimal nonobstructive CAD in mid LAD, calcium score 19 (84th percentile). -Continue rosuvastatin 10 mg daily.  LDL at goal less than 70  Hyperlipidemia: LDL 113 on 01/31/2020.  Started rosuvastatin 10 mg daily, LDL 65 on 06/05/2020.  OSA: Encouraged compliance with CPAP  T2DM: On Ozempic, A1c 6.6% on 04/05/2020  RTC in 6 months    Medication Adjustments/Labs and Tests Ordered: Current medicines are reviewed at length with the patient today.  Concerns regarding medicines are outlined above.  Orders Placed This Encounter  Procedures   Basic metabolic panel   Magnesium   EKG 12-Lead    Meds ordered this encounter  Medications   sildenafil (VIAGRA) 100 MG tablet    Sig: TAKE 1/2 TO 1 TABLET BY MOUTH AS NEEDED. Defer further refills to PCP.    Dispense:  10 tablet    Refill:  0    Defer refills to PCP     Patient Instructions  Medication Instructions:  Your physician recommends that you continue on your current medications  as directed. Please refer to the Current Medication list given to you today.  *If you need a refill on your cardiac medications before your next appointment, please call your pharmacy*   Lab Work: BMET, Mag today  If you have labs (blood work) drawn today and your tests are completely normal, you will receive your results only by: MyChart Message (if you have MyChart) OR A paper copy in the mail If you have any lab test that is abnormal or we need to change your treatment, we will call you to review the results.  Follow-Up: At [facility], you and your health needs are our priority.  As part of our continuing mission to provide you with exceptional heart care, we have created designated Provider Care Teams.  These Care Teams include your primary Cardiologist (physician) and Advanced Practice Providers (APPs -  Physician Assistants and Nurse Practitioners) who all work together to provide you with the care you need, when you need it.  We recommend signing up for the patient portal called "MyChart".  Sign up information is provided on this After Visit Summary.  MyChart is used to connect with patients for Virtual Visits (Telemedicine).  Patients are able to view lab/test results, encounter notes, upcoming appointments, etc.  Non-urgent messages can be sent to your provider as well.   To learn more about what you can do with MyChart, go to [email or web address].    Your next appointment:   6 month(s)  The format for your next appointment:   In Person  Provider:   Epifanio Lesches, MD       Signed, Little Ishikawa, MD  04/05/2021 10:03 AM    [facility]

## 2021-04-04 ENCOUNTER — Encounter: Payer: Self-pay | Admitting: Cardiology

## 2021-04-04 ENCOUNTER — Other Ambulatory Visit: Payer: Self-pay

## 2021-04-04 ENCOUNTER — Other Ambulatory Visit: Payer: Self-pay | Admitting: Cardiology

## 2021-04-04 ENCOUNTER — Ambulatory Visit: Payer: Medicare Other | Admitting: Cardiology

## 2021-04-04 VITALS — BP 120/78 | HR 50 | Ht 72.0 in | Wt 290.0 lb

## 2021-04-04 DIAGNOSIS — I2510 Atherosclerotic heart disease of native coronary artery without angina pectoris: Secondary | ICD-10-CM

## 2021-04-04 DIAGNOSIS — I493 Ventricular premature depolarization: Secondary | ICD-10-CM | POA: Diagnosis not present

## 2021-04-04 DIAGNOSIS — I10 Essential (primary) hypertension: Secondary | ICD-10-CM | POA: Diagnosis not present

## 2021-04-04 DIAGNOSIS — I712 Thoracic aortic aneurysm, without rupture, unspecified: Secondary | ICD-10-CM

## 2021-04-04 DIAGNOSIS — E785 Hyperlipidemia, unspecified: Secondary | ICD-10-CM

## 2021-04-04 MED ORDER — SILDENAFIL CITRATE 100 MG PO TABS: ORAL_TABLET | ORAL | 0 refills | Status: AC

## 2021-04-04 NOTE — Patient Instructions (Signed)
Medication Instructions:  Your physician recommends that you continue on your current medications as directed. Please refer to the Current Medication list given to you today.  *If you need a refill on your cardiac medications before your next appointment, please call your pharmacy*   Lab Work: BMET, Mag today  If you have labs (blood work) drawn today and your tests are completely normal, you will receive your results only by: MyChart Message (if you have MyChart) OR A paper copy in the mail If you have any lab test that is abnormal or we need to change your treatment, we will call you to review the results.  Follow-Up: At [facility], you and your health needs are our priority.  As part of our continuing mission to provide you with exceptional heart care, we have created designated Provider Care Teams.  These Care Teams include your primary Cardiologist (physician) and Advanced Practice Providers (APPs -  Physician Assistants and Nurse Practitioners) who all work together to provide you with the care you need, when you need it.  We recommend signing up for the patient portal called "MyChart".  Sign up information is provided on this After Visit Summary.  MyChart is used to connect with patients for Virtual Visits (Telemedicine).  Patients are able to view lab/test results, encounter notes, upcoming appointments, etc.  Non-urgent messages can be sent to your provider as well.   To learn more about what you can do with MyChart, go to [email or web address].    Your next appointment:   6 month(s)  The format for your next appointment:   In Person  Provider:   Christopher Schumann, MD     

## 2021-04-05 LAB — BASIC METABOLIC PANEL
BUN/Creatinine Ratio: 15 (ref 9–20)
BUN: 12 mg/dL (ref 6–24)
CO2: 28 mmol/L (ref 20–29)
Calcium: 8.7 mg/dL (ref 8.7–10.2)
Chloride: 103 mmol/L (ref 96–106)
Creatinine, Ser: 0.79 mg/dL (ref 0.76–1.27)
Glucose: 97 mg/dL (ref 65–99)
Potassium: 3.9 mmol/L (ref 3.5–5.2)
Sodium: 143 mmol/L (ref 134–144)
eGFR: 108 mL/min/{1.73_m2} (ref >59)

## 2021-04-05 LAB — MAGNESIUM: Magnesium: 2.1 mg/dL (ref 1.6–2.3)

## 2021-04-30 ENCOUNTER — Telehealth: Payer: Self-pay

## 2021-04-30 NOTE — Telephone Encounter (Signed)
   [facility] Pre-operative Risk Assessment    Patient Name: Ricky [name]  DOB: 1971/03/30 MRN: [id number]  HEARTCARE STAFF:  - IMPORTANT!!!!!! Under Visit Info/Reason for Call, type in Other and utilize the format Clearance MM/DD/YY or Clearance TBD. Do not use dashes or single digits. - Please review there is not already an duplicate clearance open for this procedure. - If request is for dental extraction, please clarify the # of teeth to be extracted. - If the patient is currently at the dentist's office, call Pre-Op Callback Staff (MA/nurse) to input urgent request.  - If the patient is not currently in the dentist office, please route to the Pre-Op pool.  Request for surgical clearance:  What type of surgery is being performed? COLONOSCOPY  When is this surgery scheduled? 05-21-2021  What type of clearance is required (medical clearance vs. Pharmacy clearance to hold med vs. Both)? MEDICAL  Are there any medications that need to be held prior to surgery and how long? NONE LISTED  Practice name and name of physician performing surgery? [facility]  What is the office phone number? [phone]   7.   What is the office fax number? [phone]  8.   Anesthesia type (None, local, MAC, general) ? [phone]   Waylan Rocher 04/30/2021, 4:03 PM

## 2021-04-30 NOTE — Telephone Encounter (Signed)
   Name: Ricky [name]  DOB: July 12, 1971  MRN: [id number]   Primary Cardiologist: Little Ishikawa, MD  Chart reviewed as part of pre-operative protocol coverage. Patient was contacted 04/30/2021 in reference to pre-operative risk assessment for pending surgery as outlined below.  ROLLO FARQUHAR was last seen on 04/04/21 by Dr. Bjorn Pippin.  Since that day, DAANISH COPES has done well.  He can complete more than 4.0 METS without angina. He is being closely monitored for a thoracic aortic aneurysm and PVCs/PSVT.  Therefore, based on ACC/AHA guidelines, the patient would be at acceptable risk for the planned procedure without further cardiovascular testing.   The patient was advised that if he develops new symptoms prior to surgery to contact our office to arrange for a follow-up visit, and he verbalized understanding.  I will route this recommendation to the requesting party via Epic fax function and remove from pre-op pool. Please call with questions.  Marcelino Duster, PA 04/30/2021, 4:47 PM

## 2021-05-02 ENCOUNTER — Other Ambulatory Visit: Payer: Self-pay | Admitting: Cardiology

## 2021-05-06 ENCOUNTER — Other Ambulatory Visit: Payer: Self-pay | Admitting: Gastroenterology

## 2021-05-06 ENCOUNTER — Emergency Department (HOSPITAL_BASED_OUTPATIENT_CLINIC_OR_DEPARTMENT_OTHER): Payer: Medicare Other

## 2021-05-06 ENCOUNTER — Encounter (HOSPITAL_BASED_OUTPATIENT_CLINIC_OR_DEPARTMENT_OTHER): Payer: Self-pay | Admitting: Emergency Medicine

## 2021-05-06 ENCOUNTER — Inpatient Hospital Stay (HOSPITAL_BASED_OUTPATIENT_CLINIC_OR_DEPARTMENT_OTHER)
Admission: EM | Admit: 2021-05-06 | Discharge: 2021-05-10 | DRG: 390 | Disposition: A | Discharge status: Home / Self Care | Payer: Medicare Other | Source: Ambulatory Visit | Attending: Internal Medicine | Admitting: Internal Medicine

## 2021-05-06 ENCOUNTER — Other Ambulatory Visit: Payer: Self-pay

## 2021-05-06 ENCOUNTER — Ambulatory Visit
Admission: RE | Admit: 2021-05-06 | Discharge: 2021-05-06 | Disposition: A | Discharge status: Home / Self Care | Payer: Medicare Other | Source: Ambulatory Visit | Attending: Gastroenterology | Admitting: Gastroenterology

## 2021-05-06 DIAGNOSIS — K56600 Partial intestinal obstruction, unspecified as to cause: Principal | ICD-10-CM | POA: Diagnosis present

## 2021-05-06 DIAGNOSIS — G4733 Obstructive sleep apnea (adult) (pediatric): Secondary | ICD-10-CM | POA: Diagnosis present

## 2021-05-06 DIAGNOSIS — Z20822 Contact with and (suspected) exposure to covid-19: Secondary | ICD-10-CM | POA: Diagnosis present

## 2021-05-06 DIAGNOSIS — Z87891 Personal history of nicotine dependence: Secondary | ICD-10-CM | POA: Diagnosis not present

## 2021-05-06 DIAGNOSIS — E876 Hypokalemia: Secondary | ICD-10-CM | POA: Diagnosis present

## 2021-05-06 DIAGNOSIS — R7302 Impaired glucose tolerance (oral): Secondary | ICD-10-CM | POA: Diagnosis present

## 2021-05-06 DIAGNOSIS — Z83438 Family history of other disorder of lipoprotein metabolism and other lipidemia: Secondary | ICD-10-CM

## 2021-05-06 DIAGNOSIS — R6881 Early satiety: Secondary | ICD-10-CM | POA: Diagnosis present

## 2021-05-06 DIAGNOSIS — Z79899 Other long term (current) drug therapy: Secondary | ICD-10-CM

## 2021-05-06 DIAGNOSIS — Z833 Family history of diabetes mellitus: Secondary | ICD-10-CM | POA: Diagnosis not present

## 2021-05-06 DIAGNOSIS — E7800 Pure hypercholesterolemia, unspecified: Secondary | ICD-10-CM | POA: Diagnosis present

## 2021-05-06 DIAGNOSIS — M1990 Unspecified osteoarthritis, unspecified site: Secondary | ICD-10-CM | POA: Diagnosis present

## 2021-05-06 DIAGNOSIS — Z818 Family history of other mental and behavioral disorders: Secondary | ICD-10-CM | POA: Diagnosis not present

## 2021-05-06 DIAGNOSIS — I10 Essential (primary) hypertension: Secondary | ICD-10-CM | POA: Diagnosis present

## 2021-05-06 DIAGNOSIS — K56609 Unspecified intestinal obstruction, unspecified as to partial versus complete obstruction: Secondary | ICD-10-CM

## 2021-05-06 DIAGNOSIS — Z6837 Body mass index (BMI) 37.0-37.9, adult: Secondary | ICD-10-CM | POA: Diagnosis not present

## 2021-05-06 DIAGNOSIS — Z8249 Family history of ischemic heart disease and other diseases of the circulatory system: Secondary | ICD-10-CM

## 2021-05-06 DIAGNOSIS — Z981 Arthrodesis status: Secondary | ICD-10-CM | POA: Diagnosis not present

## 2021-05-06 LAB — CBC
HCT: 44.3 % (ref 39.0–52.0)
Hemoglobin: 14 g/dL (ref 13.0–17.0)
MCH: 28.5 pg (ref 26.0–34.0)
MCHC: 31.6 g/dL (ref 30.0–36.0)
MCV: 90.2 fL (ref 80.0–100.0)
Platelets: 118 10*3/uL — ABNORMAL LOW (ref 150–400)
RBC: 4.91 MIL/uL (ref 4.22–5.81)
RDW: 14.3 % (ref 11.5–15.5)
WBC: 5.5 10*3/uL (ref 4.0–10.5)
nRBC: 0 % (ref 0.0–0.2)

## 2021-05-06 LAB — CBC WITH DIFFERENTIAL/PLATELET
Abs Immature Granulocytes: 0 10*3/uL (ref 0.00–0.07)
Basophils Absolute: 0 10*3/uL (ref 0.0–0.1)
Basophils Relative: 0 %
Eosinophils Absolute: 0.1 10*3/uL (ref 0.0–0.5)
Eosinophils Relative: 1 %
HCT: 43.9 % (ref 39.0–52.0)
Hemoglobin: 14.1 g/dL (ref 13.0–17.0)
Lymphocytes Relative: 48 %
Lymphs Abs: 2.6 10*3/uL (ref 0.7–4.0)
MCH: 28.5 pg (ref 26.0–34.0)
MCHC: 32.1 g/dL (ref 30.0–36.0)
MCV: 88.9 fL (ref 80.0–100.0)
Monocytes Absolute: 0.5 10*3/uL (ref 0.1–1.0)
Monocytes Relative: 9 %
Neutro Abs: 2.3 10*3/uL (ref 1.7–7.7)
Neutrophils Relative %: 42 %
Platelets: 126 10*3/uL — ABNORMAL LOW (ref 150–400)
RBC: 4.94 MIL/uL (ref 4.22–5.81)
RDW: 14.3 % (ref 11.5–15.5)
WBC: 5.4 10*3/uL (ref 4.0–10.5)
nRBC: 0 % (ref 0.0–0.2)

## 2021-05-06 LAB — COMPREHENSIVE METABOLIC PANEL
ALT: 26 U/L (ref 0–44)
AST: 29 U/L (ref 15–41)
Albumin: 4.4 g/dL (ref 3.5–5.0)
Alkaline Phosphatase: 68 U/L (ref 38–126)
Anion gap: 8 (ref 5–15)
BUN: 15 mg/dL (ref 6–20)
CO2: 31 mmol/L (ref 22–32)
Calcium: 9.4 mg/dL (ref 8.9–10.3)
Chloride: 101 mmol/L (ref 98–111)
Creatinine, Ser: 0.94 mg/dL (ref 0.61–1.24)
GFR, Estimated: >60 mL/min (ref >60)
Glucose, Bld: 96 mg/dL (ref 70–99)
Potassium: 3.5 mmol/L (ref 3.5–5.1)
Sodium: 140 mmol/L (ref 135–145)
Total Bilirubin: 0.9 mg/dL (ref 0.3–1.2)
Total Protein: 7.4 g/dL (ref 6.5–8.1)

## 2021-05-06 LAB — LACTIC ACID, PLASMA
Lactic Acid, Venous: 1.3 mmol/L (ref 0.5–1.9)
Lactic Acid, Venous: 1 mmol/L (ref 0.5–1.9)

## 2021-05-06 LAB — RESP PANEL BY RT-PCR (FLU A&B, COVID) ARPGX2
Influenza A by PCR: NEGATIVE
Influenza B by PCR: NEGATIVE
SARS Coronavirus 2 by RT PCR: NEGATIVE

## 2021-05-06 MED ORDER — ACETAMINOPHEN 325 MG PO TABS: 650.0000 mg | ORAL_TABLET | Freq: Four times a day (QID) | ORAL | Status: DC | PRN

## 2021-05-06 MED ORDER — LACTATED RINGERS IV BOLUS
1000.0000 mL | Freq: Once | INTRAVENOUS | Status: AC
  Administered 2021-05-06: 1000 mL via INTRAVENOUS

## 2021-05-06 MED ORDER — ONDANSETRON HCL 4 MG/2ML IJ SOLN: 4.0000 mg | Freq: Four times a day (QID) | INTRAMUSCULAR | Status: DC | PRN

## 2021-05-06 MED ORDER — ENOXAPARIN SODIUM 60 MG/0.6ML IJ SOSY
60.0000 mg | PREFILLED_SYRINGE | INTRAMUSCULAR | Status: DC
  Administered 2021-05-07: 60 mg via SUBCUTANEOUS
  Filled 2021-05-06: qty 0.6

## 2021-05-06 MED ORDER — ACETAMINOPHEN 650 MG RE SUPP: 650.0000 mg | Freq: Four times a day (QID) | RECTAL | Status: DC | PRN

## 2021-05-06 MED ORDER — LACTATED RINGERS IV SOLN: INTRAVENOUS | Status: DC

## 2021-05-06 MED ORDER — ONDANSETRON HCL 4 MG PO TABS: 4.0000 mg | ORAL_TABLET | Freq: Four times a day (QID) | ORAL | Status: DC | PRN

## 2021-05-06 MED ORDER — IOHEXOL 350 MG/ML SOLN
75.0000 mL | Freq: Once | INTRAVENOUS | Status: AC | PRN
  Administered 2021-05-06: 75 mL via INTRAVENOUS

## 2021-05-06 NOTE — H&P (Signed)
History and Physical    Ricky [name] KVQ:[id number] DOB: Oct 15, 1970 DOA: 05/06/2021  PCP: Maryagnes Amos, FNP  Patient coming from: Home  I have personally briefly reviewed patient's old medical records in [facility] Link  Chief Complaint: Abd pain  HPI: Ricky [name] is a 50 y.o. male with medical history significant of obesity, HTN, HLD.  Pt with recent admission to [facility] for SBO 9/13-9/15.  Managed conservatively with just bowel rest.  Seemed to improve and discharged.  Pt presents to ED today with recurrent abd pain, distention.  Onset yesterday.  Last BM was yesterday.  No flatus today.  Nauseated, no vomiting.  Pain constant, gets up to 7/10 at times.  Saw GI today, had KUB, sent to ED.  ED Course: CT AP confirms persistent SBO with transition point in RLQ.   Review of Systems: As per HPI, otherwise all review of systems negative.  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   Eval Dr Trey Sailors, Neurosurgery 10/13   Chest pain    Depression    High cholesterol    Hypertension    Osteoarthritis    Osteoarthritis    Overweight    SOB (shortness of breath)    Thrombocytopenia (HCC) 07/07/2012   138,000 08/26/11; 98,000 06/29/12    Past Surgical History:  Procedure Laterality Date   ADENOIDECTOMY     ANTERIOR FUSION CERVICAL SPINE     APPENDECTOMY     HERNIA REPAIR     IRRIGATION AND DEBRIDEMENT SEBACEOUS CYST     on scalp   KNEE ARTHROSCOPY     x2   LUMBAR FUSION     POSTERIOR FUSION CERVICAL SPINE     TONSILLECTOMY       reports that he quit smoking about 9 years ago. His smoking use included cigarettes. He has a 3.00 pack-year smoking history. He has never used smokeless tobacco. He reports that he does not drink alcohol and does not use drugs.  No Known Allergies  Family History  Problem Relation Age of Onset   Diabetes Mother    Hypertension Mother    Hyperlipidemia Mother     Stroke Mother    Anxiety disorder Mother    Obesity Mother    Hypertension Father    Hyperlipidemia Father      Prior to Admission medications   Medication Sig Start Date End Date Taking? Authorizing Provider  Azilsartan-Chlorthalidone 40-25 MG TABS Take 1 tablet by mouth daily. 11/01/20   Little Ishikawa, MD  Blood Pressure Monitoring (BLOOD PRESSURE CUFF) MISC XL Adult blood pressure cuff 08/18/19   Little Ishikawa, MD  carvedilol (COREG) 25 MG tablet Take 1 tablet (25 mg total) by mouth 2 (two) times daily with a meal. 10/01/20   Little Ishikawa, MD  cyclobenzaprine (FLEXERIL) 10 MG tablet Take 1 tablet (10 mg total) by mouth 2 (two) times daily as needed for muscle spasms. 07/10/19   Alvira Monday, MD  dicyclomine (BENTYL) 20 MG tablet Take 1 tablet (20 mg total) by mouth 2 (two) times daily between meals as needed for spasms. 12/03/19   Lorelee New, PA-C  Insulin Pen Needle 32G X 4 MM MISC Use to inject Ozempic once weekly 09/25/20   Little Ishikawa, MD  omeprazole (PRILOSEC) 20 MG capsule Take 1 capsule (20 mg total) by mouth daily. 07/11/20   Little Ishikawa, MD  potassium  chloride SA (KLOR-CON) 20 MEQ tablet Take 1 tablet (20 mEq total) by mouth daily. 01/04/21 12/30/21  Little Ishikawa, MD  rosuvastatin (CRESTOR) 10 MG tablet TAKE 1 TABLET(10 MG) BY MOUTH DAILY 02/01/21   Little Ishikawa, MD  Semaglutide,0.25 or 0.5MG /DOS, (OZEMPIC, 0.25 OR 0.5 MG/DOSE,) 2 MG/1.5ML SOPN Inject 0.5 mg into the skin once a week.    [provider]  sildenafil (VIAGRA) 100 MG tablet TAKE 1/2 TO 1 TABLET BY MOUTH AS NEEDED. Defer further refills to PCP. 04/04/21   Little Ishikawa, MD    Physical Exam: Vitals:   05/06/21 1930 05/06/21 1945 05/06/21 2000 05/06/21 2052  BP: (!) 114/102 121/77 119/90 105/87  Pulse: (!) 52 (!) 59 (!) 46 (!) 50  Resp:   18 20  Temp:   98.3 F (36.8 C) 98.3 F (36.8 C)  TempSrc:   Oral Oral  SpO2:  100% 98% 99% 100%  Weight:      Height:        Constitutional: NAD, calm, comfortable Eyes: PERRL, lids and conjunctivae normal ENMT: Mucous membranes are moist. Posterior pharynx clear of any exudate or lesions.Normal dentition.  Neck: normal, supple, no masses, no thyromegaly Respiratory: clear to auscultation bilaterally, no wheezing, no crackles. Normal respiratory effort. No accessory muscle use.  Cardiovascular: Regular rate and rhythm, no murmurs / rubs / gallops. No extremity edema. 2+ pedal pulses. No carotid bruits.  Abdomen: Generalized TTP, distention Musculoskeletal: no clubbing / cyanosis. No joint deformity upper and lower extremities. Good ROM, no contractures. Normal muscle tone.  Skin: no rashes, lesions, ulcers. No induration Neurologic: CN 2-12 grossly intact. Sensation intact, DTR normal. Strength 5/5 in all 4.  Psychiatric: Normal judgment and insight. Alert and oriented x 3. Normal mood.    Labs on Admission: I have personally reviewed following labs and imaging studies  CBC: Recent Labs  Lab 05/06/21 1438  WBC 5.4  NEUTROABS 2.3  HGB 14.1  HCT 43.9  MCV 88.9  PLT 126*   Basic Metabolic Panel: Recent Labs  Lab 05/06/21 1438  NA 140  K 3.5  CL 101  CO2 31  GLUCOSE 96  BUN 15  CREATININE 0.94  CALCIUM 9.4   GFR: Estimated Creatinine Clearance: 126.1 mL/min (by C-G formula based on SCr of 0.94 mg/dL). Liver Function Tests: Recent Labs  Lab 05/06/21 1438  AST 29  ALT 26  ALKPHOS 68  BILITOT 0.9  PROT 7.4  ALBUMIN 4.4   No results for input(s): LIPASE, AMYLASE in the last 168 hours. No results for input(s): AMMONIA in the last 168 hours. Coagulation Profile: No results for input(s): INR, PROTIME in the last 168 hours. Cardiac Enzymes: No results for input(s): CKTOTAL, CKMB, CKMBINDEX, TROPONINI in the last 168 hours. BNP (last 3 results) No results for input(s): PROBNP in the last 8760 hours. HbA1C: No results for input(s): HGBA1C in  the last 72 hours. CBG: No results for input(s): GLUCAP in the last 168 hours. Lipid Profile: No results for input(s): CHOL, HDL, LDLCALC, TRIG, CHOLHDL, LDLDIRECT in the last 72 hours. Thyroid Function Tests: No results for input(s): TSH, T4TOTAL, FREET4, T3FREE, THYROIDAB in the last 72 hours. Anemia Panel: No results for input(s): VITAMINB12, FOLATE, FERRITIN, TIBC, IRON, RETICCTPCT in the last 72 hours. Urine analysis: No results found for: COLORURINE, APPEARANCEUR, LABSPEC, PHURINE, GLUCOSEU, HGBUR, BILIRUBINUR, KETONESUR, PROTEINUR, UROBILINOGEN, NITRITE, LEUKOCYTESUR  Radiological Exams on Admission: CT ABDOMEN PELVIS W CONTRAST  Result Date: 05/06/2021 CLINICAL DATA:  Bowel obstruction suspected.  EXAM: CT ABDOMEN AND PELVIS WITH CONTRAST TECHNIQUE: Multidetector CT imaging of the abdomen and pelvis was performed using the standard protocol following bolus administration of intravenous contrast. CONTRAST:  22mL OMNIPAQUE IOHEXOL 350 MG/ML SOLN COMPARISON:  CT Abdomen Pelvis, 04/30/2021 and 03/27/2021. FINDINGS: Lower chest: No acute abnormality. Hepatobiliary: No focal liver abnormality is seen. No gallstones, gallbladder wall thickening, or biliary dilatation. Pancreas: Unremarkable. No pancreatic ductal dilatation or surrounding inflammatory changes. Spleen: Normal in size without focal abnormality. Adrenals/Urinary Tract: Adrenal glands are unremarkable. Kidneys are normal, without renal calculi, focal lesion, or hydronephrosis. Bladder is unremarkable. Stomach/Bowel: Small hiatus hernia.  Stomach is decompressed. Persistence of air and-fluid distention of multiple small bowel loops, with focal transition within the RIGHT lower quadrant (# 2, 65/# 6, 69). No evidence of perforation, or overt vascular compromise. The appendix is not well visualized. Nondilated colon. Mild colonic diverticulosis. Vascular/Lymphatic: No significant vascular findings are present. No enlarged abdominal or pelvic  lymph nodes. Reproductive: Prostate is unremarkable. Other: Umbilical herniorrhaphy. No abdominal wall hernia or abnormality. No abdominopelvic ascites. Musculoskeletal: Small RIGHT anterior abdominal wall contusions, likely injection sites. No acute or significant osseous findings. Degenerative changes of the spine, including L4-L5 facet arthropathy and posterior fusion. IMPRESSION: Persistent small bowel obstruction with discrete transition within the RIGHT lower quadrant (see key images). No evidence of perforation or overt vascular compromise. Electronically Signed   By: Roanna Banning GlennD.   On: 05/06/2021 16:37   DG Abd 2 Views  Result Date: 05/06/2021 CLINICAL DATA:  Abdominal pain, bloating. EXAM: ABDOMEN - 2 VIEW COMPARISON:  None. FINDINGS: Dilated small bowel loops are noted with air-fluid levels concerning for distal small bowel obstruction. No colonic dilatation is noted. There is no evidence of free air. No radio-opaque calculi or other significant radiographic abnormality is seen. IMPRESSION: Dilated small bowel loops are noted with air-fluid levels concerning for distal small bowel obstruction. Electronically Signed   By: Lupita Raider GlennD.   On: 05/06/2021 11:16    EKG: Independently reviewed.  Assessment/Plan Principal Problem:   SBO (small bowel obstruction) (HCC) Active Problems:   Hypertension    SBO - NPO IVF NGT if he develops severe N/V, will hold off for the moment unless gen surg wants Sent message to Gen surg for non-emergent consult (either tonight or in AM). HTN - Home med rec pending  DVT prophylaxis: Lovenox Code Status: Full Family Communication: No family in room Disposition Plan: Home after SBO resolved Consults called: Message sent to Dr. Bedelia Person on secure chat Admission status: Admit to inpatient  Severity of Illness: The appropriate patient status for this patient is INPATIENT. Inpatient status is judged to be reasonable and necessary in order to  provide the required intensity of service to ensure the patient's safety. The patient's presenting symptoms, physical exam findings, and initial radiographic and laboratory data in the context of their chronic comorbidities is felt to place them at high risk for further clinical deterioration. Furthermore, it is not anticipated that the patient will be medically stable for discharge from the hospital within 2 midnights of admission. The following factors support the patient status of inpatient.   IP status for recurrent SBO.  * I certify that at the point of admission it is my clinical judgment that the patient will require inpatient hospital care spanning beyond 2 midnights from the point of admission due to high intensity of service, high risk for further deterioration and high frequency of surveillance required.*   Ricky Blanford M.  DO [facility]  How to contact the [facility] Attending or Consulting provider 7A - 7P or covering provider during after hours 7P -7A, for this patient?  Check the care team in [facility] and look for a) attending/consulting [facility] provider listed and b) the [facility] team listed Log into [email or web address]  [employer] Physician Scheduling and messaging for groups and whole hospitals  On call and physician scheduling software for group practices, residents, hospitalists and other medical providers for call, clinic, rotation and shift schedules. On[employer] is a hospital-wide system for scheduling doctors and paging doctors on call. EasyPlot is for scientific plotting and data analysis.  [email or web address]  and use [place]'s universal password to access. If you do not have the password, please contact the hospital operator.  Locate the [facility] provider you are looking for under [facility] and page to a number that you can be directly reached. If you still have difficulty reaching the provider, please page the [facility] (Director on Call) for the Hospitalists listed on amion for  assistance.  05/06/2021, 10:14 PM

## 2021-05-06 NOTE — Care Plan (Signed)
50 year old gentleman who apparently was at [facility] hospital for small bowel obstruction and discharged 1 to 2 days ago presented to the emergency department yet again with abdominal pain, nausea and vomiting.  Reportedly, he was managed conservatively at [facility].  He was once again diagnosed with small bowel obstruction.  Labs unremarkable, lactic acid pending.  COVID-negative.  Lactic acid requested and pending.  Per ED physician, abdomen is benign and does not seem to be surgical and that patient might just need conservative management however CT abdomen shows clear transition point at right lower quadrant.  Patient appears to be hemodynamically stable based off of the labs.  Patient accepted at [facility] and will be admitted under [facility] for management of small bowel obstruction.  ED physician to remain responsible for complete patient care until patient arrives at [facility].  RN at [facility] to call flow managers so appropriate hospitalist can be paged for admissions once patient arrives.

## 2021-05-06 NOTE — ED Notes (Addendum)
Pt was not discharged; waiting to be transferred

## 2021-05-06 NOTE — ED Triage Notes (Signed)
Pt arrives to ED with c/o of abdominal pain and bowel blockage. Pt was admitted to [facility] last week for same and was d/c this past Thursday. Pt reports the pain continued this weekend and he received another abd XR which showed continued bowel blockage. Pt reports last BM was Saturday 9/17 and last ate yesterday 9/18. Pt reports the pain has increased and he is constantly nauseous.

## 2021-05-06 NOTE — ED Provider Notes (Signed)
[facility] EMERGENCY DEPT Provider Note   CSN: [id number] Arrival date & time: 05/06/21  1223     History Chief Complaint  Patient presents with   Abdominal Pain    Ricky [name] is a 50 y.o. male.  HPI 50 year old male presents with small bowel obstruction.  He was sent over here by his gastroenterologist, Dr. Elnoria Howard.  The patient was admitted to [facility] last week for an SBO that resolved with bowel rest.  He was eating and drinking on discharge but then his symptoms came back yesterday.  Had a bowel movement yesterday and was passing some gas but no gas passing today.  He is nauseated though no vomiting.  He has some constant pain but sometimes it worsens and gets up to about a 7 out of 10.  Had abdominal x-rays at his gastroenterologist office today and was told to come here due to findings of obstruction on them.  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/07/2012    Past Surgical History:  Procedure Laterality Date   ADENOIDECTOMY     ANTERIOR FUSION CERVICAL SPINE     APPENDECTOMY     HERNIA REPAIR     IRRIGATION AND DEBRIDEMENT SEBACEOUS CYST     on scalp   KNEE ARTHROSCOPY     x2   LUMBAR FUSION     POSTERIOR FUSION CERVICAL SPINE     TONSILLECTOMY         Family History  Problem Relation Age of Onset   Diabetes Mother    Hypertension Mother    Hyperlipidemia Mother    Stroke  Mother    Anxiety disorder Mother    Obesity Mother    Hypertension Father    Hyperlipidemia Father     Social History   Tobacco Use   Smoking status: Former    Packs/day: 0.50    Years: 6.00    Pack years: 3.00    Types: Cigarettes    Quit date: 2013    Years since quitting: 9.7   Smokeless tobacco: Never  Substance Use Topics   Alcohol use: No   Drug use: No    Home Medications Prior to Admission medications   Medication Sig Start Date End Date Taking? Authorizing Provider  Azilsartan-Chlorthalidone 40-25 MG TABS Take 1 tablet by mouth daily. 11/01/20   Little Ishikawa, MD  Blood Pressure Monitoring (BLOOD PRESSURE CUFF) MISC XL Adult blood pressure cuff 08/18/19   Little Ishikawa, MD  carvedilol (COREG) 25 MG tablet Take 1 tablet (25 mg total) by mouth 2 (two) times daily with a meal. 10/01/20   Little Ishikawa, MD  cyclobenzaprine (FLEXERIL) 10 MG tablet Take 1 tablet (10 mg total) by mouth 2 (two) times daily as needed for muscle spasms. 07/10/19   Alvira Monday, MD  dicyclomine (BENTYL) 20 MG tablet Take  1 tablet (20 mg total) by mouth 2 (two) times daily between meals as needed for spasms. 12/03/19   Lorelee New, PA-C  Insulin Pen Needle 32G X 4 MM MISC Use to inject Ozempic once weekly 09/25/20   Little Ishikawa, MD  omeprazole (PRILOSEC) 20 MG capsule Take 1 capsule (20 mg total) by mouth daily. 07/11/20   Little Ishikawa, MD  potassium chloride SA (KLOR-CON) 20 MEQ tablet Take 1 tablet (20 mEq total) by mouth daily. 01/04/21 12/30/21  Little Ishikawa, MD  rosuvastatin (CRESTOR) 10 MG tablet TAKE 1 TABLET(10 MG) BY MOUTH DAILY 02/01/21   Little Ishikawa, MD  Semaglutide,0.25 or 0.5MG /DOS, (OZEMPIC, 0.25 OR 0.5 MG/DOSE,) 2 MG/1.5ML SOPN Inject 0.5 mg into the skin once a week.    [provider]  sildenafil (VIAGRA) 100 MG tablet TAKE 1/2 TO 1 TABLET BY MOUTH AS NEEDED. Defer further refills to PCP. 04/04/21    Little Ishikawa, MD    Allergies    Patient has no known allergies.  Review of Systems   Review of Systems  Constitutional:  Negative for fever.  Gastrointestinal:  Positive for abdominal distention, abdominal pain and nausea. Negative for vomiting.  All other systems reviewed and are negative.  Physical Exam Updated Vital Signs BP 102/81 (BP Location: Right Arm)   Pulse (!) 58   Temp 98.6 F (37 C) (Oral)   Resp 18   Ht 6' (1.829 m)   Wt 123.4 kg   SpO2 98%   BMI 36.89 kg/m   Physical Exam Vitals and nursing note reviewed.  Constitutional:      General: He is not in acute distress.    Appearance: He is well-developed. He is not ill-appearing or diaphoretic.  HENT:     Head: Normocephalic and atraumatic.     Right Ear: External ear normal.     Left Ear: External ear normal.     Nose: Nose normal.  Eyes:     General:        Right eye: No discharge.        Left eye: No discharge.  Cardiovascular:     Rate and Rhythm: Normal rate and regular rhythm.     Heart sounds: Normal heart sounds.  Pulmonary:     Effort: Pulmonary effort is normal.     Breath sounds: Normal breath sounds.  Abdominal:     General: There is distension.     Palpations: Abdomen is soft.     Tenderness: There is generalized abdominal tenderness.  Musculoskeletal:     Cervical back: Neck supple.  Skin:    General: Skin is warm and dry.  Neurological:     Mental Status: He is alert.  Psychiatric:        Mood and Affect: Mood is not anxious.    ED Results / Procedures / Treatments   Labs (all labs ordered are listed, but only abnormal results are displayed) Labs Reviewed  CBC WITH DIFFERENTIAL/PLATELET - Abnormal; Notable for the following components:      Result Value   Platelets 126 (*)    All other components within normal limits  RESP PANEL BY RT-PCR (FLU A&B, COVID) [id number]  COMPREHENSIVE METABOLIC PANEL    EKG None  Radiology DG Abd 2 Views  Result Date:  05/06/2021 CLINICAL DATA:  Abdominal pain, bloating. EXAM: ABDOMEN - 2 VIEW COMPARISON:  None. FINDINGS: Dilated small bowel loops are noted with air-fluid levels concerning for distal small bowel obstruction.  No colonic dilatation is noted. There is no evidence of free air. No radio-opaque calculi or other significant radiographic abnormality is seen. IMPRESSION: Dilated small bowel loops are noted with air-fluid levels concerning for distal small bowel obstruction. Electronically Signed   By: Lupita Raider M.D.   On: 05/06/2021 11:16    Procedures Procedures   Medications Ordered in ED Medications  lactated ringers bolus 1,000 mL (1,000 mLs Intravenous New Bag/Given 05/06/21 1441)    ED Course  I have reviewed the triage vital signs and the nursing notes.  Pertinent labs & imaging results that were available during my care of the patient were reviewed by me and considered in my medical decision making (see chart for details).    MDM Rules/Calculators/A&P                           Labs and CT pending. Patient will likely need admission for SBO. Care to Dr. Karene Fry.  Final Clinical Impression(s) / ED Diagnoses Final diagnoses:  None    Rx / DC Orders ED Discharge Orders     None        Pricilla Loveless, MD 05/06/21 1534

## 2021-05-06 NOTE — ED Provider Notes (Signed)
   MDM    SBO at [facility] last week, symptoms have returned, KUB at PCP showed repeat SBO. Potential admit. Workup pending.  5:04 PM CT abdomen pelvis performed which resulted concerning for small bowel obstruction with a transition point noted in the right lower quadrant.  Given the patient's recent admission to [facility] in [place] and patient preference, the physician's access line was contacted at [facility] for consideration for transfer for admission for small bowel obstruction.  5:44 PM [facility] is currently at capacity.  Patient consented to admission to [facility] health.  Hospitalist medicine consulted for admission for medical management of small bowel obstruction with consideration given to potential surgical consult later on during admission.  Patient subsequently admitted.      Ernie Avena, MD 05/06/21 1745

## 2021-05-07 ENCOUNTER — Encounter (HOSPITAL_COMMUNITY): Payer: Self-pay | Admitting: Anesthesiology

## 2021-05-07 ENCOUNTER — Encounter (HOSPITAL_COMMUNITY)
Admission: EM | Disposition: A | Discharge status: Home / Self Care | Payer: Self-pay | Source: Ambulatory Visit | Attending: Internal Medicine

## 2021-05-07 ENCOUNTER — Encounter (HOSPITAL_COMMUNITY): Payer: Self-pay | Admitting: Family Medicine

## 2021-05-07 ENCOUNTER — Inpatient Hospital Stay (HOSPITAL_COMMUNITY): Payer: Medicare Other

## 2021-05-07 DIAGNOSIS — K56609 Unspecified intestinal obstruction, unspecified as to partial versus complete obstruction: Secondary | ICD-10-CM | POA: Diagnosis not present

## 2021-05-07 LAB — CBC WITH DIFFERENTIAL/PLATELET
Abs Immature Granulocytes: 0 10*3/uL (ref 0.00–0.07)
Basophils Absolute: 0 10*3/uL (ref 0.0–0.1)
Basophils Relative: 1 %
Eosinophils Absolute: 0.1 10*3/uL (ref 0.0–0.5)
Eosinophils Relative: 2 %
HCT: 40.1 % (ref 39.0–52.0)
Hemoglobin: 13 g/dL (ref 13.0–17.0)
Immature Granulocytes: 0 %
Lymphocytes Relative: 45 %
Lymphs Abs: 2 10*3/uL (ref 0.7–4.0)
MCH: 28.6 pg (ref 26.0–34.0)
MCHC: 32.4 g/dL (ref 30.0–36.0)
MCV: 88.3 fL (ref 80.0–100.0)
Monocytes Absolute: 0.6 10*3/uL (ref 0.1–1.0)
Monocytes Relative: 13 %
Neutro Abs: 1.7 10*3/uL (ref 1.7–7.7)
Neutrophils Relative %: 39 %
Platelets: 111 10*3/uL — ABNORMAL LOW (ref 150–400)
RBC: 4.54 MIL/uL (ref 4.22–5.81)
RDW: 14.1 % (ref 11.5–15.5)
WBC: 4.3 10*3/uL (ref 4.0–10.5)
nRBC: 0 % (ref 0.0–0.2)

## 2021-05-07 LAB — TYPE AND SCREEN
ABO/RH(D): B POS
Antibody Screen: NEGATIVE

## 2021-05-07 LAB — COMPREHENSIVE METABOLIC PANEL
ALT: 27 U/L (ref 0–44)
AST: 28 U/L (ref 15–41)
Albumin: 3.4 g/dL — ABNORMAL LOW (ref 3.5–5.0)
Alkaline Phosphatase: 59 U/L (ref 38–126)
Anion gap: 10 (ref 5–15)
BUN: 8 mg/dL (ref 6–20)
CO2: 26 mmol/L (ref 22–32)
Calcium: 8.6 mg/dL — ABNORMAL LOW (ref 8.9–10.3)
Chloride: 102 mmol/L (ref 98–111)
Creatinine, Ser: 0.89 mg/dL (ref 0.61–1.24)
GFR, Estimated: >60 mL/min (ref >60)
Glucose, Bld: 80 mg/dL (ref 70–99)
Potassium: 2.9 mmol/L — ABNORMAL LOW (ref 3.5–5.1)
Sodium: 138 mmol/L (ref 135–145)
Total Bilirubin: 1.3 mg/dL — ABNORMAL HIGH (ref 0.3–1.2)
Total Protein: 6.5 g/dL (ref 6.5–8.1)

## 2021-05-07 LAB — HIV ANTIBODY (ROUTINE TESTING W REFLEX): HIV Screen 4th Generation wRfx: NONREACTIVE

## 2021-05-07 SURGERY — LAPAROSCOPY, DIAGNOSTIC
Anesthesia: General

## 2021-05-07 MED ORDER — SUCCINYLCHOLINE CHLORIDE 200 MG/10ML IV SOSY
PREFILLED_SYRINGE | INTRAVENOUS | Status: AC
  Filled 2021-05-07: qty 10

## 2021-05-07 MED ORDER — POTASSIUM CHLORIDE CRYS ER 20 MEQ PO TBCR
40.0000 meq | EXTENDED_RELEASE_TABLET | Freq: Every day | ORAL | Status: DC
  Administered 2021-05-07 – 2021-05-10 (×4): 40 meq via ORAL
  Filled 2021-05-07 (×4): qty 2

## 2021-05-07 MED ORDER — PROPOFOL 10 MG/ML IV BOLUS
INTRAVENOUS | Status: AC
  Filled 2021-05-07: qty 20

## 2021-05-07 MED ORDER — CHLORHEXIDINE GLUCONATE 0.12 % MT SOLN: 15.0000 mL | Freq: Once | OROMUCOSAL | Status: AC

## 2021-05-07 MED ORDER — ROCURONIUM BROMIDE 10 MG/ML (PF) SYRINGE
PREFILLED_SYRINGE | INTRAVENOUS | Status: AC
  Filled 2021-05-07: qty 10

## 2021-05-07 MED ORDER — LACTATED RINGERS IV SOLN: INTRAVENOUS | Status: DC

## 2021-05-07 MED ORDER — ORAL CARE MOUTH RINSE: 15.0000 mL | Freq: Once | OROMUCOSAL | Status: AC

## 2021-05-07 MED ORDER — PHENYLEPHRINE 40 MCG/ML (10ML) SYRINGE FOR IV PUSH (FOR BLOOD PRESSURE SUPPORT)
PREFILLED_SYRINGE | INTRAVENOUS | Status: AC
  Filled 2021-05-07: qty 10

## 2021-05-07 MED ORDER — FENTANYL CITRATE (PF) 250 MCG/5ML IJ SOLN
INTRAMUSCULAR | Status: AC
  Filled 2021-05-07: qty 5

## 2021-05-07 MED ORDER — ONDANSETRON HCL 4 MG/2ML IJ SOLN
INTRAMUSCULAR | Status: AC
  Filled 2021-05-07: qty 2

## 2021-05-07 MED ORDER — CARVEDILOL 12.5 MG PO TABS
ORAL_TABLET | ORAL | Status: AC
  Filled 2021-05-07: qty 2

## 2021-05-07 MED ORDER — MIDAZOLAM HCL 2 MG/2ML IJ SOLN
INTRAMUSCULAR | Status: AC
  Filled 2021-05-07: qty 2

## 2021-05-07 MED ORDER — CARVEDILOL 12.5 MG PO TABS
25.0000 mg | ORAL_TABLET | Freq: Once | ORAL | Status: AC
  Administered 2021-05-07: 25 mg via ORAL

## 2021-05-07 MED ORDER — CHLORHEXIDINE GLUCONATE 0.12 % MT SOLN
OROMUCOSAL | Status: AC
  Administered 2021-05-07: 15 mL via OROMUCOSAL
  Filled 2021-05-07: qty 15

## 2021-05-07 MED ORDER — MELATONIN 3 MG PO TABS
3.0000 mg | ORAL_TABLET | Freq: Once | ORAL | Status: AC
  Administered 2021-05-07: 3 mg via ORAL
  Filled 2021-05-07: qty 1

## 2021-05-07 MED ORDER — CARVEDILOL 25 MG PO TABS: 25.0000 mg | ORAL_TABLET | Freq: Two times a day (BID) | ORAL | Status: DC

## 2021-05-07 MED ORDER — DEXAMETHASONE SODIUM PHOSPHATE 10 MG/ML IJ SOLN
INTRAMUSCULAR | Status: AC
  Filled 2021-05-07: qty 1

## 2021-05-07 MED ORDER — LIDOCAINE 2% (20 MG/ML) 5 ML SYRINGE
INTRAMUSCULAR | Status: AC
  Filled 2021-05-07: qty 5

## 2021-05-07 NOTE — Consult Note (Signed)
Reason for Consult/Chief Complaint: persistent SBO Consultant: Julian Reil, MD  Ricky [name] is an 50 y.o. male.   HPI: 36M with persistent SBO. Transferred from [facility], recent admission at [facility] 9/13-9/15 for SBO. Seemed to have resolution of sx after a period of bowel rest and was discharged. No NGT during that admission. Re-presented with same sx of abdominal pain, early satiety, bloating that resolves after a day or two having multiple loose bowel movements. Is able to eat normally for ~3d before symptoms recur. States onset was in early August and has lost 23 pounds since then. Localizes pain to RLQ between open appy scar and umbilicus. Had appt with GI, Dr. Jeani Hawking 9/19 for f/u after discharge, who sent him for a CT scan of his abdomen.   Prior umbilical hernia repair at 50yo and open appy at 50yo. No prior colonoscopy.   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   Eval Dr Trey Sailors, Neurosurgery 10/13   Chest pain    Depression    High cholesterol    Hypertension    Osteoarthritis    Osteoarthritis    Overweight    SOB (shortness of breath)    Thrombocytopenia (HCC) 07/07/2012   138,000 08/26/11; 98,000 06/29/12    Past Surgical History:  Procedure Laterality Date   ADENOIDECTOMY     ANTERIOR FUSION CERVICAL SPINE     APPENDECTOMY     HERNIA REPAIR     IRRIGATION AND DEBRIDEMENT SEBACEOUS CYST     on scalp   KNEE ARTHROSCOPY     x2   LUMBAR FUSION     POSTERIOR FUSION CERVICAL SPINE     TONSILLECTOMY      Family History  Problem Relation Age of Onset   Diabetes Mother    Hypertension Mother    Hyperlipidemia Mother    Stroke Mother    Anxiety disorder Mother    Obesity Mother    Hypertension Father    Hyperlipidemia Father     Social History:  reports that he quit smoking about 9 years ago. His smoking use included cigarettes. He has a 3.00 pack-year smoking history. He has never used smokeless  tobacco. He reports that he does not drink alcohol and does not use drugs.  Allergies: No Known Allergies  Medications: I have reviewed the patient's current medications.  Results for orders placed or performed during the hospital encounter of 05/06/21 (from the past 48 hour(s))  Comprehensive metabolic panel     Status: None   Collection Time: 05/06/21  2:38 PM  Result Value Ref Range   Sodium 140 135 - 145 mmol/L   Potassium 3.5 3.5 - 5.1 mmol/L   Chloride 101 98 - 111 mmol/L   CO2 31 22 - 32 mmol/L   Glucose, Bld 96 70 - 99 mg/dL    Comment: Glucose reference range applies only to samples taken after fasting for at least 8 hours.   BUN 15 6 - 20 mg/dL   Creatinine, Ser 8.18 0.61 - 1.24 mg/dL   Calcium 9.4 8.9 - 56.3 mg/dL   Total Protein 7.4 6.5 - 8.1 g/dL   Albumin 4.4 3.5 - 5.0 g/dL   AST 29 15 - 41 U/L   ALT 26 0 - 44 U/L   Alkaline Phosphatase 68 38 - 126 U/L   Total Bilirubin 0.9 0.3 - 1.2 mg/dL   GFR, Estimated >14 >97  mL/min    Comment: (NOTE) Calculated using the CKD-EPI Creatinine Equation (2021)    Anion gap 8 5 - 15    Comment: Performed at [facility], [address] [place], [place] [address]  CBC with Differential     Status: Abnormal   Collection Time: 05/06/21  2:38 PM  Result Value Ref Range   WBC 5.4 4.0 - 10.5 K/uL   RBC 4.94 4.22 - 5.81 MIL/uL   Hemoglobin 14.1 13.0 - 17.0 g/dL   HCT 27.0 35.0 - 09.3 %   MCV 88.9 80.0 - 100.0 fL   MCH 28.5 26.0 - 34.0 pg   MCHC 32.1 30.0 - 36.0 g/dL   RDW 81.8 29.9 - 37.1 %   Platelets 126 (L) 150 - 400 K/uL   nRBC 0.0 0.0 - 0.2 %   Neutrophils Relative % 42 %   Neutro Abs 2.3 1.7 - 7.7 K/uL   Lymphocytes Relative 48 %   Lymphs Abs 2.6 0.7 - 4.0 K/uL   Monocytes Relative 9 %   Monocytes Absolute 0.5 0.1 - 1.0 K/uL   Eosinophils Relative 1 %   Eosinophils Absolute 0.1 0.0 - 0.5 K/uL   Basophils Relative 0 %   Basophils Absolute 0.0 0.0 - 0.1 K/uL   WBC Morphology MORPHOLOGY UNREMARKABLE     RBC Morphology MORPHOLOGY UNREMARKABLE    Smear Review MORPHOLOGY UNREMARKABLE     Comment: PLATELETS APPEAR DECREASED   Abs Immature Granulocytes 0.00 0.00 - 0.07 K/uL    Comment: Performed at [facility], [address], [place], [place] [address]  Resp Panel by RT-PCR (Flu A&B, Covid) Nasopharyngeal Swab     Status: None   Collection Time: 05/06/21  2:38 PM   Specimen: Nasopharyngeal Swab; Nasopharyngeal(NP) swabs in vial transport medium  Result Value Ref Range   SARS Coronavirus 2 by RT PCR NEGATIVE NEGATIVE    Comment: (NOTE) SARS-CoV-2 target nucleic acids are NOT DETECTED.  The SARS-CoV-2 RNA is generally detectable in upper respiratory specimens during the acute phase of infection. The lowest concentration of SARS-CoV-2 viral copies this assay can detect is 138 copies/mL. A negative result does not preclude SARS-Cov-2 infection and should not be used as the sole basis for treatment or other patient management decisions. A negative result may occur with  improper specimen collection/handling, submission of specimen other than nasopharyngeal swab, presence of viral mutation(s) within the areas targeted by this assay, and inadequate number of viral copies(<138 copies/mL). A negative result must be combined with clinical observations, patient history, and epidemiological information. The expected result is Negative.  Fact Sheet for Patients:  BloggerCourse.com  Fact Sheet for Healthcare Providers:  SeriousBroker.it  This test is no t yet approved or cleared by the [place] FDA and  has been authorized for detection and/or diagnosis of SARS-CoV-2 by FDA under an Emergency Use Authorization (EUA). This EUA will remain  in effect (meaning this test can be used) for the duration of the COVID-19 declaration under Section 564(b)(1) of the Act, 21 U.S.C.section 360bbb-3(b)(1), unless the authorization is  terminated  or revoked sooner.       Influenza A by PCR NEGATIVE NEGATIVE   Influenza B by PCR NEGATIVE NEGATIVE    Comment: (NOTE) The Xpert Xpress SARS-CoV-2/FLU/RSV plus assay is intended as an aid in the diagnosis of influenza from Nasopharyngeal swab specimens and should not be used as a sole basis for treatment. Nasal washings and aspirates are unacceptable for Xpert Xpress SARS-CoV-2/FLU/RSV testing.  Fact Sheet for Patients: BloggerCourse.com  Fact Sheet for Healthcare Providers: SeriousBroker.it  This test is not yet approved or cleared by the [place] FDA and has been authorized for detection and/or diagnosis of SARS-CoV-2 by FDA under an Emergency Use Authorization (EUA). This EUA will remain in effect (meaning this test can be used) for the duration of the COVID-19 declaration under Section 564(b)(1) of the Act, 21 U.S.C. section 360bbb-3(b)(1), unless the authorization is terminated or revoked.  Performed at [facility], [address] [place], [place] [address]   Lactic acid, plasma     Status: None   Collection Time: 05/06/21  6:17 PM  Result Value Ref Range   Lactic Acid, Venous 1.3 0.5 - 1.9 mmol/L    Comment: Performed at [facility], [address] [place], [place] [address]  Lactic acid, plasma     Status: None   Collection Time: 05/06/21 10:14 PM  Result Value Ref Range   Lactic Acid, Venous 1.0 0.5 - 1.9 mmol/L    Comment: Performed at [facility], [address], [place], [place] [address]  CBC     Status: Abnormal   Collection Time: 05/06/21 10:21 PM  Result Value Ref Range   WBC 5.5 4.0 - 10.5 K/uL   RBC 4.91 4.22 - 5.81 MIL/uL   Hemoglobin 14.0 13.0 - 17.0 g/dL   HCT 78.2 42.3 - 53.6 %   MCV 90.2 80.0 - 100.0 fL   MCH 28.5 26.0 - 34.0 pg   MCHC 31.6 30.0 - 36.0 g/dL   RDW 14.4 31.5 - 40.0 %   Platelets 118 (L) 150 - 400 K/uL     Comment: Immature Platelet Fraction may be clinically indicated, consider ordering this additional test [id number] REPEATED TO VERIFY PLATELET COUNT CONFIRMED BY SMEAR    nRBC 0.0 0.0 - 0.2 %    Comment: Performed at [facility], [address], [place], [place] [address]    CT ABDOMEN PELVIS W CONTRAST  Result Date: 05/06/2021 CLINICAL DATA:  Bowel obstruction suspected. EXAM: CT ABDOMEN AND PELVIS WITH CONTRAST TECHNIQUE: Multidetector CT imaging of the abdomen and pelvis was performed using the standard protocol following bolus administration of intravenous contrast. CONTRAST:  39mL OMNIPAQUE IOHEXOL 350 MG/ML SOLN COMPARISON:  CT Abdomen Pelvis, 04/30/2021 and 03/27/2021. FINDINGS: Lower chest: No acute abnormality. Hepatobiliary: No focal liver abnormality is seen. No gallstones, gallbladder wall thickening, or biliary dilatation. Pancreas: Unremarkable. No pancreatic ductal dilatation or surrounding inflammatory changes. Spleen: Normal in size without focal abnormality. Adrenals/Urinary Tract: Adrenal glands are unremarkable. Kidneys are normal, without renal calculi, focal lesion, or hydronephrosis. Bladder is unremarkable. Stomach/Bowel: Small hiatus hernia.  Stomach is decompressed. Persistence of air and-fluid distention of multiple small bowel loops, with focal transition within the RIGHT lower quadrant (# 2, 65/# 6, 69). No evidence of perforation, or overt vascular compromise. The appendix is not well visualized. Nondilated colon. Mild colonic diverticulosis. Vascular/Lymphatic: No significant vascular findings are present. No enlarged abdominal or pelvic lymph nodes. Reproductive: Prostate is unremarkable. Other: Umbilical herniorrhaphy. No abdominal wall hernia or abnormality. No abdominopelvic ascites. Musculoskeletal: Small RIGHT anterior abdominal wall contusions, likely injection sites. No acute or significant osseous findings. Degenerative changes of the spine, including L4-L5  facet arthropathy and posterior fusion. IMPRESSION: Persistent small bowel obstruction with discrete transition within the RIGHT lower quadrant (see key images). No evidence of perforation or overt vascular compromise. Electronically Signed   By: Gerome Sam.D.  On: 05/06/2021 16:37   DG Abd 2 Views  Result Date: 05/06/2021 CLINICAL DATA:  Abdominal pain, bloating. EXAM: ABDOMEN - 2 VIEW COMPARISON:  None. FINDINGS: Dilated small bowel loops are noted with air-fluid levels concerning for distal small bowel obstruction. No colonic dilatation is noted. There is no evidence of free air. No radio-opaque calculi or other significant radiographic abnormality is seen. IMPRESSION: Dilated small bowel loops are noted with air-fluid levels concerning for distal small bowel obstruction. Electronically Signed   By: Lupita Raider M.D.   On: 05/06/2021 11:16    ROS 10 point review of systems is negative except as listed above in HPI.   Physical Exam Blood pressure 104/69, pulse (!) 56, temperature 98.9 F (37.2 C), temperature source Oral, resp. rate 18, height 6' (1.829 m), weight 124 kg, SpO2 100 %. Constitutional: well-developed, well-nourished HEENT: pupils equal, round, reactive to light, 55mm b/l, moist conjunctiva, external inspection of ears and nose normal, hearing intact Oropharynx: normal oropharyngeal mucosa, normal dentition Neck: no thyromegaly, trachea midline, no midline cervical tenderness to palpation Chest: breath sounds equal bilaterally, normal respiratory effort, no midline or lateral chest wall tenderness to palpation/deformity Abdomen: soft, NT/ND, no bruising, no hepatosplenomegaly GU: no blood at urethral meatus of penis, no scrotal masses or abnormality  Back: no wounds, no thoracic/lumbar spine tenderness to palpation, no thoracic/lumbar spine stepoffs Rectal: deferred Extremities: 2+ radial and pedal pulses bilaterally, intact motor and sensation bilateral UE and LE, no  peripheral edema MSK: normal gait/station, no clubbing/cyanosis of fingers/toes, normal ROM of all four extremities Skin: warm, dry, no rashes Psych: normal memory, normal mood/affect    Assessment/Plan: 4M with persistent pSBO. Recommend NGT placement. Suspect he will need operative intervention, but will defer final decision-making to day team.   Diamantina Monks, MD General and Trauma Surgery [facility]

## 2021-05-07 NOTE — Progress Notes (Signed)
Day of Surgery   Subjective/Chief Complaint: Had BM No abdominal pain now   Objective: Vital signs in last 24 hours: Temp:  [98.3 F (36.8 C)-98.9 F (37.2 C)] 98.4 F (36.9 C) (09/20 1117) Pulse Rate:  [46-82] 62 (09/20 1117) Resp:  [16-20] 17 (09/20 1117) BP: (77-153)/(45-119) 129/77 (09/20 1117) SpO2:  [98 %-100 %] 99 % (09/20 1117) Weight:  [123.4 kg-124 kg] 124 kg (09/20 1117) Last BM Date: 05/06/21  Intake/Output from previous day: 09/19 0701 - 09/20 0700 In: 2012.1 [I.V.:1012.1; IV Piggyback:1000] Out: -  Intake/Output this shift: No intake/output data recorded.  Exam: Awake and alert Abdomen soft, obese, Non-tender  Lab Results:  Recent Labs    05/06/21 2221 05/07/21 0832  WBC 5.5 4.3  HGB 14.0 13.0  HCT 44.3 40.1  PLT 118* 111*   BMET Recent Labs    05/06/21 1438 05/07/21 0832  NA 140 138  K 3.5 2.9*  CL 101 102  CO2 31 26  GLUCOSE 96 80  BUN 15 8  CREATININE 0.94 0.89  CALCIUM 9.4 8.6*   PT/INR No results for input(s): LABPROT, INR in the last 72 hours. ABG No results for input(s): PHART, HCO3 in the last 72 hours.  Invalid input(s): PCO2, PO2  Studies/Results: CT ABDOMEN PELVIS W CONTRAST  Result Date: 05/06/2021 CLINICAL DATA:  Bowel obstruction suspected. EXAM: CT ABDOMEN AND PELVIS WITH CONTRAST TECHNIQUE: Multidetector CT imaging of the abdomen and pelvis was performed using the standard protocol following bolus administration of intravenous contrast. CONTRAST:  61mL OMNIPAQUE IOHEXOL 350 MG/ML SOLN COMPARISON:  CT Abdomen Pelvis, 04/30/2021 and 03/27/2021. FINDINGS: Lower chest: No acute abnormality. Hepatobiliary: No focal liver abnormality is seen. No gallstones, gallbladder wall thickening, or biliary dilatation. Pancreas: Unremarkable. No pancreatic ductal dilatation or surrounding inflammatory changes. Spleen: Normal in size without focal abnormality. Adrenals/Urinary Tract: Adrenal glands are unremarkable. Kidneys are normal,  without renal calculi, focal lesion, or hydronephrosis. Bladder is unremarkable. Stomach/Bowel: Small hiatus hernia.  Stomach is decompressed. Persistence of air and-fluid distention of multiple small bowel loops, with focal transition within the RIGHT lower quadrant (# 2, 65/# 6, 69). No evidence of perforation, or overt vascular compromise. The appendix is not well visualized. Nondilated colon. Mild colonic diverticulosis. Vascular/Lymphatic: No significant vascular findings are present. No enlarged abdominal or pelvic lymph nodes. Reproductive: Prostate is unremarkable. Other: Umbilical herniorrhaphy. No abdominal wall hernia or abnormality. No abdominopelvic ascites. Musculoskeletal: Small RIGHT anterior abdominal wall contusions, likely injection sites. No acute or significant osseous findings. Degenerative changes of the spine, including L4-L5 facet arthropathy and posterior fusion. IMPRESSION: Persistent small bowel obstruction with discrete transition within the RIGHT lower quadrant (see key images). No evidence of perforation or overt vascular compromise. Electronically Signed   By: Roanna Banning M.D.   On: 05/06/2021 16:37   DG Abd 2 Views  Result Date: 05/06/2021 CLINICAL DATA:  Abdominal pain, bloating. EXAM: ABDOMEN - 2 VIEW COMPARISON:  None. FINDINGS: Dilated small bowel loops are noted with air-fluid levels concerning for distal small bowel obstruction. No colonic dilatation is noted. There is no evidence of free air. No radio-opaque calculi or other significant radiographic abnormality is seen. IMPRESSION: Dilated small bowel loops are noted with air-fluid levels concerning for distal small bowel obstruction. Electronically Signed   By: Lupita Raider M.D.   On: 05/06/2021 11:16   DG ABD ACUTE 2+V W 1V CHEST  Result Date: 05/07/2021 CLINICAL DATA:  Small-bowel obstruction, abdominal pain EXAM: DG ABDOMEN ACUTE WITH 1 VIEW  CHEST COMPARISON:  05/06/2021 FINDINGS: Improvement in gas distended  loops of small bowel in the central abdomen, now measuring up to 4.8 cm, previously as large as 7.5 cm. There is scattered gas and stool present throughout the colon to the rectum. No free air in the abdomen. Mild cardiomegaly.  No acute abnormality of the lungs. IMPRESSION: 1. Improvement in gas distended loops of small bowel in the central abdomen, consistent with improved obstruction or ileus. There is scattered gas and stool present throughout the colon to the rectum. 2.  Cardiomegaly without acute abnormality of the lungs. Electronically Signed   By: Lauralyn Primes M.D.   On: 05/07/2021 10:21    Anti-infectives: Anti-infectives (From admission, onward)    None       Assessment/Plan: SBO  Clinically improved.  Xrays better.  Discussed with patient. Will cancel surgery today and continue conservative management.  If recurrs, will again recommend surgery.  LOS: 1 day    Abigail Miyamoto 05/07/2021

## 2021-05-07 NOTE — Progress Notes (Signed)
PROGRESS NOTE   Ricky [name]  NIO:[id number] DOB: Aug 15, 1971 DOA: 05/06/2021 PCP: Maryagnes Amos, FNP  Brief Narrative:  50 year old morbidly obese Male BMI 78, umbilical surgery age 81 with hernia Appendectomy age 14 Recent admission [facility] 9/13 through 9/15 SBO no NG tube placed Leander Rams presented to [facility] 9/19 with similar symptoms found to have SBO  Hospital-Problem based course  SBO Resolved relatively quickly no current nausea vomiting passing stools and gas much like he did over at [facility] We will observe him as per general surgery team-diet as in addition to next steps in management as he may require surgery given recent multiple episodes of this recurrent Keep on saline 125 cc/H Mild hypokalemia Replace with K. Dur 40 daily HTN Resume Coreg 25 twice daily-hold chlorthalidone combo med for now Impaired glucose tolerance Need to confirm if takes Ozempic at home on discharge  DVT prophylaxis: SCD Code Status: Full Family Communication: No Disposition:  Status is: Inpatient  Remains inpatient appropriate because:Unsafe d/c plan and IV treatments appropriate due to intensity of illness or inability to take PO  Dispo: The patient is from: Home              Anticipated d/c is to: Home              Patient currently is not medically stable to d/c.   Difficult to place patient No   Consultants:  Gen surgery  Procedures:  n  Antimicrobials: n    Subjective:  Passing multiple stools at this time No chest pain no fever passing gas in addition  Objective: Vitals:   05/07/21 0533 05/07/21 0601 05/07/21 0752 05/07/21 1117  BP: (!) 153/119 (!) 92/51 99/79 129/77  Pulse: 82  64 62  Resp: 18  20 17   Temp: 98.7 F (37.1 C)  98.3 F (36.8 C) 98.4 F (36.9 C)  TempSrc: Oral  Oral Oral  SpO2: 100%  99% 99%  Weight:    124 kg  Height:    6' (1.829 m)    Intake/Output Summary (Last 24 hours) at 05/07/2021 1159 Last data filed at 05/07/2021  0700 Gross per 24 hour  Intake 2012.05 ml  Output --  Net 2012.05 ml   Filed Weights   05/06/21 1250 05/06/21 2310 05/07/21 1117  Weight: 123.4 kg 124 kg 124 kg    Examination:  EOMI NCAT no focal deficit Chest clear no added sound no rales no rhonchi Abdomen soft no rebound no guarding  Thick neck Mallampati 4  Data Reviewed: personally reviewed   CBC    Component Value Date/Time   WBC 4.3 05/07/2021 0832   RBC 4.54 05/07/2021 0832   HGB 13.0 05/07/2021 0832   HGB 15.0 01/02/2021 1040   HGB 15.4 07/30/2012 0932   HCT 40.1 05/07/2021 0832   HCT 45.9 01/02/2021 1040   HCT 46.5 07/30/2012 0932   PLT 111 (L) 05/07/2021 0832   PLT 155 01/02/2021 1040   MCV 88.3 05/07/2021 0832   MCV 90 01/02/2021 1040   MCV 89.8 07/30/2012 0932   MCH 28.6 05/07/2021 0832   MCHC 32.4 05/07/2021 0832   RDW 14.1 05/07/2021 0832   RDW 12.7 01/02/2021 1040   RDW 14.1 07/30/2012 0932   LYMPHSABS 2.0 05/07/2021 0832   LYMPHSABS 2.6 04/05/2020 1541   LYMPHSABS 2.0 07/30/2012 0932   MONOABS 0.6 05/07/2021 0832   MONOABS 0.5 07/30/2012 0932   EOSABS 0.1 05/07/2021 0832   EOSABS 0.1 04/05/2020 1541  BASOSABS 0.0 05/07/2021 0832   BASOSABS 0.0 04/05/2020 1541   BASOSABS 0.0 07/30/2012 0932   CMP Latest Ref Rng & Units 05/07/2021 05/06/2021 04/04/2021  Glucose 70 - 99 mg/dL 80 96 97  BUN 6 - 20 mg/dL 8 15 12   Creatinine 0.61 - 1.24 mg/dL 2.95 1.88  Sodium 135 - 145 mmol/L 138 140 143  Potassium 3.5 - 5.1 mmol/L 2.9(L) 3.5 3.9  Chloride 98 - 111 mmol/L 102 101 103  CO2 22 - 32 mmol/L 26 31 28   Calcium 8.9 - 10.3 mg/dL 4.16) 9.4 8.7  Total Protein 6.5 - 8.1 g/dL 6.5 7.4 -  Total Bilirubin 0.3 - 1.2 mg/dL ) 0.9 -  Alkaline Phos 38 - 126 U/L 59 68 -  AST 15 - 41 U/L 28 29 -  ALT 0 - 44 U/L 27 26 -     Radiology Studies: CT ABDOMEN PELVIS W CONTRAST  Result Date: 05/06/2021 CLINICAL DATA:  Bowel obstruction suspected. EXAM: CT ABDOMEN AND PELVIS WITH CONTRAST TECHNIQUE:  Multidetector CT imaging of the abdomen and pelvis was performed using the standard protocol following bolus administration of intravenous contrast. CONTRAST:  30mL OMNIPAQUE IOHEXOL 350 MG/ML SOLN COMPARISON:  CT Abdomen Pelvis, 04/30/2021 and 03/27/2021. FINDINGS: Lower chest: No acute abnormality. Hepatobiliary: No focal liver abnormality is seen. No gallstones, gallbladder wall thickening, or biliary dilatation. Pancreas: Unremarkable. No pancreatic ductal dilatation or surrounding inflammatory changes. Spleen: Normal in size without focal abnormality. Adrenals/Urinary Tract: Adrenal glands are unremarkable. Kidneys are normal, without renal calculi, focal lesion, or hydronephrosis. Bladder is unremarkable. Stomach/Bowel: Small hiatus hernia.  Stomach is decompressed. Persistence of air and-fluid distention of multiple small bowel loops, with focal transition within the RIGHT lower quadrant (# 2, 65/# 6, 69). No evidence of perforation, or overt vascular compromise. The appendix is not well visualized. Nondilated colon. Mild colonic diverticulosis. Vascular/Lymphatic: No significant vascular findings are present. No enlarged abdominal or pelvic lymph nodes. Reproductive: Prostate is unremarkable. Other: Umbilical herniorrhaphy. No abdominal wall hernia or abnormality. No abdominopelvic ascites. Musculoskeletal: Small RIGHT anterior abdominal wall contusions, likely injection sites. No acute or significant osseous findings. Degenerative changes of the spine, including L4-L5 facet arthropathy and posterior fusion. IMPRESSION: Persistent small bowel obstruction with discrete transition within the RIGHT lower quadrant (see key images). No evidence of perforation or overt vascular compromise. Electronically Signed   By: 05/02/2021 M.D.   On: 05/06/2021 16:37   DG Abd 2 Views  Result Date: 05/06/2021 CLINICAL DATA:  Abdominal pain, bloating. EXAM: ABDOMEN - 2 VIEW COMPARISON:  None. FINDINGS: Dilated small bowel  loops are noted with air-fluid levels concerning for distal small bowel obstruction. No colonic dilatation is noted. There is no evidence of free air. No radio-opaque calculi or other significant radiographic abnormality is seen. IMPRESSION: Dilated small bowel loops are noted with air-fluid levels concerning for distal small bowel obstruction. Electronically Signed   By: 05/08/2021 M.D.   On: 05/06/2021 11:16   DG ABD ACUTE 2+V W 1V CHEST  Result Date: 05/07/2021 CLINICAL DATA:  Small-bowel obstruction, abdominal pain EXAM: DG ABDOMEN ACUTE WITH 1 VIEW CHEST COMPARISON:  05/06/2021 FINDINGS: Improvement in gas distended loops of small bowel in the central abdomen, now measuring up to 4.8 cm, previously as large as 7.5 cm. There is scattered gas and stool present throughout the colon to the rectum. No free air in the abdomen. Mild cardiomegaly.  No acute abnormality of the lungs. IMPRESSION: 1. Improvement in gas distended  loops of small bowel in the central abdomen, consistent with improved obstruction or ileus. There is scattered gas and stool present throughout the colon to the rectum. 2.  Cardiomegaly without acute abnormality of the lungs. Electronically Signed   By: Lauralyn Primes M.D.   On: 05/07/2021 10:21     Scheduled Meds:  carvedilol       potassium chloride  40 mEq Oral Daily   Continuous Infusions:  lactated ringers 125 mL/hr at 05/07/21 0700   lactated ringers 10 mL/hr at 05/07/21 1136     LOS: 1 day   Time spent: 35  Rhetta Mura, MD [facility] To contact the attending provider between 7A-7P or the covering provider during after hours 7P-7A, please log into the web site [email or web address] and access using universal [place] password for that web site. If you do not have the password, please call the hospital operator.  05/07/2021, 11:59 AM

## 2021-05-07 NOTE — Progress Notes (Signed)
Patient deferred the placement of NG tube now since not nauseous, no distention, no abdominal pain and felt very comfortable as of this time despite explanation of the importance of abdominal decompression and bowel rest as initial conservative management of pt's persistent SBO.  Patient also reiterated that he had a history of neck surgery which is very near his vocal chord.  Likewise, deferred the signing of Informed Consent for the possible surgery later today.  Will inform Dr. Bedelia Person and day shift RN accordingly.

## 2021-05-07 NOTE — Progress Notes (Signed)
Procedure cancelled.  6N rn updated.

## 2021-05-08 ENCOUNTER — Inpatient Hospital Stay (HOSPITAL_COMMUNITY): Payer: Medicare Other

## 2021-05-08 DIAGNOSIS — K56609 Unspecified intestinal obstruction, unspecified as to partial versus complete obstruction: Secondary | ICD-10-CM | POA: Diagnosis not present

## 2021-05-08 MED ORDER — AZILSARTAN-CHLORTHALIDONE 40-25 MG PO TABS: 1.0000 | ORAL_TABLET | Freq: Every day | ORAL | Status: DC

## 2021-05-08 MED ORDER — MELATONIN 3 MG PO TABS
3.0000 mg | ORAL_TABLET | Freq: Once | ORAL | Status: AC
  Administered 2021-05-08: 3 mg via ORAL
  Filled 2021-05-08: qty 1

## 2021-05-08 MED ORDER — CHLORTHALIDONE 25 MG PO TABS
25.0000 mg | ORAL_TABLET | Freq: Every day | ORAL | Status: DC
  Administered 2021-05-08 – 2021-05-10 (×3): 25 mg via ORAL
  Filled 2021-05-08 (×3): qty 1

## 2021-05-08 MED ORDER — POTASSIUM CHLORIDE CRYS ER 20 MEQ PO TBCR: 20.0000 meq | EXTENDED_RELEASE_TABLET | Freq: Every day | ORAL | Status: DC

## 2021-05-08 MED ORDER — IRBESARTAN 300 MG PO TABS
300.0000 mg | ORAL_TABLET | Freq: Every day | ORAL | Status: DC
  Administered 2021-05-08 – 2021-05-10 (×3): 300 mg via ORAL
  Filled 2021-05-08 (×3): qty 1

## 2021-05-08 NOTE — Progress Notes (Signed)
Patient ID: Ricky [name], male   DOB: 19-Apr-1971, 50 y.o.   MRN: [id number]  [facility] Surgery Progress Note  1 Day Post-Op  Subjective: CC-  Feels fine. Tolerating full liquids. Passing flatus and had another BM yesterday. Abdominal bloating, n/v have not returned.  Objective: Vital signs in last 24 hours: Temp:  [98.2 F (36.8 C)-98.8 F (37.1 C)] 98.5 F (36.9 C) (09/21 0607) Pulse Rate:  [55-75] 55 (09/21 0607) Resp:  [17-20] 20 (09/20 1553) BP: (103-130)/(62-86) 130/86 (09/21 0607) SpO2:  [97 %-99 %] 99 % (09/21 0607) Weight:  [124 kg] 124 kg (09/20 1117) Last BM Date: 05/07/21  Intake/Output from previous day: 09/20 0701 - 09/21 0700 In: 1080 [P.O.:1080] Out: -  Intake/Output this shift: No intake/output data recorded.  PE: Gen:  Alert, NAD, pleasant Pulm: rate and effort normal Abd: Soft, obese, nontender  Lab Results:  Recent Labs    05/06/21 2221 05/07/21 0832  WBC 5.5 4.3  HGB 14.0 13.0  HCT 44.3 40.1  PLT 118* 111*   BMET Recent Labs    05/06/21 1438 05/07/21 0832  NA 140 138  K 3.5 2.9*  CL 101 102  CO2 31 26  GLUCOSE 96 80  BUN 15 8  CREATININE 0.94 0.89  CALCIUM 9.4 8.6*   PT/INR No results for input(s): LABPROT, INR in the last 72 hours. CMP     Component Value Date/Time   NA 138 05/07/2021 0832   NA 143 04/04/2021 1018   K 2.9 (L) 05/07/2021 0832   CL 102 05/07/2021 0832   CO2 26 05/07/2021 0832   GLUCOSE 80 05/07/2021 0832   BUN 8 05/07/2021 0832   BUN 12 04/04/2021 1018   CREATININE 0.89 05/07/2021 0832   CALCIUM 8.6 (L) 05/07/2021 0832   PROT 6.5 05/07/2021 0832   PROT 7.0 07/23/2020 1429   ALBUMIN 3.4 (L) 05/07/2021 0832   ALBUMIN 4.2 07/23/2020 1429   AST 28 05/07/2021 0832   ALT 27 05/07/2021 0832   ALKPHOS 59 05/07/2021 0832   BILITOT 1.3 (H) 05/07/2021 0832   BILITOT 0.4 07/23/2020 1429   GFRNONAA >60 05/07/2021 0832   GFRAA 120 10/01/2020 1633   Lipase     Component Value Date/Time   LIPASE 28  12/02/2019 2039       Studies/Results: CT ABDOMEN PELVIS W CONTRAST  Result Date: 05/06/2021 CLINICAL DATA:  Bowel obstruction suspected. EXAM: CT ABDOMEN AND PELVIS WITH CONTRAST TECHNIQUE: Multidetector CT imaging of the abdomen and pelvis was performed using the standard protocol following bolus administration of intravenous contrast. CONTRAST:  72mL OMNIPAQUE IOHEXOL 350 MG/ML SOLN COMPARISON:  CT Abdomen Pelvis, 04/30/2021 and 03/27/2021. FINDINGS: Lower chest: No acute abnormality. Hepatobiliary: No focal liver abnormality is seen. No gallstones, gallbladder wall thickening, or biliary dilatation. Pancreas: Unremarkable. No pancreatic ductal dilatation or surrounding inflammatory changes. Spleen: Normal in size without focal abnormality. Adrenals/Urinary Tract: Adrenal glands are unremarkable. Kidneys are normal, without renal calculi, focal lesion, or hydronephrosis. Bladder is unremarkable. Stomach/Bowel: Small hiatus hernia.  Stomach is decompressed. Persistence of air and-fluid distention of multiple small bowel loops, with focal transition within the RIGHT lower quadrant (# 2, 65/# 6, 69). No evidence of perforation, or overt vascular compromise. The appendix is not well visualized. Nondilated colon. Mild colonic diverticulosis. Vascular/Lymphatic: No significant vascular findings are present. No enlarged abdominal or pelvic lymph nodes. Reproductive: Prostate is unremarkable. Other: Umbilical herniorrhaphy. No abdominal wall hernia or abnormality. No abdominopelvic ascites. Musculoskeletal: Small RIGHT anterior abdominal wall  contusions, likely injection sites. No acute or significant osseous findings. Degenerative changes of the spine, including L4-L5 facet arthropathy and posterior fusion. IMPRESSION: Persistent small bowel obstruction with discrete transition within the RIGHT lower quadrant (see key images). No evidence of perforation or overt vascular compromise. Electronically Signed   By:  Roanna Banning M.D.   On: 05/06/2021 16:37   DG Abd 2 Views  Result Date: 05/06/2021 CLINICAL DATA:  Abdominal pain, bloating. EXAM: ABDOMEN - 2 VIEW COMPARISON:  None. FINDINGS: Dilated small bowel loops are noted with air-fluid levels concerning for distal small bowel obstruction. No colonic dilatation is noted. There is no evidence of free air. No radio-opaque calculi or other significant radiographic abnormality is seen. IMPRESSION: Dilated small bowel loops are noted with air-fluid levels concerning for distal small bowel obstruction. Electronically Signed   By: Lupita Raider M.D.   On: 05/06/2021 11:16   DG ABD ACUTE 2+V W 1V CHEST  Result Date: 05/07/2021 CLINICAL DATA:  Small-bowel obstruction, abdominal pain EXAM: DG ABDOMEN ACUTE WITH 1 VIEW CHEST COMPARISON:  05/06/2021 FINDINGS: Improvement in gas distended loops of small bowel in the central abdomen, now measuring up to 4.8 cm, previously as large as 7.5 cm. There is scattered gas and stool present throughout the colon to the rectum. No free air in the abdomen. Mild cardiomegaly.  No acute abnormality of the lungs. IMPRESSION: 1. Improvement in gas distended loops of small bowel in the central abdomen, consistent with improved obstruction or ileus. There is scattered gas and stool present throughout the colon to the rectum. 2.  Cardiomegaly without acute abnormality of the lungs. Electronically Signed   By: Lauralyn Primes M.D.   On: 05/07/2021 10:21    Anti-infectives: Anti-infectives (From admission, onward)    None        Assessment/Plan Recurrent SBO - Clinically improving. Tolerating fulls and having bowel function. Trial soft diet.   ID - none FEN - d/c IVF, soft diet VTE - SCDs Foley - none  HTN Obesity BMI 37   LOS: 2 days    Franne Forts, [facility] Surgery 05/08/2021, 8:17 AM Please see Amion for pager number during day hours 7:00am-4:30pm

## 2021-05-08 NOTE — Progress Notes (Signed)
PROGRESS NOTE  TUDOR CHANDLEY QHU:[id number] DOB: 1971/06/25 DOA: 05/06/2021 PCP: Maryagnes Amos, FNP  Brief History   50 year old morbidly obese Male BMI 21, umbilical surgery age 44 with hernia Appendectomy age 51 Recent admission [facility] 9/13 through 9/15 SBO no NG tube placed Leander Rams presented to [facility] 9/19 with similar symptoms found to have SBO  The patient was admitted to a medical bed and surgery consulted. He has been restarted on a soft diet with BM yesterday and passing of flatus this morning.  Surgical plan is to to to OR if patient has a recurrence of SBO.  Consultants  General surgery  Procedures  NGT - now out.  Antibiotics   Anti-infectives (From admission, onward)    None       Subjective  The patient is resting comfortably. He is reticent to continue to try to eat due to feeling full after breakfast. He denies abdominal pain or nausea, but he states that he definitely has not appetite. He is continuing to ambulate in halls.  Objective   Vitals:  Vitals:   05/08/21 0859 05/08/21 1300  BP: (!) 134/95 (!) 132/96  Pulse: 69 (!) 53  Resp: 15   Temp: 99.1 F (37.3 C)   SpO2: 98%     Exam:  Constitutional:  The patient is awake, alert, and oriented x 3. No acute distress. Respiratory:  CTA bilaterally, no w/r/r.  Respiratory effort normal. No retractions or accessory muscle use Cardiovascular:  RRR, no m/r/g No LE extremity edema   Normal pedal pulses Abdomen:  Abdomen appears normal;  Abdomen is somewhat distended, non-tender to palpation Positive bowel sounds. No hernias No HSM Musculoskeletal:  Digits/nails BUE: no clubbing, cyanosis, petechiae, infection exam of joints, bones, muscles of at least one of following: head/neck, RUE, LUE, RLE, LLE   strength and tone normal, no atrophy, no abnormal movements Skin:  No rashes, lesions, ulcers palpation of skin: no induration or nodules Neurologic:  CN 2-12 intact Sensation  all 4 extremities intact Psychiatric:  Mental status Mood, affect appropriate Orientation to person, place, time  judgment and insight appear intact    I have personally reviewed the following:   Today's Data  Vitals  Lab Data  CMP, CBC  Imaging  CT abdomen and pelvis.  Cardiology Data  EKG  Scheduled Meds:  irbesartan  300 mg Oral Daily   And   chlorthalidone  25 mg Oral Daily   potassium chloride  40 mEq Oral Daily   Continuous Infusions:  Principal Problem:   SBO (small bowel obstruction) (HCC) Active Problems:   Hypertension   LOS: 2 days   A & P  SBO Resolved relatively quickly no current nausea vomiting passing stools and gas much like he did over at [facility] We will observe him as per general surgery team-diet as in addition to next steps in management as he may require surgery given recent multiple episodes of this recurrent Keep on saline 125 cc/H. Advanced to a soft diet by surgery. Continue to monitor. + BM and + flatus. Mild hypokalemia Replace with K. Dur 40 daily. Monitor. HTN Coreg has been held due to brady cardia. Chlorthalidone/irbesartan has been restarted. Impaired glucose tolerance Need to confirm if takes Ozempic at home on discharge   DVT prophylaxis: SCD Code Status: Full Family Communication: No Disposition:  Status is: Inpatient   Remains inpatient appropriate because:Unsafe d/c plan and IV treatments appropriate due to intensity of illness or inability to take PO  Dispo: The patient is from: Home              Anticipated d/c is to: Home              Patient currently is not medically stable to d/c.              Difficult to place patient No   Emiliana Blaize, DO [facility] Direct contact: see [email or web address]  7PM-7AM contact night coverage as above 05/08/2021, 7:32 PM  LOS: 2 days

## 2021-05-09 ENCOUNTER — Inpatient Hospital Stay (HOSPITAL_COMMUNITY): Payer: Medicare Other

## 2021-05-09 DIAGNOSIS — K56609 Unspecified intestinal obstruction, unspecified as to partial versus complete obstruction: Secondary | ICD-10-CM | POA: Diagnosis not present

## 2021-05-09 LAB — COMPREHENSIVE METABOLIC PANEL
ALT: 22 U/L (ref 0–44)
AST: 23 U/L (ref 15–41)
Albumin: 3.2 g/dL — ABNORMAL LOW (ref 3.5–5.0)
Alkaline Phosphatase: 61 U/L (ref 38–126)
Anion gap: 9 (ref 5–15)
BUN: <5 mg/dL — ABNORMAL LOW (ref 6–20)
CO2: 27 mmol/L (ref 22–32)
Calcium: 8.7 mg/dL — ABNORMAL LOW (ref 8.9–10.3)
Chloride: 101 mmol/L (ref 98–111)
Creatinine, Ser: 0.82 mg/dL (ref 0.61–1.24)
GFR, Estimated: >60 mL/min (ref >60)
Glucose, Bld: 92 mg/dL (ref 70–99)
Potassium: 3.2 mmol/L — ABNORMAL LOW (ref 3.5–5.1)
Sodium: 137 mmol/L (ref 135–145)
Total Bilirubin: 0.9 mg/dL (ref 0.3–1.2)
Total Protein: 6 g/dL — ABNORMAL LOW (ref 6.5–8.1)

## 2021-05-09 LAB — CBC WITH DIFFERENTIAL/PLATELET
Abs Immature Granulocytes: 0.01 10*3/uL (ref 0.00–0.07)
Basophils Absolute: 0 10*3/uL (ref 0.0–0.1)
Basophils Relative: 1 %
Eosinophils Absolute: 0.1 10*3/uL (ref 0.0–0.5)
Eosinophils Relative: 2 %
HCT: 40.2 % (ref 39.0–52.0)
Hemoglobin: 13 g/dL (ref 13.0–17.0)
Immature Granulocytes: 0 %
Lymphocytes Relative: 52 %
Lymphs Abs: 2.2 10*3/uL (ref 0.7–4.0)
MCH: 28.6 pg (ref 26.0–34.0)
MCHC: 32.3 g/dL (ref 30.0–36.0)
MCV: 88.4 fL (ref 80.0–100.0)
Monocytes Absolute: 0.5 10*3/uL (ref 0.1–1.0)
Monocytes Relative: 12 %
Neutro Abs: 1.4 10*3/uL — ABNORMAL LOW (ref 1.7–7.7)
Neutrophils Relative %: 33 %
Platelets: 122 10*3/uL — ABNORMAL LOW (ref 150–400)
RBC: 4.55 MIL/uL (ref 4.22–5.81)
RDW: 13.9 % (ref 11.5–15.5)
WBC: 4.3 10*3/uL (ref 4.0–10.5)
nRBC: 0 % (ref 0.0–0.2)

## 2021-05-09 MED ORDER — MELATONIN 5 MG PO TABS
5.0000 mg | ORAL_TABLET | Freq: Once | ORAL | Status: AC
  Administered 2021-05-09: 5 mg via ORAL
  Filled 2021-05-09: qty 1

## 2021-05-09 NOTE — Care Management Important Message (Signed)
Important Message  Patient Details  Name: Ricky [name] MRN: [id number] Date of Birth: 1971-01-08   Medicare Important Message Given:  Yes     Amellia Panik Stefan [facility] 05/09/2021, 3:16 PM

## 2021-05-09 NOTE — Progress Notes (Signed)
Patient ID: Ricky [name], male   DOB: [birth year], 50 y.o.   MRN: [id number]  [facility] Surgery Progress Note  2 Days Post-Op  Subjective: CC-  Feels fine this morning. He had a couple small Bms yesterday. Continues to pass flatus. Tolerating solid food. Denies abdominal pain, nausea, vomiting. States that he did get a little bloated after lunch yesterday, but this resolved with time and ambulating.   Objective: Vital signs in last 24 hours: Temp:  [98.6 F (37 C)-99.2 F (37.3 C)] 99 F (37.2 C) (09/22 0700) Pulse Rate:  [52-69] 52 (09/22 0700) Resp:  [15-18] 16 (09/22 0800) BP: (120-134)/(82-96) 134/89 (09/22 0700) SpO2:  [98 %-100 %] 99 % (09/22 0700) Last BM Date: 05/08/21 (around 4)  Intake/Output from previous day: 09/21 0701 - 09/22 0700 In: 480 [P.O.:480] Out: -  Intake/Output this shift: No intake/output data recorded.  PE: Gen:  Alert, NAD, pleasant Pulm: rate and effort normal Abd: Soft, obese, nontender  Lab Results:  Recent Labs    05/07/21 0832 05/09/21 0319  WBC 4.3 4.3  HGB 13.0 13.0  HCT 40.1 40.2  PLT 111* 122*   BMET Recent Labs    05/07/21 0832 05/09/21 0319  NA 138 137  K 2.9* 3.2*  CL 102 101  CO2 26 27  GLUCOSE 80 92  BUN 8 <5*  CREATININE 0.89 0.82  CALCIUM 8.6* 8.7*   PT/INR No results for input(s): LABPROT, INR in the last 72 hours. CMP     Component Value Date/Time   NA 137 05/09/2021 0319   NA 143 04/04/2021 1018   K 3.2 (L) 05/09/2021 0319   CL 101 05/09/2021 0319   CO2 27 05/09/2021 0319   GLUCOSE 92 05/09/2021 0319   BUN <5 (L) 05/09/2021 0319   BUN 12 04/04/2021 1018   CREATININE 0.82 05/09/2021 0319   CALCIUM 8.7 (L) 05/09/2021 0319   PROT 6.0 (L) 05/09/2021 0319   PROT 7.0 07/23/2020 1429   ALBUMIN 3.2 (L) 05/09/2021 0319   ALBUMIN 4.2 07/23/2020 1429   AST 23 05/09/2021 0319   ALT 22 05/09/2021 0319   ALKPHOS 61 05/09/2021 0319   BILITOT 0.9 05/09/2021 0319   BILITOT 0.4 07/23/2020 1429    GFRNONAA >60 05/09/2021 0319   GFRAA 120 10/01/2020 1633   Lipase     Component Value Date/Time   LIPASE 28 12/02/2019 2039       Studies/Results: DG ABD ACUTE 2+V W 1V CHEST  Result Date: 05/07/2021 CLINICAL DATA:  Small-bowel obstruction, abdominal pain EXAM: DG ABDOMEN ACUTE WITH 1 VIEW CHEST COMPARISON:  05/06/2021 FINDINGS: Improvement in gas distended loops of small bowel in the central abdomen, now measuring up to 4.8 cm, previously as large as 7.5 cm. There is scattered gas and stool present throughout the colon to the rectum. No free air in the abdomen. Mild cardiomegaly.  No acute abnormality of the lungs. IMPRESSION: 1. Improvement in gas distended loops of small bowel in the central abdomen, consistent with improved obstruction or ileus. There is scattered gas and stool present throughout the colon to the rectum. 2.  Cardiomegaly without acute abnormality of the lungs. Electronically Signed   By: Lauralyn Primes M.D.   On: 05/07/2021 10:21   DG Abd Portable 1V  Result Date: 05/08/2021 CLINICAL DATA:  Small bowel obstruction EXAM: PORTABLE ABDOMEN - 1 VIEW COMPARISON:  05/07/2021 FINDINGS: Interval decrease in gas distention of multiple loops of small bowel in the central abdomen, now measuring no greater  than 3.0 cm. There is gas and stool present in the colon to the rectum. No obvious free air on single supine radiograph. IMPRESSION: Interval decrease in gas distention of multiple loops of small bowel in the central abdomen, now measuring no greater than 3.0 cm. There is gas and stool present in the colon to the rectum. Electronically Signed   By: Lauralyn Primes M.D.   On: 05/08/2021 09:44    Anti-infectives: Anti-infectives (From admission, onward)    None        Assessment/Plan Recurrent SBO - Clinically improving - tolerating soft diet and having bowel function. He did have some bloating after lunch yesterday. Given the fact that this is recurrent we want to watch him  closely. Continue soft diet today. Repeat film. We will see how he feels later today.    ID - none FEN - soft diet VTE - SCDs, ok for chemical DVT prophylaxis from surgical standpoint Foley - none   HTN Obesity BMI 37 Hypokalemia   LOS: 3 days    Franne Forts, [facility] Surgery 05/09/2021, 8:35 AM Please see Amion for pager number during day hours 7:00am-4:30pm

## 2021-05-09 NOTE — Progress Notes (Signed)
PROGRESS NOTE  Ricky [name] CVE:[id number] DOB: [birth year] DOA: 05/06/2021 PCP: Maryagnes Amos, FNP  Brief History   50 year old morbidly obese Male BMI 74, umbilical surgery age 49 with hernia Appendectomy age 1 Recent admission [facility] 9/13 through 9/15 SBO no NG tube placed Leander Rams presented to [facility] 9/19 with similar symptoms found to have SBO  The patient was admitted to a medical bed and surgery consulted. He has been restarted on a soft diet with BM yesterday and passing of flatus this morning.  Diet was increased by general surgery this morning. The patient seems to be tolerating it well. If he is doing well in the am, he may go home. Otherwise he may require an exploratory laparoscopy.  Surgical plan is to to to OR if patient has a recurrence of SBO.  Consultants  General surgery  Procedures  NGT - now out.  Antibiotics   Anti-infectives (From admission, onward)    None       Subjective  The patient is resting comfortably. No new complaints. Tolerating increased diet well.  Objective   Vitals:  Vitals:   05/09/21 1400 05/09/21 1658  BP: 122/88 (!) 140/96  Pulse: 66 (!) 50  Resp: 14 16  Temp: 98.9 F (37.2 C) 98.3 F (36.8 C)  SpO2: 97% 100%    Exam:  Constitutional:  The patient is awake, alert, and oriented x 3. No acute distress. Respiratory:  CTA bilaterally, no w/r/r.  Respiratory effort normal. No retractions or accessory muscle use Cardiovascular:  RRR, no m/r/g No LE extremity edema   Normal pedal pulses Abdomen:  Abdomen appears normal;  Abdomen is somewhat distended, non-tender to palpation Positive bowel sounds. No hernias No HSM Musculoskeletal:  Digits/nails BUE: no clubbing, cyanosis, petechiae, infection exam of joints, bones, muscles of at least one of following: head/neck, RUE, LUE, RLE, LLE   strength and tone normal, no atrophy, no abnormal movements Skin:  No rashes, lesions, ulcers palpation of skin:  no induration or nodules Neurologic:  CN 2-12 intact Sensation all 4 extremities intact Psychiatric:  Mental status Mood, affect appropriate Orientation to person, place, time  judgment and insight appear intact    I have personally reviewed the following:   Today's Data  Vitals  Lab Data  CMP, CBC  Imaging  X-ray abdomen and pelvis  Cardiology Data  EKG  Scheduled Meds:  irbesartan  300 mg Oral Daily   And   chlorthalidone  25 mg Oral Daily   potassium chloride  40 mEq Oral Daily   Continuous Infusions:  Principal Problem:   SBO (small bowel obstruction) (HCC) Active Problems:   Hypertension   LOS: 3 days   A & P  SBO Resolved relatively quickly no current nausea vomiting passing stools and gas much like he did over at [facility] We will observe him as per general surgery team-diet as in addition to next steps in management as he may require surgery given recent multiple episodes of this recurrent Keep on saline 125 cc/H. Tolerating increased diet. Continue to monitor. + BM and + flatus. Mild hypokalemia Replace with K. Dur 40 daily. Monitor. HTN Coreg has been held due to brady cardia. Chlorthalidone/irbesartan has been restarted. Impaired glucose tolerance Need to confirm if takes Ozempic at home on discharge   DVT prophylaxis: SCD Code Status: Full Family Communication: No Disposition:  Status is: Inpatient   Remains inpatient appropriate because:Unsafe d/c plan and IV treatments appropriate due to intensity of illness or  inability to take PO   Dispo: The patient is from: Home              Anticipated d/c is to: Home              Patient currently is not medically stable to d/c.              Difficult to place patient No   Mialee Weyman, DO [facility] Direct contact: see [email or web address]  7PM-7AM contact night coverage as above 05/09/2021, 7:07 PM  LOS: 2 days

## 2021-05-10 DIAGNOSIS — K56609 Unspecified intestinal obstruction, unspecified as to partial versus complete obstruction: Secondary | ICD-10-CM | POA: Diagnosis not present

## 2021-05-10 LAB — BASIC METABOLIC PANEL
Anion gap: 7 (ref 5–15)
BUN: 6 mg/dL (ref 6–20)
CO2: 28 mmol/L (ref 22–32)
Calcium: 8.5 mg/dL — ABNORMAL LOW (ref 8.9–10.3)
Chloride: 102 mmol/L (ref 98–111)
Creatinine, Ser: 0.84 mg/dL (ref 0.61–1.24)
GFR, Estimated: >60 mL/min (ref >60)
Glucose, Bld: 104 mg/dL — ABNORMAL HIGH (ref 70–99)
Potassium: 3.2 mmol/L — ABNORMAL LOW (ref 3.5–5.1)
Sodium: 137 mmol/L (ref 135–145)

## 2021-05-10 MED ORDER — POTASSIUM CHLORIDE CRYS ER 20 MEQ PO TBCR: 40.0000 meq | EXTENDED_RELEASE_TABLET | Freq: Every day | ORAL | 3 refills | Status: DC

## 2021-05-10 NOTE — Discharge Summary (Signed)
Physician Discharge Summary  Ricky [name] [id number]:[id number] DOB: 02-02-1971 DOA: 05/06/2021  PCP: Maryagnes Amos, FNP  Admit date: 05/06/2021 Discharge date: 05/10/2021  Recommendations for Outpatient Follow-up:  The patient will follow up with PCP in 7-10 days. Follow up with general surgery if symptoms return.   Follow-up Information     Abigail Miyamoto, MD. Call.   Specialty: General Surgery Why: If symptoms worsen Contact information: [address] [place] [address] [phone]                Discharge Diagnoses: Principal diagnosis is #1 SBO - recurrent  Mild hypokalemia Hypertension Impaired glucose tolerance  Discharge Condition: Fair  Disposition: Home  Diet recommendation: Heart healthy soft diet.  Filed Weights   05/06/21 1250 05/06/21 2310 05/07/21 1117  Weight: 123.4 kg 124 kg 124 kg    History of present illness:  Ricky [name] is a 50 y.o. male with medical history significant of obesity, HTN, HLD.   Pt with recent admission to [facility] for SBO 9/13-9/15.  Managed conservatively with just bowel rest.  Seemed to improve and discharged. Pt presents to ED today with recurrent abd pain, distention.  Onset yesterday.  Last BM was yesterday.  No flatus today. Nauseated, no vomiting. Pain constant, gets up to 7/10 at times. Saw GI today, had KUB, sent to ED.   ED Course: CT AP confirms persistent SBO with transition point in RLQ.   Hospital Course:  The patient was admitted to a telemetry bed. Potassium was supplemented. General surgery was consulted. The patient was made NPO. He improved and had a BM on 05/07/2021, and his diet was advanced. His diet continued be advanced and the patient has continued to demonstrate bowel function. He was placed on a regular diet yesterday. This morning he continues to have regular bowel function. He is discharged to home in fair condition.  Today's assessment: S: The patient is sitting up in  bed. No new complaints.  O: Vitals:  Vitals:   05/10/21 0534 05/10/21 0810  BP: (!) 130/95 126/85  Pulse: (!) 57 (!) 56  Resp: 17 19  Temp: 99 F (37.2 C) 98.3 F (36.8 C)  SpO2: 97% 99%    Constitutional:  The patient is resting comfortably. No new complaints. Respiratory:  CTA bilaterally, no w/r/r.  Respiratory effort normal. No retractions or accessory muscle use Cardiovascular:  RRR, no m/r/g No LE extremity edema   Normal pedal pulses Abdomen:  Abdomen appears normal; no tenderness or masses No hernias No HSM Normoactive bowel sounds. Musculoskeletal:  Digits/nails: no clubbing, cyanosis, petechiae, infection exam of joints, bones, muscles of at least one of following: head/neck, RUE, LUE, RLE, LLE   strength and tone normal, no atrophy, no abnormal movements Skin:  No rashes, lesions, ulcers palpation of skin: no induration or nodules Neurologic:  CN 2-12 intact Sensation all 4 extremities intact Psychiatric:  judgement and insight appear normal Mental status Mood, affect appropriate Orientation to person, place, time  Discharge Instructions  Discharge Instructions     Activity as tolerated - No restrictions   Complete by: As directed    Call MD for:  persistant nausea and vomiting   Complete by: As directed    Call MD for:  severe uncontrolled pain   Complete by: As directed    Call MD for:  temperature >100.4   Complete by: As directed    Diet - low sodium heart healthy   Complete by: As directed  Discharge instructions   Complete by: As directed    Discharge to home Diet as tolerated Follow up with PCP in 7-10 days. Follow up with General Surgery as needed.   Increase activity slowly   Complete by: As directed       Allergies as of 05/10/2021   No Known Allergies      Medication List     STOP taking these medications    carvedilol 25 MG tablet Commonly known as: COREG       TAKE these medications     Azilsartan-Chlorthalidone 40-25 MG Tabs Take 1 tablet by mouth daily. Notes to patient: Blood pressure   Blood Pressure Cuff Misc XL Adult blood pressure cuff   ibuprofen 200 MG tablet Commonly known as: ADVIL Take 400 mg by mouth every 6 (six) hours as needed for fever, headache or mild pain. Notes to patient: Mild pain   Insulin Pen Needle 32G X 4 MM Misc Use to inject Ozempic once weekly Notes to patient: Diabetes management   potassium chloride SA 20 MEQ tablet Commonly known as: KLOR-CON Take 2 tablets (40 mEq total) by mouth daily. What changed: how much to take Notes to patient: supplement   rosuvastatin 10 MG tablet Commonly known as: CRESTOR TAKE 1 TABLET(10 MG) BY MOUTH DAILY What changed: See the new instructions.   sildenafil 100 MG tablet Commonly known as: VIAGRA TAKE 1/2 TO 1 TABLET BY MOUTH AS NEEDED. Defer further refills to PCP. What changed:  how much to take how to take this when to take this reasons to take this additional instructions       No Known Allergies  The results of significant diagnostics from this hospitalization (including imaging, microbiology, ancillary and laboratory) are listed below for reference.    Significant Diagnostic Studies: CT ABDOMEN PELVIS W CONTRAST  Result Date: 05/06/2021 CLINICAL DATA:  Bowel obstruction suspected. EXAM: CT ABDOMEN AND PELVIS WITH CONTRAST TECHNIQUE: Multidetector CT imaging of the abdomen and pelvis was performed using the standard protocol following bolus administration of intravenous contrast. CONTRAST:  40mL OMNIPAQUE IOHEXOL 350 MG/ML SOLN COMPARISON:  CT Abdomen Pelvis, 04/30/2021 and 03/27/2021. FINDINGS: Lower chest: No acute abnormality. Hepatobiliary: No focal liver abnormality is seen. No gallstones, gallbladder wall thickening, or biliary dilatation. Pancreas: Unremarkable. No pancreatic ductal dilatation or surrounding inflammatory changes. Spleen: Normal in size without focal  abnormality. Adrenals/Urinary Tract: Adrenal glands are unremarkable. Kidneys are normal, without renal calculi, focal lesion, or hydronephrosis. Bladder is unremarkable. Stomach/Bowel: Small hiatus hernia.  Stomach is decompressed. Persistence of air and-fluid distention of multiple small bowel loops, with focal transition within the RIGHT lower quadrant (# 2, 65/# 6, 69). No evidence of perforation, or overt vascular compromise. The appendix is not well visualized. Nondilated colon. Mild colonic diverticulosis. Vascular/Lymphatic: No significant vascular findings are present. No enlarged abdominal or pelvic lymph nodes. Reproductive: Prostate is unremarkable. Other: Umbilical herniorrhaphy. No abdominal wall hernia or abnormality. No abdominopelvic ascites. Musculoskeletal: Small RIGHT anterior abdominal wall contusions, likely injection sites. No acute or significant osseous findings. Degenerative changes of the spine, including L4-L5 facet arthropathy and posterior fusion. IMPRESSION: Persistent small bowel obstruction with discrete transition within the RIGHT lower quadrant (see key images). No evidence of perforation or overt vascular compromise. Electronically Signed   By: Roanna Banning M.D.   On: 05/06/2021 16:37   DG Abd 2 Views  Result Date: 05/06/2021 CLINICAL DATA:  Abdominal pain, bloating. EXAM: ABDOMEN - 2 VIEW COMPARISON:  None. FINDINGS: Dilated small bowel  loops are noted with air-fluid levels concerning for distal small bowel obstruction. No colonic dilatation is noted. There is no evidence of free air. No radio-opaque calculi or other significant radiographic abnormality is seen. IMPRESSION: Dilated small bowel loops are noted with air-fluid levels concerning for distal small bowel obstruction. Electronically Signed   By: Lupita Raider M.D.   On: 05/06/2021 11:16   DG ABD ACUTE 2+V W 1V CHEST  Result Date: 05/07/2021 CLINICAL DATA:  Small-bowel obstruction, abdominal pain EXAM: DG  ABDOMEN ACUTE WITH 1 VIEW CHEST COMPARISON:  05/06/2021 FINDINGS: Improvement in gas distended loops of small bowel in the central abdomen, now measuring up to 4.8 cm, previously as large as 7.5 cm. There is scattered gas and stool present throughout the colon to the rectum. No free air in the abdomen. Mild cardiomegaly.  No acute abnormality of the lungs. IMPRESSION: 1. Improvement in gas distended loops of small bowel in the central abdomen, consistent with improved obstruction or ileus. There is scattered gas and stool present throughout the colon to the rectum. 2.  Cardiomegaly without acute abnormality of the lungs. Electronically Signed   By: Lauralyn Primes M.D.   On: 05/07/2021 10:21   DG Abd Portable 1V  Result Date: 05/09/2021 CLINICAL DATA:  Small bowel obstruction EXAM: PORTABLE ABDOMEN - 1 VIEW COMPARISON:  05/08/2021 FINDINGS: Continued interval improvement of gas-filled, although not overtly distended loops of small bowel in the central abdomen, measuring up to 2.6 cm in caliber. Gas and stool is present to the rectum. No free air in the abdomen on supine radiographs. IMPRESSION: Continued interval improvement of gas-filled, although not overtly distended loops of small bowel in the central abdomen, measuring up to 2.6 cm in caliber. Gas and stool is present to the rectum. Electronically Signed   By: Lauralyn Primes M.D.   On: 05/09/2021 11:36   DG Abd Portable 1V  Result Date: 05/08/2021 CLINICAL DATA:  Small bowel obstruction EXAM: PORTABLE ABDOMEN - 1 VIEW COMPARISON:  05/07/2021 FINDINGS: Interval decrease in gas distention of multiple loops of small bowel in the central abdomen, now measuring no greater than 3.0 cm. There is gas and stool present in the colon to the rectum. No obvious free air on single supine radiograph. IMPRESSION: Interval decrease in gas distention of multiple loops of small bowel in the central abdomen, now measuring no greater than 3.0 cm. There is gas and stool present  in the colon to the rectum. Electronically Signed   By: Lauralyn Primes M.D.   On: 05/08/2021 09:44    Microbiology: Recent Results (from the past 240 hour(s))  Resp Panel by RT-PCR (Flu A&B, Covid) Nasopharyngeal Swab     Status: None   Collection Time: 05/06/21  2:38 PM   Specimen: Nasopharyngeal Swab; Nasopharyngeal(NP) swabs in vial transport medium  Result Value Ref Range Status   SARS Coronavirus 2 by RT PCR NEGATIVE NEGATIVE Final    Comment: (NOTE) SARS-CoV-2 target nucleic acids are NOT DETECTED.  The SARS-CoV-2 RNA is generally detectable in upper respiratory specimens during the acute phase of infection. The lowest concentration of SARS-CoV-2 viral copies this assay can detect is 138 copies/mL. A negative result does not preclude SARS-Cov-2 infection and should not be used as the sole basis for treatment or other patient management decisions. A negative result may occur with  improper specimen collection/handling, submission of specimen other than nasopharyngeal swab, presence of viral mutation(s) within the areas targeted by this assay, and inadequate number of  viral copies(<138 copies/mL). A negative result must be combined with clinical observations, patient history, and epidemiological information. The expected result is Negative.  Fact Sheet for Patients:  BloggerCourse.com  Fact Sheet for Healthcare Providers:  SeriousBroker.it  This test is no t yet approved or cleared by the [place] FDA and  has been authorized for detection and/or diagnosis of SARS-CoV-2 by FDA under an Emergency Use Authorization (EUA). This EUA will remain  in effect (meaning this test can be used) for the duration of the COVID-19 declaration under Section 564(b)(1) of the Act, 21 U.S.C.section 360bbb-3(b)(1), unless the authorization is terminated  or revoked sooner.       Influenza A by PCR NEGATIVE NEGATIVE Final   Influenza B by  PCR NEGATIVE NEGATIVE Final    Comment: (NOTE) The Xpert Xpress SARS-CoV-2/FLU/RSV plus assay is intended as an aid in the diagnosis of influenza from Nasopharyngeal swab specimens and should not be used as a sole basis for treatment. Nasal washings and aspirates are unacceptable for Xpert Xpress SARS-CoV-2/FLU/RSV testing.  Fact Sheet for Patients: BloggerCourse.com  Fact Sheet for Healthcare Providers: SeriousBroker.it  This test is not yet approved or cleared by the [place] FDA and has been authorized for detection and/or diagnosis of SARS-CoV-2 by FDA under an Emergency Use Authorization (EUA). This EUA will remain in effect (meaning this test can be used) for the duration of the COVID-19 declaration under Section 564(b)(1) of the Act, 21 U.S.C. section 360bbb-3(b)(1), unless the authorization is terminated or revoked.  Performed at [facility], [address] [place], [place] [address]      Labs: Basic Metabolic Panel: Recent Labs  Lab 05/06/21 1438 05/07/21 0832 05/09/21 0319 05/10/21 0020  NA 140 138 137 137  K 3.5 2.9* 3.2* 3.2*  CL 101 102 101 102  CO2 31 26 27 28   GLUCOSE 96 80 92 104*  BUN 15 8 <5* 6  CREATININE 0.94 0.89 0.82 0.84  CALCIUM 9.4 8.6* 8.7* 8.5*   Liver Function Tests: Recent Labs  Lab 05/06/21 1438 05/07/21 0832 05/09/21 0319  AST 29 28 23   ALT 26 27 22   ALKPHOS 68 59 61  BILITOT 0.9 1.3* 0.9  PROT 7.4 6.5 6.0*  ALBUMIN 4.4 3.4* 3.2*   No results for input(s): LIPASE, AMYLASE in the last 168 hours. No results for input(s): AMMONIA in the last 168 hours. CBC: Recent Labs  Lab 05/06/21 1438 05/06/21 2221 05/07/21 0832 05/09/21 0319  WBC 5.4 5.5 4.3 4.3  NEUTROABS 2.3  --  1.7 1.4*  HGB 14.1 14.0 13.0 13.0  HCT 43.9 44.3 40.1 40.2  MCV 88.9 90.2 88.3 88.4  PLT 126* 118* 111* 122*   Cardiac Enzymes: No results for input(s): CKTOTAL, CKMB,  CKMBINDEX, TROPONINI in the last 168 hours. BNP: BNP (last 3 results) No results for input(s): BNP in the last 8760 hours.  ProBNP (last 3 results) No results for input(s): PROBNP in the last 8760 hours.  CBG: No results for input(s): GLUCAP in the last 168 hours.  Principal Problem:   SBO (small bowel obstruction) (HCC) Active Problems:   Hypertension   Time coordinating discharge: 38 minutes.  Signed:        Ariaunna Longsworth, DO [facility]  05/10/2021, 2:43 PM

## 2021-05-10 NOTE — Progress Notes (Signed)
Patient ID: Ricky [name], male   DOB: Jul 03, 1971, 50 y.o.   MRN: [id number]  [facility] Surgery Progress Note  3 Days Post-Op  Subjective: CC-  Feeling better each day. States that he ate more yesterday and did well. No recurrence of abdominal pain, n/v. Continues to pass flatus and had a large BM yesterday.  Objective: Vital signs in last 24 hours: Temp:  [98 F (36.7 C)-99 F (37.2 C)] 98.3 F (36.8 C) (09/23 0810) Pulse Rate:  [50-66] 56 (09/23 0810) Resp:  [14-19] 19 (09/23 0810) BP: (122-140)/(85-100) 126/85 (09/23 0810) SpO2:  [97 %-100 %] 99 % (09/23 0810) Last BM Date: 05/09/21 (2 large BM per pt)  Intake/Output from previous day: 09/22 0701 - 09/23 0700 In: 360 [P.O.:360] Out: -  Intake/Output this shift: No intake/output data recorded.  PE: Gen:  Alert, NAD, pleasant Pulm: rate and effort normal Abd: Soft, obese, nontender  Lab Results:  Recent Labs    05/09/21 0319  WBC 4.3  HGB 13.0  HCT 40.2  PLT 122*   BMET Recent Labs    05/09/21 0319 05/10/21 0020  NA 137 137  K 3.2* 3.2*  CL 101 102  CO2 27 28  GLUCOSE 92 104*  BUN <5* 6  CREATININE 0.82 0.84  CALCIUM 8.7* 8.5*   PT/INR No results for input(s): LABPROT, INR in the last 72 hours. CMP     Component Value Date/Time   NA 137 05/10/2021 0020   NA 143 04/04/2021 1018   K 3.2 (L) 05/10/2021 0020   CL 102 05/10/2021 0020   CO2 28 05/10/2021 0020   GLUCOSE 104 (H) 05/10/2021 0020   BUN 6 05/10/2021 0020   BUN 12 04/04/2021 1018   CREATININE 0.84 05/10/2021 0020   CALCIUM 8.5 (L) 05/10/2021 0020   PROT 6.0 (L) 05/09/2021 0319   PROT 7.0 07/23/2020 1429   ALBUMIN 3.2 (L) 05/09/2021 0319   ALBUMIN 4.2 07/23/2020 1429   AST 23 05/09/2021 0319   ALT 22 05/09/2021 0319   ALKPHOS 61 05/09/2021 0319   BILITOT 0.9 05/09/2021 0319   BILITOT 0.4 07/23/2020 1429   GFRNONAA >60 05/10/2021 0020   GFRAA 120 10/01/2020 1633   Lipase     Component Value Date/Time   LIPASE 28  12/02/2019 2039       Studies/Results: DG Abd Portable 1V  Result Date: 05/09/2021 CLINICAL DATA:  Small bowel obstruction EXAM: PORTABLE ABDOMEN - 1 VIEW COMPARISON:  05/08/2021 FINDINGS: Continued interval improvement of gas-filled, although not overtly distended loops of small bowel in the central abdomen, measuring up to 2.6 cm in caliber. Gas and stool is present to the rectum. No free air in the abdomen on supine radiographs. IMPRESSION: Continued interval improvement of gas-filled, although not overtly distended loops of small bowel in the central abdomen, measuring up to 2.6 cm in caliber. Gas and stool is present to the rectum. Electronically Signed   By: Lauralyn Primes M.D.   On: 05/09/2021 11:36    Anti-infectives: Anti-infectives (From admission, onward)    None        Assessment/Plan Recurrent SBO - Clinically improving - tolerating soft diet and having bowel function. No recurrence of abdominal bloating, n/v after eating more yesterday. Would not recommend surgery at this time. Ok for discharge. Patient knows that if his symptoms recur he will need to return to the hospital.    ID - none FEN - soft diet VTE - SCDs, ok for chemical DVT prophylaxis from  surgical standpoint Foley - none   HTN Obesity BMI 37 Hypokalemia   LOS: 4 days    Franne Forts, [facility] Surgery 05/10/2021, 8:50 AM Please see Amion for pager number during day hours 7:00am-4:30pm

## 2021-05-10 NOTE — Progress Notes (Signed)
Patient discharged home.  Patient provided with discharge instructions/AVS.  Patient verified pharmacy where prescriptions were electronically sent.  Patient able to verbalize understanding of all medication changes, follow up appointments and when to notify the doctor.  PIV removed.  Patient tolerated well.  Patient able to leave the unit independently accompanied by his son.

## 2021-05-23 ENCOUNTER — Other Ambulatory Visit: Payer: Self-pay

## 2021-05-23 ENCOUNTER — Encounter (HOSPITAL_BASED_OUTPATIENT_CLINIC_OR_DEPARTMENT_OTHER): Payer: Self-pay | Admitting: Emergency Medicine

## 2021-05-23 ENCOUNTER — Inpatient Hospital Stay (HOSPITAL_BASED_OUTPATIENT_CLINIC_OR_DEPARTMENT_OTHER)
Admission: EM | Admit: 2021-05-23 | Discharge: 2021-05-27 | DRG: 331 | Disposition: A | Discharge status: Home / Self Care | Payer: Medicare Other | Attending: Internal Medicine | Admitting: Internal Medicine

## 2021-05-23 ENCOUNTER — Emergency Department (HOSPITAL_BASED_OUTPATIENT_CLINIC_OR_DEPARTMENT_OTHER): Payer: Medicare Other

## 2021-05-23 DIAGNOSIS — E876 Hypokalemia: Secondary | ICD-10-CM | POA: Diagnosis present

## 2021-05-23 DIAGNOSIS — Z79899 Other long term (current) drug therapy: Secondary | ICD-10-CM

## 2021-05-23 DIAGNOSIS — I10 Essential (primary) hypertension: Secondary | ICD-10-CM | POA: Diagnosis present

## 2021-05-23 DIAGNOSIS — K5651 Intestinal adhesions [bands], with partial obstruction: Secondary | ICD-10-CM | POA: Diagnosis present

## 2021-05-23 DIAGNOSIS — K56609 Unspecified intestinal obstruction, unspecified as to partial versus complete obstruction: Secondary | ICD-10-CM | POA: Diagnosis not present

## 2021-05-23 DIAGNOSIS — Z87891 Personal history of nicotine dependence: Secondary | ICD-10-CM | POA: Diagnosis not present

## 2021-05-23 DIAGNOSIS — Z981 Arthrodesis status: Secondary | ICD-10-CM

## 2021-05-23 DIAGNOSIS — Z83438 Family history of other disorder of lipoprotein metabolism and other lipidemia: Secondary | ICD-10-CM

## 2021-05-23 DIAGNOSIS — Z20822 Contact with and (suspected) exposure to covid-19: Secondary | ICD-10-CM | POA: Diagnosis present

## 2021-05-23 DIAGNOSIS — Z8249 Family history of ischemic heart disease and other diseases of the circulatory system: Secondary | ICD-10-CM

## 2021-05-23 DIAGNOSIS — E782 Mixed hyperlipidemia: Secondary | ICD-10-CM | POA: Diagnosis present

## 2021-05-23 LAB — CBC WITH DIFFERENTIAL/PLATELET
Abs Immature Granulocytes: 0.01 10*3/uL (ref 0.00–0.07)
Basophils Absolute: 0 10*3/uL (ref 0.0–0.1)
Basophils Relative: 0 %
Eosinophils Absolute: 0.1 10*3/uL (ref 0.0–0.5)
Eosinophils Relative: 1 %
HCT: 44.2 % (ref 39.0–52.0)
Hemoglobin: 14 g/dL (ref 13.0–17.0)
Immature Granulocytes: 0 %
Lymphocytes Relative: 39 %
Lymphs Abs: 2.4 10*3/uL (ref 0.7–4.0)
MCH: 28.3 pg (ref 26.0–34.0)
MCHC: 31.7 g/dL (ref 30.0–36.0)
MCV: 89.3 fL (ref 80.0–100.0)
Monocytes Absolute: 0.7 10*3/uL (ref 0.1–1.0)
Monocytes Relative: 11 %
Neutro Abs: 2.9 10*3/uL (ref 1.7–7.7)
Neutrophils Relative %: 49 %
Platelets: 130 10*3/uL — ABNORMAL LOW (ref 150–400)
RBC: 4.95 MIL/uL (ref 4.22–5.81)
RDW: 14.5 % (ref 11.5–15.5)
WBC: 6 10*3/uL (ref 4.0–10.5)
nRBC: 0 % (ref 0.0–0.2)

## 2021-05-23 LAB — COMPREHENSIVE METABOLIC PANEL
ALT: 23 U/L (ref 0–44)
AST: 23 U/L (ref 15–41)
Albumin: 4.2 g/dL (ref 3.5–5.0)
Alkaline Phosphatase: 70 U/L (ref 38–126)
Anion gap: 10 (ref 5–15)
BUN: 14 mg/dL (ref 6–20)
CO2: 28 mmol/L (ref 22–32)
Calcium: 8.6 mg/dL — ABNORMAL LOW (ref 8.9–10.3)
Chloride: 102 mmol/L (ref 98–111)
Creatinine, Ser: 0.8 mg/dL (ref 0.61–1.24)
GFR, Estimated: >60 mL/min (ref >60)
Glucose, Bld: 89 mg/dL (ref 70–99)
Potassium: 3 mmol/L — ABNORMAL LOW (ref 3.5–5.1)
Sodium: 140 mmol/L (ref 135–145)
Total Bilirubin: 0.9 mg/dL (ref 0.3–1.2)
Total Protein: 7.1 g/dL (ref 6.5–8.1)

## 2021-05-23 LAB — LIPASE, BLOOD: Lipase: 20 U/L (ref 11–51)

## 2021-05-23 LAB — RESP PANEL BY RT-PCR (FLU A&B, COVID) ARPGX2
Influenza A by PCR: NEGATIVE
Influenza B by PCR: NEGATIVE
SARS Coronavirus 2 by RT PCR: NEGATIVE

## 2021-05-23 MED ORDER — LACTATED RINGERS IV BOLUS
1000.0000 mL | Freq: Once | INTRAVENOUS | Status: AC
  Administered 2021-05-23: 1000 mL via INTRAVENOUS

## 2021-05-23 MED ORDER — POTASSIUM CHLORIDE 10 MEQ/100ML IV SOLN
10.0000 meq | Freq: Once | INTRAVENOUS | Status: DC
  Filled 2021-05-23: qty 100

## 2021-05-23 MED ORDER — HYDRALAZINE HCL 20 MG/ML IJ SOLN: 10.0000 mg | Freq: Four times a day (QID) | INTRAMUSCULAR | Status: DC | PRN

## 2021-05-23 MED ORDER — POTASSIUM CHLORIDE 2 MEQ/ML IV SOLN
INTRAVENOUS | Status: DC
  Filled 2021-05-23 (×2): qty 1000

## 2021-05-23 MED ORDER — POTASSIUM CHLORIDE 10 MEQ/100ML IV SOLN
10.0000 meq | Freq: Once | INTRAVENOUS | Status: AC
  Administered 2021-05-23: 10 meq via INTRAVENOUS
  Filled 2021-05-23: qty 100

## 2021-05-23 MED ORDER — IOHEXOL 300 MG/ML  SOLN
100.0000 mL | Freq: Once | INTRAMUSCULAR | Status: AC | PRN
  Administered 2021-05-23: 100 mL via INTRAVENOUS

## 2021-05-23 NOTE — ED Notes (Signed)
[facility] at bedside 

## 2021-05-23 NOTE — ED Triage Notes (Signed)
Pt bib [facility] from [facility] d/t small bowel obstruction.

## 2021-05-23 NOTE — Assessment & Plan Note (Addendum)
Resume patients home regimen or oral antihypertensives

## 2021-05-23 NOTE — ED Notes (Signed)
ED Provider at bedside. 

## 2021-05-23 NOTE — ED Triage Notes (Signed)
Patient states he was here a couple weeks ago with SBO. Was admitting and discharged on the 23rd of last month. States 3 days after DC started having decreased appetite and feeling like abdomen is filling up much faster with little food intake. States he threw up last night and still having BM yesterday and today.

## 2021-05-23 NOTE — H&P (View-Only) (Signed)
Ricky [name] 1971/08/16  [id number].    Requesting MD: Dr. Renaye Rakers Chief Complaint/Reason for Consult: Small bowel obstruction  HPI:  Ricky [name] is a 50 yo male who presented to the ED today with concern for a recurrent small bowel obstruction.  He was recently admitted [facility] in August of this year with a small bowel obstruction, which resolved with bowel rest and nonoperative management.  He was then admitted here from 9/20-9/23 with recurrent symptoms of abdominal pain, bloating, and early satiety.  He was again treated with bowel rest and his symptoms improved with nonoperative management.  Surgery was discussed if his symptoms fail to improve.  Yesterday he again began having crampy abdominal pain with bloating and early satiety.  Yesterday he ate spaghetti and apple sauce and vomited shortly afterward.  He had a normal bowel movement 2 days ago and then no bowel movements or flatus until today, at which time he began having multiple loose watery bowel movements.  His pain has resolved and he denies any pain currently.  He has not had any nausea or vomiting today.  He says these are the same symptoms that he had during his last 2 admissions.  He has had weight loss with decreased p.o. intake since July due to the symptoms.  He presented to the ED today.  Labs were unremarkable.  A CT scan showed dilated loops of small bowel with a possible mesenteric swirl.  General surgery was consulted.  Prior abdominal surgeries include an umbilical hernia repair at age 34 and an open appendectomy at age 73.  ROS: Review of Systems  Constitutional:  Negative for chills and fever.  Respiratory:  Negative for shortness of breath, wheezing and stridor.   Cardiovascular:  Negative for chest pain.  Gastrointestinal:  Positive for abdominal pain, nausea and vomiting.  Neurological:  Negative for seizures and weakness.   Family History  Problem Relation Age of Onset   Diabetes Mother    Hypertension  Mother    Hyperlipidemia Mother    Stroke Mother    Anxiety disorder Mother    Obesity Mother    Hypertension Father    Hyperlipidemia Father     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   Eval Dr Trey Sailors, Neurosurgery 10/13   Chest pain    Depression    High cholesterol    Hypertension    Osteoarthritis    Osteoarthritis    Overweight    SOB (shortness of breath)    Thrombocytopenia (HCC) 07/07/2012   138,000 08/26/11; 98,000 06/29/12    Past Surgical History:  Procedure Laterality Date   ADENOIDECTOMY     ANTERIOR FUSION CERVICAL SPINE     APPENDECTOMY     HERNIA REPAIR     IRRIGATION AND DEBRIDEMENT SEBACEOUS CYST     on scalp   KNEE ARTHROSCOPY     x2   LUMBAR FUSION     POSTERIOR FUSION CERVICAL SPINE     TONSILLECTOMY      Social History:  reports that he quit smoking about 9 years ago. His smoking use included cigarettes. He has a 3.00 pack-year smoking history. He has never used smokeless tobacco. He reports that he does not drink alcohol and does not use drugs.  Allergies: No Known Allergies  (Not in a hospital admission)    Physical Exam: Blood pressure (!) 142/94, pulse 60, temperature 98.4  F (36.9 C), temperature source Oral, resp. rate 16, height 6\' 1"  (1.854 m), weight 124 kg, SpO2 98 %. General: resting comfortably, appears stated age, no apparent distress Neurological: alert and oriented, no focal deficits, cranial nerves grossly in tact HEENT: normocephalic, atraumatic, oropharynx clear, no scleral icterus CV: Mildly bradycardic in the 50s, extremities warm and well-perfused Respiratory: normal work of breathing, symmetric chest wall expansion Abdomen: soft, nondistended, nontender to deep palpation. No masses or organomegaly.  Well-healed umbilical and right lower quadrant surgical scars. Extremities: warm and well-perfused, no deformities, moving all extremities  spontaneously Psychiatric: normal mood and affect Skin: warm and dry, no jaundice, no rashes or lesions   Results for orders placed or performed during the hospital encounter of 05/23/21 (from the past 48 hour(s))  CBC with Differential/Platelet     Status: Abnormal   Collection Time: 05/23/21  3:41 PM  Result Value Ref Range   WBC 6.0 4.0 - 10.5 K/uL   RBC 4.95 4.22 - 5.81 MIL/uL   Hemoglobin 14.0 13.0 - 17.0 g/dL   HCT 07/23/21 35.5 - 73.2 %   MCV 89.3 80.0 - 100.0 fL   MCH 28.3 26.0 - 34.0 pg   MCHC 31.7 30.0 - 36.0 g/dL   RDW 20.2 54.2 - 70.6 %   Platelets 130 (L) 150 - 400 K/uL   nRBC 0.0 0.0 - 0.2 %   Neutrophils Relative % 49 %   Neutro Abs 2.9 1.7 - 7.7 K/uL   Lymphocytes Relative 39 %   Lymphs Abs 2.4 0.7 - 4.0 K/uL   Monocytes Relative 11 %   Monocytes Absolute 0.7 0.1 - 1.0 K/uL   Eosinophils Relative 1 %   Eosinophils Absolute 0.1 0.0 - 0.5 K/uL   Basophils Relative 0 %   Basophils Absolute 0.0 0.0 - 0.1 K/uL   WBC Morphology MORPHOLOGY UNREMARKABLE    RBC Morphology MORPHOLOGY UNREMARKABLE    Immature Granulocytes 0 %   Abs Immature Granulocytes 0.01 0.00 - 0.07 K/uL    Comment: Performed at [facility], [address] [place], [place]  Comprehensive metabolic panel     Status: Abnormal   Collection Time: 05/23/21  3:41 PM  Result Value Ref Range   Sodium 140 135 - 145 mmol/L   Potassium 3.0 (L) 3.5 - 5.1 mmol/L   Chloride 102 98 - 111 mmol/L   CO2 28 22 - 32 mmol/L   Glucose, Bld 89 70 - 99 mg/dL    Comment: Glucose reference range applies only to samples taken after fasting for at least 8 hours.   BUN 14 6 - 20 mg/dL   Creatinine, Ser 07/23/21 0.61 - 1.24 mg/dL   Calcium 8.6 (L) 8.9 - 10.3 mg/dL   Total Protein 7.1 6.5 - 8.1 g/dL   Albumin 4.2 3.5 - 5.0 g/dL   AST 23 15 - 41 U/L   ALT 23 0 - 44 U/L   Alkaline Phosphatase 70 38 - 126 U/L   Total Bilirubin 0.9 0.3 - 1.2 mg/dL   GFR, Estimated 5.17 >61 mL/min    Comment:  (NOTE) Calculated using the CKD-EPI Creatinine Equation (2021)    Anion gap 10 5 - 15    Comment: Performed at [facility], [address] [place], [place]  Lipase, blood     Status: None   Collection Time: 05/23/21  3:41 PM  Result Value Ref Range   Lipase 20 11 - 51 U/L    Comment: Performed  at [facility], [address] [place], [place] [address]  Resp Panel by RT-PCR (Flu A&B, Covid) Nasopharyngeal Swab     Status: None   Collection Time: 05/23/21  9:23 PM   Specimen: Nasopharyngeal Swab; Nasopharyngeal(NP) swabs in vial transport medium  Result Value Ref Range   SARS Coronavirus 2 by RT PCR NEGATIVE NEGATIVE    Comment: (NOTE) SARS-CoV-2 target nucleic acids are NOT DETECTED.  The SARS-CoV-2 RNA is generally detectable in upper respiratory specimens during the acute phase of infection. The lowest concentration of SARS-CoV-2 viral copies this assay can detect is 138 copies/mL. A negative result does not preclude SARS-Cov-2 infection and should not be used as the sole basis for treatment or other patient management decisions. A negative result may occur with  improper specimen collection/handling, submission of specimen other than nasopharyngeal swab, presence of viral mutation(s) within the areas targeted by this assay, and inadequate number of viral copies(<138 copies/mL). A negative result must be combined with clinical observations, patient history, and epidemiological information. The expected result is Negative.  Fact Sheet for Patients:  BloggerCourse.com  Fact Sheet for Healthcare Providers:  SeriousBroker.it  This test is no t yet approved or cleared by the [place] FDA and  has been authorized for detection and/or diagnosis of SARS-CoV-2 by FDA under an Emergency Use Authorization (EUA). This EUA will remain  in effect (meaning this test can be used) for  the duration of the COVID-19 declaration under Section 564(b)(1) of the Act, 21 U.S.C.section 360bbb-3(b)(1), unless the authorization is terminated  or revoked sooner.       Influenza A by PCR NEGATIVE NEGATIVE   Influenza B by PCR NEGATIVE NEGATIVE    Comment: (NOTE) The Xpert Xpress SARS-CoV-2/FLU/RSV plus assay is intended as an aid in the diagnosis of influenza from Nasopharyngeal swab specimens and should not be used as a sole basis for treatment. Nasal washings and aspirates are unacceptable for Xpert Xpress SARS-CoV-2/FLU/RSV testing.  Fact Sheet for Patients: BloggerCourse.com  Fact Sheet for Healthcare Providers: SeriousBroker.it  This test is not yet approved or cleared by the [place] FDA and has been authorized for detection and/or diagnosis of SARS-CoV-2 by FDA under an Emergency Use Authorization (EUA). This EUA will remain in effect (meaning this test can be used) for the duration of the COVID-19 declaration under Section 564(b)(1) of the Act, 21 U.S.C. section 360bbb-3(b)(1), unless the authorization is terminated or revoked.  Performed at [facility], [address], [place], [place] [address]    CT ABDOMEN PELVIS W CONTRAST  Result Date: 05/23/2021 CLINICAL DATA:  Bowel obstruction suspected. EXAM: CT ABDOMEN AND PELVIS WITH CONTRAST TECHNIQUE: Multidetector CT imaging of the abdomen and pelvis was performed using the standard protocol following bolus administration of intravenous contrast. CONTRAST:  OMNIPAQUE IOHEXOL 300 MG/ML  SOLN COMPARISON:  CT abdomen and pelvis 05/06/2020. FINDINGS: Lower chest: No acute abnormality. Hepatobiliary: No focal liver abnormality is seen. No gallstones, gallbladder wall thickening, or biliary dilatation. Pancreas: Unremarkable. No pancreatic ductal dilatation or surrounding inflammatory changes. Spleen: Normal in size without focal abnormality.  Adrenals/Urinary Tract: Adrenal glands are unremarkable. Kidneys are normal, without renal calculi, focal lesion, or hydronephrosis. Bladder is unremarkable. Stomach/Bowel: There are dilated small bowel loops measuring up to 5.6 cm. Transition point is visualized visualized in the central abdomen image 3/34. Transition point is also visualized in the central abdomen image 3/51. There is some swirling of central mesentery just inferior to this level. Small bowel loops  proximal and distal to this level appear decompressed. Stomach is nondilated. Colon is nondilated. The appendix is not definitely visualized. No pneumatosis or free air. No significant mesenteric edema. Vascular/Lymphatic: No significant vascular findings are present. No enlarged abdominal or pelvic lymph nodes. Reproductive: Prostate is unremarkable. Other: No abdominal wall hernia or abnormality. No abdominopelvic ascites. Musculoskeletal: L4-L5 lumbar fusion hardware is present. IMPRESSION: 1. Dilated central small bowel loops. Proximal and distal transition points are present with some swirling of the central mesentery. Findings may be related to closed loop obstruction secondary to internal hernia. Surgical consultation recommended. No pneumatosis or significant mesenteric edema. Electronically Signed   By: Amy  Guttmann M.D.   On: 05/23/2021 17:59      Assessment/Plan This is a 50-year-old male with a remote history of abdominal surgeries during childhood, presenting with a recurrent partial small bowel obstruction.  I reviewed his CT scan, which shows several dilated small bowel loops but no pneumatosis or edema and no intraperitoneal free air.  As he has never previously had any bowel resections, it is highly unlikely that he would have an internal hernia.  His abdominal exam is currently very benign with no tenderness, and he has no leukocytosis or fevers, thus I do not think that he has any threatened or ischemic bowel.  His obstructive  symptoms are again resolving, however this is his third admission in 2 months for a small bowel obstruction.  Thus I think he would benefit from surgical exploration to address his likely adhesive small bowel obstruction.  He is currently stable with a benign exam, so we will plan to do this in the morning.  I discussed that surgery will likely include a diagnostic laparoscopy with possible exploratory laparotomy and lysis of adhesions with possible small bowel resection.  He expressed understanding and agrees to proceed. - Keep NPO, IV fluid hydration - Stomach is decompressed on imaging and patient has not had any nausea or vomiting today.  Okay to defer NG tube placement for now.  Place NG if patient develops nausea or vomiting. -Tentatively plan for surgery in the morning.   Cypher Paule, MD [facility], Hepatobiliary and Pancreatic Surgery 05/23/21 11:53 PM   

## 2021-05-23 NOTE — ED Notes (Signed)
Report called to [facility], with [facility].

## 2021-05-23 NOTE — Assessment & Plan Note (Addendum)
Replaced. °

## 2021-05-23 NOTE — Consult Note (Signed)
Ricky [name] 1971/08/16  [id number].    Requesting MD: Dr. Renaye Rakers Chief Complaint/Reason for Consult: Small bowel obstruction  HPI:  Ricky [name] is a 50 yo male who presented to the ED today with concern for a recurrent small bowel obstruction.  He was recently admitted [facility] in August of this year with a small bowel obstruction, which resolved with bowel rest and nonoperative management.  He was then admitted here from 9/20-9/23 with recurrent symptoms of abdominal pain, bloating, and early satiety.  He was again treated with bowel rest and his symptoms improved with nonoperative management.  Surgery was discussed if his symptoms fail to improve.  Yesterday he again began having crampy abdominal pain with bloating and early satiety.  Yesterday he ate spaghetti and apple sauce and vomited shortly afterward.  He had a normal bowel movement 2 days ago and then no bowel movements or flatus until today, at which time he began having multiple loose watery bowel movements.  His pain has resolved and he denies any pain currently.  He has not had any nausea or vomiting today.  He says these are the same symptoms that he had during his last 2 admissions.  He has had weight loss with decreased p.o. intake since July due to the symptoms.  He presented to the ED today.  Labs were unremarkable.  A CT scan showed dilated loops of small bowel with a possible mesenteric swirl.  General surgery was consulted.  Prior abdominal surgeries include an umbilical hernia repair at age 34 and an open appendectomy at age 73.  ROS: Review of Systems  Constitutional:  Negative for chills and fever.  Respiratory:  Negative for shortness of breath, wheezing and stridor.   Cardiovascular:  Negative for chest pain.  Gastrointestinal:  Positive for abdominal pain, nausea and vomiting.  Neurological:  Negative for seizures and weakness.   Family History  Problem Relation Age of Onset   Diabetes Mother    Hypertension  Mother    Hyperlipidemia Mother    Stroke Mother    Anxiety disorder Mother    Obesity Mother    Hypertension Father    Hyperlipidemia Father     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   Eval Dr Trey Sailors, Neurosurgery 10/13   Chest pain    Depression    High cholesterol    Hypertension    Osteoarthritis    Osteoarthritis    Overweight    SOB (shortness of breath)    Thrombocytopenia (HCC) 07/07/2012   138,000 08/26/11; 98,000 06/29/12    Past Surgical History:  Procedure Laterality Date   ADENOIDECTOMY     ANTERIOR FUSION CERVICAL SPINE     APPENDECTOMY     HERNIA REPAIR     IRRIGATION AND DEBRIDEMENT SEBACEOUS CYST     on scalp   KNEE ARTHROSCOPY     x2   LUMBAR FUSION     POSTERIOR FUSION CERVICAL SPINE     TONSILLECTOMY      Social History:  reports that he quit smoking about 9 years ago. His smoking use included cigarettes. He has a 3.00 pack-year smoking history. He has never used smokeless tobacco. He reports that he does not drink alcohol and does not use drugs.  Allergies: No Known Allergies  (Not in a hospital admission)    Physical Exam: Blood pressure (!) 142/94, pulse 60, temperature 98.4  F (36.9 C), temperature source Oral, resp. rate 16, height 6\' 1"  (1.854 m), weight 124 kg, SpO2 98 %. General: resting comfortably, appears stated age, no apparent distress Neurological: alert and oriented, no focal deficits, cranial nerves grossly in tact HEENT: normocephalic, atraumatic, oropharynx clear, no scleral icterus CV: Mildly bradycardic in the 50s, extremities warm and well-perfused Respiratory: normal work of breathing, symmetric chest wall expansion Abdomen: soft, nondistended, nontender to deep palpation. No masses or organomegaly.  Well-healed umbilical and right lower quadrant surgical scars. Extremities: warm and well-perfused, no deformities, moving all extremities  spontaneously Psychiatric: normal mood and affect Skin: warm and dry, no jaundice, no rashes or lesions   Results for orders placed or performed during the hospital encounter of 05/23/21 (from the past 48 hour(s))  CBC with Differential/Platelet     Status: Abnormal   Collection Time: 05/23/21  3:41 PM  Result Value Ref Range   WBC 6.0 4.0 - 10.5 K/uL   RBC 4.95 4.22 - 5.81 MIL/uL   Hemoglobin 14.0 13.0 - 17.0 g/dL   HCT 07/23/21 35.5 - 73.2 %   MCV 89.3 80.0 - 100.0 fL   MCH 28.3 26.0 - 34.0 pg   MCHC 31.7 30.0 - 36.0 g/dL   RDW 20.2 54.2 - 70.6 %   Platelets 130 (L) 150 - 400 K/uL   nRBC 0.0 0.0 - 0.2 %   Neutrophils Relative % 49 %   Neutro Abs 2.9 1.7 - 7.7 K/uL   Lymphocytes Relative 39 %   Lymphs Abs 2.4 0.7 - 4.0 K/uL   Monocytes Relative 11 %   Monocytes Absolute 0.7 0.1 - 1.0 K/uL   Eosinophils Relative 1 %   Eosinophils Absolute 0.1 0.0 - 0.5 K/uL   Basophils Relative 0 %   Basophils Absolute 0.0 0.0 - 0.1 K/uL   WBC Morphology MORPHOLOGY UNREMARKABLE    RBC Morphology MORPHOLOGY UNREMARKABLE    Immature Granulocytes 0 %   Abs Immature Granulocytes 0.01 0.00 - 0.07 K/uL    Comment: Performed at [facility], [address] [place], [place]  Comprehensive metabolic panel     Status: Abnormal   Collection Time: 05/23/21  3:41 PM  Result Value Ref Range   Sodium 140 135 - 145 mmol/L   Potassium 3.0 (L) 3.5 - 5.1 mmol/L   Chloride 102 98 - 111 mmol/L   CO2 28 22 - 32 mmol/L   Glucose, Bld 89 70 - 99 mg/dL    Comment: Glucose reference range applies only to samples taken after fasting for at least 8 hours.   BUN 14 6 - 20 mg/dL   Creatinine, Ser 07/23/21 0.61 - 1.24 mg/dL   Calcium 8.6 (L) 8.9 - 10.3 mg/dL   Total Protein 7.1 6.5 - 8.1 g/dL   Albumin 4.2 3.5 - 5.0 g/dL   AST 23 15 - 41 U/L   ALT 23 0 - 44 U/L   Alkaline Phosphatase 70 38 - 126 U/L   Total Bilirubin 0.9 0.3 - 1.2 mg/dL   GFR, Estimated 5.17 >61 mL/min    Comment:  (NOTE) Calculated using the CKD-EPI Creatinine Equation (2021)    Anion gap 10 5 - 15    Comment: Performed at [facility], [address] [place], [place]  Lipase, blood     Status: None   Collection Time: 05/23/21  3:41 PM  Result Value Ref Range   Lipase 20 11 - 51 U/L    Comment: Performed  at [facility], [address] [place], [place] [address]  Resp Panel by RT-PCR (Flu A&B, Covid) Nasopharyngeal Swab     Status: None   Collection Time: 05/23/21  9:23 PM   Specimen: Nasopharyngeal Swab; Nasopharyngeal(NP) swabs in vial transport medium  Result Value Ref Range   SARS Coronavirus 2 by RT PCR NEGATIVE NEGATIVE    Comment: (NOTE) SARS-CoV-2 target nucleic acids are NOT DETECTED.  The SARS-CoV-2 RNA is generally detectable in upper respiratory specimens during the acute phase of infection. The lowest concentration of SARS-CoV-2 viral copies this assay can detect is 138 copies/mL. A negative result does not preclude SARS-Cov-2 infection and should not be used as the sole basis for treatment or other patient management decisions. A negative result may occur with  improper specimen collection/handling, submission of specimen other than nasopharyngeal swab, presence of viral mutation(s) within the areas targeted by this assay, and inadequate number of viral copies(<138 copies/mL). A negative result must be combined with clinical observations, patient history, and epidemiological information. The expected result is Negative.  Fact Sheet for Patients:  BloggerCourse.com  Fact Sheet for Healthcare Providers:  SeriousBroker.it  This test is no t yet approved or cleared by the [place] FDA and  has been authorized for detection and/or diagnosis of SARS-CoV-2 by FDA under an Emergency Use Authorization (EUA). This EUA will remain  in effect (meaning this test can be used) for  the duration of the COVID-19 declaration under Section 564(b)(1) of the Act, 21 U.S.C.section 360bbb-3(b)(1), unless the authorization is terminated  or revoked sooner.       Influenza A by PCR NEGATIVE NEGATIVE   Influenza B by PCR NEGATIVE NEGATIVE    Comment: (NOTE) The Xpert Xpress SARS-CoV-2/FLU/RSV plus assay is intended as an aid in the diagnosis of influenza from Nasopharyngeal swab specimens and should not be used as a sole basis for treatment. Nasal washings and aspirates are unacceptable for Xpert Xpress SARS-CoV-2/FLU/RSV testing.  Fact Sheet for Patients: BloggerCourse.com  Fact Sheet for Healthcare Providers: SeriousBroker.it  This test is not yet approved or cleared by the [place] FDA and has been authorized for detection and/or diagnosis of SARS-CoV-2 by FDA under an Emergency Use Authorization (EUA). This EUA will remain in effect (meaning this test can be used) for the duration of the COVID-19 declaration under Section 564(b)(1) of the Act, 21 U.S.C. section 360bbb-3(b)(1), unless the authorization is terminated or revoked.  Performed at [facility], [address], [place], [place] [address]    CT ABDOMEN PELVIS W CONTRAST  Result Date: 05/23/2021 CLINICAL DATA:  Bowel obstruction suspected. EXAM: CT ABDOMEN AND PELVIS WITH CONTRAST TECHNIQUE: Multidetector CT imaging of the abdomen and pelvis was performed using the standard protocol following bolus administration of intravenous contrast. CONTRAST:  OMNIPAQUE IOHEXOL 300 MG/ML  SOLN COMPARISON:  CT abdomen and pelvis 05/06/2020. FINDINGS: Lower chest: No acute abnormality. Hepatobiliary: No focal liver abnormality is seen. No gallstones, gallbladder wall thickening, or biliary dilatation. Pancreas: Unremarkable. No pancreatic ductal dilatation or surrounding inflammatory changes. Spleen: Normal in size without focal abnormality.  Adrenals/Urinary Tract: Adrenal glands are unremarkable. Kidneys are normal, without renal calculi, focal lesion, or hydronephrosis. Bladder is unremarkable. Stomach/Bowel: There are dilated small bowel loops measuring up to 5.6 cm. Transition point is visualized visualized in the central abdomen image 3/34. Transition point is also visualized in the central abdomen image 3/51. There is some swirling of central mesentery just inferior to this level. Small bowel loops  proximal and distal to this level appear decompressed. Stomach is nondilated. Colon is nondilated. The appendix is not definitely visualized. No pneumatosis or free air. No significant mesenteric edema. Vascular/Lymphatic: No significant vascular findings are present. No enlarged abdominal or pelvic lymph nodes. Reproductive: Prostate is unremarkable. Other: No abdominal wall hernia or abnormality. No abdominopelvic ascites. Musculoskeletal: L4-L5 lumbar fusion hardware is present. IMPRESSION: 1. Dilated central small bowel loops. Proximal and distal transition points are present with some swirling of the central mesentery. Findings may be related to closed loop obstruction secondary to internal hernia. Surgical consultation recommended. No pneumatosis or significant mesenteric edema. Electronically Signed   By: Darliss Cheney M.D.   On: 05/23/2021 17:59      Assessment/Plan This is a 50 year old male with a remote history of abdominal surgeries during childhood, presenting with a recurrent partial small bowel obstruction.  I reviewed his CT scan, which shows several dilated small bowel loops but no pneumatosis or edema and no intraperitoneal free air.  As he has never previously had any bowel resections, it is highly unlikely that he would have an internal hernia.  His abdominal exam is currently very benign with no tenderness, and he has no leukocytosis or fevers, thus I do not think that he has any threatened or ischemic bowel.  His obstructive  symptoms are again resolving, however this is his third admission in 2 months for a small bowel obstruction.  Thus I think he would benefit from surgical exploration to address his likely adhesive small bowel obstruction.  He is currently stable with a benign exam, so we will plan to do this in the morning.  I discussed that surgery will likely include a diagnostic laparoscopy with possible exploratory laparotomy and lysis of adhesions with possible small bowel resection.  He expressed understanding and agrees to proceed. - Keep NPO, IV fluid hydration - Stomach is decompressed on imaging and patient has not had any nausea or vomiting today.  Okay to defer NG tube placement for now.  Place NG if patient develops nausea or vomiting. -Tentatively plan for surgery in the morning.   Sophronia Simas, MD [facility] General, Hepatobiliary and Pancreatic Surgery 05/23/21 11:53 PM

## 2021-05-23 NOTE — ED Notes (Signed)
Called [facility] to transport patient to [facility] Emergency--Ricky [name] accepting

## 2021-05-23 NOTE — ED Notes (Signed)
Pt stated his heart rate is low d?t the carvedilol

## 2021-05-23 NOTE — ED Provider Notes (Signed)
[facility] EMERGENCY DEPT Provider Note   CSN: [id number] Arrival date & time: 05/23/21  1434     History Chief Complaint  Patient presents with   Abdominal Pain    Ricky [name] is a 50 y.o. male.  Patient is a 50 year old male with a history of AAA that is stable, frequent PVCs on Coreg, hypertension, recurrent small bowel obstruction recently admitted on 05/06/2021 to 05/10/2021 who is returning today for recurrent abdominal pain, vomiting and decreased bowel movements.  Patient reports 3 days after discharge from the hospital he has had intermittent episodes of abdominal cramping and discomfort.  However over the last 2 to 3 days he has had significant decrease in his bowel movements and for 2 days he did not have a bowel movement.  He describes as as cramping aching pain that he will get in his abdomen right above his umbilicus that comes and goes.  Does seem to be worse with eating.  In the last 1 to 2 weeks he has had to decrease the amount of food he is eating because he has early satiety.  Last night he tried to eat a small portion of spaghetti into applesauce and vomited x2.  He then had a very watery bowel movement with improvement in his discomfort.  Today he has had 2 other watery bowel movements but has not eaten anything.  He is still getting the intermittent crampy achy pain that does not radiate.  He has not had any fever, cough, shortness of breath.  He has no urinary symptoms.  The history is provided by the patient.  Abdominal Pain     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   Eval Dr Trey Sailors, Neurosurgery 10/13   Chest pain    Depression    High cholesterol    Hypertension    Osteoarthritis    Osteoarthritis    Overweight    SOB (shortness of breath)    Thrombocytopenia (HCC) 07/07/2012   138,000 08/26/11; 98,000 06/29/12    Patient Active Problem List   Diagnosis Date Noted   SBO  (small bowel obstruction) (HCC) 05/06/2021   OSA (obstructive sleep apnea) 10/18/2019   PVC's (premature ventricular contractions) 08/30/2019   Hypertension 08/18/2019   Thrombocytopenia (HCC) 07/07/2012   Cervical disc herniation 07/07/2012    Past Surgical History:  Procedure Laterality Date   ADENOIDECTOMY     ANTERIOR FUSION CERVICAL SPINE     APPENDECTOMY     HERNIA REPAIR     IRRIGATION AND DEBRIDEMENT SEBACEOUS CYST     on scalp   KNEE ARTHROSCOPY     x2   LUMBAR FUSION     POSTERIOR FUSION CERVICAL SPINE     TONSILLECTOMY         Family History  Problem Relation Age of Onset   Diabetes Mother    Hypertension Mother    Hyperlipidemia Mother    Stroke Mother    Anxiety disorder Mother    Obesity Mother    Hypertension Father    Hyperlipidemia Father     Social History   Tobacco Use   Smoking status: Former    Packs/day: 0.50    Years: 6.00    Pack years: 3.00    Types: Cigarettes    Quit date: 2013    Years since quitting: 9.7   Smokeless tobacco: Never  Substance Use Topics   Alcohol use:  No   Drug use: No    Home Medications Prior to Admission medications   Medication Sig Start Date End Date Taking? Authorizing Provider  Azilsartan-Chlorthalidone 40-25 MG TABS Take 1 tablet by mouth daily. 11/01/20   Little Ishikawa, MD  Blood Pressure Monitoring (BLOOD PRESSURE CUFF) MISC XL Adult blood pressure cuff 08/18/19   Little Ishikawa, MD  ibuprofen (ADVIL) 200 MG tablet Take 400 mg by mouth every 6 (six) hours as needed for fever, headache or mild pain.    [provider]  Insulin Pen Needle 32G X 4 MM MISC Use to inject Ozempic once weekly 09/25/20   Little Ishikawa, MD  potassium chloride SA (KLOR-CON) 20 MEQ tablet Take 2 tablets (40 mEq total) by mouth daily. 05/10/21 05/05/22  Swayze, Ava, DO  rosuvastatin (CRESTOR) 10 MG tablet TAKE 1 TABLET(10 MG) BY MOUTH DAILY Patient taking differently: Take 10 mg by mouth daily.  02/01/21   Little Ishikawa, MD  sildenafil (VIAGRA) 100 MG tablet TAKE 1/2 TO 1 TABLET BY MOUTH AS NEEDED. Defer further refills to PCP. Patient taking differently: Take 100 mg by mouth daily as needed for erectile dysfunction. 04/04/21   Little Ishikawa, MD    Allergies    Patient has no known allergies.  Review of Systems   Review of Systems  Gastrointestinal:  Positive for abdominal pain.  All other systems reviewed and are negative.  Physical Exam Updated Vital Signs BP 122/79   Pulse (!) 48   Temp 98.1 F (36.7 C)   Resp 15   Ht 6' (1.829 m)   Wt 124 kg   SpO2 100%   BMI 37.08 kg/m   Physical Exam Vitals and nursing note reviewed.  Constitutional:      General: He is not in acute distress.    Appearance: He is well-developed.  HENT:     Head: Normocephalic and atraumatic.  Eyes:     Conjunctiva/sclera: Conjunctivae normal.     Pupils: Pupils are equal, round, and reactive to light.  Cardiovascular:     Rate and Rhythm: Regular rhythm. Bradycardia present.     Heart sounds: No murmur heard. Pulmonary:     Effort: Pulmonary effort is normal. No respiratory distress.     Breath sounds: Normal breath sounds. No wheezing or rales.  Abdominal:     General: Bowel sounds are decreased. There is no distension.     Palpations: Abdomen is soft.     Tenderness: There is abdominal tenderness in the periumbilical area. There is no guarding or rebound.     Hernia: No hernia is present.  Musculoskeletal:        General: No tenderness. Normal range of motion.     Cervical back: Normal range of motion and neck supple.  Skin:    General: Skin is warm and dry.     Findings: No erythema or rash.  Neurological:     Mental Status: He is alert and oriented to person, place, and time.  Psychiatric:        Behavior: Behavior normal.    ED Results / Procedures / Treatments   Labs (all labs ordered are listed, but only abnormal results are displayed) Labs Reviewed   CBC WITH DIFFERENTIAL/PLATELET - Abnormal; Notable for the following components:      Result Value   Platelets 130 (*)    All other components within normal limits  COMPREHENSIVE METABOLIC PANEL - Abnormal; Notable for the following components:  Potassium 3.0 (*)    Calcium 8.6 (*)    All other components within normal limits  LIPASE, BLOOD    EKG None  Radiology CT ABDOMEN PELVIS W CONTRAST  Result Date: 05/23/2021 CLINICAL DATA:  Bowel obstruction suspected. EXAM: CT ABDOMEN AND PELVIS WITH CONTRAST TECHNIQUE: Multidetector CT imaging of the abdomen and pelvis was performed using the standard protocol following bolus administration of intravenous contrast. CONTRAST:  OMNIPAQUE IOHEXOL 300 MG/ML  SOLN COMPARISON:  CT abdomen and pelvis 05/06/2020. FINDINGS: Lower chest: No acute abnormality. Hepatobiliary: No focal liver abnormality is seen. No gallstones, gallbladder wall thickening, or biliary dilatation. Pancreas: Unremarkable. No pancreatic ductal dilatation or surrounding inflammatory changes. Spleen: Normal in size without focal abnormality. Adrenals/Urinary Tract: Adrenal glands are unremarkable. Kidneys are normal, without renal calculi, focal lesion, or hydronephrosis. Bladder is unremarkable. Stomach/Bowel: There are dilated small bowel loops measuring up to 5.6 cm. Transition point is visualized visualized in the central abdomen image 3/34. Transition point is also visualized in the central abdomen image 3/51. There is some swirling of central mesentery just inferior to this level. Small bowel loops proximal and distal to this level appear decompressed. Stomach is nondilated. Colon is nondilated. The appendix is not definitely visualized. No pneumatosis or free air. No significant mesenteric edema. Vascular/Lymphatic: No significant vascular findings are present. No enlarged abdominal or pelvic lymph nodes. Reproductive: Prostate is unremarkable. Other: No abdominal wall hernia  or abnormality. No abdominopelvic ascites. Musculoskeletal: L4-L5 lumbar fusion hardware is present. IMPRESSION: 1. Dilated central small bowel loops. Proximal and distal transition points are present with some swirling of the central mesentery. Findings may be related to closed loop obstruction secondary to internal hernia. Surgical consultation recommended. No pneumatosis or significant mesenteric edema. Electronically Signed   By: Darliss Cheney M.D.   On: 05/23/2021 17:59    Procedures Procedures   Medications Ordered in ED Medications  lactated ringers bolus 1,000 mL (0 mLs Intravenous Stopped 05/23/21 1658)  potassium chloride 10 mEq in 100 mL IVPB (0 mEq Intravenous Stopped 05/23/21 1817)  iohexol (OMNIPAQUE) 300 MG/ML solution 100 mL (100 mLs Intravenous Contrast Given 05/23/21 1648)    ED Course  I have reviewed the triage vital signs and the nursing notes.  Pertinent labs & imaging results that were available during my care of the patient were reviewed by me and considered in my medical decision making (see chart for details).    MDM Rules/Calculators/A&P                           Patient is a pleasant 50 year old male presenting today with abdominal pain, occasional vomiting and change in bowel movements in the setting of recent small bowel obstruction that resolved with supportive care.  On exam patient has some minimal periumbilical pain but no severe pain.  He does have decreased bowel sounds.  Otherwise well-appearing.  Concern for recurrent bowel obstruction versus ileus.  Also concern for possible electrolyte abnormality as he is getting intermittent cramps and has had decreased oral intake in the last 1 to 2 weeks.  He denies any fever or other acute infectious symptoms.  Labs and imaging are pending.  6:33 PM Patient's labs are within normal limits, CT does show evidence of dilated central small bowel loops proximal and distal transition point are present with some swirling of  the central mesentery which could be related to a closed-loop obstruction secondary to an internal hernia.  Spoke with  Dr. Freida Busman with general surgery who recommended an ED to ED transfer for further evaluation.  MDM   Amount and/or Complexity of Data Reviewed Clinical lab tests: ordered and reviewed Tests in the radiology section of CPT: ordered and reviewed Tests in the medicine section of CPT: ordered and reviewed Independent visualization of images, tracings, or specimens: yes    Final Clinical Impression(s) / ED Diagnoses Final diagnoses:  Small bowel obstruction [facility])    Rx / DC Orders ED Discharge Orders     None        Gwyneth Sprout, MD 05/23/21 1836

## 2021-05-23 NOTE — ED Notes (Signed)
Patient ambulated to restroom for BM.

## 2021-05-23 NOTE — Assessment & Plan Note (Addendum)
Patient presenting with several days of abdominal pain nausea and vomiting CT imaging revealing evidence of dilated central bowel loops with proximal and distal transition points with some swirling of the central mesentery concerning for closed-loop obstruction secondary to internal hernia General surgery Patient treated with laparoscopic resection of small bowel. Diet advanced and pail well controlled. Had a BM.

## 2021-05-23 NOTE — ED Provider Notes (Signed)
[name] EMERGENCY DEPARTMENT Provider Note   CSN: [id number] Arrival date & time: 05/23/21  1434     History Chief Complaint  Patient presents with   Abdominal Pain    R[facility] [name] is a 50 y.o. male presenting as a transfer from [facility] emergency department with concern for bowel obstruction on CT imaging.  The patient has been admitted in the hospital for prior episodes of bowel obstructions.  He reports he began having abdominal pain again the past 2 days, initially with vomiting, but now feels significantly improved.  At the other emergency department he had a CT of the abdomen concerning for possible internal hernia with some mesenteric swirling sign, transferred to our emergency department for general surgery evaluation.  On my assessment the patient ports he has no abdominal pain.  He reports he has had several bowel movements today.  He is passing gas.  His movements are liquidy.  He denies any belching or nausea or vomiting.  He says he otherwise feels pretty well today.  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   Eval Dr Trey Sailors, Neurosurgery 10/13   Chest pain    Depression    High cholesterol    Hypertension    Osteoarthritis    Osteoarthritis    Overweight    SOB (shortness of breath)    Thrombocytopenia (HCC) 07/07/2012   138,000 08/26/11; 98,000 06/29/12    Patient Active Problem List   Diagnosis Date Noted   Small bowel obstruction (HCC) 05/23/2021   Mixed hyperlipidemia 05/23/2021   Hypokalemia due to excessive gastrointestinal loss of potassium 05/23/2021   SBO (small bowel obstruction) (HCC) 05/06/2021   OSA (obstructive sleep apnea) 10/18/2019   PVC's (premature ventricular contractions) 08/30/2019   Essential hypertension 08/18/2019   Thrombocytopenia (HCC) 07/07/2012   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50    Years: 6.00    Pack years: 3.00    Types: Cigarettes    Quit date: 2013    Years since quitting: 9.7   Smokeless tobacco: Never  Substance Use Topics   Alcohol use: No   Drug use: No    Home Medications Prior to Admission medications   Medication Sig Start Date End Date Taking? Authorizing Provider  Azilsartan-Chlorthalidone 40-25 MG TABS Take 1 tablet by mouth daily. 11/01/20  Yes Little Ishikawa, MD  carvedilol (COREG) 25 MG tablet Take 25 mg by mouth 2 (two) times daily with a meal.   Yes [provider]  ibuprofen (ADVIL) 200 MG  tablet Take 400 mg by mouth every 6 (six) hours as needed for fever, headache or mild pain.   Yes [provider]  potassium chloride SA (KLOR-CON) 20 MEQ tablet Take 2 tablets (40 mEq total) by mouth daily. Patient taking differently: Take 20 mEq by mouth daily. 05/10/21 05/05/22 Yes Swayze, Ava, DO  rosuvastatin (CRESTOR) 10 MG tablet TAKE 1 TABLET(10 MG) BY MOUTH DAILY Patient taking differently: Take 10 mg by mouth daily. 02/01/21  Yes Little Ishikawa, MD  sildenafil (VIAGRA) 100 MG tablet TAKE 1/2 TO 1 TABLET BY MOUTH AS NEEDED. Defer further refills to PCP. Patient taking differently: Take 100 mg by mouth daily as needed for erectile dysfunction. 04/04/21  Yes Little Ishikawa, MD  Blood Pressure Monitoring (BLOOD PRESSURE CUFF) MISC XL Adult  blood pressure cuff 08/18/19   Little Ishikawa, MD  Insulin Pen Needle 32G X 4 MM MISC Use to inject Ozempic once weekly 09/25/20   Little Ishikawa, MD    Allergies    Patient has no known allergies.  Review of Systems   Review of Systems  Constitutional:  Negative for chills and fever.  HENT:  Negative for ear pain and sore throat.   Eyes:  Negative for pain and visual disturbance.  Respiratory:  Negative for cough and shortness of breath.   Cardiovascular:  Negative for chest pain and palpitations.  Gastrointestinal:  Positive for abdominal pain, diarrhea, nausea and vomiting.  Genitourinary:  Negative for dysuria and hematuria.  Musculoskeletal:  Negative for arthralgias and back pain.  Skin:  Negative for color change and rash.  Neurological:  Negative for syncope and headaches.  All other systems reviewed and are negative.  Physical Exam Updated Vital Signs BP (!) 146/99   Pulse (!) 58   Temp 98.5 F (36.9 C) (Oral)   Resp 16   Ht 6\' 1"  (1.854 m)   Wt 124 kg   SpO2 98%   BMI 36.07 kg/m   Physical Exam Constitutional:      General: He is not in acute distress. HENT:     Head: Normocephalic and atraumatic.  Eyes:     Conjunctiva/sclera: Conjunctivae normal.     Pupils: Pupils are equal, round, and reactive to light.  Cardiovascular:     Rate and Rhythm: Normal rate and regular rhythm.  Pulmonary:     Effort: Pulmonary effort is normal. No respiratory distress.  Abdominal:     General: There is no distension.     Tenderness: There is no abdominal tenderness.  Skin:    General: Skin is warm and dry.  Neurological:     General: No focal deficit present.     Mental Status: He is alert and oriented to person, place, and time. Mental status is at baseline.  Psychiatric:        Mood and Affect: Mood normal.        Behavior: Behavior normal.    ED Results / Procedures / Treatments   Labs (all labs ordered are listed, but only abnormal results are  displayed) Labs Reviewed  CBC WITH DIFFERENTIAL/PLATELET - Abnormal; Notable for the following components:      Result Value   Platelets 130 (*)    All other components within normal limits  COMPREHENSIVE METABOLIC PANEL - Abnormal; Notable for the following components:   Potassium 3.0 (*)    Calcium 8.6 (*)    All other components within normal limits  RESP PANEL BY RT-PCR (FLU A&B, COVID) ARPGX2  LIPASE, BLOOD  MAGNESIUM  BASIC METABOLIC PANEL    EKG EKG Interpretation  Date/Time:  Thursday May 23 2021 22:08:27 EDT Ventricular Rate:  51 PR Interval:  155 QRS Duration: 113 QT Interval:  427 QTC Calculation: 394 R Axis:   36 Text Interpretation: Sinus rhythm Borderline intraventricular conduction delay Abnormal R-wave progression, early transition Confirmed by Alvester Chou [phone]) on 05/23/2021 10:59:20 PM  Radiology CT ABDOMEN PELVIS W CONTRAST  Result Date: 05/23/2021 CLINICAL DATA:  Bowel obstruction suspected. EXAM: CT ABDOMEN AND PELVIS WITH CONTRAST TECHNIQUE: Multidetector CT imaging of the abdomen and pelvis was performed using the standard protocol following bolus administration of intravenous contrast. CONTRAST:  OMNIPAQUE IOHEXOL 300 MG/ML  SOLN COMPARISON:  CT abdomen and pelvis 05/06/2020. FINDINGS: Lower chest: No acute abnormality. Hepatobiliary: No focal liver abnormality is seen. No gallstones, gallbladder wall thickening, or biliary dilatation. Pancreas: Unremarkable. No pancreatic ductal dilatation or surrounding inflammatory changes. Spleen: Normal in size without focal abnormality. Adrenals/Urinary Tract: Adrenal glands are unremarkable. Kidneys are normal, without renal calculi, focal lesion, or hydronephrosis. Bladder is unremarkable. Stomach/Bowel: There are dilated small bowel loops measuring up to 5.6 cm. Transition point is visualized visualized in the central abdomen image 3/34. Transition point is also visualized in the central abdomen image  3/51. There is some swirling of central mesentery just inferior to this level. Small bowel loops proximal and distal to this level appear decompressed. Stomach is nondilated. Colon is nondilated. The appendix is not definitely visualized. No pneumatosis or free air. No significant mesenteric edema. Vascular/Lymphatic: No significant vascular findings are present. No enlarged abdominal or pelvic lymph nodes. Reproductive: Prostate is unremarkable. Other: No abdominal wall hernia or abnormality. No abdominopelvic ascites. Musculoskeletal: L4-L5 lumbar fusion hardware is present. IMPRESSION: 1. Dilated central small bowel loops. Proximal and distal transition points are present with some swirling of the central mesentery. Findings may be related to closed loop obstruction secondary to internal hernia. Surgical consultation recommended. No pneumatosis or significant mesenteric edema. Electronically Signed   By: Darliss Cheney M.D.   On: 05/23/2021 17:59    Procedures Procedures   Medications Ordered in ED Medications  hydrALAZINE (APRESOLINE) injection 10 mg ( Intravenous MAR Hold 05/24/21 0939)  lactated ringers 1,000 mL with potassium chloride 40 mEq infusion ( Intravenous Rate/Dose Change 05/24/21 0048)  ceFAZolin (ANCEF) IVPB 2g/100 mL premix ( Intravenous MAR Hold 05/24/21 0939)  lactated ringers infusion (has no administration in time range)  lactated ringers bolus 1,000 mL (0 mLs Intravenous Stopped 05/23/21 1658)  potassium chloride 10 mEq in 100 mL IVPB (0 mEq Intravenous Stopped 05/23/21 1817)  iohexol (OMNIPAQUE) 300 MG/ML solution 100 mL (100 mLs Intravenous Contrast Given 05/23/21 1648)  chlorhexidine (PERIDEX) 0.12 % solution 15 mL (15 mLs Mouth/Throat Given 05/24/21 0944)    Or  MEDLINE mouth rinse ( Mouth Rinse See Alternative 05/24/21 0944)    ED Course  I have reviewed the triage vital signs and the nursing notes.  Pertinent labs & imaging results that were available during my care of the  patient were reviewed by me and considered in my medical decision making (see chart for details).  Presenting with concern for partial bowel obstruction, possible hernia.  He is well-appearing on exam with a benign abdominal exam, doubt he has an incarcerated hernia or mesenteric strangulation or ischemia.  However given that this is a recurring issue, I have discussed the case with general surgeon who recommends hospitalization.  It is not clear whether he would need  surgical intervention at this time, but we will keep him n.p.o. at midnight.  Otherwise we will continue to replete his potassium here.  Patient is not requiring pain medications at this time.  I personally reviewed and interpreted his labs as noted above, normal white blood cell count.  We will add on a magnesium level.  K is 3.0.  I also reviewed his prior medical history  Clinical Course as of 05/24/21 1114  Thu May 23, 2021  2256 Pg general surgery [MT]  2308 Dr Freida Busman to see pt, requesting hospitalist admission - paged for admission.  Pt remains pain free [MT]  2330 Admitted to hospitalist [MT]    Clinical Course User Index [MT] Brettany Sydney, Kermit Balo, MD   Final Clinical Impression(s) / ED Diagnoses Final diagnoses:  Small bowel obstruction (HCC)  Hypokalemia    Rx / DC Orders ED Discharge Orders     None        Terald Sleeper, MD 05/24/21 1114

## 2021-05-23 NOTE — Assessment & Plan Note (Addendum)
.   Continuing home regimen of lipid lowering therapy.  

## 2021-05-24 ENCOUNTER — Encounter (HOSPITAL_COMMUNITY): Admission: EM | Disposition: A | Discharge status: Home / Self Care | Payer: Self-pay | Source: Home / Self Care

## 2021-05-24 ENCOUNTER — Encounter (HOSPITAL_COMMUNITY): Payer: Self-pay | Admitting: Internal Medicine

## 2021-05-24 ENCOUNTER — Inpatient Hospital Stay (HOSPITAL_COMMUNITY): Payer: Medicare Other | Admitting: Certified Registered Nurse Anesthetist

## 2021-05-24 DIAGNOSIS — K56609 Unspecified intestinal obstruction, unspecified as to partial versus complete obstruction: Secondary | ICD-10-CM

## 2021-05-24 DIAGNOSIS — E782 Mixed hyperlipidemia: Secondary | ICD-10-CM

## 2021-05-24 DIAGNOSIS — I10 Essential (primary) hypertension: Secondary | ICD-10-CM

## 2021-05-24 DIAGNOSIS — E876 Hypokalemia: Secondary | ICD-10-CM

## 2021-05-24 HISTORY — PX: LAPAROTOMY: SHX154

## 2021-05-24 HISTORY — PX: LAPAROSCOPY: SHX197

## 2021-05-24 HISTORY — PX: BOWEL RESECTION: SHX1257

## 2021-05-24 LAB — BASIC METABOLIC PANEL
Anion gap: 9 (ref 5–15)
BUN: 8 mg/dL (ref 6–20)
CO2: 26 mmol/L (ref 22–32)
Calcium: 8.4 mg/dL — ABNORMAL LOW (ref 8.9–10.3)
Chloride: 103 mmol/L (ref 98–111)
Creatinine, Ser: 0.82 mg/dL (ref 0.61–1.24)
GFR, Estimated: >60 mL/min (ref >60)
Glucose, Bld: 105 mg/dL — ABNORMAL HIGH (ref 70–99)
Potassium: 3.2 mmol/L — ABNORMAL LOW (ref 3.5–5.1)
Sodium: 138 mmol/L (ref 135–145)

## 2021-05-24 LAB — MAGNESIUM: Magnesium: 1.8 mg/dL (ref 1.7–2.4)

## 2021-05-24 LAB — TYPE AND SCREEN
ABO/RH(D): B POS
Antibody Screen: NEGATIVE

## 2021-05-24 SURGERY — LAPAROSCOPY, DIAGNOSTIC
Anesthesia: General | Site: Abdomen

## 2021-05-24 MED ORDER — OXYCODONE HCL 5 MG PO TABS
5.0000 mg | ORAL_TABLET | Freq: Four times a day (QID) | ORAL | Status: DC | PRN
  Administered 2021-05-24: 5 mg via ORAL
  Filled 2021-05-24 (×2): qty 1

## 2021-05-24 MED ORDER — SUCCINYLCHOLINE CHLORIDE 200 MG/10ML IV SOSY
PREFILLED_SYRINGE | INTRAVENOUS | Status: AC
  Filled 2021-05-24: qty 10

## 2021-05-24 MED ORDER — MELATONIN 5 MG PO TABS
10.0000 mg | ORAL_TABLET | Freq: Every day | ORAL | Status: DC
  Administered 2021-05-24 – 2021-05-26 (×3): 10 mg via ORAL
  Filled 2021-05-24 (×3): qty 2

## 2021-05-24 MED ORDER — SUCCINYLCHOLINE CHLORIDE 200 MG/10ML IV SOSY
PREFILLED_SYRINGE | INTRAVENOUS | Status: DC | PRN
  Administered 2021-05-24: 120 mg via INTRAVENOUS

## 2021-05-24 MED ORDER — ONDANSETRON 4 MG PO TBDP: 4.0000 mg | ORAL_TABLET | Freq: Four times a day (QID) | ORAL | Status: DC | PRN

## 2021-05-24 MED ORDER — 0.9 % SODIUM CHLORIDE (POUR BTL) OPTIME
TOPICAL | Status: DC | PRN
  Administered 2021-05-24 (×2): 1000 mL

## 2021-05-24 MED ORDER — ONDANSETRON HCL 4 MG/2ML IJ SOLN
4.0000 mg | Freq: Four times a day (QID) | INTRAMUSCULAR | Status: DC | PRN
  Administered 2021-05-24: 4 mg via INTRAVENOUS
  Filled 2021-05-24: qty 2

## 2021-05-24 MED ORDER — CHLORHEXIDINE GLUCONATE 0.12 % MT SOLN: 15.0000 mL | Freq: Once | OROMUCOSAL | Status: AC

## 2021-05-24 MED ORDER — SUGAMMADEX SODIUM 200 MG/2ML IV SOLN
INTRAVENOUS | Status: DC | PRN
Start: 2021-05-24 — End: 2021-05-24
  Administered 2021-05-24: 200 mg via INTRAVENOUS
  Administered 2021-05-24: 300 mg via INTRAVENOUS

## 2021-05-24 MED ORDER — HYDROMORPHONE HCL 1 MG/ML IJ SOLN
0.2500 mg | INTRAMUSCULAR | Status: DC | PRN
  Administered 2021-05-24 (×2): 0.5 mg via INTRAVENOUS

## 2021-05-24 MED ORDER — LIDOCAINE 2% (20 MG/ML) 5 ML SYRINGE
INTRAMUSCULAR | Status: AC
  Filled 2021-05-24: qty 5

## 2021-05-24 MED ORDER — FENTANYL CITRATE (PF) 250 MCG/5ML IJ SOLN
INTRAMUSCULAR | Status: DC | PRN
  Administered 2021-05-24 (×3): 50 ug via INTRAVENOUS

## 2021-05-24 MED ORDER — ROCURONIUM BROMIDE 10 MG/ML (PF) SYRINGE
PREFILLED_SYRINGE | INTRAVENOUS | Status: AC
  Filled 2021-05-24: qty 10

## 2021-05-24 MED ORDER — BUPIVACAINE-EPINEPHRINE (PF) 0.25% -1:200000 IJ SOLN
INTRAMUSCULAR | Status: AC
  Filled 2021-05-24: qty 30

## 2021-05-24 MED ORDER — ENOXAPARIN SODIUM 40 MG/0.4ML IJ SOSY
40.0000 mg | PREFILLED_SYRINGE | INTRAMUSCULAR | Status: DC
  Administered 2021-05-25 – 2021-05-27 (×3): 40 mg via SUBCUTANEOUS
  Filled 2021-05-24 (×3): qty 0.4

## 2021-05-24 MED ORDER — HYDROMORPHONE HCL 1 MG/ML IJ SOLN
INTRAMUSCULAR | Status: AC
  Filled 2021-05-24: qty 1

## 2021-05-24 MED ORDER — ROCURONIUM BROMIDE 10 MG/ML (PF) SYRINGE
PREFILLED_SYRINGE | INTRAVENOUS | Status: DC | PRN
  Administered 2021-05-24: 70 mg via INTRAVENOUS

## 2021-05-24 MED ORDER — HYDROMORPHONE HCL 1 MG/ML IJ SOLN: 0.2500 mg | INTRAMUSCULAR | Status: DC | PRN

## 2021-05-24 MED ORDER — EPHEDRINE SULFATE-NACL 50-0.9 MG/10ML-% IV SOSY
PREFILLED_SYRINGE | INTRAVENOUS | Status: DC | PRN
  Administered 2021-05-24: 5 mg via INTRAVENOUS

## 2021-05-24 MED ORDER — PHENYLEPHRINE HCL-NACL 20-0.9 MG/250ML-% IV SOLN
INTRAVENOUS | Status: DC | PRN
  Administered 2021-05-24: 30 ug/min via INTRAVENOUS

## 2021-05-24 MED ORDER — FENTANYL CITRATE PF 50 MCG/ML IJ SOSY
12.5000 ug | PREFILLED_SYRINGE | INTRAMUSCULAR | Status: DC | PRN
  Filled 2021-05-24: qty 1

## 2021-05-24 MED ORDER — LACTATED RINGERS IV SOLN: INTRAVENOUS | Status: DC

## 2021-05-24 MED ORDER — DEXMEDETOMIDINE (PRECEDEX) IN NS 20 MCG/5ML (4 MCG/ML) IV SYRINGE
PREFILLED_SYRINGE | INTRAVENOUS | Status: DC | PRN
  Administered 2021-05-24: 8 ug via INTRAVENOUS

## 2021-05-24 MED ORDER — SUGAMMADEX SODIUM 500 MG/5ML IV SOLN
INTRAVENOUS | Status: AC
  Filled 2021-05-24: qty 5

## 2021-05-24 MED ORDER — PROPOFOL 10 MG/ML IV BOLUS
INTRAVENOUS | Status: DC | PRN
  Administered 2021-05-24: 50 mg via INTRAVENOUS
  Administered 2021-05-24: 200 mg via INTRAVENOUS

## 2021-05-24 MED ORDER — DEXAMETHASONE SODIUM PHOSPHATE 10 MG/ML IJ SOLN
INTRAMUSCULAR | Status: AC
  Filled 2021-05-24: qty 1

## 2021-05-24 MED ORDER — FENTANYL CITRATE (PF) 250 MCG/5ML IJ SOLN
INTRAMUSCULAR | Status: AC
  Filled 2021-05-24: qty 5

## 2021-05-24 MED ORDER — POTASSIUM CHLORIDE IN NACL 20-0.9 MEQ/L-% IV SOLN
INTRAVENOUS | Status: DC
  Filled 2021-05-24 (×4): qty 1000

## 2021-05-24 MED ORDER — PANTOPRAZOLE SODIUM 40 MG IV SOLR
40.0000 mg | Freq: Every day | INTRAVENOUS | Status: DC
  Administered 2021-05-24 – 2021-05-26 (×3): 40 mg via INTRAVENOUS
  Filled 2021-05-24 (×3): qty 40

## 2021-05-24 MED ORDER — BUPIVACAINE-EPINEPHRINE (PF) 0.25% -1:200000 IJ SOLN
INTRAMUSCULAR | Status: DC | PRN
  Administered 2021-05-24: 16 mL

## 2021-05-24 MED ORDER — ONDANSETRON HCL 4 MG/2ML IJ SOLN
INTRAMUSCULAR | Status: DC | PRN
  Administered 2021-05-24: 4 mg via INTRAVENOUS

## 2021-05-24 MED ORDER — LIDOCAINE 2% (20 MG/ML) 5 ML SYRINGE
INTRAMUSCULAR | Status: DC | PRN
  Administered 2021-05-24: 100 mg via INTRAVENOUS

## 2021-05-24 MED ORDER — PROPOFOL 10 MG/ML IV BOLUS
INTRAVENOUS | Status: AC
  Filled 2021-05-24: qty 20

## 2021-05-24 MED ORDER — MELATONIN 3 MG PO TABS: 3.0000 mg | ORAL_TABLET | Freq: Every day | ORAL | Status: DC

## 2021-05-24 MED ORDER — MIDAZOLAM HCL 2 MG/2ML IJ SOLN
INTRAMUSCULAR | Status: DC | PRN
  Administered 2021-05-24: 2 mg via INTRAVENOUS

## 2021-05-24 MED ORDER — DEXAMETHASONE SODIUM PHOSPHATE 10 MG/ML IJ SOLN
INTRAMUSCULAR | Status: DC | PRN
  Administered 2021-05-24: 5 mg via INTRAVENOUS

## 2021-05-24 MED ORDER — CARVEDILOL 25 MG PO TABS
25.0000 mg | ORAL_TABLET | Freq: Two times a day (BID) | ORAL | Status: DC
  Administered 2021-05-24 – 2021-05-27 (×6): 25 mg via ORAL
  Filled 2021-05-24 (×6): qty 1

## 2021-05-24 MED ORDER — MIDAZOLAM HCL 2 MG/2ML IJ SOLN
INTRAMUSCULAR | Status: AC
  Filled 2021-05-24: qty 2

## 2021-05-24 MED ORDER — CEFAZOLIN SODIUM-DEXTROSE 2-4 GM/100ML-% IV SOLN
2.0000 g | Freq: Once | INTRAVENOUS | Status: AC
  Administered 2021-05-24: 2 g via INTRAVENOUS
  Filled 2021-05-24: qty 100

## 2021-05-24 MED ORDER — EPHEDRINE 5 MG/ML INJ
INTRAVENOUS | Status: AC
  Filled 2021-05-24: qty 5

## 2021-05-24 MED ORDER — CYCLOBENZAPRINE HCL 10 MG PO TABS
10.0000 mg | ORAL_TABLET | Freq: Three times a day (TID) | ORAL | Status: DC
  Administered 2021-05-24 – 2021-05-27 (×8): 10 mg via ORAL
  Filled 2021-05-24 (×8): qty 1

## 2021-05-24 MED ORDER — ONDANSETRON HCL 4 MG/2ML IJ SOLN
INTRAMUSCULAR | Status: AC
  Filled 2021-05-24: qty 2

## 2021-05-24 MED ORDER — ORAL CARE MOUTH RINSE: 15.0000 mL | Freq: Once | OROMUCOSAL | Status: AC

## 2021-05-24 MED ORDER — CHLORHEXIDINE GLUCONATE 0.12 % MT SOLN
OROMUCOSAL | Status: AC
  Administered 2021-05-24: 15 mL via OROMUCOSAL
  Filled 2021-05-24: qty 15

## 2021-05-24 SURGICAL SUPPLY — 62 items
ADH SKN CLS APL DERMABOND .7 (GAUZE/BANDAGES/DRESSINGS) ×1
APL PRP STRL LF DISP 70% ISPRP (MISCELLANEOUS) ×1
BAG COUNTER SPONGE SURGICOUNT (BAG) ×2 IMPLANT
BAG SPNG CNTER NS LX DISP (BAG) ×1
BLADE CLIPPER SURG (BLADE) IMPLANT
CANISTER SUCT 3000ML PPV (MISCELLANEOUS) ×2 IMPLANT
CHLORAPREP W/TINT 26 (MISCELLANEOUS) ×2 IMPLANT
COVER SURGICAL LIGHT HANDLE (MISCELLANEOUS) ×2 IMPLANT
DECANTER SPIKE VIAL GLASS SM (MISCELLANEOUS) ×4 IMPLANT
DERMABOND ADVANCED (GAUZE/BANDAGES/DRESSINGS) ×1
DERMABOND ADVANCED .7 DNX12 (GAUZE/BANDAGES/DRESSINGS) ×1 IMPLANT
DRAPE LAPAROSCOPIC ABDOMINAL (DRAPES) ×2 IMPLANT
DRAPE WARM FLUID 44X44 (DRAPES) ×2 IMPLANT
DRSG OPSITE POSTOP 4X10 (GAUZE/BANDAGES/DRESSINGS) IMPLANT
DRSG OPSITE POSTOP 4X8 (GAUZE/BANDAGES/DRESSINGS) IMPLANT
ELECT BLADE 6.5 EXT (BLADE) IMPLANT
ELECT CAUTERY BLADE 6.4 (BLADE) ×2 IMPLANT
ELECT REM PT RETURN 9FT ADLT (ELECTROSURGICAL) ×2
ELECTRODE REM PT RTRN 9FT ADLT (ELECTROSURGICAL) ×1 IMPLANT
GLOVE SURG ENC MOIS LTX SZ7.5 (GLOVE) ×2 IMPLANT
GOWN STRL REUS W/ TWL LRG LVL3 (GOWN DISPOSABLE) ×3 IMPLANT
GOWN STRL REUS W/TWL LRG LVL3 (GOWN DISPOSABLE) ×6
HANDLE SUCTION POOLE (INSTRUMENTS) ×1 IMPLANT
KIT BASIN OR (CUSTOM PROCEDURE TRAY) ×2 IMPLANT
KIT TURNOVER KIT B (KITS) ×2 IMPLANT
LIGASURE IMPACT 36 18CM CVD LR (INSTRUMENTS) ×1 IMPLANT
NS IRRIG 1000ML POUR BTL (IV SOLUTION) ×4 IMPLANT
PACK GENERAL/GYN (CUSTOM PROCEDURE TRAY) ×2 IMPLANT
PAD ARMBOARD 7.5X6 YLW CONV (MISCELLANEOUS) ×4 IMPLANT
PENCIL SMOKE EVACUATOR (MISCELLANEOUS) ×2 IMPLANT
RELOAD PROXIMATE 75MM BLUE (ENDOMECHANICALS) ×2 IMPLANT
RELOAD STAPLE 75 3.8 BLU REG (ENDOMECHANICALS) IMPLANT
RELOAD STAPLER LINE PROX 60 GR (STAPLE) ×1 IMPLANT
RTRCTR WOUND ALEXIS 18CM SML (INSTRUMENTS) ×2
SAVER CELL AAL HAEMONETICS (INSTRUMENTS) IMPLANT
SCISSORS LAP 5X35 DISP (ENDOMECHANICALS) IMPLANT
SET IRRIG TUBING LAPAROSCOPIC (IRRIGATION / IRRIGATOR) IMPLANT
SET TUBE SMOKE EVAC HIGH FLOW (TUBING) ×2 IMPLANT
SHEARS HARMONIC ACE PLUS 36CM (ENDOMECHANICALS) IMPLANT
SLEEVE ENDOPATH XCEL 5M (ENDOMECHANICALS) ×2 IMPLANT
SPECIMEN JAR LARGE (MISCELLANEOUS) IMPLANT
SPONGE T-LAP 18X18 ~~LOC~~+RFID (SPONGE) IMPLANT
STAPLER PROXIMATE 75MM BLUE (STAPLE) ×1 IMPLANT
STAPLER RELOAD LINE PROX 60 GR (STAPLE) ×2
STAPLER RELOADABLE 60 GRN THCK (STAPLE) IMPLANT
STAPLER VISISTAT 35W (STAPLE) ×2 IMPLANT
SUCTION POOLE HANDLE (INSTRUMENTS) ×2
SUT MNCRL AB 4-0 PS2 18 (SUTURE) ×2 IMPLANT
SUT PDS AB 1 TP1 96 (SUTURE) ×4 IMPLANT
SUT SILK 2 0 SH CR/8 (SUTURE) ×2 IMPLANT
SUT SILK 2 0 TIES 10X30 (SUTURE) ×2 IMPLANT
SUT SILK 3 0 SH CR/8 (SUTURE) ×2 IMPLANT
SUT SILK 3 0 TIES 10X30 (SUTURE) ×2 IMPLANT
SUT VIC AB 3-0 SH 18 (SUTURE) IMPLANT
TOWEL GREEN STERILE (TOWEL DISPOSABLE) ×2 IMPLANT
TOWEL GREEN STERILE FF (TOWEL DISPOSABLE) ×2 IMPLANT
TRAY FOLEY MTR SLVR 16FR STAT (SET/KITS/TRAYS/PACK) IMPLANT
TRAY LAPAROSCOPIC MC (CUSTOM PROCEDURE TRAY) ×2 IMPLANT
TROCAR XCEL BLUNT TIP 100MML (ENDOMECHANICALS) IMPLANT
TROCAR XCEL NON-BLD 11X100MML (ENDOMECHANICALS) IMPLANT
TROCAR XCEL NON-BLD 5MMX100MML (ENDOMECHANICALS) ×2 IMPLANT
YANKAUER SUCT BULB TIP NO VENT (SUCTIONS) IMPLANT

## 2021-05-24 NOTE — H&P (Addendum)
History and Physical    Ricky [name] QPY:[id number] DOB: 02/04/1971 DOA: 05/23/2021  PCP: Maryagnes Amos, FNP  Patient coming from: Home via [facility] ED   Chief Complaint:  Chief Complaint  Patient presents with   Abdominal Pain     HPI:    50 year old male with past medical history of hypertension, hyperlipidemia as well as 2 recent hospitalizations for small bowel obstructions, last discharged on 9/23 from [facility] who presents to [facility] emergency department as a transfer from [facility] ED for abdominal pain nausea and vomiting.  Patient explains that approximately 2 days ago he developed recurrent abdominal pain.  Patient describes his abdominal pain as cramping in quality, severe in intensity (10/10) and nonradiating.  Pain is worse with movement or attempts to ingest oral intake.  Symptoms have been associated with poor appetite and several bouts of vomiting.  Interestingly enough, patient's symptoms improved somewhat on 10/6 however considering patient's recent diagnosis of bowel obstruction requiring hospitalization the patient decided to present to [facility] for evaluation.  Upon evaluation in the emergency department CT imaging of the abdomen pelvis was performed revealing dilated central small bowel loops with proximal distal transition points with some swirling of the central mesentery possibly suggestive of closed-loop obstruction.  Patient was additionally found to be hypokalemic with potassium of 3.0.  Patient was started on intravenous fluids and ER provider discussed case with Dr. Freida Busman with general surgery who has agreed to come evaluate the patient in consultation.  For prompt bedside evaluation patient was transferred [facility] to [facility] emergency department to expedite evaluation.  Upon arrival, the hospitalist group is now been called to assess the patient for admission to the hospital.  Review of  Systems:   Review of Systems  Gastrointestinal:  Positive for abdominal pain, nausea and vomiting.  All other systems reviewed and are negative.  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   Eval Dr Trey Sailors, Neurosurgery 10/13   Chest pain    Depression    High cholesterol    Hypertension    Osteoarthritis    Osteoarthritis    Overweight    SOB (shortness of breath)    Thrombocytopenia (HCC) 07/07/2012   138,000 08/26/11; 98,000 06/29/12    Past Surgical History:  Procedure Laterality Date   ADENOIDECTOMY     ANTERIOR FUSION CERVICAL SPINE     APPENDECTOMY     HERNIA REPAIR     IRRIGATION AND DEBRIDEMENT SEBACEOUS CYST     on scalp   KNEE ARTHROSCOPY     x2   LUMBAR FUSION     POSTERIOR FUSION CERVICAL SPINE     TONSILLECTOMY       reports that he quit smoking about 9 years ago. His smoking use included cigarettes. He has a 3.00 pack-year smoking history. He has never used smokeless tobacco. He reports that he does not drink alcohol and does not use drugs.  No Known Allergies  Family History  Problem Relation Age of Onset   Diabetes Mother    Hypertension Mother    Hyperlipidemia Mother    Stroke Mother    Anxiety disorder Mother    Obesity Mother    Hypertension Father    Hyperlipidemia Father      Prior to Admission medications   Medication Sig Start Date End Date Taking? Authorizing Provider  Azilsartan-Chlorthalidone 40-25 MG TABS Take 1 tablet by mouth daily. 11/01/20   Little Ishikawa, MD  Blood Pressure Monitoring (BLOOD PRESSURE CUFF) MISC XL Adult blood pressure cuff 08/18/19   Little Ishikawa, MD  ibuprofen (ADVIL) 200 MG tablet Take 400 mg by mouth every 6 (six) hours as needed for fever, headache or mild pain.    [provider]  Insulin Pen Needle 32G X 4 MM MISC Use to inject Ozempic once weekly 09/25/20   Little Ishikawa, MD  potassium chloride SA  (KLOR-CON) 20 MEQ tablet Take 2 tablets (40 mEq total) by mouth daily. 05/10/21 05/05/22  Swayze, Ava, DO  rosuvastatin (CRESTOR) 10 MG tablet TAKE 1 TABLET(10 MG) BY MOUTH DAILY Patient taking differently: Take 10 mg by mouth daily. 02/01/21   Little Ishikawa, MD  sildenafil (VIAGRA) 100 MG tablet TAKE 1/2 TO 1 TABLET BY MOUTH AS NEEDED. Defer further refills to PCP. Patient taking differently: Take 100 mg by mouth daily as needed for erectile dysfunction. 04/04/21   Little Ishikawa, MD    Physical Exam: Vitals:   05/23/21 2220 05/23/21 2221 05/23/21 2330 05/24/21 0000  BP: (!) 142/94  125/89 (!) 132/93  Pulse: 60  (!) 51 (!) 58  Resp: 16  13 17   Temp: 98.4 F (36.9 C)     TempSrc: Oral     SpO2: 98%  99% 99%  Weight:      Height:  6\' 1"  (1.854 m)      Constitutional: Awake alert and oriented x3, no associated distress.   Skin: no rashes, no lesions, good skin turgor noted. Eyes: Pupils are equally reactive to light.  No evidence of scleral icterus or conjunctival pallor.  ENMT: Moist mucous membranes noted.  Posterior pharynx clear of any exudate or lesions.   Neck: normal, supple, no masses, no thyromegaly.  No evidence of jugular venous distension.   Respiratory: clear to auscultation bilaterally, no wheezing, no crackles. Normal respiratory effort. No accessory muscle use.  Cardiovascular: Regular rate and rhythm, no murmurs / rubs / gallops. No extremity edema. 2+ pedal pulses. No carotid bruits.  Chest:   Nontender without crepitus or deformity.   Back:   Nontender without crepitus or deformity. Abdomen: Notable periumbilical tenderness.  Abdomen is soft however.  Abdomen is soft and nontender.  No evidence of intra-abdominal masses.  Positive bowel sounds noted in all quadrants.   Musculoskeletal: No joint deformity upper and lower extremities. Good ROM, no contractures. Normal muscle tone.  Neurologic: CN 2-12 grossly intact. Sensation intact.  Patient moving all  4 extremities spontaneously.  Patient is following all commands.  Patient is responsive to verbal stimuli.   Psychiatric: Patient exhibits normal mood with appropriate affect.  Patient seems to possess insight as to their current situation.     Labs on Admission: I have personally reviewed following labs and imaging studies -   CBC: Recent Labs  Lab 05/23/21 1541  WBC 6.0  NEUTROABS 2.9  HGB 14.0  HCT 44.2  MCV 89.3  PLT 130*   Basic Metabolic Panel: Recent Labs  Lab 05/23/21 1541 05/23/21 2359  NA 140  --   K 3.0*  --   CL 102  --   CO2 28  --   GLUCOSE 89  --   BUN 14  --   CREATININE 0.80  --   CALCIUM 8.6*  --   MG  --  1.8   GFR: Estimated Creatinine Clearance: 150.7 mL/min (by  C-G formula based on SCr of 0.8 mg/dL). Liver Function Tests: Recent Labs  Lab 05/23/21 1541  AST 23  ALT 23  ALKPHOS 70  BILITOT 0.9  PROT 7.1  ALBUMIN 4.2   Recent Labs  Lab 05/23/21 1541  LIPASE 20   No results for input(s): AMMONIA in the last 168 hours. Coagulation Profile: No results for input(s): INR, PROTIME in the last 168 hours. Cardiac Enzymes: No results for input(s): CKTOTAL, CKMB, CKMBINDEX, TROPONINI in the last 168 hours. BNP (last 3 results) No results for input(s): PROBNP in the last 8760 hours. HbA1C: No results for input(s): HGBA1C in the last 72 hours. CBG: No results for input(s): GLUCAP in the last 168 hours. Lipid Profile: No results for input(s): CHOL, HDL, LDLCALC, TRIG, CHOLHDL, LDLDIRECT in the last 72 hours. Thyroid Function Tests: No results for input(s): TSH, T4TOTAL, FREET4, T3FREE, THYROIDAB in the last 72 hours. Anemia Panel: No results for input(s): VITAMINB12, FOLATE, FERRITIN, TIBC, IRON, RETICCTPCT in the last 72 hours. Urine analysis: No results found for: COLORURINE, APPEARANCEUR, LABSPEC, PHURINE, GLUCOSEU, HGBUR, BILIRUBINUR, KETONESUR, PROTEINUR, UROBILINOGEN, NITRITE, LEUKOCYTESUR  Radiological Exams on Admission - Personally  Reviewed: CT ABDOMEN PELVIS W CONTRAST  Result Date: 05/23/2021 CLINICAL DATA:  Bowel obstruction suspected. EXAM: CT ABDOMEN AND PELVIS WITH CONTRAST TECHNIQUE: Multidetector CT imaging of the abdomen and pelvis was performed using the standard protocol following bolus administration of intravenous contrast. CONTRAST:  OMNIPAQUE IOHEXOL 300 MG/ML  SOLN COMPARISON:  CT abdomen and pelvis 05/06/2020. FINDINGS: Lower chest: No acute abnormality. Hepatobiliary: No focal liver abnormality is seen. No gallstones, gallbladder wall thickening, or biliary dilatation. Pancreas: Unremarkable. No pancreatic ductal dilatation or surrounding inflammatory changes. Spleen: Normal in size without focal abnormality. Adrenals/Urinary Tract: Adrenal glands are unremarkable. Kidneys are normal, without renal calculi, focal lesion, or hydronephrosis. Bladder is unremarkable. Stomach/Bowel: There are dilated small bowel loops measuring up to 5.6 cm. Transition point is visualized visualized in the central abdomen image 3/34. Transition point is also visualized in the central abdomen image 3/51. There is some swirling of central mesentery just inferior to this level. Small bowel loops proximal and distal to this level appear decompressed. Stomach is nondilated. Colon is nondilated. The appendix is not definitely visualized. No pneumatosis or free air. No significant mesenteric edema. Vascular/Lymphatic: No significant vascular findings are present. No enlarged abdominal or pelvic lymph nodes. Reproductive: Prostate is unremarkable. Other: No abdominal wall hernia or abnormality. No abdominopelvic ascites. Musculoskeletal: L4-L5 lumbar fusion hardware is present. IMPRESSION: 1. Dilated central small bowel loops. Proximal and distal transition points are present with some swirling of the central mesentery. Findings may be related to closed loop obstruction secondary to internal hernia. Surgical consultation recommended. No  pneumatosis or significant mesenteric edema. Electronically Signed   By: Darliss Cheney M.D.   On: 05/23/2021 17:59    EKG: Personally reviewed.  Rhythm is sinus bradycardia with heart rate of 51 bpm.  No dynamic ST segment changes appreciated.  Assessment/Plan  * Small bowel obstruction [facility]) Patient presenting with several days of abdominal pain nausea and vomiting CT imaging revealing evidence of dilated central bowel loops with proximal and distal transition points with some swirling of the central mesentery concerning for closed-loop obstruction secondary to internal hernia ER providers already discussed case with Dr. Freida Busman with general surgery who will come to evaluate in consultation Patient currently n.p.o. Hydrating patient aggressively with intravenous isotonic fluids As needed antiemetics As needed opiate-based analgesics for associated pain NG tube placement  if patient begins to exhibit recurrent vomiting.  Dilated central small bowel loops. Proximal and distal transition points are present with some swirling of the central mesentery. Findings may be related to closed loop obstruction secondary to internal hernia. Surgical consultation recommended. No pneumatosis or significant mesenteric edema.  Hypokalemia due to excessive gastrointestinal loss of potassium Replacing with intravenous potassium Evaluating for concurrent hypomagnesemia.  Monitoring potassium levels with serial chemistries.   Essential hypertension  PRN intravenous antihypertensives for excessively elevated blood pressure     Mixed hyperlipidemia Continuing home regimen of lipid lowering therapy.        Code Status:  Full code  code status decision has been confirmed with: patient Family Communication: Deferred   Status is: Inpatient  Remains inpatient appropriate because:Ongoing active pain requiring inpatient pain management, IV treatments appropriate due to intensity of illness or  inability to take PO, and Inpatient level of care appropriate due to severity of illness  Dispo: The patient is from: Home              Anticipated d/c is to: Home              Patient currently is not medically stable to d/c.   Difficult to place patient No        Marinda Elk MD [facility] Pager [phone]  If 7PM-7AM, please contact night-coverage [email or web address] Use universal [place] password for that web site. If you do not have the password, please call the hospital operator.  05/24/2021, 12:38 AM

## 2021-05-24 NOTE — Transfer of Care (Signed)
Immediate Anesthesia Transfer of Care Note  Patient: [name]  Procedure(s) Performed: LAPAROSCOPY DIAGNOSTIC (Abdomen) EXPLORATORY LAPAROTOMY (Abdomen) SMALL BOWEL RESECTION (Abdomen)  Patient Location: PACU  Anesthesia Type:General  Level of Consciousness: awake, alert  and oriented  Airway & Oxygen Therapy: Patient Spontanous Breathing  Post-op Assessment: Report given to RN and Post -op Vital signs reviewed and stable  Post vital signs: Reviewed and stable  Last Vitals:  Vitals Value Taken Time  BP 130/84 05/24/21 1254  Temp    Pulse 84 05/24/21 1254  Resp 16 05/24/21 1254  SpO2 99 % 05/24/21 1254  Vitals shown include unvalidated device data.  Last Pain:  Vitals:   05/24/21 0926  TempSrc: Oral  PainSc: 0-No pain         Complications: No notable events documented.

## 2021-05-24 NOTE — Op Note (Addendum)
05/23/2021 - 05/24/2021  12:16 PM  PATIENT:  Ricky [name]  50 y.o. male  PRE-OPERATIVE DIAGNOSIS:  RECURRENT SMALL BOWEL OBSTRUCTION  POST-OPERATIVE DIAGNOSIS:  RECURRENT SMALL BOWEL OBSTRUCTION   PROCEDURE:  Procedure(s): LAPAROSCOPY DIAGNOSTIC (N/A) EXPLORATORY LAPAROTOMY (N/A) SMALL BOWEL RESECTION (N/A)  SURGEON:  Surgeon(s) and Role:    * Griselda Miner, MD - Primary  PHYSICIAN ASSISTANT:   ASSISTANTS: Trixie Deis, PA   ANESTHESIA:   local and general  EBL:  25 mL   BLOOD ADMINISTERED:none  DRAINS: none   LOCAL MEDICATIONS USED:  MARCAINE     SPECIMEN:  Source of Specimen:  small bowel segment  DISPOSITION OF SPECIMEN:  PATHOLOGY  COUNTS:  YES  TOURNIQUET:  * No tourniquets in log *  DICTATION: .Dragon Dictation  After informed consent was obtained the patient was brought to the operating room and placed in the supine position on the operating table.  After adequate induction of general anesthesia the patient's abdomen was prepped with ChloraPrep, allowed to dry, and draped in usual sterile manner.  An appropriate timeout was performed.  The site was chosen in the left upper quadrant to access the abdominal cavity.  This area was infiltrated with quarter percent Marcaine.  A small stab incision was made with a 15 blade knife.  A 5 mm Optiview port and camera were used to dissected bluntly through the layers of the abdominal wall until access was gained to the abdominal cavity under direct vision.  The abdomen is then insufflated with carbon dioxide without difficulty.  The abdomen was inspected and there were no adhesions to the anterior abdominal wall.  2 other 5 mm ports were placed in the left mid and left lower quadrants under direct vision.  The small bowel was then run from the ileocecal valve to the ligament of Treitz.  I was able to identify a narrowed thickened segment of small bowel and what appeared to be the ileum.  The rest of the bowel had a very normal  appearance.  No other abnormalities were noted on general inspection of the abdominal cavity.  I then made a small midline incision just above the umbilicus with a 10 blade knife.  The incision was carried through the skin and subcutaneous tissue sharply with electrocautery until the linea alba was identified.  The linea alba was incised with the electrocautery.  The peritoneum was also excised sharply with the electrocautery.  This allowed access to the abdominal cavity.  A small wound protector was then deployed.  The small bowel segment was then identified and pulled up through the abdominal incision.  The mesentery above and below the area of narrowing was opened sharply with the electrocautery.  Each site was then divided with a single firing of a GIA-75 stapler.  The mesentery to the narrowed segment was taken down sharply with the LigaSure.  The segment of small bowel was removed and marked with a stitch on the proximal staple line.  It was sent to pathology for further evaluation.  Next a small opening was made on the antimesenteric end of the proximal and distal segments of small bowel.  Each limb of a GIA-75 stapler was then placed down the appropriate limb of small bowel, clamped, and fired thereby creating a nice widely patent enteroenterostomy.  The common opening was then closed with a single firing of a TA 60 stapler.  The staple line was imbricated with multiple 2-0 silk Lembert stitches as well as a 2-0 silk  crotch stitch.  The mesentery was then closed with interrupted 2-0 silk stitches.  The anastomosis was widely patent and appeared healthy.  The small bowel was then allowed to drop back into the abdominal cavity.  The abdomen was irrigated with copious amounts of saline.  The fascial defect was then closed with 2 running #1 double-stranded looped PDS sutures.  The subcutaneous tissue was irrigated with saline and the skin was closed with a 4-0 Monocryl subcuticular stitch.  The rest of the port  site incisions were closed with interrupted 4-0 Monocryl subcuticular stitches.  Dermabond dressings were applied.  The patient tolerated the procedure well.  At the end of the case all needle sponge and instrument counts were correct.  The patient was then awakened and taken to recovery in stable condition.  PLAN OF CARE: Admit to inpatient   PATIENT DISPOSITION:  PACU - hemodynamically stable.   Delay start of Pharmacological VTE agent (>24hrs) due to surgical blood loss or risk of bleeding: no

## 2021-05-24 NOTE — Anesthesia Procedure Notes (Signed)
Procedure Name: Intubation Date/Time: 05/24/2021 11:02 AM Performed by: Lelon Perla, CRNA Pre-anesthesia Checklist: Patient identified, Emergency Drugs available, Suction available and Patient being monitored Patient Re-evaluated:Patient Re-evaluated prior to induction Oxygen Delivery Method: Circle system utilized Preoxygenation: Pre-oxygenation with 100% oxygen Induction Type: Rapid sequence, Cricoid Pressure applied and IV induction Laryngoscope Size: Glidescope and 4 Grade View: Grade I Tube type: Oral Tube size: 7.5 mm Number of attempts: 1 Airway Equipment and Method: Oral airway, Rigid stylet and Video-laryngoscopy Placement Confirmation: ETT inserted through vocal cords under direct vision, positive ETCO2 and breath sounds checked- equal and bilateral Secured at: 23 cm Tube secured with: Tape Dental Injury: Teeth and Oropharynx as per pre-operative assessment

## 2021-05-24 NOTE — Progress Notes (Signed)
Patient arrived to room 6N5 from [facility] via bed. Patient is alert and oriented times 4. VSS. NAD. Belongings and call bell within reach. Bed is in the lowest and locked position with bed rails up times 2. Patient oriented to unit and call bell. Patient updated as to plan of care and all questions addressed at this time. 3 Lap sites with skin glue C/D/I.

## 2021-05-24 NOTE — ED Notes (Signed)
Pt complaining of irritation with R IV and requesting another IV. This RN placed new IV in L FA and fluids are running through it.

## 2021-05-24 NOTE — Progress Notes (Signed)
Subjective: Patient admitted this morning, see detailed H&P by Dr De Hollingshead 50 year old male with medical history of hypertension, hyperlipidemia, 2 recent hospitalizations for SBO, last discharged 9/23 from [facility] came with abdominal pain, nausea and vomiting.  In the ED CT scan of the abdomen pelvis showed dilated central small bowel loops with proximal distal transition points with some swirling of the central mesentery possibly suggestive of closed-loop obstruction. General surgery was consulted  Vitals:   05/24/21 0815 05/24/21 0926  BP: 128/89 (!) 146/99  Pulse: 64 (!) 58  Resp: 15 16  Temp:  98.5 F (36.9 C)  SpO2: 100% 98%      A/P Small bowel obstruction -Plan for exploratory laparotomy and lysis of adhesions per general surgery -Continue IV fluids -Keep n.p.o.  Hypertension -Start hydralazine 10 mg IV as needed  Hypokalemia -Potassium is 3.0, was replaced yesterday -Check BMP today  Meredeth Ide [facility] [phone]

## 2021-05-24 NOTE — Anesthesia Postprocedure Evaluation (Signed)
Anesthesia Post Note  Patient: Ricky [name]  Procedure(s) Performed: LAPAROSCOPY DIAGNOSTIC (Abdomen) EXPLORATORY LAPAROTOMY (Abdomen) SMALL BOWEL RESECTION (Abdomen)     Patient location during evaluation: PACU Anesthesia Type: General Level of consciousness: awake and alert Pain management: pain level controlled Vital Signs Assessment: post-procedure vital signs reviewed and stable Respiratory status: spontaneous breathing, nonlabored ventilation, respiratory function stable and patient connected to nasal cannula oxygen Cardiovascular status: blood pressure returned to baseline and stable Postop Assessment: no apparent nausea or vomiting Anesthetic complications: no   No notable events documented.  Last Vitals:  Vitals:   05/24/21 1324 05/24/21 1337  BP: 117/81 119/87  Pulse:  70  Resp:  16  Temp:  36.9 C  SpO2:  94%    Last Pain:  Vitals:   05/24/21 1337  TempSrc: Oral  PainSc:                  [name]

## 2021-05-24 NOTE — Interval H&P Note (Signed)
History and Physical Interval Note:  05/24/2021 10:32 AM  Ricky [name]  has presented today for surgery, with the diagnosis of RECURRENT SBL.  The various methods of treatment have been discussed with the patient and family. After consideration of risks, benefits and other options for treatment, the patient has consented to  Procedure(s): LAPAROSCOPY DIAGNOSTIC POSSIBLE BOWEL RESECTION (N/A) EXPLORATORY LAPAROTOMY (N/A) as a surgical intervention.  The patient's history has been reviewed, patient examined, no change in status, stable for surgery.  I have reviewed the patient's chart and labs.  Questions were answered to the patient's satisfaction.     Chevis Pretty III

## 2021-05-24 NOTE — Anesthesia Preprocedure Evaluation (Addendum)
Anesthesia Evaluation  Patient identified by MRN, date of birth, ID band Patient awake    Reviewed: Allergy & Precautions, NPO status , Patient's Chart, lab work & pertinent test results  Airway Mallampati: I  TM Distance: >3 FB Neck ROM: Limited    Dental  (+) Dental Advisory Given, Chipped,    Pulmonary sleep apnea , former smoker,    Pulmonary exam normal breath sounds clear to auscultation       Cardiovascular hypertension, Pt. on medications Normal cardiovascular exam Rhythm:Regular Rate:Normal  HLD  TTE 2020 1. Left ventricular ejection fraction, by visual estimation, is 50 to  55%. The left ventricle has low normal function. There is mildly increased  left ventricular hypertrophy.  2. Mildly dilated left ventricular internal cavity size.  3. The left ventricle demonstrates regional wall motion abnormalities.  Septal hypokinesis  4. Global right ventricle has normal systolic function.The right  ventricular size is normal. No increase in right ventricular wall  thickness.  5. Left atrial size was normal.  6. Right atrial size was normal.  7. The mitral valve is normal in structure. No evidence of mitral valve  regurgitation.  8. The tricuspid valve is normal in structure. Tricuspid valve  regurgitation is not demonstrated.  9. The aortic valve is tricuspid. Aortic valve regurgitation is not  visualized. No evidence of aortic valve sclerosis or stenosis.  10. The inferior vena cava is normal in size with greater than 50%  respiratory variability, suggesting right atrial pressure of 3 mmHg.  11. Aneurysm of the ascending aorta, measuring 48 mm.   Stress Test 2020 Normal pharmacologic nuclear stress test with no evidence for prior infarct or ischemia. LVEF not calculated. Frequent PVCs.   Neuro/Psych PSYCHIATRIC DISORDERS Anxiety Depression negative neurological ROS     GI/Hepatic negative GI ROS, Neg liver  ROS,   Endo/Other  negative endocrine ROSObese BMI 36  Renal/GU negative Renal ROS  negative genitourinary   Musculoskeletal  (+) Arthritis , S/p cervical fusion   Abdominal   Peds  Hematology negative hematology ROS (+)   Anesthesia Other Findings SBO  Reproductive/Obstetrics                            Anesthesia Physical Anesthesia Plan  ASA: 3  Anesthesia Plan: General   Post-op Pain Management:    Induction: Intravenous  PONV Risk Score and Plan: 2 and Midazolam, Dexamethasone and Ondansetron  Airway Management Planned: Oral ETT and Video Laryngoscope Planned  Additional Equipment:   Intra-op Plan:   Post-operative Plan: Extubation in OR  Informed Consent: I have reviewed the patients History and Physical, chart, labs and discussed the procedure including the risks, benefits and alternatives for the proposed anesthesia with the patient or authorized representative who has indicated his/her understanding and acceptance.     Dental advisory given  Plan Discussed with: CRNA  Anesthesia Plan Comments:        Anesthesia Quick Evaluation

## 2021-05-25 ENCOUNTER — Encounter (HOSPITAL_COMMUNITY): Payer: Self-pay | Admitting: General Surgery

## 2021-05-25 MED ORDER — POTASSIUM CHLORIDE CRYS ER 20 MEQ PO TBCR
40.0000 meq | EXTENDED_RELEASE_TABLET | ORAL | Status: AC
  Administered 2021-05-25 (×2): 40 meq via ORAL
  Filled 2021-05-25 (×2): qty 2

## 2021-05-25 NOTE — Progress Notes (Signed)
PROGRESS NOTE    Ricky [name]  BJS:[id number] DOB: 1971-06-05 DOA: 05/23/2021 PCP: Maryagnes Amos, FNP  Outpatient Specialists:   Brief Narrative:  Patient is a 50 year old African-American male with past medical history significant for recurrent small bowel obstruction, hypertension and hyperlipidemia.  Patient was admitted with another episode of small bowel obstruction.  Patient underwent laparoscopic diagnostic/exploratory laparotomy with small bowel resection yesterday, 05/24/2021.  Patient reports passing wind.  05/25/2021: Patient seen alongside patient's mother and nurse.  Patient continues to report progress.  Pain is controlled.  Assessment & Plan:   Principal Problem:   Small bowel obstruction (HCC) Active Problems:   Essential hypertension   SBO (small bowel obstruction) (HCC)   Mixed hyperlipidemia   Hypokalemia due to excessive gastrointestinal loss of potassium   * Small bowel obstruction [facility]) Patient presenting with several days of abdominal pain, nausea and vomiting CT imaging revealing evidence of dilated central bowel loops with proximal and distal transition points with some swirling of the central mesentery concerning for closed-loop obstruction secondary to internal hernia ER providers already discussed case with Dr. Freida Busman with general surgery who will come to evaluate in consultation Patient currently n.p.o. Hydrating patient aggressively with intravenous isotonic fluids As needed antiemetics As needed opiate-based analgesics for associated pain NG tube placement if patient begins to exhibit recurrent vomiting.   Dilated central small bowel loops. Proximal and distal transition points are present with some swirling of the central mesentery. Findings may be related to closed loop obstruction secondary to internal hernia. Surgical consultation recommended. No pneumatosis or significant mesenteric edema.  05/25/2021: Patient underwent laparoscopic  resection of small bowel yesterday.  Patient continues to report progress.  Surgical team is managing.  Input usually appreciated.   Hypokalemia due to excessive gastrointestinal loss of potassium Potassium is 3.2 earlier today. K-Dur 40 M EQ p.o. every 4x2 doses. Repeat BMP in the morning.   Will check magnesium level in the morning.     Essential hypertension   PRN intravenous antihypertensives for excessively elevated blood pressure         Mixed hyperlipidemia Continuing home regimen of lipid lowering therapy.     DVT prophylaxis: Subcutaneous Lovenox. Code Status: Full code. Family Communication:  Patient's mother. Disposition Plan: Home eventually   Consultants:  Surgical history  Procedures:  Laparoscopic resection of small bowel  Antimicrobials:  None   Subjective: No new complaints. Patient has broken wound. No abdominal distention.  Objective: Vitals:   05/24/21 2009 05/25/21 0500 05/25/21 0737 05/25/21 1635  BP: (!) 149/92 130/85 127/83 (!) 133/98  Pulse: 63 75 60 66  Resp: 18 18 16 17   Temp: 99.1 F (37.3 C) 99.1 F (37.3 C) 98.8 F (37.1 C) 98.7 F (37.1 C)  TempSrc: Oral Oral Oral Oral  SpO2: 97% 97% 96% 97%  Weight:      Height:        Intake/Output Summary (Last 24 hours) at 05/25/2021 1744 Last data filed at 05/25/2021 0700 Gross per 24 hour  Intake 1056.26 ml  Output 425 ml  Net 631.26 ml   Filed Weights   05/23/21 1501  Weight: 124 kg    Examination:  General exam: Appears calm and comfortable.  Patient is obese. Respiratory system: Clear to auscultation.  Cardiovascular system: S1 & S2  Gastrointestinal system: Abdomen is obese, soft with postsurgical clips.   Central nervous system: Alert and oriented.  Patient moves all extremities.   Extremities: No leg edema.  Data Reviewed: I have  personally reviewed following labs and imaging studies  CBC: Recent Labs  Lab 05/23/21 1541  WBC 6.0  NEUTROABS 2.9  HGB 14.0   HCT 44.2  MCV 89.3  PLT 130*   Basic Metabolic Panel: Recent Labs  Lab 05/23/21 1541 05/23/21 2359 05/24/21 1347  NA 140  --  138  K 3.0*  --  3.2*  CL 102  --  103  CO2 28  --  26  GLUCOSE 89  --  105*  BUN 14  --  8  CREATININE 0.80  --  0.82  CALCIUM 8.6*  --  8.4*  MG  --  1.8  --    GFR: Estimated Creatinine Clearance: 147 mL/min (by C-G formula based on SCr of 0.82 mg/dL). Liver Function Tests: Recent Labs  Lab 05/23/21 1541  AST 23  ALT 23  ALKPHOS 70  BILITOT 0.9  PROT 7.1  ALBUMIN 4.2   Recent Labs  Lab 05/23/21 1541  LIPASE 20   No results for input(s): AMMONIA in the last 168 hours. Coagulation Profile: No results for input(s): INR, PROTIME in the last 168 hours. Cardiac Enzymes: No results for input(s): CKTOTAL, CKMB, CKMBINDEX, TROPONINI in the last 168 hours. BNP (last 3 results) No results for input(s): PROBNP in the last 8760 hours. HbA1C: No results for input(s): HGBA1C in the last 72 hours. CBG: No results for input(s): GLUCAP in the last 168 hours. Lipid Profile: No results for input(s): CHOL, HDL, LDLCALC, TRIG, CHOLHDL, LDLDIRECT in the last 72 hours. Thyroid Function Tests: No results for input(s): TSH, T4TOTAL, FREET4, T3FREE, THYROIDAB in the last 72 hours. Anemia Panel: No results for input(s): VITAMINB12, FOLATE, FERRITIN, TIBC, IRON, RETICCTPCT in the last 72 hours. Urine analysis: No results found for: COLORURINE, APPEARANCEUR, LABSPEC, PHURINE, GLUCOSEU, HGBUR, BILIRUBINUR, KETONESUR, PROTEINUR, UROBILINOGEN, NITRITE, LEUKOCYTESUR Sepsis Labs: @LABRCNTIP (procalcitonin:4,lacticidven:4)  ) Recent Results (from the past 240 hour(s))  Resp Panel by RT-PCR (Flu A&B, Covid) Nasopharyngeal Swab     Status: None   Collection Time: 05/23/21  9:23 PM   Specimen: Nasopharyngeal Swab; Nasopharyngeal(NP) swabs in vial transport medium  Result Value Ref Range Status   SARS Coronavirus 2 by RT PCR NEGATIVE NEGATIVE Final    Comment:  (NOTE) SARS-CoV-2 target nucleic acids are NOT DETECTED.  The SARS-CoV-2 RNA is generally detectable in upper respiratory specimens during the acute phase of infection. The lowest concentration of SARS-CoV-2 viral copies this assay can detect is 138 copies/mL. A negative result does not preclude SARS-Cov-2 infection and should not be used as the sole basis for treatment or other patient management decisions. A negative result may occur with  improper specimen collection/handling, submission of specimen other than nasopharyngeal swab, presence of viral mutation(s) within the areas targeted by this assay, and inadequate number of viral copies(<138 copies/mL). A negative result must be combined with clinical observations, patient history, and epidemiological information. The expected result is Negative.  Fact Sheet for Patients:  07/23/21  Fact Sheet for Healthcare Providers:  BloggerCourse.com  This test is no t yet approved or cleared by the [employer] FDA and  has been authorized for detection and/or diagnosis of SARS-CoV-2 by FDA under an Emergency Use Authorization (EUA). This EUA will remain  in effect (meaning this test can be used) for the duration of the COVID-19 declaration under Section 564(b)(1) of the Act, 21 U.S.C.section 360bbb-3(b)(1), unless the authorization is terminated  or revoked sooner.       Influenza A by PCR NEGATIVE NEGATIVE Final  Influenza B by PCR NEGATIVE NEGATIVE Final    Comment: (NOTE) The Xpert Xpress SARS-CoV-2/FLU/RSV plus assay is intended as an aid in the diagnosis of influenza from Nasopharyngeal swab specimens and should not be used as a sole basis for treatment. Nasal washings and aspirates are unacceptable for Xpert Xpress SARS-CoV-2/FLU/RSV testing.  Fact Sheet for Patients: BloggerCourse.com  Fact Sheet for Healthcare  Providers: SeriousBroker.it  This test is not yet approved or cleared by the [place] FDA and has been authorized for detection and/or diagnosis of SARS-CoV-2 by FDA under an Emergency Use Authorization (EUA). This EUA will remain in effect (meaning this test can be used) for the duration of the COVID-19 declaration under Section 564(b)(1) of the Act, 21 U.S.C. section 360bbb-3(b)(1), unless the authorization is terminated or revoked.  Performed at [facility], [address], [place], [place] [address]          Radiology Studies: No results found.      Scheduled Meds:  carvedilol  25 mg Oral BID   cyclobenzaprine  10 mg Oral TID   enoxaparin (LOVENOX) injection  40 mg Subcutaneous Q24H   melatonin  10 mg Oral QHS   pantoprazole (PROTONIX) IV  40 mg Intravenous QHS   Continuous Infusions:  0.9 % NaCl with KCl 20 mEq / L 75 mL/hr at 05/25/21 0357     LOS: 2 days    Time spent: 35 minutes    Berton Mount, MD  [facility] Pager #: [phone] 7PM-7AM contact night coverage as above

## 2021-05-25 NOTE — Plan of Care (Signed)
  Problem: Education: Goal: Required Educational Video(s) Outcome: Progressing   Problem: Clinical Measurements: Goal: Postoperative complications will be avoided or minimized Outcome: Progressing   Problem: Skin Integrity: Goal: Demonstration of wound healing without infection will improve Outcome: Progressing   

## 2021-05-25 NOTE — Progress Notes (Signed)
MD on call notified that patient had medication requests New orders received will continue to monitor. Ilean Skill LPN

## 2021-05-25 NOTE — Progress Notes (Signed)
Assessment & Plan: POD#1 - status post small bowel resection - 05/24/2021 Dr. Carolynne [name]  Clear liquid diet - some nausea last PM  Ambulating in halls  Oxy for pain - trying to avoid fentanyl  IVF  Encouraged ambulation today.  Will stay with clear liquids for now.        Ricky Level, MD       [facility]       Office: [phone]   Chief Complaint: SBO, recurrent  Subjective: Patient in bed, some nausea last PM. Passing flatus.  Objective: Vital signs in last 24 hours: Temp:  [97.9 F (36.6 C)-99.1 F (37.3 C)] 98.8 F (37.1 C) (10/08 0737) Pulse Rate:  [60-79] 60 (10/08 0737) Resp:  [10-18] 16 (10/08 0737) BP: (117-149)/(81-94) 127/83 (10/08 0737) SpO2:  [90 %-100 %] 96 % (10/08 0737) Last BM Date: 05/22/21  Intake/Output from previous day: 10/07 0701 - 10/08 0700 In: 3352 [I.V.:3252; IV Piggyback:100] Out: 600 [Urine:575; Blood:25] Intake/Output this shift: No intake/output data recorded.  Physical Exam: HEENT - sclerae clear, mucous membranes moist Neck - soft Abdomen - soft, wounds dry and intact; mild tenderness RLQ Ext - no edema, non-tender Neuro - alert & oriented, no focal deficits  Lab Results:  Recent Labs    05/23/21 1541  WBC 6.0  HGB 14.0  HCT 44.2  PLT 130*   BMET Recent Labs    05/23/21 1541 05/24/21 1347  NA 140 138  K 3.0* 3.2*  CL 102 103  CO2 28 26  GLUCOSE 89 105*  BUN 14 8  CREATININE 0.80 0.82  CALCIUM 8.6* 8.4*   PT/INR No results for input(s): LABPROT, INR in the last 72 hours. Comprehensive Metabolic Panel:    Component Value Date/Time   NA 138 05/24/2021 1347   NA 140 05/23/2021 1541   NA 143 04/04/2021 1018   NA 141 01/23/2021 1047   K 3.2 (L) 05/24/2021 1347   K 3.0 (L) 05/23/2021 1541   CL 103 05/24/2021 1347   CL 102 05/23/2021 1541   CO2 26 05/24/2021 1347   CO2 28 05/23/2021 1541   BUN 8 05/24/2021 1347   BUN 14 05/23/2021 1541   BUN 12 04/04/2021 1018   BUN 9 01/23/2021 1047    CREATININE 0.82 05/24/2021 1347   CREATININE 0.80 05/23/2021 1541   GLUCOSE 105 (H) 05/24/2021 1347   GLUCOSE 89 05/23/2021 1541   CALCIUM 8.4 (L) 05/24/2021 1347   CALCIUM 8.6 (L) 05/23/2021 1541   AST 23 05/23/2021 1541   AST 23 05/09/2021 0319   ALT 23 05/23/2021 1541   ALT 22 05/09/2021 0319   ALKPHOS 70 05/23/2021 1541   ALKPHOS 61 05/09/2021 0319   BILITOT 0.9 05/23/2021 1541   BILITOT 0.9 05/09/2021 0319   BILITOT 0.4 07/23/2020 1429   BILITOT 0.5 04/05/2020 1541   PROT 7.1 05/23/2021 1541   PROT 6.0 (L) 05/09/2021 0319   PROT 7.0 07/23/2020 1429   PROT 7.3 04/05/2020 1541   ALBUMIN 4.2 05/23/2021 1541   ALBUMIN 3.2 (L) 05/09/2021 0319   ALBUMIN 4.2 07/23/2020 1429   ALBUMIN 4.5 04/05/2020 1541    Studies/Results: CT ABDOMEN PELVIS W CONTRAST  Result Date: 05/23/2021 CLINICAL DATA:  Bowel obstruction suspected. EXAM: CT ABDOMEN AND PELVIS WITH CONTRAST TECHNIQUE: Multidetector CT imaging of the abdomen and pelvis was performed using the standard protocol following bolus administration of intravenous contrast. CONTRAST:  OMNIPAQUE IOHEXOL 300 MG/ML  SOLN COMPARISON:  CT abdomen and  pelvis 05/06/2020. FINDINGS: Lower chest: No acute abnormality. Hepatobiliary: No focal liver abnormality is seen. No gallstones, gallbladder wall thickening, or biliary dilatation. Pancreas: Unremarkable. No pancreatic ductal dilatation or surrounding inflammatory changes. Spleen: Normal in size without focal abnormality. Adrenals/Urinary Tract: Adrenal glands are unremarkable. Kidneys are normal, without renal calculi, focal lesion, or hydronephrosis. Bladder is unremarkable. Stomach/Bowel: There are dilated small bowel loops measuring up to 5.6 cm. Transition point is visualized visualized in the central abdomen image 3/34. Transition point is also visualized in the central abdomen image 3/51. There is some swirling of central mesentery just inferior to this [name]. Small bowel loops proximal and  distal to this [name] appear decompressed. Stomach is nondilated. Colon is nondilated. The appendix is not definitely visualized. No pneumatosis or free air. No significant mesenteric edema. Vascular/Lymphatic: No significant vascular findings are present. No enlarged abdominal or pelvic lymph nodes. Reproductive: Prostate is unremarkable. Other: No abdominal wall hernia or abnormality. No abdominopelvic ascites. Musculoskeletal: L4-L5 lumbar fusion hardware is present. IMPRESSION: 1. Dilated central small bowel loops. Proximal and distal transition points are present with some swirling of the central mesentery. Findings may be related to closed loop obstruction secondary to internal hernia. Surgical consultation recommended. No pneumatosis or significant mesenteric edema. Electronically Signed   By: Darliss Cheney M.D.   On: 05/23/2021 17:59      Ricky [name] 05/25/2021   Patient ID: Ricky [name], male   DOB: 06-14-1971, 50 y.o.   MRN: [id number]

## 2021-05-26 LAB — RENAL FUNCTION PANEL
Albumin: 3 g/dL — ABNORMAL LOW (ref 3.5–5.0)
Anion gap: 10 (ref 5–15)
BUN: <5 mg/dL — ABNORMAL LOW (ref 6–20)
CO2: 25 mmol/L (ref 22–32)
Calcium: 8.4 mg/dL — ABNORMAL LOW (ref 8.9–10.3)
Chloride: 103 mmol/L (ref 98–111)
Creatinine, Ser: 0.83 mg/dL (ref 0.61–1.24)
GFR, Estimated: >60 mL/min (ref >60)
Glucose, Bld: 78 mg/dL (ref 70–99)
Phosphorus: 2.1 mg/dL — ABNORMAL LOW (ref 2.5–4.6)
Potassium: 3.6 mmol/L (ref 3.5–5.1)
Sodium: 138 mmol/L (ref 135–145)

## 2021-05-26 LAB — MAGNESIUM: Magnesium: 1.6 mg/dL — ABNORMAL LOW (ref 1.7–2.4)

## 2021-05-26 MED ORDER — MAGNESIUM SULFATE 2 GM/50ML IV SOLN
2.0000 g | Freq: Once | INTRAVENOUS | Status: AC
  Administered 2021-05-26: 2 g via INTRAVENOUS
  Filled 2021-05-26 (×2): qty 50

## 2021-05-26 MED ORDER — POTASSIUM CHLORIDE CRYS ER 20 MEQ PO TBCR
40.0000 meq | EXTENDED_RELEASE_TABLET | Freq: Once | ORAL | Status: AC
  Administered 2021-05-26: 40 meq via ORAL
  Filled 2021-05-26: qty 2

## 2021-05-26 NOTE — Progress Notes (Signed)
Assessment & Plan: POD#2 - status post small bowel resection - 05/24/2021 Dr. Carolynne Edouard             Nausea resolved - tolerated clears - advance to regular diet today             Ambulating in halls             Oxy for pain - one dose last 24 hours             IVF   Encouraged ambulation today.  Trial regular diet today.  Likely home tomorrow.         [name], MD       [facility]       Office: [phone]   Chief Complaint: SBO  Subjective: Patient much improved this AM, no nausea, pain better.  Ambulating in halls. Passing flatus.  Objective: Vital signs in last 24 hours: Temp:  [98.5 F (36.9 C)-99.1 F (37.3 C)] 98.5 F (36.9 C) (10/09 0516) Pulse Rate:  [58-66] 58 (10/09 0516) Resp:  [16-17] 16 (10/09 0516) BP: (129-141)/(94-99) 141/94 (10/09 0516) SpO2:  [97 %] 97 % (10/09 0516) Last BM Date: 05/22/21  Intake/Output from previous day: 10/08 0701 - 10/09 0700 In: 873.5 [I.V.:873.5] Out: 300 [Urine:300] Intake/Output this shift: No intake/output data recorded.  Physical Exam: HEENT - sclerae clear, mucous membranes moist Neck - soft Abdomen - softer, wound dry and intact Ext - no edema, non-tender Neuro - alert & oriented, no focal deficits  Lab Results:  Recent Labs    05/23/21 1541  WBC 6.0  HGB 14.0  HCT 44.2  PLT 130*   BMET Recent Labs    05/24/21 1347 05/26/21 0151  NA 138 138  K 3.2* 3.6  CL 103 103  CO2 26 25  GLUCOSE 105* 78  BUN 8 <5*  CREATININE 0.82 0.83  CALCIUM 8.4* 8.4*   PT/INR No results for input(s): LABPROT, INR in the last 72 hours. Comprehensive Metabolic Panel:    Component Value Date/Time   NA 138 05/26/2021 0151   NA 138 05/24/2021 1347   NA 143 04/04/2021 1018   NA 141 01/23/2021 1047   K 3.6 05/26/2021 0151   K 3.2 (L) 05/24/2021 1347   CL 103 05/26/2021 0151   CL 103 05/24/2021 1347   CO2 25 05/26/2021 0151   CO2 26 05/24/2021 1347   BUN <5 (L) 05/26/2021 0151   BUN 8 05/24/2021  1347   BUN 12 04/04/2021 1018   BUN 9 01/23/2021 1047   CREATININE 0.83 05/26/2021 0151   CREATININE 0.82 05/24/2021 1347   GLUCOSE 78 05/26/2021 0151   GLUCOSE 105 (H) 05/24/2021 1347   CALCIUM 8.4 (L) 05/26/2021 0151   CALCIUM 8.4 (L) 05/24/2021 1347   AST 23 05/23/2021 1541   AST 23 05/09/2021 0319   ALT 23 05/23/2021 1541   ALT 22 05/09/2021 0319   ALKPHOS 70 05/23/2021 1541   ALKPHOS 61 05/09/2021 0319   BILITOT 0.9 05/23/2021 1541   BILITOT 0.9 05/09/2021 0319   BILITOT 0.4 07/23/2020 1429   BILITOT 0.5 04/05/2020 1541   PROT 7.1 05/23/2021 1541   PROT 6.0 (L) 05/09/2021 0319   PROT 7.0 07/23/2020 1429   PROT 7.3 04/05/2020 1541   ALBUMIN 3.0 (L) 05/26/2021 0151   ALBUMIN 4.2 05/23/2021 1541   ALBUMIN 4.2 07/23/2020 1429   ALBUMIN 4.5 04/05/2020 1541    Studies/Results: No results found.    [name]  05/26/2021   Patient ID: Ricky [name], male   DOB: October 10, 1970, 50 y.o.   MRN: [id number]

## 2021-05-26 NOTE — Progress Notes (Signed)
PROGRESS NOTE    ROLLAN ROGER  NWG:[id number] DOB: 11-12-1970 DOA: 05/23/2021 PCP: Maryagnes Amos, FNP  Outpatient Specialists:   Brief Narrative:  Patient is a 50 year old African-American male with past medical history significant for recurrent small bowel obstruction, hypertension and hyperlipidemia.  Patient was admitted with another episode of small bowel obstruction.  Patient underwent laparoscopic diagnostic/exploratory laparotomy with small bowel resection yesterday, 05/24/2021.  Patient reports passing wind.  05/25/2021: Patient seen alongside patient's mother and nurse.  Patient continues to report progress.  Pain is controlled.  05/26/2021: Patient seen alongside patient's family (partner and mother).  Patient continues to improve.  Surgical team input is highly appreciated.  Patient continues to break wind. No bowel movement.  Diet has been advanced to full diet.  Likely discharge back home tomorrow as per surgical team.  Assessment & Plan:   Principal Problem:   Small bowel obstruction (HCC) Active Problems:   Essential hypertension   SBO (small bowel obstruction) (HCC)   Mixed hyperlipidemia   Hypokalemia due to excessive gastrointestinal loss of potassium   * Small bowel obstruction (HCC) Patient presenting with several days of abdominal pain, nausea and vomiting CT imaging revealing evidence of dilated central bowel loops with proximal and distal transition points with some swirling of the central mesentery concerning for closed-loop obstruction secondary to internal hernia ER providers already discussed case with Dr. Freida Busman with general surgery who will come to evaluate in consultation Patient currently n.p.o. Hydrating patient aggressively with intravenous isotonic fluids As needed antiemetics As needed opiate-based analgesics for associated pain   Dilated central small bowel loops. Proximal and distal transition points are present with some swirling of  the central mesentery. Findings may be related to closed loop obstruction secondary to internal hernia. Surgical consultation recommended. No pneumatosis or significant mesenteric edema.  05/25/2021: Patient underwent laparoscopic resection of small bowel yesterday.  Patient continues to report progress.  Surgical team is managing.  Input usually appreciated.  05/26/2021: Patient continues to improve.  No bowel movement so far.  Patient On full diet.  Possible discharge tomorrow   Hypokalemia due to excessive gastrointestinal loss of potassium Potassium is 3.2 earlier today. K-Dur 40 M EQ p.o. every 4x2 doses. Repeat BMP in the morning.   Will check magnesium level in the morning. 05/26/2021: Potassium is 3.6 today.  Magnesium is 1.6.  Will replete.     Essential hypertension   PRN intravenous antihypertensives for excessively elevated blood pressure         Mixed hyperlipidemia Continuing home regimen of lipid lowering therapy.     DVT prophylaxis: Subcutaneous Lovenox. Code Status: Full code. Family Communication:  Patient's mother. Disposition Plan: Home eventually   Consultants:  Surgical history  Procedures:  Laparoscopic resection of small bowel  Antimicrobials:  None   Subjective: No new complaints. Patient is passing flatus. No abdominal distention. No bowel movement so far.  Objective: Vitals:   05/25/21 2135 05/26/21 0516 05/26/21 1100 05/26/21 1632  BP: (!) 129/99 (!) 141/94 135/85 127/88  Pulse: (!) 58 (!) 58 (!) 52 60  Resp: 17 16 15 16   Temp: 99.1 F (37.3 C) 98.5 F (36.9 C)  98.6 F (37 C)  TempSrc: Oral Oral Oral Oral  SpO2: 97% 97% 97% 100%  Weight:      Height:        Intake/Output Summary (Last 24 hours) at 05/26/2021 1849 Last data filed at 05/26/2021 0640 Gross per 24 hour  Intake 873.49 ml  Output 300  ml  Net 573.49 ml    Filed Weights   05/23/21 1501  Weight: 124 kg    Examination:  General exam: Appears calm and  comfortable.  Patient is obese. Respiratory system: Clear to auscultation.  Cardiovascular system: S1 & S2  Gastrointestinal system: Abdomen is obese, soft with postsurgical clips.   Central nervous system: Alert and oriented.  Patient moves all extremities.   Extremities: No leg edema.  Data Reviewed: I have personally reviewed following labs and imaging studies  CBC: Recent Labs  Lab 05/23/21 1541  WBC 6.0  NEUTROABS 2.9  HGB 14.0  HCT 44.2  MCV 89.3  PLT 130*    Basic Metabolic Panel: Recent Labs  Lab 05/23/21 1541 05/23/21 2359 05/24/21 1347 05/26/21 0151  NA 140  --  138 138  K 3.0*  --  3.2* 3.6  CL 102  --  103 103  CO2 28  --  26 25  GLUCOSE 89  --  105* 78  BUN 14  --  8 <5*  CREATININE 0.80  --  0.82 0.83  CALCIUM 8.6*  --  8.4* 8.4*  MG  --  1.8  --  1.6*  PHOS  --   --   --  2.1*    GFR: Estimated Creatinine Clearance: 145.2 mL/min (by C-G formula based on SCr of 0.83 mg/dL). Liver Function Tests: Recent Labs  Lab 05/23/21 1541 05/26/21 0151  AST 23  --   ALT 23  --   ALKPHOS 70  --   BILITOT 0.9  --   PROT 7.1  --   ALBUMIN 4.2 3.0*    Recent Labs  Lab 05/23/21 1541  LIPASE 20    No results for input(s): AMMONIA in the last 168 hours. Coagulation Profile: No results for input(s): INR, PROTIME in the last 168 hours. Cardiac Enzymes: No results for input(s): CKTOTAL, CKMB, CKMBINDEX, TROPONINI in the last 168 hours. BNP (last 3 results) No results for input(s): PROBNP in the last 8760 hours. HbA1C: No results for input(s): HGBA1C in the last 72 hours. CBG: No results for input(s): GLUCAP in the last 168 hours. Lipid Profile: No results for input(s): CHOL, HDL, LDLCALC, TRIG, CHOLHDL, LDLDIRECT in the last 72 hours. Thyroid Function Tests: No results for input(s): TSH, T4TOTAL, FREET4, T3FREE, THYROIDAB in the last 72 hours. Anemia Panel: No results for input(s): VITAMINB12, FOLATE, FERRITIN, TIBC, IRON, RETICCTPCT in the last 72  hours. Urine analysis: No results found for: COLORURINE, APPEARANCEUR, LABSPEC, PHURINE, GLUCOSEU, HGBUR, BILIRUBINUR, KETONESUR, PROTEINUR, UROBILINOGEN, NITRITE, LEUKOCYTESUR Sepsis Labs: @LABRCNTIP (procalcitonin:4,lacticidven:4)  ) Recent Results (from the past 240 hour(s))  Resp Panel by RT-PCR (Flu A&B, Covid) Nasopharyngeal Swab     Status: None   Collection Time: 05/23/21  9:23 PM   Specimen: Nasopharyngeal Swab; Nasopharyngeal(NP) swabs in vial transport medium  Result Value Ref Range Status   SARS Coronavirus 2 by RT PCR NEGATIVE NEGATIVE Final    Comment: (NOTE) SARS-CoV-2 target nucleic acids are NOT DETECTED.  The SARS-CoV-2 RNA is generally detectable in upper respiratory specimens during the acute phase of infection. The lowest concentration of SARS-CoV-2 viral copies this assay can detect is 138 copies/mL. A negative result does not preclude SARS-Cov-2 infection and should not be used as the sole basis for treatment or other patient management decisions. A negative result may occur with  improper specimen collection/handling, submission of specimen other than nasopharyngeal swab, presence of viral mutation(s) within the areas targeted by this assay, and inadequate number  of viral copies(<138 copies/mL). A negative result must be combined with clinical observations, patient history, and epidemiological information. The expected result is Negative.  Fact Sheet for Patients:  BloggerCourse.com  Fact Sheet for Healthcare Providers:  SeriousBroker.it  This test is no t yet approved or cleared by the [place] FDA and  has been authorized for detection and/or diagnosis of SARS-CoV-2 by FDA under an Emergency Use Authorization (EUA). This EUA will remain  in effect (meaning this test can be used) for the duration of the COVID-19 declaration under Section 564(b)(1) of the Act, 21 U.S.C.section 360bbb-3(b)(1), unless  the authorization is terminated  or revoked sooner.       Influenza A by PCR NEGATIVE NEGATIVE Final   Influenza B by PCR NEGATIVE NEGATIVE Final    Comment: (NOTE) The Xpert Xpress SARS-CoV-2/FLU/RSV plus assay is intended as an aid in the diagnosis of influenza from Nasopharyngeal swab specimens and should not be used as a sole basis for treatment. Nasal washings and aspirates are unacceptable for Xpert Xpress SARS-CoV-2/FLU/RSV testing.  Fact Sheet for Patients: BloggerCourse.com  Fact Sheet for Healthcare Providers: SeriousBroker.it  This test is not yet approved or cleared by the [place] FDA and has been authorized for detection and/or diagnosis of SARS-CoV-2 by FDA under an Emergency Use Authorization (EUA). This EUA will remain in effect (meaning this test can be used) for the duration of the COVID-19 declaration under Section 564(b)(1) of the Act, 21 U.S.C. section 360bbb-3(b)(1), unless the authorization is terminated or revoked.  Performed at [facility], [address], [place], [place] [address]           Radiology Studies: No results found.      Scheduled Meds:  carvedilol  25 mg Oral BID   cyclobenzaprine  10 mg Oral TID   enoxaparin (LOVENOX) injection  40 mg Subcutaneous Q24H   melatonin  10 mg Oral QHS   pantoprazole (PROTONIX) IV  40 mg Intravenous QHS   Continuous Infusions:  0.9 % NaCl with KCl 20 mEq / L 50 mL/hr at 05/26/21 1124   magnesium sulfate bolus IVPB 2 g (05/26/21 1756)     LOS: 3 days    Time spent: 25 minutes    Berton Mount, MD  [facility] Pager #: [phone] 7PM-7AM contact night coverage as above

## 2021-05-27 LAB — RENAL FUNCTION PANEL
Albumin: 2.8 g/dL — ABNORMAL LOW (ref 3.5–5.0)
Anion gap: 6 (ref 5–15)
BUN: <5 mg/dL — ABNORMAL LOW (ref 6–20)
CO2: 26 mmol/L (ref 22–32)
Calcium: 8.1 mg/dL — ABNORMAL LOW (ref 8.9–10.3)
Chloride: 105 mmol/L (ref 98–111)
Creatinine, Ser: 0.74 mg/dL (ref 0.61–1.24)
GFR, Estimated: >60 mL/min (ref >60)
Glucose, Bld: 98 mg/dL (ref 70–99)
Phosphorus: 1.9 mg/dL — ABNORMAL LOW (ref 2.5–4.6)
Potassium: 3.8 mmol/L (ref 3.5–5.1)
Sodium: 137 mmol/L (ref 135–145)

## 2021-05-27 LAB — SURGICAL PATHOLOGY

## 2021-05-27 LAB — MAGNESIUM: Magnesium: 1.9 mg/dL (ref 1.7–2.4)

## 2021-05-27 MED ORDER — ACETAMINOPHEN 325 MG PO TABS: 650.0000 mg | ORAL_TABLET | ORAL | Status: AC | PRN

## 2021-05-27 MED ORDER — AZILSARTAN-CHLORTHALIDONE 40-25 MG PO TABS: 1.0000 | ORAL_TABLET | Freq: Every day | ORAL | Status: DC

## 2021-05-27 MED ORDER — SENNOSIDES-DOCUSATE SODIUM 8.6-50 MG PO TABS: 1.0000 | ORAL_TABLET | Freq: Every day | ORAL | Status: DC

## 2021-05-27 MED ORDER — TRAMADOL HCL 50 MG PO TABS: 50.0000 mg | ORAL_TABLET | Freq: Four times a day (QID) | ORAL | 0 refills | Status: AC | PRN

## 2021-05-27 MED ORDER — IRBESARTAN 150 MG PO TABS
150.0000 mg | ORAL_TABLET | Freq: Every day | ORAL | Status: DC
  Administered 2021-05-27: 150 mg via ORAL
  Filled 2021-05-27: qty 1

## 2021-05-27 MED ORDER — POLYETHYLENE GLYCOL 3350 17 G PO PACK: 17.0000 g | PACK | Freq: Every day | ORAL | 0 refills | Status: DC

## 2021-05-27 MED ORDER — POLYETHYLENE GLYCOL 3350 17 G PO PACK
17.0000 g | PACK | Freq: Every day | ORAL | Status: DC
  Administered 2021-05-27: 17 g via ORAL
  Filled 2021-05-27: qty 1

## 2021-05-27 NOTE — Progress Notes (Signed)
Patient discharged home with instructions. 

## 2021-05-27 NOTE — Progress Notes (Signed)
Progress Note  3 Days Post-Op  Subjective: Feeling well. Pain well controlled without opioid pain medications. Tolerating diet really well and passing large amounts of flatus but no BM. Ambulating   Objective: Vital signs in last 24 hours: Temp:  [98.4 F (36.9 C)-98.7 F (37.1 C)] 98.5 F (36.9 C) (10/10 0844) Pulse Rate:  [52-60] 53 (10/10 0844) Resp:  [15-18] 16 (10/10 0844) BP: (127-145)/(85-103) 143/103 (10/10 0844) SpO2:  [97 %-100 %] 98 % (10/10 0844) Last BM Date: 05/22/21  Intake/Output from previous day: 10/09 0701 - 10/10 0700 In: 1028 [P.O.:240; I.V.:788] Out: -  Intake/Output this shift: No intake/output data recorded.  PE: General: pleasant, WD, male who is sitting up in chair in NAD HEENT: head is normocephalic, atraumatic.  Mouth is pink and moist Heart: regular, rate, and rhythm.    Palpable radial pulses bilaterally Lungs: CTAB, no wheezes, rhonchi, or rales noted.  Respiratory effort nonlabored Abd: soft, ND, +BS, appropriate TTP without rebound or guarding. Incision with glue c/d/i MSK: all 4 extremities are symmetrical with no cyanosis, clubbing, or edema. Skin: warm and dry Psych: A&Ox3 with an appropriate affect.    Lab Results:  No results for input(s): WBC, HGB, HCT, PLT in the last 72 hours. BMET Recent Labs    05/26/21 0151 05/27/21 0027  NA 138 137  K 3.6 3.8  CL 103 105  CO2 25 26  GLUCOSE 78 98  BUN <5* <5*  CREATININE 0.83 0.74  CALCIUM 8.4* 8.1*   PT/INR No results for input(s): LABPROT, INR in the last 72 hours. CMP     Component Value Date/Time   NA 137 05/27/2021 0027   NA 143 04/04/2021 1018   K 3.8 05/27/2021 0027   CL 105 05/27/2021 0027   CO2 26 05/27/2021 0027   GLUCOSE 98 05/27/2021 0027   BUN <5 (L) 05/27/2021 0027   BUN 12 04/04/2021 1018   CREATININE 0.74 05/27/2021 0027   CALCIUM 8.1 (L) 05/27/2021 0027   PROT 7.1 05/23/2021 1541   PROT 7.0 07/23/2020 1429   ALBUMIN 2.8 (L) 05/27/2021 0027    ALBUMIN 4.2 07/23/2020 1429   AST 23 05/23/2021 1541   ALT 23 05/23/2021 1541   ALKPHOS 70 05/23/2021 1541   BILITOT 0.9 05/23/2021 1541   BILITOT 0.4 07/23/2020 1429   GFRNONAA >60 05/27/2021 0027   GFRAA 120 10/01/2020 1633   Lipase     Component Value Date/Time   LIPASE 20 05/23/2021 1541       Studies/Results: No results found.  Anti-infectives: Anti-infectives (From admission, onward)    Start     Dose/Rate Route Frequency Ordered Stop   05/24/21 0815  ceFAZolin (ANCEF) IVPB 2g/100 mL premix        2 g 200 mL/hr over 30 Minutes Intravenous  Once 05/24/21 0806 05/24/21 1137        Assessment/Plan  POD#3 - status post small bowel resection - 05/24/2021 Dr. Carolynne Edouard - tolerating regular diet and passing flatus but no BM -Ambulating in halls - Oxy for pain - one dose last 24 hours - IVF - miralax and Senakot daily added  FEN: regular ID: none currently VTE: lovenox   Disposition: stable for dc from general surgery once he has a bowel movement and could dc as early as today. Will send pain medication and arrange follow up. Pathology is pending      LOS: 4 days    Eric Form, [facility] 05/27/2021, 10:22 AM Please  see Amion for pager number during day hours 7:00am-4:30pm

## 2021-05-27 NOTE — Discharge Instructions (Signed)
[facility] ______[facility] °LAPAROSCOPIC SURGERY: POST OP INSTRUCTIONS °Always review your discharge instruction sheet given to you by the facility where your surgery was performed. °IF YOU HAVE DISABILITY OR FAMILY LEAVE FORMS, YOU MUST BRING THEM TO THE OFFICE FOR PROCESSING.   °DO NOT GIVE THEM TO YOUR DOCTOR. ° °A prescription for pain medication may be given to you upon discharge.  Take your pain medication as prescribed, if needed.  If narcotic pain medicine is not needed, then you may take acetaminophen (Tylenol) or ibuprofen (Advil) as needed. °Take your usually prescribed medications unless otherwise directed. °If you need a refill on your pain medication, please contact your pharmacy.  They will contact our office to request authorization. Prescriptions will not be filled after 5pm or on week-ends. °You should follow a light diet the first few days after arrival home, such as soup and crackers, etc.  Be sure to include lots of fluids daily. °Most patients will experience some swelling and bruising in the area of the incisions.  Ice packs will help.  Swelling and bruising can take several days to resolve.  °It is common to experience some constipation if taking pain medication after surgery.  Increasing fluid intake and taking a stool softener (such as Colace) will usually help or prevent this problem from occurring.  A mild laxative (Milk of Magnesia or Miralax) should be taken according to package instructions if there are no bowel movements after 48 hours. °Unless discharge instructions indicate otherwise, you may remove your bandages 24-48 hours after surgery, and you may shower at that time.  You may have steri-strips (small skin tapes) in place directly over the incision.  These strips should be left on the skin for 7-10 days.  If your surgeon used skin glue on the incision, you may shower in 24 hours.  The glue will flake off over the next 2-3 weeks.  Any sutures or staples will be  removed at the office during your follow-up visit. °ACTIVITIES:  You may resume regular (light) daily activities beginning the next day--such as daily self-care, walking, climbing stairs--gradually increasing activities as tolerated.  You may have sexual intercourse when it is comfortable.  Refrain from any heavy lifting or straining until approved by your doctor. °You may drive when you are no longer taking prescription pain medication, you can comfortably wear a seatbelt, and you can safely maneuver your car and apply brakes. °RETURN TO WORK:  __________________________________________________________ °You should see your doctor in the office for a follow-up appointment approximately 2-3 weeks after your surgery.  Make sure that you call for this appointment within a day or two after you arrive home to insure a convenient appointment time. °OTHER INSTRUCTIONS: __________________________________________________________________________________________________________________________ __________________________________________________________________________________________________________________________ °WHEN TO CALL YOUR DOCTOR: °Fever over 101.0 °Inability to urinate °Continued bleeding from incision. °Increased pain, redness, or drainage from the incision. °Increasing abdominal pain ° °The clinic staff is available to answer your questions during regular business hours.  Please don’t hesitate to call and ask to speak to one of the nurses for clinical concerns.  If you have a medical emergency, go to the nearest emergency room or call 911.  A surgeon from [facility] is always on call at the hospital. °[address], [address], [place], [place]  [address] ? [address] [address], [place], [place]   [address] °[phone] ? [phone] ? FAX [phone] °Web site: [email or web address] ° ° ° °Managing Your Pain After Surgery Without Opioids ° ° ° °Thank you for   participating in our program to  help patients manage their pain after surgery without opioids. This is part of our effort to provide you with the best care possible, without exposing you or your family to the risk that opioids pose. ° °What pain can I expect after surgery? °You can expect to have some pain after surgery. This is normal. The pain is typically worse the day after surgery, and quickly begins to get better. °Many studies have found that many patients are able to manage their pain after surgery with Over-the-Counter (OTC) medications such as Tylenol and Motrin. If you have a condition that does not allow you to take Tylenol or Motrin, notify your surgical team. ° °How will I manage my pain? °The best strategy for controlling your pain after surgery is around the clock pain control with Tylenol (acetaminophen) and Motrin (ibuprofen or Advil). Alternating these medications with each other allows you to maximize your pain control. In addition to Tylenol and Motrin, you can use heating pads or ice packs on your incisions to help reduce your pain. ° °How will I alternate your regular strength over-the-counter pain medication? °You will take a dose of pain medication every three hours. °Start by taking 650 mg of Tylenol (2 pills of 325 mg) °3 hours later take 600 mg of Motrin (3 pills of 200 mg) °3 hours after taking the Motrin take 650 mg of Tylenol °3 hours after that take 600 mg of Motrin. ° ° °- 1 - ° °See example - if your first dose of Tylenol is at 12:00 PM ° ° °12:00 PM Tylenol 650 mg (2 pills of 325 mg)  °3:00 PM Motrin 600 mg (3 pills of 200 mg)  °6:00 PM Tylenol 650 mg (2 pills of 325 mg)  °9:00 PM Motrin 600 mg (3 pills of 200 mg)  °Continue alternating every 3 hours  ° °We recommend that you follow this schedule around-the-clock for at least 3 days after surgery, or until you feel that it is no longer needed. Use the table on the last page of this handout to keep track of the medications you are taking. °Important: °Do not take  more than 3000mg of Tylenol or 3200mg of Motrin in a 24-hour period. °Do not take ibuprofen/Motrin if you have a history of bleeding stomach ulcers, severe kidney disease, &/or actively taking a blood thinner ° °What if I still have pain? °If you have pain that is not controlled with the over-the-counter pain medications (Tylenol and Motrin or Advil) you might have what we call “breakthrough” pain. You will receive a prescription for a small amount of an opioid pain medication such as Oxycodone, Tramadol, or Tylenol with Codeine. Use these opioid pills in the first 24 hours after surgery if you have breakthrough pain. Do not take more than 1 pill every 4-6 hours. ° °If you still have uncontrolled pain after using all opioid pills, don't hesitate to call our staff using the number provided. We will help make sure you are managing your pain in the best way possible, and if necessary, we can provide a prescription for additional pain medication. ° ° °Day 1   ° °Time  °Name of Medication Number of pills taken  °Amount of Acetaminophen  °Pain Level  ° °Comments  °AM PM       °AM PM       °AM PM       °AM PM       °AM PM       °  AM PM       °AM PM       °AM PM       °Total Daily amount of Acetaminophen °Do not take more than  3,000 mg per day    ° ° °Day 2   ° °Time  °Name of Medication Number of pills °taken  °Amount of Acetaminophen  °Pain Level  ° °Comments  °AM PM       °AM PM       °AM PM       °AM PM       °AM PM       °AM PM       °AM PM       °AM PM       °Total Daily amount of Acetaminophen °Do not take more than  3,000 mg per day    ° ° °Day 3   ° °Time  °Name of Medication Number of pills taken  °Amount of Acetaminophen  °Pain Level  ° °Comments  °AM PM       °AM PM       °AM PM       °AM PM       ° ° ° °AM PM       °AM PM       °AM PM       °AM PM       °Total Daily amount of Acetaminophen °Do not take more than  3,000 mg per day    ° ° °Day 4   ° °Time  °Name of Medication Number of pills taken  °Amount of  Acetaminophen  °Pain Level  ° °Comments  °AM PM       °AM PM       °AM PM       °AM PM       °AM PM       °AM PM       °AM PM       °AM PM       °Total Daily amount of Acetaminophen °Do not take more than  3,000 mg per day    ° ° °Day 5   ° °Time  °Name of Medication Number °of pills taken  °Amount of Acetaminophen  °Pain Level  ° °Comments  °AM PM       °AM PM       °AM PM       °AM PM       °AM PM       °AM PM       °AM PM       °AM PM       °Total Daily amount of Acetaminophen °Do not take more than  3,000 mg per day    ° ° ° °Day 6   ° °Time  °Name of Medication Number of pills °taken  °Amount of Acetaminophen  °Pain Level  °Comments  °AM PM       °AM PM       °AM PM       °AM PM       °AM PM       °AM PM       °AM PM       °AM PM       °Total Daily amount of Acetaminophen °Do not take more than  3,000 mg per day    ° ° °Day 7   ° °Time  °  Name of Medication Number of pills taken  °Amount of Acetaminophen  °Pain Level  ° °Comments  °AM PM       °AM PM       °AM PM       °AM PM       °AM PM       °AM PM       °AM PM       °AM PM       °Total Daily amount of Acetaminophen °Do not take more than  3,000 mg per day    ° ° ° ° °For additional information about how and where to safely dispose of unused opioid °medications - https://www.[employer]ful[employer].org ° °Disclaimer: This document contains information and/or instructional materials adapted from [facility] for the typical patient with your condition. It does not replace medical advice from your health care provider because your experience may differ from that of the °typical patient. Talk to your health care provider if you have any questions about this °document, your condition or your treatment plan. °Adapted from [facility] ° °

## 2021-05-28 ENCOUNTER — Telehealth: Payer: Self-pay

## 2021-05-28 NOTE — Telephone Encounter (Signed)
Transition Care Management Unsuccessful Follow-up Telephone Call  Date of discharge and from where:  05/27/2021 / [facility]  Attempts:  1st Attempt  Reason for unsuccessful TCM follow-up call:  Left voice message George Ina RN,BSN,CCM RN Case Manager [facility] [phone]

## 2021-05-28 NOTE — Telephone Encounter (Signed)
Transition Care Management Follow-up Telephone Call Date of discharge and from where: 05/27/2021 / [facility] How have you been since you were released from the hospital? " Doing pretty Good" Any questions or concerns? No  Items Reviewed: Did the pt receive and understand the discharge instructions provided? Yes  Medications obtained and verified? Yes  Other? No  Any new allergies since your discharge? No  Dietary orders reviewed? Yes Do you have support at home? Yes   Home Care and Equipment/Supplies: Were home health services ordered? no If so, what is the name of the agency? N/a  Has the agency set up a time to come to the patient's home? not applicable Were any new equipment or medical supplies ordered?  No What is the name of the medical supply agency? N/a Were you able to get the supplies/equipment? not applicable Do you have any questions related to the use of the equipment or supplies? No  Functional Questionnaire: (I = Independent and D = Dependent) ADLs: I  Bathing/Dressing- I  Meal Prep- I  Eating- I  Maintaining continence- I  Transferring/Ambulation- I  Managing Meds- I  Follow up appointments reviewed:  PCP Hospital f/u appt confirmed? No  Scheduled to see  Specialist Hospital f/u appt confirmed? Yes  Scheduled to see Dr. Melrose Nakayama on 06/27/2021 @ 1:00 pm. Are transportation arrangements needed? Yes  If their condition worsens, is the pt aware to call PCP or go to the Emergency Dept.? Yes Was the patient provided with contact information for the PCP's office or ED? Yes Was to pt encouraged to call back with questions or concerns? Yes  George Ina RN,BSN,CCM RN Case Manager [facility] [phone]

## 2021-05-29 NOTE — Discharge Summary (Addendum)
Physician Discharge Summary   Patient name: Ricky [name]  Admit date:     05/23/2021  Discharge date: 05/27/2021   Attending Physician: CCS, MD [[id number]]  Discharge Physician: Lynden Oxford   PCP: Maryagnes Amos, FNP    Follow-up Information     Maryagnes Amos, FNP. Schedule an appointment as soon as possible for a visit in 1 week(s).   Specialties: Nurse Practitioner, Psychiatry Contact information: [address] [place] [address] [phone]         Surgery, [place]. Go on 06/25/2021.   Specialty: General Surgery Why: at 11:00 AM for follow up with your surgeon. please arrive 30 minutes early. Contact information: [address] [place] [address] [phone]                 Recommendations at discharge: continue follow up as recommended   Discharge Diagnoses Principal Problem:   Small bowel obstruction (HCC) Active Problems:   Essential hypertension   SBO (small bowel obstruction) (HCC)   Mixed hyperlipidemia   Hypokalemia due to excessive gastrointestinal loss of potassium   Resolved Diagnoses Resolved Problems:   * No resolved hospital problems. [facility] Course    * Small bowel obstruction [facility]) Patient presenting with several days of abdominal pain nausea and vomiting CT imaging revealing evidence of dilated central bowel loops with proximal and distal transition points with some swirling of the central mesentery concerning for closed-loop obstruction secondary to internal hernia General surgery Patient treated with laparoscopic resection of small bowel. Diet advanced and pail well controlled. Had a BM.   Hypokalemia due to excessive gastrointestinal loss of potassium Replaced    Essential hypertension Resume patients home regimen or oral antihypertensives    Mixed hyperlipidemia Continuing home regimen of lipid lowering therapy.      Procedures performed: none    Condition at discharge: good  Exam General: Appear in mild distress, no Rash; Oral Mucosa Clear, moist. no Abnormal Neck Mass Or lumps, Conjunctiva normal  Cardiovascular: S1 and S2 Present, no Murmur, Respiratory: good respiratory effort, Bilateral Air entry present and CTA, no Crackles, no wheezes Abdomen: Bowel Sound present, Soft and no tenderness Extremities: no Pedal edema Neurology: alert and oriented to time, place, and person affect appropriate. no new focal deficit Gait not checked due to patient safety concerns   Disposition: Home  Discharge time: greater than 30 minutes. Allergies as of 05/27/2021   No Known Allergies      Medication List     TAKE these medications    acetaminophen 325 MG tablet Commonly known as: Tylenol Take 2 tablets (650 mg total) by mouth every 4 (four) hours as needed for up to 5 days for mild pain or moderate pain.   Azilsartan-Chlorthalidone 40-25 MG Tabs Take 1 tablet by mouth daily.   Blood Pressure Cuff Misc XL Adult blood pressure cuff   carvedilol 25 MG tablet Commonly known as: COREG Take 25 mg by mouth 2 (two) times daily with a meal.   ibuprofen 200 MG tablet Commonly known as: ADVIL Take 400 mg by mouth every 6 (six) hours as needed for fever, headache or mild pain.   Insulin Pen Needle 32G X 4 MM Misc Use to inject Ozempic once weekly   polyethylene glycol 17 g packet Commonly known as: MIRALAX / GLYCOLAX Take 17 g by mouth daily.   potassium chloride SA 20 MEQ tablet Commonly known as: KLOR-CON Take 2 tablets (  40 mEq total) by mouth daily. What changed: how much to take   rosuvastatin 10 MG tablet Commonly known as: CRESTOR TAKE 1 TABLET(10 MG) BY MOUTH DAILY What changed: See the new instructions.   sildenafil 100 MG tablet Commonly known as: VIAGRA TAKE 1/2 TO 1 TABLET BY MOUTH AS NEEDED. Defer further refills to PCP. What changed:  how much to take how to take this when to take this reasons to  take this additional instructions   traMADol 50 MG tablet Commonly known as: Ultram Take 1 tablet (50 mg total) by mouth every 6 (six) hours as needed for up to 5 days for moderate pain or severe pain.               Discharge Care Instructions  (From admission, onward)           Start     Ordered   05/27/21 0000  Leave dressing on - Keep it clean, dry, and intact until clinic visit        05/27/21 1301            CT ABDOMEN PELVIS W CONTRAST  Result Date: 05/23/2021 CLINICAL DATA:  Bowel obstruction suspected. EXAM: CT ABDOMEN AND PELVIS WITH CONTRAST TECHNIQUE: Multidetector CT imaging of the abdomen and pelvis was performed using the standard protocol following bolus administration of intravenous contrast. CONTRAST:  OMNIPAQUE IOHEXOL 300 MG/ML  SOLN COMPARISON:  CT abdomen and pelvis 05/06/2020. FINDINGS: Lower chest: No acute abnormality. Hepatobiliary: No focal liver abnormality is seen. No gallstones, gallbladder wall thickening, or biliary dilatation. Pancreas: Unremarkable. No pancreatic ductal dilatation or surrounding inflammatory changes. Spleen: Normal in size without focal abnormality. Adrenals/Urinary Tract: Adrenal glands are unremarkable. Kidneys are normal, without renal calculi, focal lesion, or hydronephrosis. Bladder is unremarkable. Stomach/Bowel: There are dilated small bowel loops measuring up to 5.6 cm. Transition point is visualized visualized in the central abdomen image 3/34. Transition point is also visualized in the central abdomen image 3/51. There is some swirling of central mesentery just inferior to this level. Small bowel loops proximal and distal to this level appear decompressed. Stomach is nondilated. Colon is nondilated. The appendix is not definitely visualized. No pneumatosis or free air. No significant mesenteric edema. Vascular/Lymphatic: No significant vascular findings are present. No enlarged abdominal or pelvic lymph nodes.  Reproductive: Prostate is unremarkable. Other: No abdominal wall hernia or abnormality. No abdominopelvic ascites. Musculoskeletal: L4-L5 lumbar fusion hardware is present. IMPRESSION: 1. Dilated central small bowel loops. Proximal and distal transition points are present with some swirling of the central mesentery. Findings may be related to closed loop obstruction secondary to internal hernia. Surgical consultation recommended. No pneumatosis or significant mesenteric edema. Electronically Signed   By: Darliss Cheney M.D.   On: 05/23/2021 17:59   CT ABDOMEN PELVIS W CONTRAST  Result Date: 05/06/2021 CLINICAL DATA:  Bowel obstruction suspected. EXAM: CT ABDOMEN AND PELVIS WITH CONTRAST TECHNIQUE: Multidetector CT imaging of the abdomen and pelvis was performed using the standard protocol following bolus administration of intravenous contrast. CONTRAST:  67mL OMNIPAQUE IOHEXOL 350 MG/ML SOLN COMPARISON:  CT Abdomen Pelvis, 04/30/2021 and 03/27/2021. FINDINGS: Lower chest: No acute abnormality. Hepatobiliary: No focal liver abnormality is seen. No gallstones, gallbladder wall thickening, or biliary dilatation. Pancreas: Unremarkable. No pancreatic ductal dilatation or surrounding inflammatory changes. Spleen: Normal in size without focal abnormality. Adrenals/Urinary Tract: Adrenal glands are unremarkable. Kidneys are normal, without renal calculi, focal lesion, or hydronephrosis. Bladder is unremarkable. Stomach/Bowel: Small hiatus hernia.  Stomach is decompressed. Persistence of air and-fluid distention of multiple small bowel loops, with focal transition within the RIGHT lower quadrant (# 2, 65/# 6, 69). No evidence of perforation, or overt vascular compromise. The appendix is not well visualized. Nondilated colon. Mild colonic diverticulosis. Vascular/Lymphatic: No significant vascular findings are present. No enlarged abdominal or pelvic lymph nodes. Reproductive: Prostate is unremarkable. Other: Umbilical  herniorrhaphy. No abdominal wall hernia or abnormality. No abdominopelvic ascites. Musculoskeletal: Small RIGHT anterior abdominal wall contusions, likely injection sites. No acute or significant osseous findings. Degenerative changes of the spine, including L4-L5 facet arthropathy and posterior fusion. IMPRESSION: Persistent small bowel obstruction with discrete transition within the RIGHT lower quadrant (see key images). No evidence of perforation or overt vascular compromise. Electronically Signed   By: Roanna Banning M.D.   On: 05/06/2021 16:37   DG Abd 2 Views  Result Date: 05/06/2021 CLINICAL DATA:  Abdominal pain, bloating. EXAM: ABDOMEN - 2 VIEW COMPARISON:  None. FINDINGS: Dilated small bowel loops are noted with air-fluid levels concerning for distal small bowel obstruction. No colonic dilatation is noted. There is no evidence of free air. No radio-opaque calculi or other significant radiographic abnormality is seen. IMPRESSION: Dilated small bowel loops are noted with air-fluid levels concerning for distal small bowel obstruction. Electronically Signed   By: Lupita Raider M.D.   On: 05/06/2021 11:16   DG ABD ACUTE 2+V W 1V CHEST  Result Date: 05/07/2021 CLINICAL DATA:  Small-bowel obstruction, abdominal pain EXAM: DG ABDOMEN ACUTE WITH 1 VIEW CHEST COMPARISON:  05/06/2021 FINDINGS: Improvement in gas distended loops of small bowel in the central abdomen, now measuring up to 4.8 cm, previously as large as 7.5 cm. There is scattered gas and stool present throughout the colon to the rectum. No free air in the abdomen. Mild cardiomegaly.  No acute abnormality of the lungs. IMPRESSION: 1. Improvement in gas distended loops of small bowel in the central abdomen, consistent with improved obstruction or ileus. There is scattered gas and stool present throughout the colon to the rectum. 2.  Cardiomegaly without acute abnormality of the lungs. Electronically Signed   By: Lauralyn Primes M.D.   On: 05/07/2021  10:21   DG Abd Portable 1V  Result Date: 05/09/2021 CLINICAL DATA:  Small bowel obstruction EXAM: PORTABLE ABDOMEN - 1 VIEW COMPARISON:  05/08/2021 FINDINGS: Continued interval improvement of gas-filled, although not overtly distended loops of small bowel in the central abdomen, measuring up to 2.6 cm in caliber. Gas and stool is present to the rectum. No free air in the abdomen on supine radiographs. IMPRESSION: Continued interval improvement of gas-filled, although not overtly distended loops of small bowel in the central abdomen, measuring up to 2.6 cm in caliber. Gas and stool is present to the rectum. Electronically Signed   By: Lauralyn Primes M.D.   On: 05/09/2021 11:36   DG Abd Portable 1V  Result Date: 05/08/2021 CLINICAL DATA:  Small bowel obstruction EXAM: PORTABLE ABDOMEN - 1 VIEW COMPARISON:  05/07/2021 FINDINGS: Interval decrease in gas distention of multiple loops of small bowel in the central abdomen, now measuring no greater than 3.0 cm. There is gas and stool present in the colon to the rectum. No obvious free air on single supine radiograph. IMPRESSION: Interval decrease in gas distention of multiple loops of small bowel in the central abdomen, now measuring no greater than 3.0 cm. There is gas and stool present in the colon to the rectum. Electronically Signed   By: Trinna Post  Jayme Cloud M.D.   On: 05/08/2021 09:44   Results for orders placed or performed during the hospital encounter of 05/23/21  Resp Panel by RT-PCR (Flu A&B, Covid) Nasopharyngeal Swab     Status: None   Collection Time: 05/23/21  9:23 PM   Specimen: Nasopharyngeal Swab; Nasopharyngeal(NP) swabs in vial transport medium  Result Value Ref Range Status   SARS Coronavirus 2 by RT PCR NEGATIVE NEGATIVE Final    Comment: (NOTE) SARS-CoV-2 target nucleic acids are NOT DETECTED.  The SARS-CoV-2 RNA is generally detectable in upper respiratory specimens during the acute phase of infection. The lowest concentration of SARS-CoV-2  viral copies this assay can detect is 138 copies/mL. A negative result does not preclude SARS-Cov-2 infection and should not be used as the sole basis for treatment or other patient management decisions. A negative result may occur with  improper specimen collection/handling, submission of specimen other than nasopharyngeal swab, presence of viral mutation(s) within the areas targeted by this assay, and inadequate number of viral copies(<138 copies/mL). A negative result must be combined with clinical observations, patient history, and epidemiological information. The expected result is Negative.  Fact Sheet for Patients:  BloggerCourse.com  Fact Sheet for Healthcare Providers:  SeriousBroker.it  This test is no t yet approved or cleared by the [place] FDA and  has been authorized for detection and/or diagnosis of SARS-CoV-2 by FDA under an Emergency Use Authorization (EUA). This EUA will remain  in effect (meaning this test can be used) for the duration of the COVID-19 declaration under Section 564(b)(1) of the Act, 21 U.S.C.section 360bbb-3(b)(1), unless the authorization is terminated  or revoked sooner.       Influenza A by PCR NEGATIVE NEGATIVE Final   Influenza B by PCR NEGATIVE NEGATIVE Final    Comment: (NOTE) The Xpert Xpress SARS-CoV-2/FLU/RSV plus assay is intended as an aid in the diagnosis of influenza from Nasopharyngeal swab specimens and should not be used as a sole basis for treatment. Nasal washings and aspirates are unacceptable for Xpert Xpress SARS-CoV-2/FLU/RSV testing.  Fact Sheet for Patients: BloggerCourse.com  Fact Sheet for Healthcare Providers: SeriousBroker.it  This test is not yet approved or cleared by the [place] FDA and has been authorized for detection and/or diagnosis of SARS-CoV-2 by FDA under an Emergency Use Authorization  (EUA). This EUA will remain in effect (meaning this test can be used) for the duration of the COVID-19 declaration under Section 564(b)(1) of the Act, 21 U.S.C. section 360bbb-3(b)(1), unless the authorization is terminated or revoked.  Performed at [facility], [address], [place], [place] [address]     Signed:  Lynden Oxford MD.  [facility] 05/29/2021, 7:07 AM

## 2021-06-01 ENCOUNTER — Other Ambulatory Visit: Payer: Self-pay | Admitting: Cardiology

## 2021-06-11 ENCOUNTER — Other Ambulatory Visit: Payer: Self-pay

## 2021-06-12 ENCOUNTER — Ambulatory Visit: Payer: Medicare Other | Admitting: Surgery

## 2021-06-12 ENCOUNTER — Other Ambulatory Visit: Payer: Self-pay

## 2021-06-12 ENCOUNTER — Encounter: Payer: Self-pay | Admitting: Physician Assistant

## 2021-06-12 ENCOUNTER — Ambulatory Visit: Payer: Medicare Other | Admitting: Physician Assistant

## 2021-06-12 VITALS — BP 129/94 | HR 65 | Resp 20 | Ht 73.0 in | Wt 272.0 lb

## 2021-06-12 DIAGNOSIS — I712 Thoracic aortic aneurysm, without rupture, unspecified: Secondary | ICD-10-CM | POA: Diagnosis not present

## 2021-06-12 NOTE — Patient Instructions (Signed)
High Magnesium foods.   Pumpkin seed - kernels: Serving Size 1 oz, 168 mg Almonds, dry roasted: Serving Size 1 oz, 80 mg Spinach, boiled: Serving Size  cup, 78 mg Cashews, dry roasted: Serving Size 1 oz, 74 mg Pumpkin seeds in shell: Serving Size 1 oz, 74 mg Peanuts, oil roasted: Serving Size  cup, 63 mg Cereal, shredded wheat: Serving Size 2 large biscuits, 61 mg Soymilk, plain or vanilla: Serving Size 1 cup, 61 mg Black beans, cooked: Serving Size  cup, 60 mg Edamame, shelled, cooked: Serving Size  cup, 50 mg Dark chocolate -60-69% cacoa: Serving Size 1 oz, 50 mg Peanut butter, smooth: Serving Size 2 tablespoons, 49 mg Bread, whole wheat: Serving Size 2 slices, 46 mg Avocado, cubed: Serving Size 1 cup, 44 mg Potato, baked with skin: Serving Size 3.5 oz, 43 mg Rice, brown, cooked: Serving Size  cup, 42 mg Yogurt, plain, low fat: Serving Size 8 oz, 42 mg Breakfast cereals fortified: Serving Size 10% fortification, 40 mg Oatmeal, instant: Serving Size 1 packet, 36 mg Kidney beans, canned: Serving Size  cup, 35 mg Banana: Serving Size 1 medium, 32 mg Cocoa powder- unsweetened: Serving Size 1 tablespoon, 27 mg Salmon, Atlantic, farmed: Serving Size 3 oz, 26 mg Milk: Serving Size 1 cup, 24-27 mg Halibut, cooked: Serving Size 3 oz, 24 mg Raisins: Serving Size  cup, 23 mg Chicken breast, roasted: Serving Size 3 oz, 22 mg Beef, ground, 90% lean: Serving Size 3 oz, 20 mg Broccoli, chopped & cooked: Serving Size  cup, 12 mg Rice, white, cooked: Serving Size  cup, 10 mg Apple: Serving Size 1 medium, 9 mg Carrot, raw: Serving Size 1 medium, 7 mg

## 2021-06-12 NOTE — Progress Notes (Signed)
[address]       [place] [address]             [phone]       50 year old gentleman returns for routine follow-up of sub-5 cm ascending aortic aneurysm.  Since his last visit he has had good blood pressure control and has had no complaints of chest pain.  He has had a couple of episodes of fluttering in the chest but these were short-lived and limited.   Today he presents to the office for his 1 year routine surveillance of his ascending aortic aneurysm.  He has had no significant chest pain or shortness of breath.  I congratulated him on his 20+ pound weight loss.  He has been trying to eat a heart healthy diet and drinking lots of water throughout the day.  He has been compliant on his blood pressure medications and per the patient his blood pressures been well controlled at home.  Current Outpatient Medications on File Prior to Visit  Medication Sig Dispense Refill   Azilsartan-Chlorthalidone 40-25 MG TABS Take 1 tablet by mouth daily. 90 tablet 3   Blood Pressure Monitoring (BLOOD PRESSURE CUFF) MISC XL Adult blood pressure cuff 1 each 0   carvedilol (COREG) 25 MG tablet Take 25 mg by mouth 2 (two) times daily with a meal.     ibuprofen (ADVIL) 200 MG tablet Take 400 mg by mouth every 6 (six) hours as needed for fever, headache or mild pain.     Insulin Pen Needle 32G X 4 MM MISC Use to inject Ozempic once weekly 100 each 1   polyethylene glycol (MIRALAX / GLYCOLAX) 17 g packet Take 17 g by mouth daily. 14 each 0   potassium chloride SA (KLOR-CON) 20 MEQ tablet Take 2 tablets (40 mEq total) by mouth daily. (Patient taking differently: Take 20 mEq by mouth daily.) 90 tablet 3   rosuvastatin (CRESTOR) 10 MG tablet TAKE 1 TABLET(10 MG) BY MOUTH DAILY (Patient taking differently: Take 10 mg by mouth daily.) 90 tablet 3   sildenafil (VIAGRA) 100 MG tablet TAKE 1/2 TO 1 TABLET BY MOUTH AS NEEDED. Defer further refills to PCP. (Patient taking differently: Take 100 mg  by mouth daily as needed for erectile dysfunction.) 10 tablet 0   No current facility-administered medications on file prior to visit.      Physical exam: Vitals:   06/12/21 1302  BP: (!) 129/94  Pulse: 65  Resp: 20  SpO2: 95%    Narrative & Impression  CLINICAL DATA:  PVC, Abnormal echo R/O Sarcoid   Dilated Aortic Root   EXAM: CARDIAC MRI   TECHNIQUE: The patient was scanned on a 1.5 Tesla Siemens magnet. A dedicated cardiac coil was used. Functional imaging was done using Fiesta sequences. 2,3, and 4 chamber views were done to assess for RWMA's. Modified Simpson's rule using a short axis stack was used to calculate an ejection fraction on a dedicated work [employer]. The patient received 10 cc of Gadavist. After 10 minutes inversion recovery sequences were used to assess for infiltration and scar tissue.   CONTRAST:  Gadavist   FINDINGS: Normal atrial sizes. No ASD/PFO. No VSD. Moderate to severely dilated aortic root 4.7 cm compared to descending thoracic aorta 2.5 cm. Normal arch vessels and no coarctation. No pericardial effusion   Normal TV, MV, PV Tri leaflet AV with mild appearing central AR. Normal coronary artery origins Mild LVE with low normal  EF 53% (EDV 243 cc ESV 115 cc SV 128 cc) Normal RV size and function RVEF 49% (EDV 227 cc ESV 115 cc SV 112 cc) Delayed gadolinium images with no infiltration, infarct or scar and no evidence of sarcoid Parametric measures normal ECV 42 msec, T1 global 1003 msec and T2 49 msec   IMPRESSION: 1. Low normal LV size and function EF 53% no RWMA;s   2. No gadolinium uptake on delayed inversion recovery sequences no evidence of Sarcoid   3. Moderate to severely dilated aortic root 4.7 cm Normal arch vessels and no coarctation   4.  Tri leaflet AV with mild appearing AR   5.  Normal RV size and function RVEF 49%   6.  Normal parametric measures see values above   Charlton Haws      Electronically Signed   By: Charlton Haws M.D.   On: 02/25/2021 16:09       Well-appearing man in no acute distress Neurologically intact No JVD or carotid bruit Clear to auscultation bilaterally Regular rate and rhythm, no murmur No peripheral edema    Assessment:  Mr. Riemenschneider is a 51 year old male and is doing well today during my interview.  Per the patient his blood pressure has been well controlled.  He has remained physically active and has watch his diet closely.  He has lost over 20 pounds since his last follow-up appointment.  He has had a lot of issues this past year with bowel obstruction.  He did end up having to have a portion of his bowel resected and reconnected.  He was on a liquid diet and then a soft food diet for a while which aided in his weight loss.  He has not had any significant chest pain or shortness of breath. He has been using Mrs. Dash seasoning on all of his food.  I emphasized blood pressure control and low-sodium diet.  Stable 4.7 cm ascending aortic aneurysm.     Plan: Patient would prefer to have a CTA for aortic aneurysmal dilatation surveillance.  We will plan to do this in 1 year.  If he has any new issues arise before his next appointment he is welcome to call our office.   Jari Favre, PA-C [phone]

## 2021-10-07 NOTE — Progress Notes (Signed)
Cardiology Office Note:    Date:  10/10/2021   ID:  Ricky [name], DOB [birth year], MRN [id number]  PCP:  Suella Broad, FNP  Cardiologist:  Donato Heinz, MD  Electrophysiologist:  None   Referring MD: Suella Broad   No chief complaint on file.    History of Present Illness:    Ricky [name] is a 51 y.o. male with a hx of frequent PVCs, thoracic aortic aneurysm, OSA hypertension, thrombocytopenia who presents for follow-up.  He was initially seen on 07/22/2019 after he was referred by Sheran Fava, FNP for an evaluation of PVCs.  Had an ED visit on 07/10/2019 with chest pain/body aches and lightheadedness.  EKG showed bigeminy. TTE was done on 07/28/2019 which showed low normal systolic function (EF 50 to 55%), septal hypokinesis.  Also was notable for ascending aortic aneurysm measuring up to 48 mm.  Underwent CTA chest on 08/16/2019, which showed ascending aortic aneurysm measuring up to 13mm.  Cardiac monitor showed frequent PVCs (23% of beats) and 120 episodes of SVT, lasting up to 1 minute.  Lexiscan Myoview on 08/18/2019 showed no evidence of ischemia.  Diagnosed with OSA.  He was referred to Dr. Lovena Le in EP for evaluation of his PVCs, recommended uptitrating his beta-blocker as initial treatment and repeat monitor once on Coreg 25 mg twice daily.  Repeat monitor in 11/11/2019 showed significant improvement in PVC burden (5.3% of beats).  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  Cardiac MRI on 02/25/2021 showed no LGE, EF 53%, dilated aortic root measuring 47 mm, normal RV function.  Since last clinic visit, he reports that he has been doing well.  He has lost 35 pounds.  He underwent surgery in October for bowel obstruction.  Reports has recovered well.  Reports BP has been well controlled at home, usually 110s to 120s.  Did report had one morning where his BP was down to 80s but he  was asymptomatic.  He denies any chest pain, dyspnea, lower extremity edema, or palpitations.  Reports occasional lightheadedness but denies any syncope.   BP Readings from Last 3 Encounters:  10/10/21 120/80  06/12/21 (!) 129/94  05/27/21 (!) 143/103    Wt Readings from Last 3 Encounters:  10/10/21 270 lb 3.2 oz (122.6 kg)  06/12/21 272 lb (123.4 kg)  05/23/21 273 lb 5.9 oz (124 kg)      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   Eval Dr Glenna Fellows, Neurosurgery 10/13   Chest pain    Depression    High cholesterol    Hypertension    Osteoarthritis    Osteoarthritis    Overweight    SOB (shortness of breath)    Thrombocytopenia ([place]) 07/07/2012   138,000 08/26/11; 98,000 06/29/12    Past Surgical History:  Procedure Laterality Date   ADENOIDECTOMY     ANTERIOR FUSION CERVICAL SPINE     APPENDECTOMY     BOWEL RESECTION N/A 05/24/2021   Procedure: SMALL BOWEL RESECTION;  Surgeon: Jovita Kussmaul, MD;  Location: [facility];  Service: General;  Laterality: N/A;   HERNIA REPAIR     IRRIGATION AND DEBRIDEMENT SEBACEOUS CYST     on scalp   KNEE ARTHROSCOPY     x2   LAPAROSCOPY N/A 05/24/2021   Procedure: LAPAROSCOPY DIAGNOSTIC;  Surgeon: Jovita Kussmaul, MD;  Location:  [facility];  Service: General;  Laterality: N/A;   LAPAROTOMY N/A 05/24/2021   Procedure: EXPLORATORY LAPAROTOMY;  Surgeon: Jovita Kussmaul, MD;  Location: [facility];  Service: General;  Laterality: N/A;   LUMBAR FUSION     POSTERIOR FUSION CERVICAL SPINE     TONSILLECTOMY      Current Medications: Current Meds  Medication Sig   Azilsartan-Chlorthalidone 40-25 MG TABS Take 1 tablet by mouth daily.   Blood Pressure Monitoring (BLOOD PRESSURE CUFF) MISC XL Adult blood pressure cuff   carvedilol (COREG) 25 MG tablet Take 25 mg by mouth 2 (two) times daily with a meal.   ibuprofen (ADVIL) 200 MG tablet Take 400 mg by mouth every 6 (six) hours as needed for fever,  headache or mild pain.   LORazepam (ATIVAN) 0.5 MG tablet Take one tablet prior to the test   potassium chloride SA (KLOR-CON) 20 MEQ tablet Take 2 tablets (40 mEq total) by mouth daily. (Patient taking differently: Take 20 mEq by mouth daily.)   rosuvastatin (CRESTOR) 10 MG tablet TAKE 1 TABLET(10 MG) BY MOUTH DAILY (Patient taking differently: Take 10 mg by mouth daily.)     Allergies:   Patient has no known allergies.   Social History   Socioeconomic History   Marital status: Single    Spouse name: Not on file   Number of children: Not on file   Years of education: Not on file   Highest education level: Not on file  Occupational History   Occupation: Disabled  Tobacco Use   Smoking status: Former    Packs/day: 0.50    Years: 6.00    Pack years: 3.00    Types: Cigarettes    Quit date: 2013    Years since quitting: 10.1   Smokeless tobacco: Never  Substance and Sexual Activity   Alcohol use: No   Drug use: No   Sexual activity: Not on file  Other Topics Concern   Not on file  Social History Narrative   Not on file   Social Determinants of Health   Financial Resource Strain: Not on file  Food Insecurity: Not on file  Transportation Needs: Not on file  Physical Activity: Not on file  Stress: Not on file  Social Connections: Not on file     Family History: No family history of heart disease  ROS:   Please see the history of present illness.    All other systems reviewed and are negative.  EKGs/Labs/Other Studies Reviewed:    The following studies were reviewed today:  EKG:   04/04/21: NSR, rate 50, no PVCs 01/02/2021- The ekg ordered today demonstrates normal sinus rhythm, rate 62, no PVC's  10/01/2020- The ekg ordered demonstrates sinus rhythm, rate 93, PVCs  Recent Labs: 05/23/2021: ALT 23; Hemoglobin 14.0; Platelets 130 05/27/2021: BUN <5; Creatinine, Ser 0.74; Magnesium 1.9; Potassium 3.8; Sodium 137  Recent Lipid Panel    Component Value Date/Time    CHOL 126 06/05/2020 1022   TRIG 146 06/05/2020 1022   HDL 35 (L) 06/05/2020 1022   CHOLHDL 3.6 06/05/2020 1022   LDLCALC 65 06/05/2020 1022    Physical Exam:    VS:  BP 120/80    Pulse 70    Ht 6\' 1"  (1.854 m)    Wt 270 lb 3.2 oz (122.6 kg)    SpO2 96%    BMI 35.65 kg/m     Wt Readings from Last 3 Encounters:  10/10/21 270 lb 3.2 oz (122.6 kg)  06/12/21 272 lb (123.4 kg)  05/23/21 273 lb 5.9 oz (124 kg)     GEN:  Well nourished, well developed in no acute distress HEENT: Normal NECK: No JVD CARDIAC: RRR, no murmurs, rubs, gallops RESPIRATORY:  Clear to auscultation without rales, wheezing or rhonchi  ABDOMEN: Soft, non-tender, non-distended MUSCULOSKELETAL:  No edema; No deformity  SKIN: Warm and dry NEUROLOGIC:  Alert and oriented x 3 PSYCHIATRIC:  Normal affect    Cardiac monitor 08/21/19: Frequent ventricular ectopy (23% of beats) 120 episodes of SVT, longest lasting 59 seconds at 152 bpm One 4 beat episode of NSVT Patient triggered events corresponded to sinus rhythm with ventricular ectopy, incluging bigeminy   4 days of data recorded on Zio monitor. Patient had a min HR of 46 bpm, max HR of 207 bpm, and avg HR of 77 bpm. Predominant underlying rhythm was Sinus Rhythm. No atrial fibrillation, high degree block, or pauses noted. There was one 4 beat run of NSVT and 120 runs of SVT, longest lasting 59 seconds at 152 bpm.  Isolated atrial was ectopy was rare (<1%).  Very frequent ventricular ectopy (23% of beats).  Episodes of bigeminy (longest lasting 132 seconds) and trigeminy (longest lasting 98 seconds).  There were 2 triggered events, corresponding to sinus rhythm with ventricular bigeminy.   No significant arrhythmias detected.  CTA chest 08/16/19: Aneurysmal dilatation of the ascending thoracic aorta up to 47 mm. Recommend semi-annual imaging followup by CTA or MRA and referral to cardiothoracic surgery if not already obtained. This recommendation follows 2010  ACCF/AHA/AATS/ACR/ASA/SCA/SCAI/SIR/STS/SVM Guidelines for the Diagnosis and Management of Patients With Thoracic Aortic Disease. Circulation. 2010; [id number]. Aortic aneurysm NOS (ICD10-I71.9)   Lexiscan Myoview 08/18/2019: The study is normal. This is a low risk study.   Normal pharmacologic nuclear stress test with no evidence for prior infarct or ischemia. LVEF not calculated. Frequent PVCs.  TTE 07/28/19:  1. Left ventricular ejection fraction, by visual estimation, is 50 to 55%. The left ventricle has low normal function. There is mildly increased left ventricular hypertrophy.  2. Mildly dilated left ventricular internal cavity size.  3. The left ventricle demonstrates regional wall motion abnormalities. Septal hypokinesis  4. Global right ventricle has normal systolic function.The right ventricular size is normal. No increase in right ventricular wall thickness.  5. Left atrial size was normal.  6. Right atrial size was normal.  7. The mitral valve is normal in structure. No evidence of mitral valve regurgitation.  8. The tricuspid valve is normal in structure. Tricuspid valve regurgitation is not demonstrated.  9. The aortic valve is tricuspid. Aortic valve regurgitation is not visualized. No evidence of aortic valve sclerosis or stenosis. 10. The inferior vena cava is normal in size with greater than 50% respiratory variability, suggesting right atrial pressure of 3 mmHg. 11. Aneurysm of the ascending aorta, measuring 48 mm.  ASSESSMENT:    1. Essential hypertension   2. Frequent PVCs   3. Aneurysm of ascending aorta without rupture   4. CAD in native artery   5. Hyperlipidemia, unspecified hyperlipidemia type      PLAN:    Hypertension: Previously was on amlodipine 5 mg daily, carvedilol 25 mg twice daily, spironolactone 25 mg daily, and azilsartan-chlorthalidone 40-25 mg.  Given aortic aneurysm, recommend goal SBP less than 120.  Has improved significantly since  starting CPAP and with weight loss, have been able to cut back on regimen.   -Currently on azilsartan-chlorthalidone 40-25 mg daily, carvedilol 25 mg BID.  Appears  controlled. Will check BMET/magnesium  PVCs:  frequent PVCs (23% of beats).  Symptomatic episodes on monitor appear to correspond to episodes of bigeminy.   TTE shows low normal systolic function (EF [id number]), septal hypokinesis.  No evidence of ischemia on Myoview.  Seen by EP, Dr Lovena Le recommended titrating up coreg, with repeat monitor in 11/11/2019 showed significant improvement in PVC burden (5.3% of beats).  No evidence of sarcoidosis on cardiac MRI. -Significantly improved with carvedilol, will continue carvedilol 25 mg twice daily  Aortic aneurysm: 4.7 cm ascending aortic aneurysm on CTA chest 12/18.  Repeat CTA chest 12/09/19 showed stable aneurysm, measured 4.5 cm.  Follows with Dr. Orvan Seen in cardiothoracic surgery, 33-month follow-up CT on 06/11/2020 showed stable aneurysm at 4.7 cm.  -Check MRA chest for surveillance of aortic aneurysm  CAD: Reports atypical chest pain.  Given frequent PVCs and hypokinesis seen on TTE, Lexiscan Myoview was done on 08/18/19, which showed no evidence of ischemia.  Coronary CTA on 12/08/2019 showed minimal nonobstructive CAD in mid LAD, calcium score 19 (84th percentile). -Continue rosuvastatin 10 mg daily.  LDL at goal less than 70  Hyperlipidemia: LDL 113 on 01/31/2020.  Started rosuvastatin 10 mg daily, LDL 65 on 06/05/2020.  We will recheck lipid panel  OSA: Encouraged compliance with CPAP  T2DM: On glipizide, A1c 8.7% on 05/07/2021   RTC in 6 months    Medication Adjustments/Labs and Tests Ordered: Current medicines are reviewed at length with the patient today.  Concerns regarding medicines are outlined above.  Orders Placed This Encounter  Procedures   MR ANGIO CHEST W WO CONTRAST   Basic metabolic panel   Magnesium   Lipid panel    Meds ordered this encounter  Medications    LORazepam (ATIVAN) 0.5 MG tablet    Sig: Take one tablet prior to the test    Dispense:  1 tablet    Refill:  0     Patient Instructions  Medication Instructions:  No changes *If you need a refill on your cardiac medications before your next appointment, please call your pharmacy*   Lab Work: Your provider would like for you to return in a few days to have the following labs drawn: fasting Lipid, Magnesium, and BMET. You do not need an appointment for the lab. Once in our office lobby there is a podium where you can sign in and ring the doorbell to alert [place] that you are here. The lab is open from 8:00 am to 4:30 pm; closed for lunch from 12:45pm-1:45pm.  If you have labs (blood work) drawn today and your tests are completely normal, you will receive your results only by: [place] (if you have MyChart) OR A paper copy in the mail If you have any lab test that is abnormal or we need to change your treatment, we will call you to review the results.   Testing/Procedures: Dr. Gardiner Rhyme would like for you to have a MRA of the Chest. They will call you to set this up. This will take place at [facility].   Follow-Up: At [facility], you and your health needs are our priority.  As part of our continuing mission to provide you with exceptional heart care, we have created designated Provider Care Teams.  These Care Teams include your primary Cardiologist (physician) and Advanced Practice Providers (APPs -  Physician Assistants and Nurse Practitioners) who all work together to provide you with the care you need, when you need it.  We recommend signing up  for the patient portal called "MyChart".  Sign up information is provided on this After Visit Summary.  MyChart is used to connect with patients for Virtual Visits (Telemedicine).  Patients are able to view lab/test results, encounter notes, upcoming appointments, etc.  Non-urgent messages can be sent to your provider as well.   To learn  more about what you can do with MyChart, go to [email or web address].    Your next appointment:   6 month(s)  The format for your next appointment:   In Person  Provider:   Donato Heinz, MD {      Signed, Donato Heinz, MD  10/10/2021 4:49 PM    [place]

## 2021-10-10 ENCOUNTER — Other Ambulatory Visit: Payer: Self-pay

## 2021-10-10 ENCOUNTER — Ambulatory Visit: Payer: Medicare Other | Admitting: Cardiology

## 2021-10-10 ENCOUNTER — Encounter: Payer: Self-pay | Admitting: Cardiology

## 2021-10-10 VITALS — BP 120/80 | HR 70 | Ht 73.0 in | Wt 270.2 lb

## 2021-10-10 DIAGNOSIS — I10 Essential (primary) hypertension: Secondary | ICD-10-CM | POA: Diagnosis not present

## 2021-10-10 DIAGNOSIS — I7121 Aneurysm of the ascending aorta, without rupture: Secondary | ICD-10-CM

## 2021-10-10 DIAGNOSIS — I2510 Atherosclerotic heart disease of native coronary artery without angina pectoris: Secondary | ICD-10-CM | POA: Diagnosis not present

## 2021-10-10 DIAGNOSIS — I493 Ventricular premature depolarization: Secondary | ICD-10-CM | POA: Diagnosis not present

## 2021-10-10 DIAGNOSIS — E785 Hyperlipidemia, unspecified: Secondary | ICD-10-CM

## 2021-10-10 MED ORDER — LORAZEPAM 0.5 MG PO TABS: ORAL_TABLET | ORAL | 0 refills | Status: DC

## 2021-10-10 NOTE — Patient Instructions (Signed)
Medication Instructions:  No changes *If you need a refill on your cardiac medications before your next appointment, please call your pharmacy*   Lab Work: Your provider would like for you to return in a few days to have the following labs drawn: fasting Lipid, Magnesium, and BMET. You do not need an appointment for the lab. Once in our office lobby there is a podium where you can sign in and ring the doorbell to alert [place] that you are here. The lab is open from 8:00 am to 4:30 pm; closed for lunch from 12:45pm-1:45pm.  If you have labs (blood work) drawn today and your tests are completely normal, you will receive your results only by: [place] (if you have MyChart) OR A paper copy in the mail If you have any lab test that is abnormal or we need to change your treatment, we will call you to review the results.   Testing/Procedures: Dr. Gardiner Rhyme would like for you to have a MRA of the Chest. They will call you to set this up. This will take place at [facility].   Follow-Up: At [facility], you and your health needs are our priority.  As part of our continuing mission to provide you with exceptional heart care, we have created designated Provider Care Teams.  These Care Teams include your primary Cardiologist (physician) and Advanced Practice Providers (APPs -  Physician Assistants and Nurse Practitioners) who all work together to provide you with the care you need, when you need it.  We recommend signing up for the patient portal called "MyChart".  Sign up information is provided on this After Visit Summary.  MyChart is used to connect with patients for Virtual Visits (Telemedicine).  Patients are able to view lab/test results, encounter notes, upcoming appointments, etc.  Non-urgent messages can be sent to your provider as well.   To learn more about what you can do with MyChart, go to [email or web address].    Your next appointment:   6 month(s)  The format for your next  appointment:   In Person  Provider:   Donato Heinz, MD {

## 2021-10-22 LAB — LIPID PANEL
Chol/HDL Ratio: 3.8 ratio (ref 0.0–5.0)
Cholesterol, Total: 113 mg/dL (ref 100–199)
HDL: 30 mg/dL — ABNORMAL LOW (ref >39)
LDL Chol Calc (NIH): 60 mg/dL (ref 0–99)
Triglycerides: 126 mg/dL (ref 0–149)
VLDL Cholesterol Cal: 23 mg/dL (ref 5–40)

## 2021-10-22 LAB — BASIC METABOLIC PANEL
BUN/Creatinine Ratio: 20 (ref 9–20)
BUN: 17 mg/dL (ref 6–24)
CO2: 29 mmol/L (ref 20–29)
Calcium: 9 mg/dL (ref 8.7–10.2)
Chloride: 99 mmol/L (ref 96–106)
Creatinine, Ser: 0.85 mg/dL (ref 0.76–1.27)
Glucose: 94 mg/dL (ref 70–99)
Potassium: 4.1 mmol/L (ref 3.5–5.2)
Sodium: 139 mmol/L (ref 134–144)
eGFR: 105 mL/min/{1.73_m2} (ref >59)

## 2021-10-22 LAB — MAGNESIUM: Magnesium: 2.1 mg/dL (ref 1.6–2.3)

## 2021-10-29 ENCOUNTER — Other Ambulatory Visit: Payer: Self-pay | Admitting: Cardiology

## 2021-11-04 ENCOUNTER — Encounter: Payer: Self-pay | Admitting: Cardiology

## 2021-11-04 MED ORDER — ROSUVASTATIN CALCIUM 10 MG PO TABS: 10.0000 mg | ORAL_TABLET | Freq: Every day | ORAL | 1 refills | Status: DC

## 2021-11-04 MED ORDER — AZILSARTAN-CHLORTHALIDONE 40-25 MG PO TABS: 1.0000 | ORAL_TABLET | Freq: Every day | ORAL | 3 refills | Status: DC

## 2021-11-04 MED ORDER — CARVEDILOL 25 MG PO TABS: ORAL_TABLET | ORAL | 3 refills | Status: AC

## 2021-11-05 ENCOUNTER — Telehealth: Payer: Self-pay | Admitting: Cardiology

## 2021-11-05 NOTE — Telephone Encounter (Signed)
?  Per MyChart scheduling message: ? ?Pt c/o of Chest Pain: STAT if CP now or developed within 24 hours ? ?1. Are you having CP right now?  ? ?2. Are you experiencing any other symptoms (ex. SOB, nausea, vomiting, sweating)?  ? ?3. How long have you been experiencing CP?  ? ?4. Is your CP continuous or coming and going?  ? ?5. Have you taken Nitroglycerin?  ??  ? ?Today my pressure are a lot higher than they been and I?ve been feeling light headed all day long hoping can come in get a ekg felt a little pain bin chest but went away but still lightheaded  ? ?Dizzy spelled keep coming and going  ?No sweating or SOB DONT NO WHAT CP is ?Just about all day just took pressure 125/95/65 I did feel nauseous earlier but ate a salad. Just feel strange never felt like this before  ?

## 2021-11-05 NOTE — Telephone Encounter (Signed)
Patient  reports waking up this morning with dizzy spells and nausea. He had a "brief sharp pain in the left armpit area". BP was 141/101, P 58. After his BP meds, BP 125/95, P 65. While on the phone BP 142/97, P58. Denies back and jaw pain. Dr. Flora Lipps (DOD) recommended patient be seen tomorrow or next day with DOD or go to urgent care. Appointment made for this Thursday with Dr. Servando Salina (DOD). Informed patient of appointment. He stated he will come. Recommended to patient that if chest pain or dizziness worsens, to go to the ED. Pt voiced understanding of this conversation. ?

## 2021-11-06 ENCOUNTER — Emergency Department (HOSPITAL_BASED_OUTPATIENT_CLINIC_OR_DEPARTMENT_OTHER): Payer: Medicare Other | Admitting: Radiology

## 2021-11-06 ENCOUNTER — Emergency Department (HOSPITAL_BASED_OUTPATIENT_CLINIC_OR_DEPARTMENT_OTHER): Payer: Medicare Other

## 2021-11-06 ENCOUNTER — Other Ambulatory Visit: Payer: Self-pay

## 2021-11-06 ENCOUNTER — Encounter (HOSPITAL_BASED_OUTPATIENT_CLINIC_OR_DEPARTMENT_OTHER): Payer: Self-pay | Admitting: Emergency Medicine

## 2021-11-06 ENCOUNTER — Other Ambulatory Visit (HOSPITAL_BASED_OUTPATIENT_CLINIC_OR_DEPARTMENT_OTHER): Payer: Self-pay

## 2021-11-06 ENCOUNTER — Emergency Department (HOSPITAL_BASED_OUTPATIENT_CLINIC_OR_DEPARTMENT_OTHER)
Admission: EM | Admit: 2021-11-06 | Discharge: 2021-11-06 | Disposition: A | Discharge status: Home / Self Care | Payer: Medicare Other | Attending: Emergency Medicine | Admitting: Emergency Medicine

## 2021-11-06 DIAGNOSIS — M546 Pain in thoracic spine: Secondary | ICD-10-CM | POA: Insufficient documentation

## 2021-11-06 DIAGNOSIS — R42 Dizziness and giddiness: Secondary | ICD-10-CM | POA: Insufficient documentation

## 2021-11-06 DIAGNOSIS — R110 Nausea: Secondary | ICD-10-CM | POA: Insufficient documentation

## 2021-11-06 DIAGNOSIS — Z79899 Other long term (current) drug therapy: Secondary | ICD-10-CM | POA: Insufficient documentation

## 2021-11-06 DIAGNOSIS — R0789 Other chest pain: Secondary | ICD-10-CM | POA: Insufficient documentation

## 2021-11-06 DIAGNOSIS — I10 Essential (primary) hypertension: Secondary | ICD-10-CM | POA: Diagnosis not present

## 2021-11-06 LAB — TROPONIN I (HIGH SENSITIVITY)
Troponin I (High Sensitivity): 5 ng/L (ref <18)
Troponin I (High Sensitivity): 5 ng/L (ref <18)

## 2021-11-06 LAB — BASIC METABOLIC PANEL
Anion gap: 9 (ref 5–15)
BUN: 8 mg/dL (ref 6–20)
CO2: 25 mmol/L (ref 22–32)
Calcium: 9.3 mg/dL (ref 8.9–10.3)
Chloride: 102 mmol/L (ref 98–111)
Creatinine, Ser: 0.8 mg/dL (ref 0.61–1.24)
GFR, Estimated: >60 mL/min (ref >60)
Glucose, Bld: 133 mg/dL — ABNORMAL HIGH (ref 70–99)
Potassium: 3.6 mmol/L (ref 3.5–5.1)
Sodium: 136 mmol/L (ref 135–145)

## 2021-11-06 LAB — CBC
HCT: 45.3 % (ref 39.0–52.0)
Hemoglobin: 14.5 g/dL (ref 13.0–17.0)
MCH: 28.4 pg (ref 26.0–34.0)
MCHC: 32 g/dL (ref 30.0–36.0)
MCV: 88.8 fL (ref 80.0–100.0)
Platelets: 126 10*3/uL — ABNORMAL LOW (ref 150–400)
RBC: 5.1 MIL/uL (ref 4.22–5.81)
RDW: 13.7 % (ref 11.5–15.5)
WBC: 5.4 10*3/uL (ref 4.0–10.5)
nRBC: 0 % (ref 0.0–0.2)

## 2021-11-06 MED ORDER — MECLIZINE HCL 25 MG PO TABS
25.0000 mg | ORAL_TABLET | Freq: Three times a day (TID) | ORAL | 0 refills | Status: DC | PRN
  Filled 2021-11-06: qty 30, 10d supply, fill #0

## 2021-11-06 MED ORDER — LIDOCAINE VISCOUS HCL 2 % MT SOLN
15.0000 mL | Freq: Once | OROMUCOSAL | Status: AC
  Administered 2021-11-06: 15 mL via ORAL
  Filled 2021-11-06: qty 15

## 2021-11-06 MED ORDER — ONDANSETRON HCL 4 MG/2ML IJ SOLN
4.0000 mg | Freq: Once | INTRAMUSCULAR | Status: AC
  Administered 2021-11-06: 4 mg via INTRAVENOUS
  Filled 2021-11-06: qty 2

## 2021-11-06 MED ORDER — ALUM & MAG HYDROXIDE-SIMETH 200-200-20 MG/5ML PO SUSP
30.0000 mL | Freq: Once | ORAL | Status: AC
  Administered 2021-11-06: 30 mL via ORAL
  Filled 2021-11-06: qty 30

## 2021-11-06 MED ORDER — IOHEXOL 350 MG/ML SOLN
140.0000 mL | Freq: Once | INTRAVENOUS | Status: AC | PRN
  Administered 2021-11-06: 140 mL via INTRAVENOUS

## 2021-11-06 MED ORDER — ONDANSETRON 4 MG PO TBDP
4.0000 mg | ORAL_TABLET | Freq: Three times a day (TID) | ORAL | 0 refills | Status: AC | PRN
Start: 2021-11-06 — End: ?
  Filled 2021-11-06: qty 20, 7d supply, fill #0

## 2021-11-06 NOTE — ED Notes (Signed)
Pt transported to CT ?

## 2021-11-06 NOTE — ED Notes (Signed)
ED Provider at bedside. 

## 2021-11-06 NOTE — ED Provider Notes (Signed)
?[facility] EMERGENCY DEPT ?Provider Note ? ? ?CSN: [id number] ?Arrival date & time: 11/06/21  1108 ? ?  ? ?History ? ?Chief Complaint  ?Patient presents with  ? Nausea  ? Back Pain  ? Dizziness  ? ? ?Ricky [name] is a 51 y.o. male. ? ? ?Back Pain ?Dizziness ? ?51 year old male with a history of thoracic aortic aneurysm, hypertension, hyperlipidemia, depression, anxiety who presents to the emergency department with roughly 2 days of nausea, lightheadedness, back pain.  The patient states that he occasionally feels symptoms associated with chest discomfort and mild mid to the left thoracic back discomfort.  He was advised to present to the emergency department if symptoms worsened.  He has a history of PVCs.  On chart review, his last TTE was on 07/2019 with a normal EF of 50 to 55%.  His last aortic aneurysm measurement was 4.7.  He states that he was told "if my aorta ever gets to be 5 or greater, I am getting surgery." ? ?Additionally, the patient endorses intermittent episodes of vertigo which have since resolved.  He denies any ataxia or difficulty with his gait.  Symptoms last for seconds at a time and are worsened by head movement. ? ?Home Medications ?Prior to Admission medications   ?Medication Sig Start Date End Date Taking? Authorizing Provider  ?meclizine (ANTIVERT) 25 MG tablet Take 1 tablet (25 mg total) by mouth 3 (three) times daily as needed for dizziness. ?Patient not taking: Reported on 11/07/2021 11/06/21  Yes Ernie AvenaLawsing, Vang, MD  ?ondansetron (ZOFRAN-ODT) 4 MG disintegrating tablet Take 1 tablet (4 mg total) by mouth every 8 (eight) hours as needed for nausea or vomiting. 11/06/21  Yes Ernie AvenaLawsing, Marciano, MD  ?Azilsartan-Chlorthalidone 40-25 MG TABS Take 1 tablet by mouth daily. 11/04/21   Little IshikawaSchumann, Christopher L, MD  ?Blood Pressure Monitoring (BLOOD PRESSURE CUFF) MISC XL Adult blood pressure cuff 08/18/19   Little IshikawaSchumann, Christopher L, MD  ?carvedilol (COREG) 25 MG tablet TAKE 1 TABLET(25  MG) BY MOUTH TWICE DAILY WITH A MEAL 11/04/21   Little IshikawaSchumann, Christopher L, MD  ?cyclobenzaprine (FLEXERIL) 10 MG tablet Take 10 mg by mouth 3 (three) times daily. 05/29/21   [provider]  ?ibuprofen (ADVIL) 200 MG tablet Take 400 mg by mouth every 6 (six) hours as needed for fever, headache or mild pain.    [provider]  ?Insulin Pen Needle 32G X 4 MM MISC Use to inject Ozempic once weekly ?Patient not taking: Reported on 11/07/2021 09/25/20   Little IshikawaSchumann, Christopher L, MD  ?LORazepam (ATIVAN) 0.5 MG tablet Take one tablet prior to the test ?Patient not taking: Reported on 11/07/2021 10/10/21   Little IshikawaSchumann, Christopher L, MD  ?NOREL AD 4-10-325 MG TABS Take 1 tablet by mouth daily. 09/16/21   [provider]  ?polyethylene glycol (MIRALAX / GLYCOLAX) 17 g packet Take 17 g by mouth daily. 05/28/21   Rolly SalterPatel, Pranav M, MD  ?potassium chloride SA (KLOR-CON) 20 MEQ tablet Take 2 tablets (40 mEq total) by mouth daily. ?Patient taking differently: Take 20 mEq by mouth daily. 05/10/21 05/05/22  Swayze, Ava, DO  ?rosuvastatin (CRESTOR) 10 MG tablet Take 1 tablet (10 mg total) by mouth daily. 11/04/21   Little IshikawaSchumann, Christopher L, MD  ?sildenafil (VIAGRA) 100 MG tablet TAKE 1/2 TO 1 TABLET BY MOUTH AS NEEDED. Defer further refills to PCP. 04/04/21   Little IshikawaSchumann, Christopher L, MD  ?   ? ?Allergies    ?Patient has no known allergies.   ? ?Review of  Systems   ?Review of Systems  ?Musculoskeletal:  Positive for back pain.  ?Neurological:  Positive for light-headedness.  ?All other systems reviewed and are negative. ? ?Physical Exam ?Updated Vital Signs ?BP (!) 146/110   Pulse 61   Temp 98.9 ?F (37.2 ?C)   Resp 15   Ht 6\' 1"  (1.854 m)   Wt 122 kg   SpO2 100%   BMI 35.49 kg/m?  ?Physical Exam ?Vitals and nursing note reviewed.  ?Constitutional:   ?   General: He is not in acute distress. ?   Appearance: He is well-developed.  ?HENT:  ?   Head: Normocephalic and atraumatic.  ?   Mouth/Throat:  ?   Mouth: Mucous  membranes are moist.  ?Eyes:  ?   Conjunctiva/sclera: Conjunctivae normal.  ?Neck:  ?   Vascular: No JVD.  ?Cardiovascular:  ?   Rate and Rhythm: Normal rate and regular rhythm.  ?   Pulses: Normal pulses.  ?   Comments: Radial pulses equal and symmetric bilaterally. ?Pulmonary:  ?   Effort: Pulmonary effort is normal. No respiratory distress.  ?   Breath sounds: Normal breath sounds.  ?Abdominal:  ?   Palpations: Abdomen is soft.  ?   Tenderness: There is no abdominal tenderness.  ?Musculoskeletal:     ?   General: No swelling.  ?   Cervical back: Neck supple.  ?   Right lower leg: No edema.  ?   Left lower leg: No edema.  ?Skin: ?   General: Skin is warm and dry.  ?   Capillary Refill: Capillary refill takes less than 2 seconds.  ?Neurological:  ?   General: No focal deficit present.  ?   Mental Status: He is alert and oriented to person, place, and time. Mental status is at baseline.  ?   GCS: GCS eye subscore is 4. GCS verbal subscore is 5. GCS motor subscore is 6.  ?   Cranial Nerves: Cranial nerves 2-12 are intact.  ?   Sensory: Sensation is intact.  ?   Motor: Motor function is intact.  ?   Coordination: Coordination is intact.  ?   Gait: Gait is intact.  ?Psychiatric:     ?   Mood and Affect: Mood is anxious.  ? ? ?ED Results / Procedures / Treatments   ?Labs ?(all labs ordered are listed, but only abnormal results are displayed) ?Labs Reviewed  ?BASIC METABOLIC PANEL - Abnormal; Notable for the following components:  ?    Result Value  ? Glucose, Bld 133 (*)   ? All other components within normal limits  ?CBC - Abnormal; Notable for the following components:  ? Platelets 126 (*)   ? All other components within normal limits  ?TROPONIN I (HIGH SENSITIVITY)  ?TROPONIN I (HIGH SENSITIVITY)  ? ? ?EKG ?EKG Interpretation ? ?Date/Time:  Wednesday November 06 2021 11:30:09 EDT ?Ventricular Rate:  60 ?PR Interval:  160 ?QRS Duration: 96 ?QT Interval:  368 ?QTC Calculation: 368 ?R Axis:   54 ?Text  Interpretation: Normal sinus rhythm Normal ECG No previous ECGs available Confirmed by [id number] ([id number]) on 11/06/2021 11:42:04 AM ? ?Radiology ?CT ANGIO HEAD NECK W WO CM ? ?Result Date: 11/06/2021 ?CLINICAL DATA:  Vertigo, central. Nausea, dizziness and back pain. History of enlarged aorta. EXAM: CT ANGIOGRAPHY HEAD AND NECK TECHNIQUE: Multidetector CT imaging of the head and neck was performed using the standard protocol during bolus administration of intravenous contrast. Multiplanar CT image reconstructions and  MIPs were obtained to evaluate the vascular anatomy. Carotid stenosis measurements (when applicable) are obtained utilizing NASCET criteria, using the distal internal carotid diameter as the denominator. RADIATION DOSE REDUCTION: This exam was performed according to the departmental dose-optimization program which includes automated exposure control, adjustment of the mA and/or kV according to patient size and/or use of iterative reconstruction technique. CONTRAST:  OMNIPAQUE IOHEXOL 350 MG/ML SOLN COMPARISON:  06/14/2004 head CT FINDINGS: CT HEAD FINDINGS Brain: The brain shows a normal appearance without evidence of malformation, atrophy, old or acute small or large vessel infarction, mass lesion, hemorrhage, hydrocephalus or extra-axial collection. Vascular: No hyperdense vessel. No evidence of atherosclerotic calcification. Skull: Normal.  No traumatic finding.  No focal bone lesion. Sinuses/Orbits: Sinuses are clear. Orbits appear normal. Mastoids are clear. Other: None significant CTA NECK FINDINGS Aortic arch: Minimal atherosclerotic calcification at the aortic arch. Branching pattern is normal without origin stenosis. Right carotid system: Common carotid artery widely patent to the bifurcation. Carotid bifurcation is normal. No soft or calcified plaque. Cervical ICA is normal. Left carotid system: Left carotid system similarly normal. Vertebral arteries: Both vertebral artery origins are  widely patent. Both vertebral arteries widely patent through the cervical region to the foramen magnum. Skeleton: Previous cervical fusion C2 through C7. No acute regional finding. Other neck: No mass or lymphadenopathy. Upper chest: N

## 2021-11-06 NOTE — Discharge Instructions (Addendum)
Your CT scan revealed the following findings: ?IMPRESSION:  ?1. Fusiform aneurysmal dilatation of the ascending aorta at 5 cm,  ?previously 4.7 cm. No dissection or acute aortic findings.  ?2. No acute findings in the chest, abdomen, or pelvis.  ?3. Multiple right rib fractures which are at least subacute or  ?remote. Left rib fractures are remote and healed.  ?4. Small hiatal hernia.  ? ?Please follow-up with cardiology and CT surgery regarding this new finding as your aorta from 2021 has increased in size from 4.4 to 5.0cm. ?

## 2021-11-06 NOTE — ED Triage Notes (Signed)
Patient arrives with complaints of nausea, dizziness, and back pain x2 days. ? ?Patient states that he has an enlarged aorta that is being followed by his cardiologist. He has an appointment with his doctor, but he was told if his symptoms worsened to go to the ED.  ?

## 2021-11-07 ENCOUNTER — Ambulatory Visit (INDEPENDENT_AMBULATORY_CARE_PROVIDER_SITE_OTHER): Payer: Medicare Other | Admitting: Cardiology

## 2021-11-07 ENCOUNTER — Encounter: Payer: Self-pay | Admitting: Cardiology

## 2021-11-07 ENCOUNTER — Telehealth: Payer: Self-pay | Admitting: Cardiology

## 2021-11-07 VITALS — BP 132/80 | HR 74 | Ht 72.0 in | Wt 260.4 lb

## 2021-11-07 DIAGNOSIS — I10 Essential (primary) hypertension: Secondary | ICD-10-CM | POA: Diagnosis not present

## 2021-11-07 DIAGNOSIS — I7121 Aneurysm of the ascending aorta, without rupture: Secondary | ICD-10-CM | POA: Diagnosis not present

## 2021-11-07 DIAGNOSIS — I2510 Atherosclerotic heart disease of native coronary artery without angina pectoris: Secondary | ICD-10-CM

## 2021-11-07 DIAGNOSIS — K30 Functional dyspepsia: Secondary | ICD-10-CM | POA: Diagnosis not present

## 2021-11-07 DIAGNOSIS — R110 Nausea: Secondary | ICD-10-CM | POA: Diagnosis not present

## 2021-11-07 DIAGNOSIS — I493 Ventricular premature depolarization: Secondary | ICD-10-CM

## 2021-11-07 NOTE — Telephone Encounter (Signed)
Spoke to patient-Dr. Bjorn Pippin in office this PM.  Rescheduled appointment with Dr. Bjorn Pippin to discuss.   ? ? ?

## 2021-11-07 NOTE — Telephone Encounter (Signed)
Patient states he is returning a call from this morning. He also says he was in the ED yesterday and they found his enlarged aorta went from 4.5 to 5 in size.  ?

## 2021-11-07 NOTE — Progress Notes (Signed)
?Cardiology Office Note:   ? ?Date:  11/07/2021  ? ?ID:  Ricky [name], DOB 1971-06-01, MRN [id number] ? ?PCP:  Suella Broad, FNP  ?Cardiologist:  Donato Heinz, MD  ?Electrophysiologist:  None  ? ?Referring MD: Suella Broad,*  ? ?No chief complaint on file. ? ? ? ?History of Present Illness:   ? ?Ricky [name] is a 51 y.o. male with a hx of frequent PVCs, thoracic aortic aneurysm, OSA hypertension, thrombocytopenia who presents for follow-up.  He was initially seen on 07/22/2019 after he was referred by Sheran Fava, FNP for an evaluation of PVCs.  Had an ED visit on 07/10/2019 with chest pain/body aches and lightheadedness.  EKG showed bigeminy. TTE was done on 07/28/2019 which showed low normal systolic function (EF 50 to 55%), septal hypokinesis.  Also was notable for ascending aortic aneurysm measuring up to 48 mm.  Underwent CTA chest on 08/16/2019, which showed ascending aortic aneurysm measuring up to 63mm.  Cardiac monitor showed frequent PVCs (23% of beats) and 120 episodes of SVT, lasting up to 1 minute.  Lexiscan Myoview on 08/18/2019 showed no evidence of ischemia.  Diagnosed with OSA.  He was referred to Dr. Lovena Le in EP for evaluation of his PVCs, recommended uptitrating his beta-blocker as initial treatment and repeat monitor once on Coreg 25 mg twice daily.  Repeat monitor in 11/11/2019 showed significant improvement in PVC burden (5.3% of beats).  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  Cardiac MRI on 02/25/2021 showed no LGE, EF 53%, dilated aortic root measuring 47 mm, normal RV function. ? ?Since last clinic visit, he presented to the ED yesterday with nausea, dizziness, and back pain.  CTA chest was done which showed ascending aortic dilatation measuring 5 cm, previously 4.7 cm. ?Reports was told it may have been vertigo.  He has been having persistent nausea and weight loss.  Denies any  chest pain, dyspnea, lower extremity edema, or palpitations. ? ? ?BP Readings from Last 3 Encounters:  ?11/07/21 132/80  ?11/06/21 (!) 146/110  ?10/10/21 120/80  ? ? ?Wt Readings from Last 3 Encounters:  ?11/07/21 260 lb 6.4 oz (118.1 kg)  ?11/06/21 269 lb (122 kg)  ?10/10/21 270 lb 3.2 oz (122.6 kg)  ? ? ? ? ?Past Medical History:  ?Diagnosis Date  ? Anxiety   ? Back pain   ? 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 Eval Dr Glenna Fellows, Neurosurgery 10/13  ? Chest pain   ? Depression   ? High cholesterol   ? Hypertension   ? Osteoarthritis   ? Osteoarthritis   ? Overweight   ? SOB (shortness of breath)   ? Thrombocytopenia ([place]) 07/07/2012  ? 138,000 08/26/11; 98,000 06/29/12  ? ? ?Past Surgical History:  ?Procedure Laterality Date  ? ADENOIDECTOMY    ? ANTERIOR FUSION CERVICAL SPINE    ? APPENDECTOMY    ? BOWEL RESECTION N/A 05/24/2021  ? Procedure: SMALL BOWEL RESECTION;  Surgeon: Jovita Kussmaul, MD;  Location: [facility];  Service: General;  Laterality: N/A;  ? HERNIA REPAIR    ? IRRIGATION AND DEBRIDEMENT SEBACEOUS CYST    ? on scalp  ? KNEE ARTHROSCOPY    ? x2  ? LAPAROSCOPY N/A 05/24/2021  ? Procedure: LAPAROSCOPY DIAGNOSTIC;  Surgeon: Jovita Kussmaul, MD;  Location: [facility];  Service: General;  Laterality: N/A;  ? LAPAROTOMY N/A 05/24/2021  ? Procedure: EXPLORATORY LAPAROTOMY;  Surgeon: Autumn Messing  III, MD;  Location: [facility];  Service: General;  Laterality: N/A;  ? LUMBAR FUSION    ? POSTERIOR FUSION CERVICAL SPINE    ? TONSILLECTOMY    ? ? ?Current Medications: ?Current Meds  ?Medication Sig  ? Azilsartan-Chlorthalidone 40-25 MG TABS Take 1 tablet by mouth daily.  ? Blood Pressure Monitoring (BLOOD PRESSURE CUFF) MISC XL Adult blood pressure cuff  ? carvedilol (COREG) 25 MG tablet TAKE 1 TABLET(25 MG) BY MOUTH TWICE DAILY WITH A MEAL  ? cyclobenzaprine (FLEXERIL) 10 MG tablet Take 10 mg by mouth 3 (three) times daily.  ? ibuprofen (ADVIL) 200 MG tablet Take 400 mg by mouth every 6 (six) hours as needed for fever, headache or  mild pain.  ? NOREL AD 4-10-325 MG TABS Take 1 tablet by mouth daily.  ? ondansetron (ZOFRAN-ODT) 4 MG disintegrating tablet Take 1 tablet (4 mg total) by mouth every 8 (eight) hours as needed for nausea or vomiting.  ? polyethylene glycol (MIRALAX / GLYCOLAX) 17 g packet Take 17 g by mouth daily.  ? potassium chloride SA (KLOR-CON) 20 MEQ tablet Take 2 tablets (40 mEq total) by mouth daily. (Patient taking differently: Take 20 mEq by mouth daily.)  ? rosuvastatin (CRESTOR) 10 MG tablet Take 1 tablet (10 mg total) by mouth daily.  ? sildenafil (VIAGRA) 100 MG tablet TAKE 1/2 TO 1 TABLET BY MOUTH AS NEEDED. Defer further refills to PCP.  ?  ? ?Allergies:   Patient has no known allergies.  ? ?Social History  ? ?Socioeconomic History  ? Marital status: Single  ?  Spouse name: Not on file  ? Number of children: Not on file  ? Years of education: Not on file  ? Highest education level: Not on file  ?Occupational History  ? Occupation: Disabled  ?Tobacco Use  ? Smoking status: Former  ?  Packs/day: 0.50  ?  Years: 6.00  ?  Pack years: 3.00  ?  Types: Cigarettes  ?  Quit date: 2013  ?  Years since quitting: 10.2  ? Smokeless tobacco: Never  ?Substance and Sexual Activity  ? Alcohol use: No  ? Drug use: No  ? Sexual activity: Not on file  ?Other Topics Concern  ? Not on file  ?Social History Narrative  ? Not on file  ? ?Social Determinants of Health  ? ?Financial Resource Strain: Not on file  ?Food Insecurity: Not on file  ?Transportation Needs: Not on file  ?Physical Activity: Not on file  ?Stress: Not on file  ?Social Connections: Not on file  ?  ? ?Family History: ?No family history of heart disease ? ?ROS:   ?Please see the history of present illness.    ?All other systems reviewed and are negative. ? ?EKGs/Labs/Other Studies Reviewed:   ? ?The following studies were reviewed today: ? ?EKG:   ?04/04/21: NSR, rate 50, no PVCs ?01/02/2021- The ekg ordered today demonstrates normal sinus rhythm, rate 62, no PVC's   ?10/01/2020- The ekg ordered demonstrates sinus rhythm, rate 93, PVCs ? ?Recent Labs: ?05/23/2021: ALT 23 ?10/21/2021: Magnesium 2.1 ?11/06/2021: BUN 8; Creatinine, Ser 0.80; Hemoglobin 14.5; Platelets 126; Potassium 3.6; Sodium 136  ?Recent Lipid Panel ?   ?Component Value Date/Time  ? CHOL 113 10/21/2021 1341  ? TRIG 126 10/21/2021 1341  ? HDL 30 (L) 10/21/2021 1341  ? CHOLHDL 3.8 10/21/2021 1341  ? [place] 60 10/21/2021 1341  ? ? ?Physical Exam:   ? ?VS:  BP 132/80   Pulse 74  Ht 6' (1.829 m)   Wt 260 lb 6.4 oz (118.1 kg)   SpO2 97%   BMI 35.32 kg/m?    ? ?Wt Readings from Last 3 Encounters:  ?11/07/21 260 lb 6.4 oz (118.1 kg)  ?11/06/21 269 lb (122 kg)  ?10/10/21 270 lb 3.2 oz (122.6 kg)  ?  ? ?GEN:  Well nourished, well developed in no acute distress ?HEENT: Normal ?NECK: No JVD ?CARDIAC: RRR, no murmurs, rubs, gallops ?RESPIRATORY:  Clear to auscultation without rales, wheezing or rhonchi  ?ABDOMEN: Soft, non-tender, non-distended ?MUSCULOSKELETAL:  No edema; No deformity  ?SKIN: Warm and dry ?NEUROLOGIC:  Alert and oriented x 3 ?PSYCHIATRIC:  Normal affect  ? ? ?Cardiac monitor 08/21/19: ?Frequent ventricular ectopy (23% of beats) ?120 episodes of SVT, longest lasting 59 seconds at 152 bpm ?One 4 beat episode of NSVT ?Patient triggered events corresponded to sinus rhythm with ventricular ectopy, incluging bigeminy ?  ?4 days of data recorded on Zio monitor. Patient had a min HR of 46 bpm, max HR of 207 bpm, and avg HR of 77 bpm. Predominant underlying rhythm was Sinus Rhythm. No atrial fibrillation, high degree block, or pauses noted. There was one 4 beat run of NSVT and 120 runs of SVT, longest lasting 59 seconds at 152 bpm.  Isolated atrial was ectopy was rare (<1%).  Very frequent ventricular ectopy (23% of beats).  Episodes of bigeminy (longest lasting 132 seconds) and trigeminy (longest lasting 98 seconds).  There were 2 triggered events, corresponding to sinus rhythm with ventricular bigeminy.   No  significant arrhythmias detected. ? ?CTA chest 08/16/19: ?Aneurysmal dilatation of the ascending thoracic aorta up to 47 mm. ?Recommend semi-annual imaging followup by CTA or MRA and referral to ?cardiothoracic surgery if not

## 2021-11-07 NOTE — Patient Instructions (Addendum)
Medication Instructions:  ?No Changes In Medications at this time.  ?*If you need a refill on your cardiac medications before your next appointment, please call your pharmacy* ? ?Follow-Up: ?At [name], you and your health needs are our priority.  As part of our continuing mission to provide you with exceptional heart care, we have created designated Provider Care Teams.  These Care Teams include your primary Cardiologist (physician) and Advanced Practice Providers (APPs -  Physician Assistants and Nurse Practitioners) who all work together to provide you with the care you need, when you need it. ? ?We recommend signing up for the patient portal called "MyChart".  Sign up information is provided on this After Visit Summary.  MyChart is used to connect with patients for Virtual Visits (Telemedicine).  Patients are able to view lab/test results, encounter notes, upcoming appointments, etc.  Non-urgent messages can be sent to your provider as well.   ?To learn more about what you can do with MyChart, go to [email or web address].   ? ?Your next appointment:   ?6 month(s) ? ?The format for your next appointment:   ?In Person ? ?Provider:   ?Donato Heinz, MD   ? ?Other Instructions ? ?REFERRAL TO CARDIOTHORACIC SURGERY  ? ?REFERRAL TO GI- SOMEONE WILL REACH OUT TO YOU TO SCHEDULE AN APPOINTMENT   ? ?Please check your blood pressure at home daily, write it down.  Call the office or send message via Mychart with the readings in 1 week for Dr. Gardiner Rhyme to review.  ? ?

## 2021-11-12 ENCOUNTER — Other Ambulatory Visit: Payer: Self-pay

## 2021-11-12 MED ORDER — POTASSIUM CHLORIDE CRYS ER 20 MEQ PO TBCR: 20.0000 meq | EXTENDED_RELEASE_TABLET | Freq: Every day | ORAL | 3 refills | Status: DC

## 2021-11-14 ENCOUNTER — Encounter: Payer: Self-pay | Admitting: Gastroenterology

## 2021-12-05 ENCOUNTER — Encounter: Payer: Self-pay | Admitting: Gastroenterology

## 2021-12-05 ENCOUNTER — Ambulatory Visit (INDEPENDENT_AMBULATORY_CARE_PROVIDER_SITE_OTHER): Payer: Medicare Other | Admitting: Gastroenterology

## 2021-12-05 VITALS — BP 112/66 | HR 76 | Ht 72.0 in | Wt 262.0 lb

## 2021-12-05 DIAGNOSIS — R110 Nausea: Secondary | ICD-10-CM | POA: Diagnosis not present

## 2021-12-05 DIAGNOSIS — Z1211 Encounter for screening for malignant neoplasm of colon: Secondary | ICD-10-CM

## 2021-12-05 DIAGNOSIS — Z1212 Encounter for screening for malignant neoplasm of rectum: Secondary | ICD-10-CM

## 2021-12-05 DIAGNOSIS — K449 Diaphragmatic hernia without obstruction or gangrene: Secondary | ICD-10-CM | POA: Diagnosis not present

## 2021-12-05 NOTE — Progress Notes (Signed)
? ?HPI : Ricky [name] is a very pleasant 51 year old male with a history of ascending aortic aneurysm, obesity and OA who is referred to [place] by Hoy Register, FNP for further evaluation of nausea and weight loss.  The patient states that he was having issues with nausea last month and earlier this month, but has not had any nausea at all the past few weeks.  Nausea is not typically a problem for him, it was just during this few week.  He does admit to recent weight loss, but attributes this to his intermittent bowel obstruction over the past year.  He underwent an ex lap which identified an internal hernia in October, and his appetite has improved significantly since then.  He has been actively trying to lose weight and become healthier in the last few months.  He denies any significant unintentional weight loss. ?He was noted to have a small hiatal hernia on recent CT evaluating his ascending aortic aneurysm.  He denies symptoms of frequent heartburn or acid reflux.  He will state he has some upper abdominal discomfort on occasion "when I eat the wrong thing".  In general he does not take any medications for this, and it is not a frequent problem.  He denies symptoms of nausea and vomiting or dysphagia ?Since his internal hernia was identified and fixed, he has been having very regular bowel movements ?End of last month, early April ?Was having nausea ? ? ?Past Medical History:  ?Diagnosis Date  ? Anxiety   ? Back pain   ? 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 Eval Dr Glenna Fellows, Neurosurgery 10/13  ? Chest pain   ? Depression   ? High cholesterol   ? Hypertension   ? Osteoarthritis   ? Osteoarthritis   ? Overweight   ? SOB (shortness of breath)   ? Thrombocytopenia ([place]) 07/07/2012  ? 138,000 08/26/11; 98,000 06/29/12  ? ? ? ?Past Surgical History:  ?Procedure Laterality Date  ? ADENOIDECTOMY    ? ANTERIOR FUSION CERVICAL SPINE    ? APPENDECTOMY    ? BOWEL RESECTION N/A 05/24/2021  ? Procedure: SMALL BOWEL  RESECTION;  Surgeon: Jovita Kussmaul, MD;  Location: [place];  Service: General;  Laterality: N/A;  ? HERNIA REPAIR    ? IRRIGATION AND DEBRIDEMENT SEBACEOUS CYST    ? on scalp  ? KNEE ARTHROSCOPY    ? x2  ? LAPAROSCOPY N/A 05/24/2021  ? Procedure: LAPAROSCOPY DIAGNOSTIC;  Surgeon: Jovita Kussmaul, MD;  Location: [place];  Service: General;  Laterality: N/A;  ? LAPAROTOMY N/A 05/24/2021  ? Procedure: EXPLORATORY LAPAROTOMY;  Surgeon: Jovita Kussmaul, MD;  Location: [place];  Service: General;  Laterality: N/A;  ? LUMBAR FUSION    ? POSTERIOR FUSION CERVICAL SPINE    ? TONSILLECTOMY    ? ?Family History  ?Problem Relation Age of Onset  ? Diabetes Mother   ? Hypertension Mother   ? Hyperlipidemia Mother   ? Stroke Mother   ? Anxiety disorder Mother   ? Obesity Mother   ? Hypertension Father   ? Hyperlipidemia Father   ? ?Social History  ? ?Tobacco Use  ? Smoking status: Former  ?  Packs/day: 0.50  ?  Years: 6.00  ?  Pack years: 3.00  ?  Types: Cigarettes  ?  Quit date: 2013  ?  Years since quitting: 10.3  ? Smokeless tobacco: Never  ?Substance Use Topics  ? Alcohol use: No  ? Drug use: No  ? ?  Current Outpatient Medications  ?Medication Sig Dispense Refill  ? Azilsartan-Chlorthalidone 40-25 MG TABS Take 1 tablet by mouth daily. 90 tablet 3  ? Blood Pressure Monitoring (BLOOD PRESSURE CUFF) MISC XL Adult blood pressure cuff 1 each 0  ? carvedilol (COREG) 25 MG tablet TAKE 1 TABLET(25 MG) BY MOUTH TWICE DAILY WITH A MEAL 180 tablet 3  ? cyclobenzaprine (FLEXERIL) 10 MG tablet Take 10 mg by mouth 3 (three) times daily.    ? ibuprofen (ADVIL) 200 MG tablet Take 400 mg by mouth every 6 (six) hours as needed for fever, headache or mild pain.    ? Insulin Pen Needle 32G X 4 MM MISC Use to inject Ozempic once weekly (Patient not taking: Reported on 11/07/2021) 100 each 1  ? LORazepam (ATIVAN) 0.5 MG tablet Take one tablet prior to the test (Patient not taking: Reported on 11/07/2021) 1 tablet 0  ? meclizine (ANTIVERT) 25 MG tablet Take 1  tablet (25 mg total) by mouth 3 (three) times daily as needed for dizziness. (Patient not taking: Reported on 11/07/2021) 30 tablet 0  ? NOREL AD 4-10-325 MG TABS Take 1 tablet by mouth daily.    ? ondansetron (ZOFRAN-ODT) 4 MG disintegrating tablet Take 1 tablet (4 mg total) by mouth every 8 (eight) hours as needed for nausea or vomiting. 20 tablet 0  ? polyethylene glycol (MIRALAX / GLYCOLAX) 17 g packet Take 17 g by mouth daily. 14 each 0  ? potassium chloride SA (KLOR-CON M) 20 MEQ tablet Take 1 tablet (20 mEq total) by mouth daily. 90 tablet 3  ? rosuvastatin (CRESTOR) 10 MG tablet Take 1 tablet (10 mg total) by mouth daily. 90 tablet 1  ? sildenafil (VIAGRA) 100 MG tablet TAKE 1/2 TO 1 TABLET BY MOUTH AS NEEDED. Defer further refills to PCP. 10 tablet 0  ? ?No current facility-administered medications for this visit.  ? ?No Known Allergies ? ? ?Review of Systems: ?All systems reviewed and negative except where noted in HPI.  ? ? ?CT ANGIO HEAD NECK W WO CM ? ?Result Date: 11/06/2021 ?CLINICAL DATA:  Vertigo, central. Nausea, dizziness and back pain. History of enlarged aorta. EXAM: CT ANGIOGRAPHY HEAD AND NECK TECHNIQUE: Multidetector CT imaging of the head and neck was performed using the standard protocol during bolus administration of intravenous contrast. Multiplanar CT image reconstructions and MIPs were obtained to evaluate the vascular anatomy. Carotid stenosis measurements (when applicable) are obtained utilizing NASCET criteria, using the distal internal carotid diameter as the denominator. RADIATION DOSE REDUCTION: This exam was performed according to the departmental dose-optimization program which includes automated exposure control, adjustment of the mA and/or kV according to patient size and/or use of iterative reconstruction technique. CONTRAST:  117mL OMNIPAQUE IOHEXOL 350 MG/ML SOLN COMPARISON:  06/14/2004 head CT FINDINGS: CT HEAD FINDINGS Brain: The brain shows a normal appearance without  evidence of malformation, atrophy, old or acute small or large vessel infarction, mass lesion, hemorrhage, hydrocephalus or extra-axial collection. Vascular: No hyperdense vessel. No evidence of atherosclerotic calcification. Skull: Normal.  No traumatic finding.  No focal bone lesion. Sinuses/Orbits: Sinuses are clear. Orbits appear normal. Mastoids are clear. Other: None significant CTA NECK FINDINGS Aortic arch: Minimal atherosclerotic calcification at the aortic arch. Branching pattern is normal without origin stenosis. Right carotid system: Common carotid artery widely patent to the bifurcation. Carotid bifurcation is normal. No soft or calcified plaque. Cervical ICA is normal. Left carotid system: Left carotid system similarly normal. Vertebral arteries: Both vertebral artery origins are  widely patent. Both vertebral arteries widely patent through the cervical region to the foramen magnum. Skeleton: Previous cervical fusion C2 through C7. No acute regional finding. Other neck: No mass or lymphadenopathy. Upper chest: Normal Review of the MIP images confirms the above findings CTA HEAD FINDINGS Anterior circulation: Both internal carotid arteries are widely patent through the skull base and siphon regions. No siphon stenosis or plaque. Anterior and middle cerebral vessels are normal. No large vessel occlusion or proximal stenosis. No aneurysm or vascular malformation. Posterior circulation: Both vertebral arteries are patent to the basilar. No basilar stenosis. Posterior circulation branch vessels are normal. Venous sinuses: Patent and normal. Anatomic variants: None significant. Review of the MIP images confirms the above findings IMPRESSION: Normal head CT. Normal CT angiography of the neck and intracranial vessels. Electronically Signed   By: Nelson Chimes M.D.   On: 11/06/2021 15:05  ? ?DG Chest 2 View ? ?Result Date: 11/06/2021 ?CLINICAL DATA:  Chest pain EXAM: CHEST - 2 VIEW COMPARISON:  12/02/2019 chest  radiograph. FINDINGS: Partially visualized surgical hardware from ACDF in the lower cervical spine. Stable cardiomediastinal silhouette with top-normal heart size. No pneumothorax. No pleural effusion. Lungs appe

## 2021-12-05 NOTE — Patient Instructions (Signed)
If you are age 53 or older, your body mass index should be between 23-30. Your Body mass index is 35.53 kg/m?Marland Kitchen If this is out of the aforementioned range listed, please consider follow up with your Primary Care Provider. ? ?If you are age 28 or younger, your body mass index should be between 19-25. Your Body mass index is 35.53 kg/m?Marland Kitchen If this is out of the aformentioned range listed, please consider follow up with your Primary Care Provider.  ? ?Your provider has ordered Cologuard testing as an option for colon cancer screening. This is performed by [facility] [employer] and may be out of network with your insurance. PRIOR to completing the test, it is YOUR responsibility to contact your insurance about covered benefits for this test. Your out of pocket expense could be anywhere from $0.00 to $649.00.  ? ?When you call to check coverage with your insurer, please provide the following information:  ? ?-The ONLY provider of Cologuard is [facility]  ?- CPT code for Cologuard is [phone].  ?-[employer] NPI # [id number]  ?-[employer] Tax ID # [id number]  ? ?We have already sent your demographic and insurance information to [facility] [employer] (phone number [phone]) and they should contact you within the next week regarding your test. If you have not heard from them within the next week, please call our office at [phone]. ?  ? ?The [facility] providers would like to encourage you to use [facility] to communicate with providers for non-urgent requests or questions.  Due to long hold times on the telephone, sending your provider a message by [facility] may be a faster and more efficient way to get a response.  Please allow 48 business hours for a response.  Please remember that this is for non-urgent requests.  ? ?It was a pleasure to see you today! ? ?Thank you for trusting me with your gastrointestinal care!   ? ?Scott E.Tomasa Rand, MD ? ? ?

## 2021-12-12 NOTE — Progress Notes (Signed)
? ?   ?[address] ?      [place] [address] ?            [phone]       ? ?Ricky [name] ?[place] Record [id number] ?Date of Birth: 05/21/1971 ? ?Referring: Suella Broad,* ?Primary Care: Suella Broad, FNP ?Primary Cardiologist:Christopher Fritz Pickerel, MD ? ?Chief Complaint:   No chief complaint on file. ? ? ?History of Present Illness:     ?Ricky [name] is a 51 year old gentleman that was previously followed for an ascending aortic aneurysm.  He comes in today with a new CT scan from last month which shows that it is grown from 4.7cm to 5 cm.  He denies any shortness of breath, chest or back pain.  He continues to lose weight remains quite active. ? ? ? ?Past Medical History:  ?Diagnosis Date  ? Anxiety   ? Back pain   ? 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 Eval Dr Glenna Fellows, Neurosurgery 10/13  ? Chest pain   ? Depression   ? High cholesterol   ? Hypertension   ? Osteoarthritis   ? Osteoarthritis   ? Overweight   ? SOB (shortness of breath)   ? Thrombocytopenia ([place]) 07/07/2012  ? 138,000 08/26/11; 98,000 06/29/12  ? ? ?Past Surgical History:  ?Procedure Laterality Date  ? ADENOIDECTOMY    ? ANTERIOR FUSION CERVICAL SPINE    ? APPENDECTOMY    ? BOWEL RESECTION N/A 05/24/2021  ? Procedure: SMALL BOWEL RESECTION;  Surgeon: Jovita Kussmaul, MD;  Location: [place];  Service: General;  Laterality: N/A;  ? HERNIA REPAIR    ? IRRIGATION AND DEBRIDEMENT SEBACEOUS CYST    ? on scalp  ? KNEE ARTHROSCOPY    ? x2  ? LAPAROSCOPY N/A 05/24/2021  ? Procedure: LAPAROSCOPY DIAGNOSTIC;  Surgeon: Jovita Kussmaul, MD;  Location: [place];  Service: General;  Laterality: N/A;  ? LAPAROTOMY N/A 05/24/2021  ? Procedure: EXPLORATORY LAPAROTOMY;  Surgeon: Jovita Kussmaul, MD;  Location: [place];  Service: General;  Laterality: N/A;  ? LUMBAR FUSION    ? POSTERIOR FUSION CERVICAL SPINE    ? TONSILLECTOMY    ? ? ?Social History  ? ?Tobacco Use  ?Smoking Status Former  ? Packs/day: 0.50  ? Years: 6.00   ? Pack years: 3.00  ? Types: Cigarettes  ? Quit date: 2013  ? Years since quitting: 10.3  ?Smokeless Tobacco Never  ?  ?Social History  ? ?Substance and Sexual Activity  ?Alcohol Use Yes  ? Comment: A glass of wine  ? ? ? ?No Known Allergies ? ? ? ?Current Outpatient Medications  ?Medication Sig Dispense Refill  ? Azilsartan-Chlorthalidone 40-25 MG TABS Take 1 tablet by mouth daily. 90 tablet 3  ? Blood Pressure Monitoring (BLOOD PRESSURE CUFF) MISC XL Adult blood pressure cuff 1 each 0  ? carvedilol (COREG) 25 MG tablet TAKE 1 TABLET(25 MG) BY MOUTH TWICE DAILY WITH A MEAL 180 tablet 3  ? cyclobenzaprine (FLEXERIL) 10 MG tablet Take 10 mg by mouth 3 (three) times daily.    ? ibuprofen (ADVIL) 200 MG tablet Take 400 mg by mouth every 6 (six) hours as needed for fever, headache or mild pain.    ? meclizine (ANTIVERT) 25 MG tablet Take 1 tablet (25 mg total) by mouth 3 (three) times daily as needed for dizziness. (Patient not taking: Reported on 11/07/2021) 30 tablet 0  ? NOREL AD 4-10-325 MG  TABS Take 1 tablet by mouth daily.    ? ondansetron (ZOFRAN-ODT) 4 MG disintegrating tablet Take 1 tablet (4 mg total) by mouth every 8 (eight) hours as needed for nausea or vomiting. (Patient not taking: Reported on 12/05/2021) 20 tablet 0  ? potassium chloride SA (KLOR-CON M) 20 MEQ tablet Take 1 tablet (20 mEq total) by mouth daily. 90 tablet 3  ? rosuvastatin (CRESTOR) 10 MG tablet Take 1 tablet (10 mg total) by mouth daily. 90 tablet 1  ? sildenafil (VIAGRA) 100 MG tablet TAKE 1/2 TO 1 TABLET BY MOUTH AS NEEDED. Defer further refills to PCP. 10 tablet 0  ? ?No current facility-administered medications for this visit.  ? ? ?(Not in a hospital admission) ? ? ?Family History  ?Problem Relation Age of Onset  ? Diabetes Mother   ? Hypertension Mother   ? Hyperlipidemia Mother   ? Stroke Mother   ? Anxiety disorder Mother   ? Obesity Mother   ? Hypertension Father   ? Hyperlipidemia Father   ? Colon cancer Neg Hx   ? Colon polyps  Neg Hx   ? Esophageal cancer Neg Hx   ? Liver cancer Neg Hx   ? ? ? ?Review of Systems:  ? ?Review of Systems  ?Constitutional:  Positive for weight loss.  ?Gastrointestinal:  Positive for nausea. Negative for heartburn.  ?Musculoskeletal:  Positive for joint pain.  ?Neurological:  Negative for dizziness and headaches.  ?  ? ?Physical Exam: ?There were no vitals taken for this visit. ?Physical Exam ?Constitutional:   ?   General: He is not in acute distress. ?   Appearance: Normal appearance. He is normal weight. He is not ill-appearing.  ?Cardiovascular:  ?   Rate and Rhythm: Normal rate.  ?Pulmonary:  ?   Effort: Pulmonary effort is normal.  ?Abdominal:  ?   General: There is no distension.  ?Musculoskeletal:     ?   General: Normal range of motion.  ?   Cervical back: Normal range of motion.  ?Skin: ?   General: Skin is warm and dry.  ?Neurological:  ?   General: No focal deficit present.  ?   Mental Status: He is alert and oriented to person, place, and time.  ?  ? ? ?Diagnostic Studies & Laboratory data: ?CT chest ?Cardiovascular: Fusiform aneurysmal dilatation of the ascending ?aorta, maximal dimension 5 cm, previously 4.7 cm. There is no ?dilatation of the aortic root. Normal caliber transverse and ?descending thoracic aorta. Mild aortic atherosclerosis. No ?dissection, aortic inflammation or vasculitis. Conventional ?branching pattern from the aortic arch. There is no pulmonary ?embolus. The heart is upper normal in size. No pericardial effusion. ? ? ?Echo: ?Echo from 2020 shows that his aortic valve is tricuspid.  There is no evidence of regurgitation. ? ?I have independently reviewed the above radiologic studies and discussed with the patient  ? ?Recent Lab Findings: ?Lab Results  ?Component Value Date  ? WBC 5.4 11/06/2021  ? HGB 14.5 11/06/2021  ? HCT 45.3 11/06/2021  ? PLT 126 (L) 11/06/2021  ? GLUCOSE 133 (H) 11/06/2021  ? CHOL 113 10/21/2021  ? TRIG 126 10/21/2021  ? HDL 30 (L) 10/21/2021  ? [place]  60 10/21/2021  ? ALT 23 05/23/2021  ? AST 23 05/23/2021  ? NA 136 11/06/2021  ? K 3.6 11/06/2021  ? CL 102 11/06/2021  ? CREATININE 0.80 11/06/2021  ? BUN 8 11/06/2021  ? CO2 25 11/06/2021  ? TSH 1.010 04/05/2020  ?  HGBA1C 6.6 (H) 04/05/2020  ? ? ? ? ?Assessment / Plan:   ?51 year old male presents for surgical evaluation of a 5 cm ascending aortic aneurysm.  His echocardiogram shows a trileaflet valve.  The root and arch do not appear dilated.  It has grown from 4.7 cm over the course of the last 2 years.  The patient is also obese weighing 262 pounds.  Based on size criteria he does not meet an indication for a sending aortic replacement.  We discussed the importance of good blood pressure control, smoking cessation and cholesterol management.  He is to abstain from any heavy lifting.  We also covered the details and symptoms of an aortic dissection.  He is aware of when he should present to the emergency department.  I will see him back in another 6 months with a repeat CTA. ? ? ? ? ?I  spent 40 minutes counseling the patient face to face. ? ? ?Ricky [name] ?12/12/2021 12:20 PM ? ? ? ? ? ? ?

## 2021-12-13 ENCOUNTER — Institutional Professional Consult (permissible substitution): Payer: Medicare Other | Admitting: Thoracic Surgery (Cardiothoracic Vascular Surgery)

## 2021-12-13 VITALS — BP 121/86 | HR 70 | Resp 20 | Ht 73.0 in | Wt 260.0 lb

## 2021-12-13 DIAGNOSIS — I7121 Aneurysm of the ascending aorta, without rupture: Secondary | ICD-10-CM | POA: Diagnosis not present

## 2021-12-18 ENCOUNTER — Telehealth: Payer: Self-pay | Admitting: Cardiovascular Disease

## 2021-12-18 NOTE — Telephone Encounter (Signed)
Returned call to patient who states that he has been having issues with his CPAP machine. Patient states that he recently lost 60 pounds and that he feels the seal on CPAP is no longer working. Patient states that it's either too much pressure or not enough. Patient would like to know if there is anything he can do to help fix this. Advised patient I would forward message to our sleep coordinator and Dr. Tresa Endo for them to review and advise.  ? ?Patient verbalized understanding.  ?

## 2021-12-18 NOTE — Telephone Encounter (Signed)
Patient sent message to scheduling, ? ?"I messaged doctor Tresa Endo after my appointment with Dr Cliffton Asters and he seriously advise me to get my sleep apnea machine recalibrated since I?ve lost close to 60 pounds and I told him it?s hard to sleep with it now because it?s either not blowing hard enough or blowing too hard. He said it was important that I get that done to start my aorta from getting any larger my precious seem to be a little higher in the morning than any other time of the day. But Dr. Tresa Endo is booked through October." ? ?

## 2022-01-03 ENCOUNTER — Ambulatory Visit: Payer: Medicare Other | Admitting: Cardiovascular Disease

## 2022-01-03 ENCOUNTER — Encounter: Payer: Self-pay | Admitting: Cardiovascular Disease

## 2022-01-03 VITALS — BP 118/80 | HR 65 | Ht 68.0 in | Wt 266.8 lb

## 2022-01-03 DIAGNOSIS — I7121 Aneurysm of the ascending aorta, without rupture: Secondary | ICD-10-CM

## 2022-01-03 DIAGNOSIS — I10 Essential (primary) hypertension: Secondary | ICD-10-CM | POA: Diagnosis not present

## 2022-01-03 DIAGNOSIS — I2510 Atherosclerotic heart disease of native coronary artery without angina pectoris: Secondary | ICD-10-CM | POA: Diagnosis not present

## 2022-01-03 DIAGNOSIS — G4733 Obstructive sleep apnea (adult) (pediatric): Secondary | ICD-10-CM

## 2022-01-03 DIAGNOSIS — I493 Ventricular premature depolarization: Secondary | ICD-10-CM

## 2022-01-03 NOTE — Patient Instructions (Addendum)
Medication Instructions:  Continue same medications *If you need a refill on your cardiac medications before your next appointment, please call your pharmacy*   Lab Work: None ordered   Testing/Procedures: None ordered   Follow-Up: At [facility], you and your health needs are our priority.  As part of our continuing mission to provide you with exceptional heart care, we have created designated Provider Care Teams.  These Care Teams include your primary Cardiologist (physician) and Advanced Practice Providers (APPs -  Physician Assistants and Nurse Practitioners) who all work together to provide you with the care you need, when you need it.  We recommend signing up for the patient portal called "MyChart".  Sign up information is provided on this After Visit Summary.  MyChart is used to connect with patients for Virtual Visits (Telemedicine).  Patients are able to view lab/test results, encounter notes, upcoming appointments, etc.  Non-urgent messages can be sent to your provider as well.   To learn more about what you can do with MyChart, go to [email or web address].    Your next appointment:  [facility]  Wed 11/15 at 11:20 am     The format for your next appointment: Office   Provider:  [facility]   Important Information About Sugar

## 2022-01-03 NOTE — Progress Notes (Signed)
Cardiology Office Note    Date:  01/12/2022   ID:  Ricky [name], DOB [birth year], MRN [id number]  PCP:  Suella Broad, FNP  Cardiologist:  Ricky Majestic, MD (sleep): Ricky [name]  2 year f/u sleep evaluation  History of Present Illness:  Ricky [name] is a 51 y.o. male followed by Ricky [name] for cardiology care.  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  Due to concerns for obstructive sleep apnea, he was referred for a diagnostic polysomnogram on September 06, 2019.  This revealed moderate sleep apnea with an AHI at 15.0 and RDI of 17.  He had severe sleep apnea during REM sleep with an AHI of 48/h and with supine sleep with an AHI of 56.8/h.  There was evidence for loud snoring and significant oxygen desaturation to a nadir of 77%.  He subsequently was referred for CPAP titration trial on October 18, 2019.  CPAP was titrated up to optimal pressure at 9 cm.  AHI was 0 with O2 nadir at 91%.  He was subsequently set up with CPAP therapy with [facility] as his DME company.  Prior to treatment, he admitted to snoring, nocturia several times per night, daytime sleepiness, and nonrestorative sleep.  Since initiating CPAP therapy he has felt improved.  Typically he goes to bed between 9 and 11 PM and wakes up approximately 7 to 7:30 AM.  He has had issues with his back and has undergone neck surgery as well as low back surgery by Dr. Carloyn [name].  He also has muscle spasms to his back at times making sleep difficult.  A  I last saw him on May 28, 20021. A download was obtained in the office today from December 14, 2019 through Jan 12, 2020 which confirms 100% usage.  Sleep duration is on average 5 hours and 59 minutes.  At his 9 cm set pressure, AHI is 5.8.  There is no significant mask leak.  He has been using a ResMed AirFit F 20 mask which he feels he tolerates well.   He  denies any bruxism, restless legs, hypnagogic hallucinations, or cataplectic events.  An Epworth Sleepiness Scale score was calculated in the office today and this endorsed at 9, as shown below:  Epworth Sleepiness Scale: Situation   Chance of Dozing/Sleeping (0 = never , 1 = slight chance , 2 = moderate chance , 3 = high chance )   sitting and reading 1   watching TV 1   sitting inactive in a public place 0   being a passenger in a motor vehicle for an hour or more 2   lying down in the afternoon 3   sitting and talking to someone 1   sitting quietly after lunch (no alcohol) 1   while stopped for a few minutes in traffic as the driver 0   Total Score  9   He denies chest pain.  He denies any significant increase in palpitations on his medical regimen with carvedilol 25 mg twice a day he continues to be on amlodipine 5 mg, and azilsartan chlorthalidone 40/25 mg in addition to spironolactone for hypertension.   Since I last saw him, he had developed bowel obstruction, requiring surgery and has lost over 30 pounds.  He has continued to be seen by Ricky [name].  He has been treated with beta-blocker for PVCs.  Coronary CTA in  April 2021 showed calcium score 19 (84th percentile with minimal nonobstructive CAD in mid LAD.  Cardiac MRI on February 25, 2021 showed no LGE, EF 53%, with a dilated aortic root at 47 mm and normal RV function.  He continues to have his CPAP unit but unfortunately is noncompliant with use.  A download from October 16, 2021 through November 16, 2021 only showed 16% days of usage averaging only 1 hour and 36 minutes.  His unit was set at a range of 8 to 16 cm of water and AHI was 4.5.  95th percentile pressure was 12.9.  He presents for follow-up evaluation.   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   Eval Dr Glenna [name], Neurosurgery 10/13   Chest pain    Depression    High cholesterol    Hypertension    Osteoarthritis     Osteoarthritis    Overweight    SOB (shortness of breath)    Thrombocytopenia ([place]) 07/07/2012   138,000 08/26/11; 98,000 06/29/12    Past Surgical History:  Procedure Laterality Date   ADENOIDECTOMY     ANTERIOR FUSION CERVICAL SPINE     APPENDECTOMY     BOWEL RESECTION N/A 05/24/2021   Procedure: SMALL BOWEL RESECTION;  Surgeon: Ricky Kussmaul, MD;  Location: [facility];  Service: General;  Laterality: N/A;   HERNIA REPAIR     IRRIGATION AND DEBRIDEMENT SEBACEOUS CYST     on scalp   KNEE ARTHROSCOPY     x2   LAPAROSCOPY N/A 05/24/2021   Procedure: LAPAROSCOPY DIAGNOSTIC;  Surgeon: Ricky Kussmaul, MD;  Location: [facility];  Service: General;  Laterality: N/A;   LAPAROTOMY N/A 05/24/2021   Procedure: EXPLORATORY LAPAROTOMY;  Surgeon: Ricky Kussmaul, MD;  Location: [facility];  Service: General;  Laterality: N/A;   LUMBAR FUSION     POSTERIOR FUSION CERVICAL SPINE     TONSILLECTOMY      Current Medications: Outpatient Medications Prior to Visit  Medication Sig Dispense Refill   Azilsartan-Chlorthalidone 40-25 MG TABS Take 1 tablet by mouth daily. 90 tablet 3   Blood Pressure Monitoring (BLOOD PRESSURE CUFF) MISC XL Adult blood pressure cuff 1 each 0   carvedilol (COREG) 25 MG tablet TAKE 1 TABLET(25 MG) BY MOUTH TWICE DAILY WITH A MEAL 180 tablet 3   cyclobenzaprine (FLEXERIL) 10 MG tablet Take 10 mg by mouth 3 (three) times daily.     ibuprofen (ADVIL) 200 MG tablet Take 400 mg by mouth every 6 (six) hours as needed for fever, headache or mild pain.     meclizine (ANTIVERT) 25 MG tablet Take 1 tablet (25 mg total) by mouth 3 (three) times daily as needed for dizziness. 30 tablet 0   NOREL AD 4-10-325 MG TABS Take 1 tablet by mouth daily.     ondansetron (ZOFRAN-ODT) 4 MG disintegrating tablet Take 1 tablet (4 mg total) by mouth every 8 (eight) hours as needed for nausea or vomiting. 20 tablet 0   potassium chloride SA (KLOR-CON M) 20 MEQ tablet Take 1 tablet (20 mEq total) by mouth daily. 90  tablet 3   rosuvastatin (CRESTOR) 10 MG tablet Take 1 tablet (10 mg total) by mouth daily. 90 tablet 1   sildenafil (VIAGRA) 100 MG tablet TAKE 1/2 TO 1 TABLET BY MOUTH AS NEEDED. Defer further refills to PCP. 10 tablet 0   No facility-administered medications prior to visit.  Allergies:   Patient has no known allergies.   Social History   Socioeconomic History   Marital status: Single    Spouse name: Not on file   Number of children: Not on file   Years of education: Not on file   Highest education level: Not on file  Occupational History   Occupation: Disabled  Tobacco Use   Smoking status: Former    Packs/day: 0.50    Years: 6.00    Pack years: 3.00    Types: Cigarettes    Quit date: 2013    Years since quitting: 10.4   Smokeless tobacco: Never  Vaping Use   Vaping Use: Never used  Substance and Sexual Activity   Alcohol use: Yes    Comment: A glass of wine   Drug use: No   Sexual activity: Not on file  Other Topics Concern   Not on file  Social History Narrative   Not on file   Social Determinants of Health   Financial Resource Strain: Not on file  Food Insecurity: Not on file  Transportation Needs: Not on file  Physical Activity: Not on file  Stress: Not on file  Social Connections: Not on file    Socially he is single and has 2 children ages 1 and 67.  He lives with his son.  Family History: Family history is notable in that his father is deceased and died at age 36 and had muscular dystrophy and dementia. Mother is alive at 69 and had a history of PE and Covid.  1 brother age 36 has sleep apnea.  He has 1 sister.  ROS General: Negative; No fevers, chills, or night sweats;  HEENT: Negative; No changes in vision or hearing, sinus congestion, difficulty swallowing Pulmonary: Negative; No cough, wheezing, shortness of breath, hemoptysis Cardiovascular: Positive for hypertension, palpitations, PVCs/SVT, ascending aortic aneurysm GI: Negative; No nausea,  vomiting, diarrhea, or abdominal pain GU: Negative; No dysuria, hematuria, or difficulty voiding Musculoskeletal: Negative; no myalgias, joint pain, or weakness Hematologic/Oncology: Negative; no easy bruising, bleeding Endocrine: Negative; no heat/cold intolerance; no diabetes Neuro: Negative; no changes in balance, headaches Skin: Negative; No rashes or skin lesions Psychiatric: Negative; No behavioral problems, depression Sleep: Not compliant with CPAP. Other comprehensive 14 point system review is negative.   PHYSICAL EXAM:   VS:  BP 118/80   Pulse 65   Ht '5\' 8"'  (1.727 m)   Wt 266 lb 12.8 oz (121 kg)   SpO2 99%   BMI 40.57 kg/m     Repeat blood pressure by me was 112/76  Wt Readings from Last 3 Encounters:  01/03/22 266 lb 12.8 oz (121 kg)  12/13/21 260 lb (117.9 kg)  12/05/21 262 lb (118.8 kg)    General: Alert, oriented, no distress.  Skin: normal turgor, no rashes, warm and dry HEENT: Normocephalic, atraumatic. Pupils equal round and reactive to light; sclera anicteric; extraocular muscles intact;  Nose without nasal septal hypertrophy Mouth/Parynx benign; Mallinpatti scale 3 Neck: No JVD, no carotid bruits; normal carotid upstroke Lungs: clear to ausculatation and percussion; no wheezing or rales Chest wall: without tenderness to palpitation Heart: PMI not displaced, RRR, s1 s2 normal, 1/6 systolic murmur, no diastolic murmur, no rubs, gallops, thrills, or heaves Abdomen: soft, nontender; no hepatosplenomehaly, BS+; abdominal aorta nontender and not dilated by palpation. Back: no CVA tenderness Pulses 2+ Musculoskeletal: full range of motion, normal strength, no joint deformities Extremities: no clubbing cyanosis or edema, Homan's sign negative  Neurologic: grossly nonfocal; Cranial  nerves grossly wnl Psychologic: Normal mood and affect   Studies/Labs Reviewed:    May 19,2023 ECG (independently read by me): NSR at 65, noectopy  I personally reviewed his ECG  from December 02, 2019 which showed normal sinus rhythm at 67 with incomplete right bundle branch block.  Recent Labs:    Latest Ref Rng & Units 11/06/2021   11:34 AM 10/21/2021    1:41 PM 05/27/2021   12:27 AM  BMP  Glucose 70 - 99 mg/dL 133   94   98    BUN 6 - 20 mg/dL 8   17   <5    Creatinine 0.61 - 1.24 mg/dL 0.80   0.85   0.74    BUN/Creat Ratio 9 - 20  20     Sodium 135 - 145 mmol/L 136   139   137    Potassium 3.5 - 5.1 mmol/L 3.6   4.1   3.8    Chloride 98 - 111 mmol/L 102   99   105    CO2 22 - 32 mmol/L '25   29   26    ' Calcium 8.9 - 10.3 mg/dL 9.3   9.0   8.1          Latest Ref Rng & Units 05/27/2021   12:27 AM 05/26/2021    1:51 AM 05/23/2021    3:41 PM  Hepatic Function  Total Protein 6.5 - 8.1 g/dL   7.1    Albumin 3.5 - 5.0 g/dL 2.8   3.0   4.2    AST 15 - 41 U/L   23    ALT 0 - 44 U/L   23    Alk Phosphatase 38 - 126 U/L   70    Total Bilirubin 0.3 - 1.2 mg/dL   0.9         Latest Ref Rng & Units 11/06/2021   11:34 AM 05/23/2021    3:41 PM 05/09/2021    3:19 AM  CBC  WBC 4.0 - 10.5 K/uL 5.4   6.0   4.3    Hemoglobin 13.0 - 17.0 g/dL 14.5   14.0   13.0    Hematocrit 39.0 - 52.0 % 45.3   44.2   40.2    Platelets 150 - 400 K/uL 126   130   122     Lab Results  Component Value Date   MCV 88.8 11/06/2021   MCV 89.3 05/23/2021   MCV 88.4 05/09/2021   Lab Results  Component Value Date   TSH 1.010 04/05/2020   Lab Results  Component Value Date   HGBA1C 6.6 (H) 04/05/2020     BNP No results found for: BNP  ProBNP No results found for: PROBNP   Lipid Panel     Component Value Date/Time   CHOL 113 10/21/2021 1341   TRIG 126 10/21/2021 1341   HDL 30 (L) 10/21/2021 1341   CHOLHDL 3.8 10/21/2021 1341   LDLCALC 60 10/21/2021 1341   LABVLDL 23 10/21/2021 1341     RADIOLOGY: No results found.   Additional studies/ records that were reviewed today include:   CPAP TITRATION STUDY:  10/18/2019 CLINICAL INFORMATION The patient is referred for a  CPAP titration to treat sleep apnea.   Date of NPSG: 09/06/2019: AHI 15.0; RDI 17; REM AHI 48.0/h; supine AHI 56.8/h: loud snoring; O2 desaturation to 77%.   SLEEP STUDY TECHNIQUE As per the AASM Manual for the Scoring of Sleep and Associated Events  v2.3 (April 2016) with a hypopnea requiring 4% desaturations.   The channels recorded and monitored were frontal, central and occipital EEG, electrooculogram (EOG), submentalis EMG (chin), nasal and oral airflow, thoracic and abdominal wall motion, anterior tibialis EMG, snore microphone, electrocardiogram, and pulse oximetry. Continuous positive airway pressure (CPAP) was initiated at the beginning of the study and titrated to treat sleep-disordered breathing.   MEDICATIONS amLODipine (NORVASC) 5 MG tablet Azilsartan-Chlorthalidone 40-25 MG TABS Blood Pressure Monitoring (BLOOD PRESSURE CUFF) MISC carvedilol (COREG) 25 MG tablet cyclobenzaprine (FLEXERIL) 10 MG tablet Potassium Chloride ER 20 MEQ TBCR   Medications self-administered by patient taken the night of the study : POTASSIUM CHLORIDE, CARVEDILOL   TECHNICIAN COMMENTS Comments added by technician: Patient was restless all through the night. Comments added by scorer: N/A   RESPIRATORY PARAMETERS Optimal PAP Pressure (cm):  9          AHI at Optimal Pressure (/hr):            1.7 Overall Minimal O2 (%):         88.0     Supine % at Optimal Pressure (%):    2 Minimal O2 at Optimal Pressure (%): 91.0        SLEEP ARCHITECTURE The study was initiated at 10:27:22 PM and ended at 4:54:53 AM.   Sleep onset time was 12.3 minutes and the sleep efficiency was 74.4%%. The total sleep time was 288.5 minutes.   The patient spent 15.6%% of the night in stage N1 sleep, 73.8%% in stage N2 sleep, 0.0%% in stage N3 and 10.6% in REM.Stage REM latency was 115.0 minutes   Wake after sleep onset was 86.7. Alpha intrusion was absent. Supine sleep was 16.64%.   CARDIAC DATA The 2 lead EKG  demonstrated sinus rhythm. The mean heart rate was 61.9 beats per minute. Other EKG findings include: PVCs.   LEG MOVEMENT DATA The total Periodic Limb Movements of Sleep (PLMS) were 0. The PLMS index was 0.0. A PLMS index of <15 is considered normal in adults.   IMPRESSIONS - CPAP was initiated at 6 cm and was titrated to optimal PAP pressure at 9 cm of water; AHI 0; O2 nadir 91%. - Central sleep apnea was not noted during this titration (CAI = 0.6/h). - Mild oxygen desaturations to a nadir of 88.0% at 8 cm. - The patient snored with soft snoring volume during this titration study. - 2-lead EKG demonstrated: occasional PVCs - Clinically significant periodic limb movements were not noted during this study. Arousals associated with PLMs were rare.   DIAGNOSIS - Obstructive Sleep Apnea (327.23 [G47.33 ICD-10])   RECOMMENDATIONS - Recommend an initial trial of CPAP therapy on 9 cm H2O with heated humidification.  A Medium Wide size Philips Respironics Full Face Mask Dreamwear mask was used for the titration. - Effort should be made to optimize nasal and oropharyngeal patency. - Patient should be counseled to avoid supine sleep. - Avoid alcohol, sedatives and other CNS depressants that may worsen sleep apnea and disrupt normal sleep architecture. - Sleep hygiene should be reviewed to assess factors that may improve sleep quality. - Weight management and regular exercise should be initiated or continued. - Recommend a download in 30 days and sleep clinic evaluation after 4 weeks of therapy.     [Electronically signed] 10/24/2019 04:21 PM    ASSESSMENT:    1. OSA (obstructive sleep apnea)   2. PVC's (premature ventricular contractions)   3. CAD in native artery   4.  Primary hypertension   5. Aneurysm of ascending aorta without rupture ([place])   6. Morbid obesity [facility])     PLAN:  Mr. Malek Skog is a 51 year-old male who has a history of hypertension on multiple drug regimen, an  ascending aortic aneurysm measuring 4.7 cm  frequent PVCs with burst of SVT noted on cardiac monitoring and previously had had atypical chest pain with a normal Lexiscan Myoview study.  He had symptoms highly suggestive of sleep apnea and his diagnostic polysomnogram confirmed moderate overall sleep apnea but severe during REM sleep and with supine position and was associated with significant oxygen desaturation to a nadir of 77%.  When I initially saw him, and extensive discussion with him regarding untreated sleep apnea and its effects on normal sleep architecture and potential adverse cardiovascular consequences.  When last seen, he was compliant and had noticed significant improvement in prior symptomatology.  Apparently, most recently, he had issues with bowel obstruction requiring surgery, lost 30 pounds since October 2022, and recently has not been using therapy.  His most recent download shows his last usage today at the end of March.  Presently, I will adjust his settings and will change his ramp to off and change his settings to a range of 6 to 15 cm of water.  I again discussed potential adverse cardiovascular consequences if sleep apnea is untreated particularly with reference to blood pressure, potential for nocturnal arrhythmias, increased risk for atrial fibrillation, insulin resistance, GERD, increased inflammation and potential for nocturnal ischemia contributed by nocturnal hypoxemia if atherosclerosis is present.  Hopefully he will reinitiate therapy and tolerate the pressure change.  He will follow-up with Ricky [name] for his cardiology care.  I will reevaluate him in 6 months or sooner as needed.   Medication Adjustments/Labs and Tests Ordered: Current medicines are reviewed at length with the patient today.  Concerns regarding medicines are outlined above.  Medication changes, Labs and Tests ordered today are listed in the Patient Instructions below. Patient Instructions  Medication  Instructions:  Continue same medications *If you need a refill on your cardiac medications before your next appointment, please call your pharmacy*   Lab Work: None ordered   Testing/Procedures: None ordered   Follow-Up: At [facility], you and your health needs are our priority.  As part of our continuing mission to provide you with exceptional heart care, we have created designated Provider Care Teams.  These Care Teams include your primary Cardiologist (physician) and Advanced Practice Providers (APPs -  Physician Assistants and Nurse Practitioners) who all work together to provide you with the care you need, when you need it.  We recommend signing up for the patient portal called "MyChart".  Sign up information is provided on this After Visit Summary.  MyChart is used to connect with patients for Virtual Visits (Telemedicine).  Patients are able to view lab/test results, encounter notes, upcoming appointments, etc.  Non-urgent messages can be sent to your provider as well.   To learn more about what you can do with MyChart, go to [email or web address].    Your next appointment:  6 [facility] Sleep Clinic  Wed 11/15 at 11:20 am     The format for your next appointment: Office   Provider:  [facility]   Important Information About Sugar         Signed, Ricky Majestic, MD  01/12/2022 [facility] [address] [place], [place], [place]  [address] Phone: [phone]

## 2022-01-12 ENCOUNTER — Encounter: Payer: Self-pay | Admitting: Cardiovascular Disease

## 2022-03-26 ENCOUNTER — Encounter (INDEPENDENT_AMBULATORY_CARE_PROVIDER_SITE_OTHER): Payer: Self-pay

## 2022-04-14 ENCOUNTER — Telehealth: Payer: Self-pay | Admitting: Cardiology

## 2022-04-14 MED ORDER — AZILSARTAN-CHLORTHALIDONE 40-25 MG PO TABS: 1.0000 | ORAL_TABLET | Freq: Every day | ORAL | 3 refills | Status: DC

## 2022-04-14 NOTE — Telephone Encounter (Signed)
*  STAT* If patient is at the pharmacy, call can be transferred to refill team.   1. Which medications need to be refilled? (please list name of each medication and dose if known) need a new prescription to local pharmacy for Azilsartan Chlorthalidone  2. Which pharmacy/location (including street and city if local pharmacy) is medication to be sent to? [facility] [address] [place], [place], [place] c 3. Do they need a 30 day or 90 day supply? #7- need enough called in today until his mail order supply comes in- he is completely out

## 2022-04-25 ENCOUNTER — Encounter: Payer: Self-pay | Admitting: Cardiovascular Disease

## 2022-04-27 NOTE — Progress Notes (Unsigned)
Cardiology Office Note:    Date:  04/28/2022   ID:  Ricky [name], DOB March 03, 1971, MRN [id number]  PCP:  Maryagnes Amos, FNP  Cardiologist:  Little Ishikawa, MD  Electrophysiologist:  None   Referring MD: Maryagnes Amos   Chief Complaint  Patient presents with   Hypertension    History of Present Illness:    Ricky [name] is a 51 y.o. male with a hx of frequent PVCs, thoracic aortic aneurysm, OSA hypertension, thrombocytopenia who presents for follow-up.  He was initially seen on 07/22/2019 after he was referred by Caryn Bee, FNP for an evaluation of PVCs.  Had an ED visit on 07/10/2019 with chest pain/body aches and lightheadedness.  EKG showed bigeminy. TTE was done on 07/28/2019 which showed low normal systolic function (EF 50 to 55%), septal hypokinesis.  Also was notable for ascending aortic aneurysm measuring up to 48 mm.  Underwent CTA chest on 08/16/2019, which showed ascending aortic aneurysm measuring up to 32mm.  Cardiac monitor showed frequent PVCs (23% of beats) and 120 episodes of SVT, lasting up to 1 minute.  Lexiscan Myoview on 08/18/2019 showed no evidence of ischemia.  Diagnosed with OSA.  He was referred to Dr. Ladona Ridgel in EP for evaluation of his PVCs, recommended uptitrating his beta-blocker as initial treatment and repeat monitor once on Coreg 25 mg twice daily.  Repeat monitor in 11/11/2019 showed significant improvement in PVC burden (5.3% of beats).  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  Cardiac MRI on 02/25/2021 showed no LGE, EF 53%, dilated aortic root measuring 47 mm, normal RV function.  Since last clinic visit, he reports that he has been doing well.  States that BP has been well controlled.  Has had issues with his CPAP, only using 2 hours per night.  Denies any chest pain, dyspnea, lightheadedness, syncope, lower extremity edema, or palpitations.    BP  Readings from Last 3 Encounters:  04/28/22 111/85  01/03/22 118/80  12/13/21 121/86    Wt Readings from Last 3 Encounters:  04/28/22 282 lb 12.8 oz (128.3 kg)  01/03/22 266 lb 12.8 oz (121 kg)  12/13/21 260 lb (117.9 kg)      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   Eval Dr Trey Sailors, Neurosurgery 10/13   Chest pain    Depression    High cholesterol    Hypertension    Osteoarthritis    Osteoarthritis    Overweight    SOB (shortness of breath)    Thrombocytopenia (HCC) 07/07/2012   138,000 08/26/11; 98,000 06/29/12    Past Surgical History:  Procedure Laterality Date   ADENOIDECTOMY     ANTERIOR FUSION CERVICAL SPINE     APPENDECTOMY     BOWEL RESECTION N/A 05/24/2021   Procedure: SMALL BOWEL RESECTION;  Surgeon: Griselda Miner, MD;  Location: [facility] OR;  Service: General;  Laterality: N/A;   HERNIA REPAIR     IRRIGATION AND DEBRIDEMENT SEBACEOUS CYST     on scalp   KNEE ARTHROSCOPY     x2   LAPAROSCOPY N/A 05/24/2021   Procedure: LAPAROSCOPY DIAGNOSTIC;  Surgeon: Griselda Miner, MD;  [facility];  Service: General;  Laterality: N/A;   LAPAROTOMY N/A 05/24/2021   Procedure: EXPLORATORY LAPAROTOMY;  Surgeon: Griselda Miner, MD;  Location: [facility] OR;  Service: General;  Laterality:  N/A;   LUMBAR FUSION     POSTERIOR FUSION CERVICAL SPINE     TONSILLECTOMY      Current Medications: Current Meds  Medication Sig   Azilsartan-Chlorthalidone 40-25 MG TABS Take 1 tablet by mouth daily.   Blood Pressure Monitoring (BLOOD PRESSURE CUFF) MISC XL Adult blood pressure cuff   carvedilol (COREG) 25 MG tablet TAKE 1 TABLET(25 MG) BY MOUTH TWICE DAILY WITH A MEAL   cyclobenzaprine (FLEXERIL) 10 MG tablet Take 10 mg by mouth 3 (three) times daily.   ibuprofen (ADVIL) 200 MG tablet Take 400 mg by mouth every 6 (six) hours as needed for fever, headache or mild pain.   NOREL AD 4-10-325 MG TABS Take 1 tablet by mouth daily.    ondansetron (ZOFRAN-ODT) 4 MG disintegrating tablet Take 1 tablet (4 mg total) by mouth every 8 (eight) hours as needed for nausea or vomiting.   potassium chloride SA (KLOR-CON M) 20 MEQ tablet Take 1 tablet (20 mEq total) by mouth daily.   rosuvastatin (CRESTOR) 10 MG tablet Take 1 tablet (10 mg total) by mouth daily.   sildenafil (VIAGRA) 100 MG tablet TAKE 1/2 TO 1 TABLET BY MOUTH AS NEEDED. Defer further refills to PCP.   [DISCONTINUED] meclizine (ANTIVERT) 25 MG tablet Take 1 tablet (25 mg total) by mouth 3 (three) times daily as needed for dizziness.     Allergies:   Patient has no known allergies.   Social History   Socioeconomic History   Marital status: Single    Spouse name: Not on file   Number of children: Not on file   Years of education: Not on file   Highest education level: Not on file  Occupational History   Occupation: Disabled  Tobacco Use   Smoking status: Former    Packs/day: 0.50    Years: 6.00    Total pack years: 3.00    Types: Cigarettes    Quit date: 2013    Years since quitting: 10.6   Smokeless tobacco: Never  Vaping Use   Vaping Use: Never used  Substance and Sexual Activity   Alcohol use: Yes    Comment: A glass of wine   Drug use: No   Sexual activity: Not on file  Other Topics Concern   Not on file  Social History Narrative   Not on file   Social Determinants of Health   Financial Resource Strain: Not on file  Food Insecurity: Not on file  Transportation Needs: Not on file  Physical Activity: Not on file  Stress: Not on file  Social Connections: Not on file     Family History: No family history of heart disease  ROS:   Please see the history of present illness.    All other systems reviewed and are negative.  EKGs/Labs/Other Studies Reviewed:    The following studies were reviewed today:  EKG:   04/04/21: NSR, rate 50, no PVCs 01/02/2021- The ekg ordered today demonstrates normal sinus rhythm, rate 62, no PVC's  10/01/2020-  The ekg ordered demonstrates sinus rhythm, rate 93, PVCs  Recent Labs: 05/23/2021: ALT 23 10/21/2021: Magnesium 2.1 11/06/2021: BUN 8; Creatinine, Ser 0.80; Hemoglobin 14.5; Platelets 126; Potassium 3.6; Sodium 136  Recent Lipid Panel    Component Value Date/Time   CHOL 113 10/21/2021 1341   TRIG 126 10/21/2021 1341   HDL 30 (L) 10/21/2021 1341   CHOLHDL 3.8 10/21/2021 1341   LDLCALC 60 10/21/2021 1341    Physical Exam:    VS:  BP 111/85   Pulse 68   Ht 6' (1.829 m)   Wt 282 lb 12.8 oz (128.3 kg)   SpO2 96%   BMI 38.35 kg/m     Wt Readings from Last 3 Encounters:  04/28/22 282 lb 12.8 oz (128.3 kg)  01/03/22 266 lb 12.8 oz (121 kg)  12/13/21 260 lb (117.9 kg)     GEN:  Well nourished, well developed in no acute distress HEENT: Normal NECK: No JVD CARDIAC: RRR, no murmurs, rubs, gallops RESPIRATORY:  Clear to auscultation without rales, wheezing or rhonchi  ABDOMEN: Soft, non-tender, non-distended MUSCULOSKELETAL:  No edema; No deformity  SKIN: Warm and dry NEUROLOGIC:  Alert and oriented x 3 PSYCHIATRIC:  Normal affect    Cardiac monitor 08/21/19: Frequent ventricular ectopy (23% of beats) 120 episodes of SVT, longest lasting 59 seconds at 152 bpm One 4 beat episode of NSVT Patient triggered events corresponded to sinus rhythm with ventricular ectopy, incluging bigeminy   4 days of data recorded on Zio monitor. Patient had a min HR of 46 bpm, max HR of 207 bpm, and avg HR of 77 bpm. Predominant underlying rhythm was Sinus Rhythm. No atrial fibrillation, high degree block, or pauses noted. There was one 4 beat run of NSVT and 120 runs of SVT, longest lasting 59 seconds at 152 bpm.  Isolated atrial was ectopy was rare (<1%).  Very frequent ventricular ectopy (23% of beats).  Episodes of bigeminy (longest lasting 132 seconds) and trigeminy (longest lasting 98 seconds).  There were 2 triggered events, corresponding to sinus rhythm with ventricular bigeminy.   No significant  arrhythmias detected.  CTA chest 08/16/19: Aneurysmal dilatation of the ascending thoracic aorta up to 47 mm. Recommend semi-annual imaging followup by CTA or MRA and referral to cardiothoracic surgery if not already obtained. This recommendation follows 2010 ACCF/AHA/AATS/ACR/ASA/SCA/SCAI/SIR/STS/SVM Guidelines for the Diagnosis and Management of Patients With Thoracic Aortic Disease. Circulation. 2010; 121: Z224-M250. Aortic aneurysm NOS (ICD10-I71.9)   Lexiscan Myoview 08/18/2019: The study is normal. This is a low risk study.   Normal pharmacologic nuclear stress test with no evidence for prior infarct or ischemia. LVEF not calculated. Frequent PVCs.  TTE 07/28/19:  1. Left ventricular ejection fraction, by visual estimation, is 50 to 55%. The left ventricle has low normal function. There is mildly increased left ventricular hypertrophy.  2. Mildly dilated left ventricular internal cavity size.  3. The left ventricle demonstrates regional wall motion abnormalities. Septal hypokinesis  4. Global right ventricle has normal systolic function.The right ventricular size is normal. No increase in right ventricular wall thickness.  5. Left atrial size was normal.  6. Right atrial size was normal.  7. The mitral valve is normal in structure. No evidence of mitral valve regurgitation.  8. The tricuspid valve is normal in structure. Tricuspid valve regurgitation is not demonstrated.  9. The aortic valve is tricuspid. Aortic valve regurgitation is not visualized. No evidence of aortic valve sclerosis or stenosis. 10. The inferior vena cava is normal in size with greater than 50% respiratory variability, suggesting right atrial pressure of 3 mmHg. 11. Aneurysm of the ascending aorta, measuring 48 mm.  ASSESSMENT:    1. Aortic aneurysm, unspecified portion of aorta, unspecified whether ruptured (HCC)   2. Primary hypertension   3. Frequent PVCs   4. Hyperlipidemia, unspecified  hyperlipidemia type   5. CAD in native artery        PLAN:    Hypertension: Previously was on amlodipine 5 mg daily, carvedilol  25 mg twice daily, spironolactone 25 mg daily, and azilsartan-chlorthalidone 40-25 mg.  Given aortic aneurysm, recommend goal SBP less than 120.  Has improved significantly since starting CPAP and with weight loss, have been able to cut back on regimen.   -Currently on azilsartan-chlorthalidone 40-25 mg daily, carvedilol 25 mg BID.  Appears controlled.  Check BMET, magnesium  PVCs:  frequent PVCs (23% of beats).  Symptomatic episodes on monitor appear to correspond to episodes of bigeminy.   TTE shows low normal systolic function (EF ~50%), septal hypokinesis.  No evidence of ischemia on Myoview.  Seen by EP, Dr Ladona Ridgel recommended titrating up coreg, with repeat monitor in 11/11/2019 showed significant improvement in PVC burden (5.3% of beats).  No evidence of sarcoidosis on cardiac MRI. -Significantly improved with carvedilol, will continue carvedilol 25 mg twice daily  Aortic aneurysm: 4.7 cm ascending aortic aneurysm on CTA chest 12/18.  Repeat CTA chest 12/09/19 showed stable aneurysm, measured 4.5 cm.  Follows with cardiothoracic surgery, 66-month follow-up CT on 06/11/2020 showed stable aneurysm at 4.7 cm.  CTA 11/06/21 showed aneurysm measured 5.0 cm.   -Follows with cardiothoracic surgery, recommended repeat CTA in 6 months (04/2022), will order CTA  CAD: Reports atypical chest pain.  Given frequent PVCs and hypokinesis seen on TTE, Lexiscan Myoview was done on 08/18/19, which showed no evidence of ischemia.  Coronary CTA on 12/08/2019 showed minimal nonobstructive CAD in mid LAD, calcium score 19 (84th percentile). -Continue rosuvastatin 10 mg daily.  LDL at goal less than 70  Hyperlipidemia: LDL 113 on 01/31/2020.  Started rosuvastatin 10 mg daily, LDL 60 on 10/21/2021  OSA: Encouraged compliance with CPAP  T2DM: On glipizide, A1c 8.7% on 05/07/2021    RTC in 6  months    Medication Adjustments/Labs and Tests Ordered: Current medicines are reviewed at length with the patient today.  Concerns regarding medicines are outlined above.  Orders Placed This Encounter  Procedures   CT ANGIO CHEST AORTA W/CM & OR WO/CM   Basic metabolic panel   Magnesium    No orders of the defined types were placed in this encounter.    Patient Instructions  Medication Instructions:  Your physician recommends that you continue on your current medications as directed. Please refer to the Current Medication list given to you today.  *If you need a refill on your cardiac medications before your next appointment, please call your pharmacy*   Lab Work: BMET and MAGNESIUM  If you have labs (blood work) drawn today and your tests are completely normal, you will receive your results only by: MyChart Message (if you have MyChart) OR A paper copy in the mail If you have any lab test that is abnormal or we need to change your treatment, we will call you to review the results.   Testing/Procedures: Non-Cardiac CT scanning, (CAT scanning), is a noninvasive, special x-ray that produces cross-sectional images of the body using x-rays and a computer. CT scans help physicians diagnose and treat medical conditions. For some CT exams, a contrast material is used to enhance visibility in the area of the body being studied. CT scans provide greater clarity and reveal more details than regular x-ray exams.   Follow-Up: At [facility], you and your health needs are our priority.  As part of our continuing mission to provide you with exceptional heart care, we have created designated Provider Care Teams.  These Care Teams include your primary Cardiologist (physician) and Advanced Practice Providers (APPs -  Physician Assistants  and Nurse Practitioners) who all work together to provide you with the care you need, when you need it.  We recommend signing up for the patient  portal called "MyChart".  Sign up information is provided on this After Visit Summary.  MyChart is used to connect with patients for Virtual Visits (Telemedicine).  Patients are able to view lab/test results, encounter notes, upcoming appointments, etc.  Non-urgent messages can be sent to your provider as well.   To learn more about what you can do with MyChart, go to [email or web address].    Your next appointment:   6 Months  The format for your next appointment:   In Person  Provider:   Little Ishikawahristopher L Naysa Puskas, MD         Signed, Little Ishikawahristopher L Amor Hyle, MD  04/28/2022 11:11 PM    [facility]

## 2022-04-28 ENCOUNTER — Encounter: Payer: Self-pay | Admitting: Cardiology

## 2022-04-28 ENCOUNTER — Ambulatory Visit: Payer: Medicare Other | Attending: Cardiology | Admitting: Cardiology

## 2022-04-28 VITALS — BP 111/85 | HR 68 | Ht 72.0 in | Wt 282.8 lb

## 2022-04-28 DIAGNOSIS — I493 Ventricular premature depolarization: Secondary | ICD-10-CM | POA: Diagnosis not present

## 2022-04-28 DIAGNOSIS — I10 Essential (primary) hypertension: Secondary | ICD-10-CM

## 2022-04-28 DIAGNOSIS — I2510 Atherosclerotic heart disease of native coronary artery without angina pectoris: Secondary | ICD-10-CM

## 2022-04-28 DIAGNOSIS — I719 Aortic aneurysm of unspecified site, without rupture: Secondary | ICD-10-CM | POA: Diagnosis not present

## 2022-04-28 DIAGNOSIS — E785 Hyperlipidemia, unspecified: Secondary | ICD-10-CM | POA: Diagnosis not present

## 2022-04-28 NOTE — Patient Instructions (Signed)
Medication Instructions:  Your physician recommends that you continue on your current medications as directed. Please refer to the Current Medication list given to you today.  *If you need a refill on your cardiac medications before your next appointment, please call your pharmacy*   Lab Work: BMET and MAGNESIUM  If you have labs (blood work) drawn today and your tests are completely normal, you will receive your results only by: MyChart Message (if you have MyChart) OR A paper copy in the mail If you have any lab test that is abnormal or we need to change your treatment, we will call you to review the results.   Testing/Procedures: Non-Cardiac CT scanning, (CAT scanning), is a noninvasive, special x-ray that produces cross-sectional images of the body using x-rays and a computer. CT scans help physicians diagnose and treat medical conditions. For some CT exams, a contrast material is used to enhance visibility in the area of the body being studied. CT scans provide greater clarity and reveal more details than regular x-ray exams.   Follow-Up: At [facility], you and your health needs are our priority.  As part of our continuing mission to provide you with exceptional heart care, we have created designated Provider Care Teams.  These Care Teams include your primary Cardiologist (physician) and Advanced Practice Providers (APPs -  Physician Assistants and Nurse Practitioners) who all work together to provide you with the care you need, when you need it.  We recommend signing up for the patient portal called "MyChart".  Sign up information is provided on this After Visit Summary.  MyChart is used to connect with patients for Virtual Visits (Telemedicine).  Patients are able to view lab/test results, encounter notes, upcoming appointments, etc.  Non-urgent messages can be sent to your provider as well.   To learn more about what you can do with MyChart, go to [email or web address].     Your next appointment:   6 Months  The format for your next appointment:   In Person  Provider:   Little Ishikawa, MD

## 2022-04-29 ENCOUNTER — Encounter: Payer: Self-pay | Admitting: Cardiology

## 2022-04-29 LAB — BASIC METABOLIC PANEL
BUN/Creatinine Ratio: 16 (ref 9–20)
BUN: 13 mg/dL (ref 6–24)
CO2: 26 mmol/L (ref 20–29)
Calcium: 9.1 mg/dL (ref 8.7–10.2)
Chloride: 101 mmol/L (ref 96–106)
Creatinine, Ser: 0.79 mg/dL (ref 0.76–1.27)
Glucose: 100 mg/dL — ABNORMAL HIGH (ref 70–99)
Potassium: 3.8 mmol/L (ref 3.5–5.2)
Sodium: 140 mmol/L (ref 134–144)
eGFR: 107 mL/min/{1.73_m2} (ref >59)

## 2022-04-29 LAB — MAGNESIUM: Magnesium: 2.3 mg/dL (ref 1.6–2.3)

## 2022-05-13 ENCOUNTER — Encounter (HOSPITAL_BASED_OUTPATIENT_CLINIC_OR_DEPARTMENT_OTHER): Payer: Self-pay

## 2022-05-13 ENCOUNTER — Ambulatory Visit (HOSPITAL_BASED_OUTPATIENT_CLINIC_OR_DEPARTMENT_OTHER)
Admission: RE | Admit: 2022-05-13 | Discharge: 2022-05-13 | Disposition: A | Discharge status: Home / Self Care | Payer: Medicare Other | Source: Ambulatory Visit | Attending: Cardiology | Admitting: Cardiology

## 2022-05-13 DIAGNOSIS — I719 Aortic aneurysm of unspecified site, without rupture: Secondary | ICD-10-CM | POA: Insufficient documentation

## 2022-05-13 MED ORDER — IOHEXOL 350 MG/ML SOLN
100.0000 mL | Freq: Once | INTRAVENOUS | Status: AC | PRN
  Administered 2022-05-13: 80 mL via INTRAVENOUS

## 2022-05-14 ENCOUNTER — Encounter: Payer: Self-pay | Admitting: Cardiology

## 2022-06-05 ENCOUNTER — Ambulatory Visit: Payer: Medicare Other | Admitting: Surgery

## 2022-06-05 ENCOUNTER — Ambulatory Visit: Payer: Medicare Other | Admitting: Thoracic Surgery (Cardiothoracic Vascular Surgery)

## 2022-06-05 VITALS — BP 119/86 | HR 60 | Resp 20 | Ht 72.0 in | Wt 288.0 lb

## 2022-06-05 DIAGNOSIS — I7121 Aneurysm of the ascending aorta, without rupture: Secondary | ICD-10-CM

## 2022-06-06 NOTE — Progress Notes (Signed)
[address]       [place] [address]             [phone]        FARHAN JEAN [facility] Medical Record #[id number] Date of Birth: 1971/06/20  Referring: Little Ishikawa* Primary Care: Maryagnes Amos, FNP Primary Cardiologist:Christopher Karlyne Greenspan, MD  Chief Complaint:    Chief Complaint  Patient presents with   Thoracic Aortic Aneurysm    1 year f/u with Chest CTA 05/13/22     History of Present Illness:     51 year old male with a history of an ascending aortic aneurysm presents to review results of his CT chest.  He has no complaints today.     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   Eval Dr Trey Sailors, Neurosurgery 10/13   Chest pain    Depression    High cholesterol    Hypertension    Osteoarthritis    Osteoarthritis    Overweight    SOB (shortness of breath)    Thrombocytopenia (HCC) 07/07/2012   138,000 08/26/11; 98,000 06/29/12    Past Surgical History:  Procedure Laterality Date   ADENOIDECTOMY     ANTERIOR FUSION CERVICAL SPINE     APPENDECTOMY     BOWEL RESECTION N/A 05/24/2021   Procedure: SMALL BOWEL RESECTION;  Surgeon: Griselda Miner, MD;  Location: [facility] OR;  Service: General;  Laterality: N/A;   HERNIA REPAIR     IRRIGATION AND DEBRIDEMENT SEBACEOUS CYST     on scalp   KNEE ARTHROSCOPY     x2   LAPAROSCOPY N/A 05/24/2021   Procedure: LAPAROSCOPY DIAGNOSTIC;  Surgeon: Griselda Miner, MD;  Location: [facility] OR;  Service: General;  Laterality: N/A;   LAPAROTOMY N/A 05/24/2021   Procedure: EXPLORATORY LAPAROTOMY;  Surgeon: Griselda Miner, MD;  Location: [facility] OR;  Service: General;  Laterality: N/A;   LUMBAR FUSION     POSTERIOR FUSION CERVICAL SPINE     TONSILLECTOMY       Social History   Tobacco Use  Smoking Status Former   Packs/day: 0.50   Years: 6.00   Total pack years: 3.00   Types: Cigarettes   Quit date: 2013   Years since  quitting: 10.8  Smokeless Tobacco Never    Social History   Substance and Sexual Activity  Alcohol Use Yes   Comment: A glass of wine     No Known Allergies   Current Outpatient Medications  Medication Sig Dispense Refill   Azilsartan-Chlorthalidone 40-25 MG TABS Take 1 tablet by mouth daily. 90 tablet 3   Blood Pressure Monitoring (BLOOD PRESSURE CUFF) MISC XL Adult blood pressure cuff 1 each 0   carvedilol (COREG) 25 MG tablet TAKE 1 TABLET(25 MG) BY MOUTH TWICE DAILY WITH A MEAL 180 tablet 3   cyclobenzaprine (FLEXERIL) 10 MG tablet Take 10 mg by mouth 3 (three) times daily.     ibuprofen (ADVIL) 200 MG tablet Take 400 mg by mouth every 6 (six) hours as needed for fever, headache or mild pain.     NOREL AD 4-10-325 MG TABS Take 1 tablet by mouth daily.     ondansetron (ZOFRAN-ODT) 4 MG disintegrating tablet Take 1 tablet (4 mg total) by mouth every 8 (eight) hours as needed for nausea or vomiting. 20 tablet 0   potassium chloride SA (KLOR-CON M) 20  MEQ tablet Take 1 tablet (20 mEq total) by mouth daily. 90 tablet 3   rosuvastatin (CRESTOR) 10 MG tablet Take 1 tablet (10 mg total) by mouth daily. 90 tablet 1   sildenafil (VIAGRA) 100 MG tablet TAKE 1/2 TO 1 TABLET BY MOUTH AS NEEDED. Defer further refills to PCP. 10 tablet 0   No current facility-administered medications for this visit.    (Not in a hospital admission)   Family History  Problem Relation Age of Onset   Diabetes Mother    Hypertension Mother    Hyperlipidemia Mother    Stroke Mother    Anxiety disorder Mother    Obesity Mother    Hypertension Father    Hyperlipidemia Father    Colon cancer Neg Hx    Colon polyps Neg Hx    Esophageal cancer Neg Hx    Liver cancer Neg Hx      Review of Systems:   Review of Systems  Constitutional: Negative.   Cardiovascular: Negative.       Physical Exam: BP 119/86   Pulse 60   Resp 20   Ht 6' (1.829 m)   Wt 288 lb (130.6 kg)   SpO2 96% Comment: RA  BMI  39.06 kg/m  Physical Exam Constitutional:      Appearance: Normal appearance. He is obese.  Cardiovascular:     Rate and Rhythm: Normal rate.  Abdominal:     General: There is no distension.  Musculoskeletal:        General: Normal range of motion.     Cervical back: Normal range of motion.  Skin:    General: Skin is warm and dry.  Neurological:     Mental Status: He is alert.       Diagnostic Studies & Laboratory data:    CT chest: IMPRESSION: 1. Stable ascending aortic aneurysm measuring 4.7 cm. Ascending thoracic aortic aneurysm. Recommend semi-annual imaging followup by CTA or MRA and referral to cardiothoracic surgery if not already obtained. This recommendation follows 2010 ACCF/AHA/AATS/ACR/ASA/SCA/SCAI/SIR/STS/SVM Guidelines for the Diagnosis and Management of Patients With Thoracic Aortic Disease. Circulation. 2010; 121: U132-G401. Aortic aneurysm NOS (ICD10-I71.9) 2. No other acute cardiopulmonary process. I have independently reviewed the above radiologic studies and discussed with the patient   Recent Lab Findings: Lab Results  Component Value Date   WBC 5.4 11/06/2021   HGB 14.5 11/06/2021   HCT 45.3 11/06/2021   PLT 126 (L) 11/06/2021   GLUCOSE 100 (H) 04/28/2022   CHOL 113 10/21/2021   TRIG 126 10/21/2021   HDL 30 (L) 10/21/2021   LDLCALC 60 10/21/2021   ALT 23 05/23/2021   AST 23 05/23/2021   NA 140 04/28/2022   K 3.8 04/28/2022   CL 101 04/28/2022   CREATININE 0.79 04/28/2022   BUN 13 04/28/2022   CO2 26 04/28/2022   TSH 1.010 04/05/2020   HGBA1C 6.6 (H) 04/05/2020      Assessment / Plan:   51 year old male with a 4.7 cm ascending aortic aneurysm.  Echocardiogram shows a trileaflet valve without evidence of regurgitation.  We discussed the natural history and and risk factors for growth of ascending aortic aneurysms.  We covered the importance of smoking cessation, tight blood pressure control, refraining from lifting heavy objects, and  avoiding fluoroquinolones.  The patient is aware of signs and symptoms of aortic dissection and when to present to the emergency department.  We will continue surveillance and a repeat CT was ordered for 12 months.  I  spent 20 minutes counseling the patient face to face.   Corliss Skains 06/06/2022 3:59 PM

## 2022-06-23 ENCOUNTER — Ambulatory Visit: Payer: Medicare Other | Admitting: Primary Care

## 2022-06-23 ENCOUNTER — Telehealth: Payer: Self-pay | Admitting: Cardiovascular Disease

## 2022-06-23 ENCOUNTER — Telehealth: Payer: Self-pay | Admitting: Primary Care

## 2022-06-23 ENCOUNTER — Encounter: Payer: Self-pay | Admitting: Cardiovascular Disease

## 2022-06-23 ENCOUNTER — Encounter: Payer: Self-pay | Admitting: Primary Care

## 2022-06-23 VITALS — BP 124/72 | HR 62 | Temp 99.0°F | Ht 72.0 in | Wt 290.6 lb

## 2022-06-23 DIAGNOSIS — G4733 Obstructive sleep apnea (adult) (pediatric): Secondary | ICD-10-CM

## 2022-06-23 NOTE — Patient Instructions (Signed)
Orders: Get CPAP download from [facility]  Change CPAP pressure 5-15cm h20 (sleep study Jan 19th, 2021) Please enroll patient in [place] and provide with SD card   Follow-up: 3-4 weeks with Beth NP for CPAP compliance   CPAP and BIPAP Information CPAP and BIPAP are methods that use air pressure to keep your airways open and to help you breathe well. CPAP and BIPAP use different amounts of pressure. Your health care provider will tell you whether CPAP or BIPAP would be more helpful for you. CPAP stands for "continuous positive airway pressure." With CPAP, the amount of pressure stays the same while you breathe in (inhale) and out (exhale). BIPAP stands for "bi-level positive airway pressure." With BIPAP, the amount of pressure will be higher when you inhale and lower when you exhale. This allows you to take larger breaths. CPAP or BIPAP may be used in the hospital, or your health care provider may want you to use it at home. You may need to have a sleep study before your health care provider can order a machine for you to use at home. What are the advantages? CPAP or BIPAP can be helpful if you have: Sleep apnea. Chronic obstructive pulmonary disease (COPD). Heart failure. Medical conditions that cause muscle weakness, including muscular dystrophy or amyotrophic lateral sclerosis (ALS). Other problems that cause breathing to be shallow, weak, abnormal, or difficult. CPAP and BIPAP are most commonly used for obstructive sleep apnea (OSA) to keep the airways from collapsing when the muscles relax during sleep. What are the risks? Generally, this is a safe treatment. However, problems may occur, including: Irritated skin or skin sores if the mask does not fit properly. Dry or stuffy nose or nosebleeds. Dry mouth. Feeling gassy or bloated. Sinus or lung infection if the equipment is not cleaned properly. When should CPAP or BIPAP be used? In most cases, the mask only needs to be worn  during sleep. Generally, the mask needs to be worn throughout the night and during any daytime naps. People with certain medical conditions may also need to wear the mask at other times, such as when they are awake. Follow instructions from your health care provider about when to use the machine. What happens during CPAP or BIPAP?  Both CPAP and BIPAP are provided by a small machine with a flexible plastic tube that attaches to a plastic mask that you wear. Air is blown through the mask into your nose or mouth. The amount of pressure that is used to blow the air can be adjusted on the machine. Your health care provider will set the pressure setting and help you find the best mask for you. Tips for using the mask Because the mask needs to be snug, some people feel trapped or closed-in (claustrophobic) when first using the mask. If you feel this way, you may need to get used to the mask. One way to do this is to hold the mask loosely over your nose or mouth and then gradually apply the mask more snugly. You can also gradually increase the amount of time that you use the mask. Masks are available in various types and sizes. If your mask does not fit well, talk with your health care provider about getting a different one. Some common types of masks include: Full face masks, which fit over the mouth and nose. Nasal masks, which fit over the nose. Nasal pillow or prong masks, which fit into the nostrils. If you are using a mask  that fits over your nose and you tend to breathe through your mouth, a chin strap may be applied to help keep your mouth closed. Use a skin barrier to protect your skin as told by your health care provider. Some CPAP and BIPAP machines have alarms that may sound if the mask comes off or develops a leak. If you have trouble with the mask, it is very important that you talk with your health care provider about finding a way to make the mask easier to tolerate. Do not stop using the mask.  There could be a negative impact on your health if you stop using the mask. Tips for using the machine Place your CPAP or BIPAP machine on a secure table or stand near an electrical outlet. Know where the on/off switch is on the machine. Follow instructions from your health care provider about how to set the pressure on your machine and when you should use it. Do not eat or drink while the CPAP or BIPAP machine is on. Food or fluids could get pushed into your lungs by the pressure of the CPAP or BIPAP. For home use, CPAP and BIPAP machines can be rented or purchased through home health care companies. Many different brands of machines are available. Renting a machine before purchasing may help you find out which particular machine works well for you. Your health insurance company may also decide which machine you may get. Keep the CPAP or BIPAP machine and attachments clean. Ask your health care provider for specific instructions. Check the humidifier if you have a dry stuffy nose or nosebleeds. Make sure it is working correctly. Follow these instructions at home: Take over-the-counter and prescription medicines only as told by your health care provider. Ask if you can take sinus medicine if your sinuses are blocked. Do not use any products that contain nicotine or tobacco. These products include cigarettes, chewing tobacco, and vaping devices, such as e-cigarettes. If you need help quitting, ask your health care provider. Keep all follow-up visits. This is important. Contact a health care provider if: You have redness or pressure sores on your head, face, mouth, or nose from the mask or head gear. You have trouble using the CPAP or BIPAP machine. You cannot tolerate wearing the CPAP or BIPAP mask. Someone tells you that you snore even when wearing your CPAP or BIPAP. Get help right away if: You have trouble breathing. You feel confused. Summary CPAP and BIPAP are methods that use air pressure  to keep your airways open and to help you breathe well. If you have trouble with the mask, it is very important that you talk with your health care provider about finding a way to make the mask easier to tolerate. Do not stop using the mask. There could be a negative impact to your health if you stop using the mask. Follow instructions from your health care provider about when to use the machine. This information is not intended to replace advice given to you by your health care provider. Make sure you discuss any questions you have with your health care provider. Document Revised: 03/13/2021 Document Reviewed: 07/13/2020 Elsevier Patient Education  [place].

## 2022-06-23 NOTE — Telephone Encounter (Signed)
Pt would like a call back regarding recommendations on where he is able to get his sleep supplies. Pt states that the place he usually goes, is now closed. Please advise

## 2022-06-23 NOTE — Progress Notes (Signed)
@Patient  ID: , male    DOB: 06-10-1971, 51 y.o.   MRN: [id number]  Chief Complaint  Patient presents with   Consult    Referring provider: [id number], MD  HPI: 51 year old male, former smoker quit 2013.  Past medical history significant for hypertension, PVCs, OSA, small bowel obstruction, thrombocytopenia, mixed hyperlipidemia.   polysomnogram on September 06, 2019.  This revealed moderate sleep apnea with an AHI at 15.0 and RDI of 17.  He had severe sleep apnea during REM sleep with an AHI of 48/h and with supine sleep with an AHI of 56.8/h.  There was evidence for loud snoring and significant oxygen desaturation to a nadir of 77%.  He subsequently was referred for CPAP titration trial on October 18, 2019.  CPAP was titrated up to optimal pressure at 9 cm.  AHI was 0 with O2 nadir at 91%.   06/23/2022 Patient presents today for sleep consult. Dr. 13/01/2022 referred patient to [facility] pulmonary. Hx sleep apnea, on CPAP. Last sleep study was on in 2021. He has moderate OSA, AHI 15. CPAP titration on 10/18/19 showed optimal pressure was 9cm h20. He reports compliance with CPAP but reports waking up every 2 hours gasping for air. Feels he is not getting enough pressure. He mainly sleeps on his side. He wears full face mask, states that he is changing supplies regularly. He was given hydroxyzine for insomnia by PCP which has helped.    Received download from DME for CPAP compliance. Current pressure set 6-15cm h20. He has only worn CPAP one day out of the last month.   Sleep questionnaire Symptoms-   waking up frequently gasping for air  Prior sleep study- 2021 Bedtime- 9-11pm Time to fall asleep- 15 mins Nocturnal awakenings- 1-3 times  Out of bed/start of day- 9-9:30am Weight changes- down 30lbs, up 20lbs  Do you operate heavy machinery- no Do you currently wear CPAP- yes, ? 9cm h29  Do you current wear oxygen- No [facility]- 7  [facility] download 05/25/22-06/23/22 1/30  days (3%); 0 days > 4 hours Average usage days used 2 hours 13/6/23 Pressure 6-16cm h20 Airleaks 8.4L/min  AHI 4.9    No Known Allergies  Immunization History  Administered Date(s) Administered   Janssen (J&J) SARS-COV-2 Vaccination 08/02/2020   Moderna Sars-Covid-2 Vaccination 11/17/2019    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   Eval Dr 07/09/2012, Neurosurgery 10/13   Chest pain    Depression    High cholesterol    Hypertension    Osteoarthritis    Osteoarthritis    Overweight    SOB (shortness of breath)    Thrombocytopenia (HCC) 07/07/2012   138,000 08/26/11; 98,000 06/29/12    Tobacco History: Social History   Tobacco Use  Smoking Status Former   Packs/day: 0.50   Years: 6.00   Total pack years: 3.00   Types: Cigarettes   Quit date: 2013   Years since quitting: 10.8  Smokeless Tobacco Never   Counseling given: Not Answered   Outpatient Medications Prior to Visit  Medication Sig Dispense Refill   Azilsartan-Chlorthalidone 40-25 MG TABS Take 1 tablet by mouth daily. 90 tablet 3   Blood Pressure Monitoring (BLOOD PRESSURE CUFF) MISC XL Adult blood pressure cuff 1 each 0   carvedilol (COREG) 25 MG tablet TAKE 1 TABLET(25 MG) BY MOUTH TWICE DAILY WITH A MEAL 180 tablet 3   cyclobenzaprine (  FLEXERIL) 10 MG tablet Take 10 mg by mouth 3 (three) times daily.     ibuprofen (ADVIL) 200 MG tablet Take 400 mg by mouth every 6 (six) hours as needed for fever, headache or mild pain.     NOREL AD 4-10-325 MG TABS Take 1 tablet by mouth daily.     ondansetron (ZOFRAN-ODT) 4 MG disintegrating tablet Take 1 tablet (4 mg total) by mouth every 8 (eight) hours as needed for nausea or vomiting. 20 tablet 0   potassium chloride SA (KLOR-CON M) 20 MEQ tablet Take 1 tablet (20 mEq total) by mouth daily. 90 tablet 3   rosuvastatin (CRESTOR) 10 MG tablet Take 1 tablet (10 mg total) by mouth daily. 90 tablet 1   sildenafil  (VIAGRA) 100 MG tablet TAKE 1/2 TO 1 TABLET BY MOUTH AS NEEDED. Defer further refills to PCP. 10 tablet 0   No facility-administered medications prior to visit.   Review of Systems  Review of Systems  Constitutional: Negative.   HENT: Negative.    Respiratory: Negative.       Physical Exam  BP 124/72 (BP Location: Left Arm, Patient Position: Sitting, Cuff Size: Large)   Pulse 62   Temp 99 F (37.2 C) (Oral)   Ht 6' (1.829 m)   Wt 290 lb 9.6 oz (131.8 kg)   SpO2 95%   BMI 39.41 kg/m  Physical Exam Constitutional:      Appearance: Normal appearance.  HENT:     Head: Atraumatic.     Mouth/Throat:     Mouth: Mucous membranes are moist.     Pharynx: Oropharynx is clear.     Comments: Mallampati class III Cardiovascular:     Rate and Rhythm: Normal rate and regular rhythm.  Pulmonary:     Effort: Pulmonary effort is normal.     Breath sounds: Normal breath sounds.  Musculoskeletal:        General: Normal range of motion.  Skin:    General: Skin is warm and dry.  Neurological:     General: No focal deficit present.     Mental Status: He is alert and oriented to person, place, and time. Mental status is at baseline.  Psychiatric:        Mood and Affect: Mood normal.        Behavior: Behavior normal.        Thought Content: Thought content normal.        Judgment: Judgment normal.      Lab Results:  CBC    Component Value Date/Time   WBC 5.4 11/06/2021 1134   RBC 5.10 11/06/2021 1134   HGB 14.5 11/06/2021 1134   HGB 15.0 01/02/2021 1040   HGB 15.4 07/30/2012 0932   HCT 45.3 11/06/2021 1134   HCT 45.9 01/02/2021 1040   HCT 46.5 07/30/2012 0932   PLT 126 (L) 11/06/2021 1134   PLT 155 01/02/2021 1040   MCV 88.8 11/06/2021 1134   MCV 90 01/02/2021 1040   MCV 89.8 07/30/2012 0932   MCH 28.4 11/06/2021 1134   MCHC 32.0 11/06/2021 1134   RDW 13.7 11/06/2021 1134   RDW 12.7 01/02/2021 1040   RDW 14.1 07/30/2012 0932   LYMPHSABS 2.4 05/23/2021 1541    LYMPHSABS 2.6 04/05/2020 1541   LYMPHSABS 2.0 07/30/2012 0932   MONOABS 0.7 05/23/2021 1541   MONOABS 0.5 07/30/2012 0932   EOSABS 0.1 05/23/2021 1541   EOSABS 0.1 04/05/2020 1541   BASOSABS 0.0 05/23/2021 1541   BASOSABS 0.0  04/05/2020 1541   BASOSABS 0.0 07/30/2012 0932    BMET    Component Value Date/Time   NA 140 04/28/2022 1502   K 3.8 04/28/2022 1502   CL 101 04/28/2022 1502   CO2 26 04/28/2022 1502   GLUCOSE 100 (H) 04/28/2022 1502   GLUCOSE 133 (H) 11/06/2021 1134   BUN 13 04/28/2022 1502   CREATININE 0.79 04/28/2022 1502   CALCIUM 9.1 04/28/2022 1502   GFRNONAA >60 11/06/2021 1134   GFRAA 120 10/01/2020 1633    BNP No results found for: "BNP"  ProBNP No results found for: "PROBNP"  Imaging: No results found.   Assessment & Plan:   OSA (obstructive sleep apnea) - History moderate sleep apnea, currently on CPAP.  Last sleep study was done in 2021, AHI 15/hr.  He had a CPAP titration study in March 2021 that showed optimal pressure of 9 cm H2O. He reports issues with pressure setting, waking up every hour gasping for air. CPAP download today from DME company shows poor compliance.  Current pressure settings auto 6 - 15 cm H2O (12.1cm h20-95%); Residual AHI 4.9 an hour.  Minimal airleaks.  Encourage patient to consistently wear CPAP at night for 4 to 6 hours or longer. Avoid back sleeping position. Recommend patient have repeat CPAP titration study.   Martyn Ehrich, NP 06/23/2022

## 2022-06-23 NOTE — Assessment & Plan Note (Addendum)
-   History moderate sleep apnea, currently on CPAP.  Last sleep study was done in 2021, AHI 15/hr.  He had a CPAP titration study in March 2021 that showed optimal pressure of 9 cm H2O. He reports issues with pressure setting, waking up every hour gasping for air. CPAP download today from DME company shows poor compliance.  Current pressure settings auto 6 - 15 cm H2O (12.1cm h20-95%); Residual AHI 4.9 an hour.  Minimal airleaks.  Encourage patient to consistently wear CPAP at night for 4 to 6 hours or longer. Avoid back sleeping position. Recommend patient have repeat CPAP titration study.

## 2022-06-23 NOTE — Addendum Note (Signed)
Addended by: Martyn Ehrich on: 06/23/2022 05:26 PM   Modules accepted: Orders

## 2022-06-23 NOTE — Telephone Encounter (Signed)
Message has been sent to the patient with this information.

## 2022-06-23 NOTE — Telephone Encounter (Signed)
There is a download from patient's CPAP, he has not worn in the last month except for 1 day.  His current pressure is already set auto pressure 6 -15 cm H2O.  Not enough data here really to get much information.  I need him to wear his CPAP more consistently. If it is a pressure issue he should have CPAP titration study. Ill order.

## 2022-06-23 NOTE — Progress Notes (Signed)
Reviewed and agree with assessment/plan.   Shonna Deiter, MD [facility] 06/23/2022, 9:30 PM Pager:  [phone]  

## 2022-06-26 NOTE — Telephone Encounter (Signed)
Called and spoke with pt letting him know the info per BW and he verbalized understanding. Pt has been scheduled for cpap titration study. Pt stated that he was needing new supplies for his cpap so order has been placed. Nothing further needed.

## 2022-07-02 ENCOUNTER — Ambulatory Visit: Payer: Medicare Other | Admitting: Cardiovascular Disease

## 2022-07-15 ENCOUNTER — Ambulatory Visit: Payer: Medicare Other | Admitting: Primary Care

## 2022-07-15 ENCOUNTER — Encounter: Payer: Self-pay | Admitting: Primary Care

## 2022-07-15 VITALS — BP 118/66 | HR 65 | Temp 98.4°F | Ht 72.0 in | Wt 294.8 lb

## 2022-07-15 DIAGNOSIS — G4733 Obstructive sleep apnea (adult) (pediatric): Secondary | ICD-10-CM

## 2022-07-15 NOTE — Assessment & Plan Note (Signed)
History moderate sleep apnea, currently on CPAP.  Last sleep study was done in 2021, AHI 15/hr.  He had a CPAP titration study in March 2021 that showed optimal pressure of 9 cm H2O. He was having issues with pressure settings, he had poor compliance at that time. Recent download shows better usage. Pressure settings seem to be controlling his apneas. We will cancel CPAP titration study for now, he needs mask fitting with DME company for hybrid/dream wear full face cushion mask. FU in 6-8 weeks for compliance check in.

## 2022-07-15 NOTE — Progress Notes (Signed)
@Patient  ID: , male    DOB: 1971-08-12, 51 y.o.   MRN: [id number]  Chief Complaint  Patient presents with   Follow-up    Referring provider: [id number]  HPI: 51 year old male, former smoker quit 2013.  Past medical history significant for hypertension, PVCs, OSA, small bowel obstruction, thrombocytopenia, mixed hyperlipidemia.  Polysomnogram on September 06, 2019.  This revealed moderate sleep apnea with an AHI at 15.0 and RDI of 17.  He had severe sleep apnea during REM sleep with an AHI of 48/h and with supine sleep with an AHI of 56.8/h.  There was evidence for loud snoring and significant oxygen desaturation to a nadir of 77%.  He subsequently was referred for CPAP titration trial on October 18, 2019.  CPAP was titrated up to optimal pressure at 9 cm.  AHI was 0 with O2 nadir at 91%.   Previous [facility] pulmonary encounter:  06/23/2022 Patient presents today for sleep consult. Dr. 13/01/2022 referred patient to [facility] pulmonary. Hx sleep apnea, on CPAP. Last sleep study was on in 2021. He has moderate OSA, AHI 15. CPAP titration on 10/18/19 showed optimal pressure was 9cm h20. He reports compliance with CPAP but reports waking up every 2 hours gasping for air. Feels he is not getting enough pressure. He mainly sleeps on his side. He wears full face mask, states that he is changing supplies regularly. He was given hydroxyzine for insomnia by PCP which has helped.    Received download from DME for CPAP compliance. Current pressure set 6-15cm h20. He has only worn CPAP one day out of the last month.   Sleep questionnaire Symptoms-   waking up frequently gasping for air  Prior sleep study- 2021 Bedtime- 9-11pm Time to fall asleep- 15 mins Nocturnal awakenings- 1-3 times  Out of bed/start of day- 9-9:30am Weight changes- down 30lbs, up 20lbs  Do you operate heavy machinery- no Do you currently wear CPAP- yes, ? 9cm h29  Do you current wear oxygen- No [facility]-  7  Airview download 05/25/22-06/23/22 1/30 days (3%); 0 days > 4 hours Average usage days used 2 hours 13/6/23 Pressure 6-16cm h20 Airleaks 8.4L/min  AHI 4.9    07/15/2022 FU OSA. Patient is doing alright. He has been wearing CPAP more recently, current pressure settings look to be working ok. He was ordered for titration study but we discussed holding off right now. He is having issues with mask fit, would like to try the dream wear/hybrid full face mask. He is getting 5-6 hours of sleep a night. No significant daytime sleepiness.   Airview download 06/14/22-07/13/22 17/30 days used Average usage days used 4 hours 13 mins Pressure 5-15cm h20 (13.8cm h20-95%) Airleaks 7.2L/min (9%) AHI 3.5   No Known Allergies  Immunization History  Administered Date(s) Administered   Janssen (J&J) SARS-COV-2 Vaccination 08/02/2020   Moderna Sars-Covid-2 Vaccination 11/17/2019    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   Eval Dr 07/09/2012, Neurosurgery 10/13   Chest pain    Depression    High cholesterol    Hypertension    Osteoarthritis    Osteoarthritis    Overweight    SOB (shortness of breath)    Thrombocytopenia (HCC) 07/07/2012   138,000 08/26/11; 98,000 06/29/12    Tobacco History: Social History   Tobacco Use  Smoking Status Former   Packs/day: 0.50   Years: 6.00   Total  pack years: 3.00   Types: Cigarettes   Quit date: 2013   Years since quitting: 10.9  Smokeless Tobacco Never   Counseling given: Not Answered   Outpatient Medications Prior to Visit  Medication Sig Dispense Refill   Azilsartan-Chlorthalidone 40-25 MG TABS Take 1 tablet by mouth daily. 90 tablet 3   Blood Pressure Monitoring (BLOOD PRESSURE CUFF) MISC XL Adult blood pressure cuff 1 each 0   carvedilol (COREG) 25 MG tablet TAKE 1 TABLET(25 MG) BY MOUTH TWICE DAILY WITH A MEAL 180 tablet 3   cyclobenzaprine (FLEXERIL) 10 MG tablet Take 10 mg  by mouth 3 (three) times daily.     ibuprofen (ADVIL) 200 MG tablet Take 400 mg by mouth every 6 (six) hours as needed for fever, headache or mild pain.     NOREL AD 4-10-325 MG TABS Take 1 tablet by mouth daily.     ondansetron (ZOFRAN-ODT) 4 MG disintegrating tablet Take 1 tablet (4 mg total) by mouth every 8 (eight) hours as needed for nausea or vomiting. 20 tablet 0   potassium chloride SA (KLOR-CON M) 20 MEQ tablet Take 1 tablet (20 mEq total) by mouth daily. 90 tablet 3   rosuvastatin (CRESTOR) 10 MG tablet Take 1 tablet (10 mg total) by mouth daily. 90 tablet 1   sildenafil (VIAGRA) 100 MG tablet TAKE 1/2 TO 1 TABLET BY MOUTH AS NEEDED. Defer further refills to PCP. 10 tablet 0   No facility-administered medications prior to visit.      Review of Systems  Review of Systems  Constitutional: Negative.  Negative for fatigue.  HENT: Negative.    Respiratory: Negative.    Cardiovascular: Negative.   Psychiatric/Behavioral:  Negative for sleep disturbance.    Physical Exam  BP 118/66 (BP Location: Left Arm, Patient Position: Sitting, Cuff Size: Large)   Pulse 65   Temp 98.4 F (36.9 C) (Oral)   Ht 6' (1.829 m)   Wt 294 lb 12.8 oz (133.7 kg)   SpO2 98%   BMI 39.98 kg/m  Physical Exam Constitutional:      Appearance: Normal appearance.  HENT:     Head: Normocephalic and atraumatic.     Mouth/Throat:     Mouth: Mucous membranes are moist.     Pharynx: Oropharynx is clear.  Cardiovascular:     Rate and Rhythm: Normal rate and regular rhythm.  Pulmonary:     Effort: Pulmonary effort is normal.     Breath sounds: Normal breath sounds.  Musculoskeletal:        General: Normal range of motion.     Cervical back: Normal range of motion and neck supple.  Skin:    General: Skin is warm and dry.  Neurological:     General: No focal deficit present.     Mental Status: He is alert and oriented to person, place, and time. Mental status is at baseline.  Psychiatric:         Mood and Affect: Mood normal.        Behavior: Behavior normal.        Thought Content: Thought content normal.        Judgment: Judgment normal.      Lab Results:  CBC    Component Value Date/Time   WBC 5.4 11/06/2021 1134   RBC 5.10 11/06/2021 1134   HGB 14.5 11/06/2021 1134   HGB 15.0 01/02/2021 1040   HGB 15.4 07/30/2012 0932   HCT 45.3 11/06/2021 1134   HCT 45.9 01/02/2021  1040   HCT 46.5 07/30/2012 0932   PLT 126 (L) 11/06/2021 1134   PLT 155 01/02/2021 1040   MCV 88.8 11/06/2021 1134   MCV 90 01/02/2021 1040   MCV 89.8 07/30/2012 0932   MCH 28.4 11/06/2021 1134   MCHC 32.0 11/06/2021 1134   RDW 13.7 11/06/2021 1134   RDW 12.7 01/02/2021 1040   RDW 14.1 07/30/2012 0932   LYMPHSABS 2.4 05/23/2021 1541   LYMPHSABS 2.6 04/05/2020 1541   LYMPHSABS 2.0 07/30/2012 0932   MONOABS 0.7 05/23/2021 1541   MONOABS 0.5 07/30/2012 0932   EOSABS 0.1 05/23/2021 1541   EOSABS 0.1 04/05/2020 1541   BASOSABS 0.0 05/23/2021 1541   BASOSABS 0.0 04/05/2020 1541   BASOSABS 0.0 07/30/2012 0932    BMET    Component Value Date/Time   NA 140 04/28/2022 1502   K 3.8 04/28/2022 1502   CL 101 04/28/2022 1502   CO2 26 04/28/2022 1502   GLUCOSE 100 (H) 04/28/2022 1502   GLUCOSE 133 (H) 11/06/2021 1134   BUN 13 04/28/2022 1502   CREATININE 0.79 04/28/2022 1502   CALCIUM 9.1 04/28/2022 1502   GFRNONAA >60 11/06/2021 1134   GFRAA 120 10/01/2020 1633    BNP No results found for: "BNP"  ProBNP No results found for: "PROBNP"  Imaging: No results found.   Assessment & Plan:   OSA (obstructive sleep apnea) History moderate sleep apnea, currently on CPAP.  Last sleep study was done in 2021, AHI 15/hr.  He had a CPAP titration study in March 2021 that showed optimal pressure of 9 cm H2O. He was having issues with pressure settings, he had poor compliance at that time. Recent download shows better usage. Pressure settings seem to be controlling his apneas. We will cancel CPAP  titration study for now, he needs mask fitting with DME company for hybrid/dream wear full face cushion mask. FU in 6-8 weeks for compliance check in.      Glenford Bayley, NP 07/15/2022

## 2022-07-15 NOTE — Patient Instructions (Addendum)
Recommendations: - Once you get new CPAP mask aim to wear every night for a minimum of 4 or more hours  Orders: - Cancel titration study - DME order for mask fitting- dream wear hybrid/full face cushion mask   Follow-up: - 6-8 weeks with Beth NP for CPAP compliance check   CPAP and BIPAP Information CPAP and BIPAP are methods that use air pressure to keep your airways open and to help you breathe well. CPAP and BIPAP use different amounts of pressure. Your health care provider will tell you whether CPAP or BIPAP would be more helpful for you. CPAP stands for "continuous positive airway pressure." With CPAP, the amount of pressure stays the same while you breathe in (inhale) and out (exhale). BIPAP stands for "bi-level positive airway pressure." With BIPAP, the amount of pressure will be higher when you inhale and lower when you exhale. This allows you to take larger breaths. CPAP or BIPAP may be used in the hospital, or your health care provider may want you to use it at home. You may need to have a sleep study before your health care provider can order a machine for you to use at home. What are the advantages? CPAP or BIPAP can be helpful if you have: Sleep apnea. Chronic obstructive pulmonary disease (COPD). Heart failure. Medical conditions that cause muscle weakness, including muscular dystrophy or amyotrophic lateral sclerosis (ALS). Other problems that cause breathing to be shallow, weak, abnormal, or difficult. CPAP and BIPAP are most commonly used for obstructive sleep apnea (OSA) to keep the airways from collapsing when the muscles relax during sleep. What are the risks? Generally, this is a safe treatment. However, problems may occur, including: Irritated skin or skin sores if the mask does not fit properly. Dry or stuffy nose or nosebleeds. Dry mouth. Feeling gassy or bloated. Sinus or lung infection if the equipment is not cleaned properly. When should CPAP or BIPAP be  used? In most cases, the mask only needs to be worn during sleep. Generally, the mask needs to be worn throughout the night and during any daytime naps. People with certain medical conditions may also need to wear the mask at other times, such as when they are awake. Follow instructions from your health care provider about when to use the machine. What happens during CPAP or BIPAP?  Both CPAP and BIPAP are provided by a small machine with a flexible plastic tube that attaches to a plastic mask that you wear. Air is blown through the mask into your nose or mouth. The amount of pressure that is used to blow the air can be adjusted on the machine. Your health care provider will set the pressure setting and help you find the best mask for you. Tips for using the mask Because the mask needs to be snug, some people feel trapped or closed-in (claustrophobic) when first using the mask. If you feel this way, you may need to get used to the mask. One way to do this is to hold the mask loosely over your nose or mouth and then gradually apply the mask more snugly. You can also gradually increase the amount of time that you use the mask. Masks are available in various types and sizes. If your mask does not fit well, talk with your health care provider about getting a different one. Some common types of masks include: Full face masks, which fit over the mouth and nose. Nasal masks, which fit over the nose. Nasal pillow or prong  masks, which fit into the nostrils. If you are using a mask that fits over your nose and you tend to breathe through your mouth, a chin strap may be applied to help keep your mouth closed. Use a skin barrier to protect your skin as told by your health care provider. Some CPAP and BIPAP machines have alarms that may sound if the mask comes off or develops a leak. If you have trouble with the mask, it is very important that you talk with your health care provider about finding a way to make the  mask easier to tolerate. Do not stop using the mask. There could be a negative impact on your health if you stop using the mask. Tips for using the machine Place your CPAP or BIPAP machine on a secure table or stand near an electrical outlet. Know where the on/off switch is on the machine. Follow instructions from your health care provider about how to set the pressure on your machine and when you should use it. Do not eat or drink while the CPAP or BIPAP machine is on. Food or fluids could get pushed into your lungs by the pressure of the CPAP or BIPAP. For home use, CPAP and BIPAP machines can be rented or purchased through home health care companies. Many different brands of machines are available. Renting a machine before purchasing may help you find out which particular machine works well for you. Your health insurance company may also decide which machine you may get. Keep the CPAP or BIPAP machine and attachments clean. Ask your health care provider for specific instructions. Check the humidifier if you have a dry stuffy nose or nosebleeds. Make sure it is working correctly. Follow these instructions at home: Take over-the-counter and prescription medicines only as told by your health care provider. Ask if you can take sinus medicine if your sinuses are blocked. Do not use any products that contain nicotine or tobacco. These products include cigarettes, chewing tobacco, and vaping devices, such as e-cigarettes. If you need help quitting, ask your health care provider. Keep all follow-up visits. This is important. Contact a health care provider if: You have redness or pressure sores on your head, face, mouth, or nose from the mask or head gear. You have trouble using the CPAP or BIPAP machine. You cannot tolerate wearing the CPAP or BIPAP mask. Someone tells you that you snore even when wearing your CPAP or BIPAP. Get help right away if: You have trouble breathing. You feel  confused. Summary CPAP and BIPAP are methods that use air pressure to keep your airways open and to help you breathe well. If you have trouble with the mask, it is very important that you talk with your health care provider about finding a way to make the mask easier to tolerate. Do not stop using the mask. There could be a negative impact to your health if you stop using the mask. Follow instructions from your health care provider about when to use the machine. This information is not intended to replace advice given to you by your health care provider. Make sure you discuss any questions you have with your health care provider. Document Revised: 03/13/2021 Document Reviewed: 07/13/2020 Elsevier Patient Education  2023 [employer].

## 2022-07-21 ENCOUNTER — Encounter (HOSPITAL_BASED_OUTPATIENT_CLINIC_OR_DEPARTMENT_OTHER): Payer: Medicare Other | Admitting: Pulmonary Disease

## 2022-07-31 ENCOUNTER — Telehealth: Payer: Self-pay | Admitting: Primary Care

## 2022-07-31 NOTE — Telephone Encounter (Signed)
Office notes faxed to [facility] about desired mask that Beth recommends. Nothing further needed

## 2022-07-31 NOTE — Telephone Encounter (Signed)
Patient called to inform the nurse that [facility] is requesting more information from the doctor regarding his new mask and hose.  He stated that they said he needs to get this from the doctor before he can get his equipment.  Please advise and call patient to discuss further.  CB# [phone]

## 2022-08-26 ENCOUNTER — Ambulatory Visit: Payer: Medicare Other | Admitting: Primary Care

## 2022-10-06 ENCOUNTER — Other Ambulatory Visit: Payer: Self-pay

## 2022-10-06 ENCOUNTER — Emergency Department (HOSPITAL_BASED_OUTPATIENT_CLINIC_OR_DEPARTMENT_OTHER)
Admission: EM | Admit: 2022-10-06 | Discharge: 2022-10-06 | Disposition: A | Discharge status: Home / Self Care | Payer: Medicare Other | Attending: Emergency Medicine | Admitting: Emergency Medicine

## 2022-10-06 ENCOUNTER — Other Ambulatory Visit (HOSPITAL_BASED_OUTPATIENT_CLINIC_OR_DEPARTMENT_OTHER): Payer: Self-pay

## 2022-10-06 ENCOUNTER — Encounter (HOSPITAL_BASED_OUTPATIENT_CLINIC_OR_DEPARTMENT_OTHER): Payer: Self-pay | Admitting: Emergency Medicine

## 2022-10-06 DIAGNOSIS — R739 Hyperglycemia, unspecified: Secondary | ICD-10-CM | POA: Insufficient documentation

## 2022-10-06 DIAGNOSIS — R252 Cramp and spasm: Secondary | ICD-10-CM | POA: Insufficient documentation

## 2022-10-06 DIAGNOSIS — Z79899 Other long term (current) drug therapy: Secondary | ICD-10-CM | POA: Insufficient documentation

## 2022-10-06 DIAGNOSIS — I10 Essential (primary) hypertension: Secondary | ICD-10-CM | POA: Diagnosis not present

## 2022-10-06 DIAGNOSIS — M67431 Ganglion, right wrist: Secondary | ICD-10-CM | POA: Diagnosis not present

## 2022-10-06 DIAGNOSIS — M67432 Ganglion, left wrist: Secondary | ICD-10-CM | POA: Insufficient documentation

## 2022-10-06 LAB — URINALYSIS, ROUTINE W REFLEX MICROSCOPIC
Bacteria, UA: NONE SEEN
Bilirubin Urine: NEGATIVE
Glucose, UA: NEGATIVE mg/dL
Hgb urine dipstick: NEGATIVE
Ketones, ur: NEGATIVE mg/dL
Leukocytes,Ua: NEGATIVE
Nitrite: NEGATIVE
Protein, ur: 30 mg/dL — AB
Specific Gravity, Urine: 1.022 (ref 1.005–1.030)
pH: 6.5 (ref 5.0–8.0)

## 2022-10-06 LAB — CBC WITH DIFFERENTIAL/PLATELET
Abs Immature Granulocytes: 0.02 10*3/uL (ref 0.00–0.07)
Basophils Absolute: 0 10*3/uL (ref 0.0–0.1)
Basophils Relative: 0 %
Eosinophils Absolute: 0.1 10*3/uL (ref 0.0–0.5)
Eosinophils Relative: 1 %
HCT: 44.5 % (ref 39.0–52.0)
Hemoglobin: 14.5 g/dL (ref 13.0–17.0)
Immature Granulocytes: 0 %
Lymphocytes Relative: 32 %
Lymphs Abs: 2.3 10*3/uL (ref 0.7–4.0)
MCH: 28.5 pg (ref 26.0–34.0)
MCHC: 32.6 g/dL (ref 30.0–36.0)
MCV: 87.4 fL (ref 80.0–100.0)
Monocytes Absolute: 0.6 10*3/uL (ref 0.1–1.0)
Monocytes Relative: 8 %
Neutro Abs: 4.1 10*3/uL (ref 1.7–7.7)
Neutrophils Relative %: 59 %
Platelets: 183 10*3/uL (ref 150–400)
RBC: 5.09 MIL/uL (ref 4.22–5.81)
RDW: 13.8 % (ref 11.5–15.5)
WBC: 7.1 10*3/uL (ref 4.0–10.5)
nRBC: 0 % (ref 0.0–0.2)

## 2022-10-06 LAB — COMPREHENSIVE METABOLIC PANEL
ALT: 20 U/L (ref 0–44)
AST: 18 U/L (ref 15–41)
Albumin: 4.3 g/dL (ref 3.5–5.0)
Alkaline Phosphatase: 102 U/L (ref 38–126)
Anion gap: 9 (ref 5–15)
BUN: 13 mg/dL (ref 6–20)
CO2: 25 mmol/L (ref 22–32)
Calcium: 9.1 mg/dL (ref 8.9–10.3)
Chloride: 102 mmol/L (ref 98–111)
Creatinine, Ser: 0.84 mg/dL (ref 0.61–1.24)
GFR, Estimated: >60 mL/min (ref >60)
Glucose, Bld: 197 mg/dL — ABNORMAL HIGH (ref 70–99)
Potassium: 4 mmol/L (ref 3.5–5.1)
Sodium: 136 mmol/L (ref 135–145)
Total Bilirubin: 0.6 mg/dL (ref 0.3–1.2)
Total Protein: 7.6 g/dL (ref 6.5–8.1)

## 2022-10-06 LAB — MAGNESIUM: Magnesium: 1.9 mg/dL (ref 1.7–2.4)

## 2022-10-06 NOTE — ED Provider Notes (Signed)
[facility] Provider Note   CSN: [id number] Arrival date & time: 10/06/22  1224     History  Chief Complaint  Patient presents with   Spasms    Ricky [name] is a 52 y.o. male.  Patient is a 52 year old male who presents with some muscle cramping.  He says it has been going on for about 3 days.  It is in different areas.  Sometimes he feels like his hands cramp up and sometimes it is in his legs.  He had 1 earlier today in his chest when he turned in a muscle in his chest cramped up.  He currently denies any cramping.  He said he thought maybe his sodium was low and he started drinking some pickle juice which seems to help his symptoms.  He had some cramping last night and he drank some pickle juice and it went away.  He denies any other chest pain.  No shortness of breath.  No fevers.  No vomiting or diarrhea.  No urinary symptoms.  No numbness or weakness to his extremities.  On chart review, he has a history of a thoracic aortic aneurysm which is under surveillance by Dr. Kipp Brood, hypertension, hyperlipidemia.       Home Medications Prior to Admission medications   Medication Sig Start Date End Date Taking? Authorizing Provider  Azilsartan-Chlorthalidone 40-25 MG TABS Take 1 tablet by mouth daily. 04/14/22   Donato Heinz, MD  Blood Pressure Monitoring (BLOOD PRESSURE CUFF) MISC XL Adult blood pressure cuff 08/18/19   Donato Heinz, MD  carvedilol (COREG) 25 MG tablet TAKE 1 TABLET(25 MG) BY MOUTH TWICE DAILY WITH A MEAL 11/04/21   Donato Heinz, MD  cyclobenzaprine (FLEXERIL) 10 MG tablet Take 10 mg by mouth 3 (three) times daily. 05/29/21   [provider]  hydrOXYzine (VISTARIL) 50 MG capsule Take 50 mg by mouth daily as needed.    [provider]  ibuprofen (ADVIL) 200 MG tablet Take 400 mg by mouth every 6 (six) hours as needed for fever, headache or mild pain.    [provider]  NOREL AD 4-10-325 MG TABS Take 1 tablet by mouth daily. 09/16/21   [provider]  ondansetron (ZOFRAN-ODT) 4 MG disintegrating tablet Take 1 tablet (4 mg total) by mouth every 8 (eight) hours as needed for nausea or vomiting. 11/06/21   Regan Lemming, MD  potassium chloride SA (KLOR-CON M) 20 MEQ tablet Take 1 tablet (20 mEq total) by mouth daily. 11/12/21 11/07/22  Donato Heinz, MD  rosuvastatin (CRESTOR) 10 MG tablet Take 1 tablet (10 mg total) by mouth daily. 11/04/21   Donato Heinz, MD  sildenafil (VIAGRA) 100 MG tablet TAKE 1/2 TO 1 TABLET BY MOUTH AS NEEDED. Defer further refills to PCP. 04/04/21   Donato Heinz, MD      Allergies    Patient has no known allergies.    Review of Systems   Review of Systems  Constitutional:  Negative for chills, diaphoresis, fatigue and fever.  HENT:  Negative for congestion, rhinorrhea and sneezing.   Eyes: Negative.   Respiratory:  Negative for cough, chest tightness and shortness of breath.   Cardiovascular:  Negative for chest pain and leg swelling.  Gastrointestinal:  Negative for abdominal pain, blood in stool, diarrhea, nausea and vomiting.  Genitourinary:  Negative for difficulty urinating, flank pain, frequency and hematuria.  Musculoskeletal:  Positive for myalgias. Negative for arthralgias and back  pain.       Muscle cramping  Skin:  Negative for rash.  Neurological:  Negative for dizziness, speech difficulty, weakness, numbness and headaches.    Physical Exam Updated Vital Signs BP (!) 121/90 (BP Location: Right Arm)   Pulse 77   Temp 98 F (36.7 C) (Oral)   Resp 18   SpO2 97%  Physical Exam Constitutional:      Appearance: He is well-developed.  HENT:     Head: Normocephalic and atraumatic.  Eyes:     Pupils: Pupils are equal, round, and reactive to light.  Cardiovascular:     Rate and Rhythm: Normal rate and regular rhythm.     Heart sounds: Normal heart sounds.  Pulmonary:      Effort: Pulmonary effort is normal. No respiratory distress.     Breath sounds: Normal breath sounds. No wheezing or rales.  Chest:     Chest wall: No tenderness.  Abdominal:     General: Bowel sounds are normal.     Palpations: Abdomen is soft.     Tenderness: There is no abdominal tenderness. There is no guarding or rebound.  Musculoskeletal:        General: Normal range of motion.     Cervical back: Normal range of motion and neck supple.     Comments: Radial pulses are intact, pedal pulses are intact, no swelling to the extremities.  He has some small ganglion cyst to both of his wrists which are asymptomatic.  Lymphadenopathy:     Cervical: No cervical adenopathy.  Skin:    General: Skin is warm and dry.     Findings: No rash.  Neurological:     General: No focal deficit present.     Mental Status: He is alert and oriented to person, place, and time.     Comments: Motor 5 out of 5 all extremities, sensation grossly intact to light touch all extremities     ED Results / Procedures / Treatments   Labs (all labs ordered are listed, but only abnormal results are displayed) Labs Reviewed  COMPREHENSIVE METABOLIC PANEL - Abnormal; Notable for the following components:      Result Value   Glucose, Bld 197 (*)    All other components within normal limits  URINALYSIS, ROUTINE W REFLEX MICROSCOPIC - Abnormal; Notable for the following components:   Protein, ur 30 (*)    All other components within normal limits  CBC WITH DIFFERENTIAL/PLATELET  MAGNESIUM    EKG None  Radiology No results found.  Procedures Procedures    Medications Ordered in ED Medications - No data to display  ED Course/ Medical Decision Making/ A&P                             Medical Decision Making Problems Addressed: Hyperglycemia: undiagnosed new problem with uncertain prognosis Muscle cramps: undiagnosed new problem with uncertain prognosis  Amount and/or Complexity of Data  Reviewed External Data Reviewed: labs and notes. Labs: ordered. Decision-making details documented in ED Course.  Risk Decision regarding hospitalization.   Patient is a 52 year old who presents with some muscle cramping.  He does not currently have any muscle cramping but it has been intermittent for the last few days.  He has good perfusion in all extremities.  No swelling.  He is neurologically intact.  His labs show an elevated glucose but otherwise nonconcerning.  His sodium and potassium level were normal.  His magnesium  level was normal.  Urinalysis showed some protein but otherwise nonconcerning.  Patient is currently asymptomatic.  He was discharged home in good condition.  He was encouraged to have follow-up with his PCP.  Advised that he will need to have his glucose rechecked.  Return precautions were given.  Final Clinical Impression(s) / ED Diagnoses Final diagnoses:  Muscle cramps  Hyperglycemia    Rx / DC Orders ED Discharge Orders     None         Malvin Johns, MD 10/06/22 1700

## 2022-10-06 NOTE — ED Triage Notes (Signed)
Pt reports leg spasms and cramping for past several days. He takes potassium supplements. Has been drinking pickle juice for several days which did seem to help. No other complaints and no distress.

## 2022-10-06 NOTE — Discharge Instructions (Signed)
Follow-up with your primary care doctor for recheck.  You need to have your glucose rechecked.  Return to emergency room if you have any worsening symptoms.

## 2022-10-06 NOTE — ED Notes (Signed)
Patient verbalizes understanding of discharge instructions. Opportunity for questioning and answers were provided. Patient discharged from ED.  °

## 2022-10-27 ENCOUNTER — Other Ambulatory Visit: Payer: Self-pay | Admitting: Thoracic Surgery (Cardiothoracic Vascular Surgery)

## 2022-10-27 DIAGNOSIS — I7121 Aneurysm of the ascending aorta, without rupture: Secondary | ICD-10-CM

## 2022-11-11 ENCOUNTER — Other Ambulatory Visit: Payer: Self-pay | Admitting: Cardiology

## 2022-11-27 NOTE — Progress Notes (Signed)
Cardiology Office Note:    Date:  11/28/2022   ID:  Ricky [name], DOB Mar 30, 1971, MRN [id number]  PCP:  Maryagnes Amos, FNP  Cardiologist:  Little Ishikawa, MD  Electrophysiologist:  None   Referring MD: Maryagnes Amos   No chief complaint on file.   History of Present Illness:    Ricky [name] is a 52 y.o. male with a hx of frequent PVCs, thoracic aortic aneurysm, OSA hypertension, thrombocytopenia who presents for follow-up.  He was initially seen on 07/22/2019 after he was referred by Caryn Bee, FNP for an evaluation of PVCs.  Had an ED visit on 07/10/2019 with chest pain/body aches and lightheadedness.  EKG showed bigeminy. TTE was done on 07/28/2019 which showed low normal systolic function (EF 50 to 55%), septal hypokinesis.  Also was notable for ascending aortic aneurysm measuring up to 48 mm.  Underwent CTA chest on 08/16/2019, which showed ascending aortic aneurysm measuring up to 47mm.  Cardiac monitor showed frequent PVCs (23% of beats) and 120 episodes of SVT, lasting up to 1 minute.  Lexiscan Myoview on 08/18/2019 showed no evidence of ischemia.  Diagnosed with OSA.  He was referred to Dr. Ladona Ridgel in EP for evaluation of his PVCs, recommended uptitrating his beta-blocker as initial treatment and repeat monitor once on Coreg 25 mg twice daily.  Repeat monitor in 11/11/2019 showed significant improvement in PVC burden (5.3% of beats).  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  Cardiac MRI on 02/25/2021 showed no LGE, EF 53%, dilated aortic root measuring 47 mm, normal RV function.  Since last clinic visit, he reports he is doing well.  States that he had issues with muscle cramps in arms and legs, he reduce his statin dose to every other day and symptoms resolved.  Denies any chest pain, dyspnea, lightheadedness, syncope, lower extremity edema, or palpitations.  Reports BP 120/70s at  home.     BP Readings from Last 3 Encounters:  11/28/22 130/84  10/06/22 (!) 121/90  07/15/22 118/66    Wt Readings from Last 3 Encounters:  11/28/22 292 lb 12.8 oz (132.8 kg)  07/15/22 294 lb 12.8 oz (133.7 kg)  06/23/22 290 lb 9.6 oz (131.8 kg)      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   Eval Dr Trey Sailors, Neurosurgery 10/13   Chest pain    Depression    High cholesterol    Hypertension    Osteoarthritis    Osteoarthritis    Overweight    SOB (shortness of breath)    Thrombocytopenia 07/07/2012   138,000 08/26/11; 98,000 06/29/12    Past Surgical History:  Procedure Laterality Date   ADENOIDECTOMY     ANTERIOR FUSION CERVICAL SPINE     APPENDECTOMY     BOWEL RESECTION N/A 05/24/2021   Procedure: SMALL BOWEL RESECTION;  Surgeon: Griselda Miner, MD;  Location: [facility] OR;  Service: General;  Laterality: N/A;   HERNIA REPAIR     IRRIGATION AND DEBRIDEMENT SEBACEOUS CYST     on scalp   KNEE ARTHROSCOPY     x2   LAPAROSCOPY N/A 05/24/2021   Procedure: LAPAROSCOPY DIAGNOSTIC;  Surgeon: Griselda Miner, MD;  Location: [facility] OR;  Service: General;  Laterality: N/A;   LAPAROTOMY N/A 05/24/2021   Procedure: EXPLORATORY LAPAROTOMY;  Surgeon: Griselda Miner, MD;  Location: [facility]  OR;  Service: General;  Laterality: N/A;   LUMBAR FUSION     POSTERIOR FUSION CERVICAL SPINE     TONSILLECTOMY      Current Medications: Current Meds  Medication Sig   Azilsartan-Chlorthalidone 40-25 MG TABS Take 1 tablet by mouth daily.   Blood Pressure Monitoring (BLOOD PRESSURE CUFF) MISC XL Adult blood pressure cuff   carvedilol (COREG) 25 MG tablet TAKE 1 TABLET(25 MG) BY MOUTH TWICE DAILY WITH A MEAL   cyclobenzaprine (FLEXERIL) 10 MG tablet Take 10 mg by mouth 3 (three) times daily.   hydrOXYzine (VISTARIL) 50 MG capsule Take 50 mg by mouth daily as needed.   ibuprofen (ADVIL) 200 MG tablet Take 400 mg by mouth every 6 (six) hours as  needed for fever, headache or mild pain.   NOREL AD 4-10-325 MG TABS Take 1 tablet by mouth as needed.   ondansetron (ZOFRAN-ODT) 4 MG disintegrating tablet Take 1 tablet (4 mg total) by mouth every 8 (eight) hours as needed for nausea or vomiting.   potassium chloride SA (KLOR-CON M20) 20 MEQ tablet TAKE 1 TABLET DAILY   rosuvastatin (CRESTOR) 10 MG tablet Take 10 mg by mouth every other day.   sildenafil (VIAGRA) 100 MG tablet TAKE 1/2 TO 1 TABLET BY MOUTH AS NEEDED. Defer further refills to PCP.   [DISCONTINUED] rosuvastatin (CRESTOR) 10 MG tablet Take 1 tablet (10 mg total) by mouth daily. (Patient taking differently: Take 10 mg by mouth every other day.)     Allergies:   Patient has no known allergies.   Social History   Socioeconomic History   Marital status: Single    Spouse name: Not on file   Number of children: Not on file   Years of education: Not on file   Highest education level: Not on file  Occupational History   Occupation: Disabled  Tobacco Use   Smoking status: Former    Packs/day: 0.50    Years: 6.00    Additional pack years: 0.00    Total pack years: 3.00    Types: Cigarettes    Quit date: 2013    Years since quitting: 11.2   Smokeless tobacco: Never  Vaping Use   Vaping Use: Never used  Substance and Sexual Activity   Alcohol use: Yes    Comment: A glass of wine   Drug use: No   Sexual activity: Not on file  Other Topics Concern   Not on file  Social History Narrative   Not on file   Social Determinants of Health   Financial Resource Strain: Not on file  Food Insecurity: Not on file  Transportation Needs: Not on file  Physical Activity: Not on file  Stress: Not on file  Social Connections: Not on file     Family History: No family history of heart disease  ROS:   Please see the history of present illness.    All other systems reviewed and are negative.  EKGs/Labs/Other Studies Reviewed:    The following studies were reviewed  today:  EKG:   11/28/2022: Normal sinus rhythm, PVC, rate 62  Well since 04/04/21: NSR, rate 50, no PVCs 01/02/2021- The ekg ordered today demonstrates normal sinus rhythm, rate 62, no PVC's  10/01/2020- The ekg ordered demonstrates sinus rhythm, rate 93, PVCs  Recent Labs: 10/06/2022: ALT 20; BUN 13; Creatinine, Ser 0.84; Hemoglobin 14.5; Magnesium 1.9; Platelets 183; Potassium 4.0; Sodium 136  Recent Lipid Panel    Component Value Date/Time   CHOL 113 10/21/2021  1341   TRIG 126 10/21/2021 1341   HDL 30 (L) 10/21/2021 1341   CHOLHDL 3.8 10/21/2021 1341   LDLCALC 60 10/21/2021 1341    Physical Exam:    VS:  BP 130/84   Pulse 62   Ht 6' (1.829 m)   Wt 292 lb 12.8 oz (132.8 kg)   SpO2 98%   BMI 39.71 kg/m     Wt Readings from Last 3 Encounters:  11/28/22 292 lb 12.8 oz (132.8 kg)  07/15/22 294 lb 12.8 oz (133.7 kg)  06/23/22 290 lb 9.6 oz (131.8 kg)     GEN:  Well nourished, well developed in no acute distress HEENT: Normal NECK: No JVD CARDIAC: RRR, no murmurs, rubs, gallops RESPIRATORY:  Clear to auscultation without rales, wheezing or rhonchi  ABDOMEN: Soft, non-tender, non-distended MUSCULOSKELETAL:  No edema; No deformity  SKIN: Warm and dry NEUROLOGIC:  Alert and oriented x 3 PSYCHIATRIC:  Normal affect    Cardiac monitor 08/21/19: Frequent ventricular ectopy (23% of beats) 120 episodes of SVT, longest lasting 59 seconds at 152 bpm One 4 beat episode of NSVT Patient triggered events corresponded to sinus rhythm with ventricular ectopy, incluging bigeminy   4 days of data recorded on Zio monitor. Patient had a min HR of 46 bpm, max HR of 207 bpm, and avg HR of 77 bpm. Predominant underlying rhythm was Sinus Rhythm. No atrial fibrillation, high degree block, or pauses noted. There was one 4 beat run of NSVT and 120 runs of SVT, longest lasting 59 seconds at 152 bpm.  Isolated atrial was ectopy was rare (<1%).  Very frequent ventricular ectopy (23% of beats).  Episodes  of bigeminy (longest lasting 132 seconds) and trigeminy (longest lasting 98 seconds).  There were 2 triggered events, corresponding to sinus rhythm with ventricular bigeminy.   No significant arrhythmias detected.  CTA chest 08/16/19: Aneurysmal dilatation of the ascending thoracic aorta up to 47 mm. Recommend semi-annual imaging followup by CTA or MRA and referral to cardiothoracic surgery if not already obtained. This recommendation follows 2010 ACCF/AHA/AATS/ACR/ASA/SCA/SCAI/SIR/STS/SVM Guidelines for the Diagnosis and Management of Patients With Thoracic Aortic Disease. Circulation. 2010; 121: Q657-Q469. Aortic aneurysm NOS (ICD10-I71.9)   Lexiscan Myoview 08/18/2019: The study is normal. This is a low risk study.   Normal pharmacologic nuclear stress test with no evidence for prior infarct or ischemia. LVEF not calculated. Frequent PVCs.  TTE 07/28/19:  1. Left ventricular ejection fraction, by visual estimation, is 50 to 55%. The left ventricle has low normal function. There is mildly increased left ventricular hypertrophy.  2. Mildly dilated left ventricular internal cavity size.  3. The left ventricle demonstrates regional wall motion abnormalities. Septal hypokinesis  4. Global right ventricle has normal systolic function.The right ventricular size is normal. No increase in right ventricular wall thickness.  5. Left atrial size was normal.  6. Right atrial size was normal.  7. The mitral valve is normal in structure. No evidence of mitral valve regurgitation.  8. The tricuspid valve is normal in structure. Tricuspid valve regurgitation is not demonstrated.  9. The aortic valve is tricuspid. Aortic valve regurgitation is not visualized. No evidence of aortic valve sclerosis or stenosis. 10. The inferior vena cava is normal in size with greater than 50% respiratory variability, suggesting right atrial pressure of 3 mmHg. 11. Aneurysm of the ascending aorta, measuring 48  mm.  ASSESSMENT:    1. CAD in native artery   2. Primary hypertension   3. Frequent PVCs  4. Hyperlipidemia, unspecified hyperlipidemia type   5. Type 2 diabetes mellitus without complication, without long-term current use of insulin      PLAN:    Hypertension: Previously was on amlodipine 5 mg daily, carvedilol 25 mg twice daily, spironolactone 25 mg daily, and azilsartan-chlorthalidone 40-25 mg.  Given aortic aneurysm, recommend goal SBP less than 120.  Has improved significantly since starting CPAP and with weight loss, have been able to cut back on regimen.   -Currently on azilsartan-chlorthalidone 40-25 mg daily, carvedilol 25 mg BID.  Appears controlled.  Check BMET, magnesium  PVCs:  frequent PVCs (23% of beats) on monitor 08/2019.  Symptomatic episodes on monitor appear to correspond to episodes of bigeminy.   TTE shows low normal systolic function (EF ~50%), septal hypokinesis.  No evidence of ischemia on Myoview.  Seen by EP, Dr Ladona Ridgelaylor recommended titrating up coreg, with repeat monitor in 11/11/2019 showed significant improvement in PVC burden (5.3% of beats).  No evidence of sarcoidosis on cardiac MRI. -Significantly improved with carvedilol, will continue carvedilol 25 mg twice daily  Aortic aneurysm: 4.7 cm ascending aortic aneurysm on CTA chest 12/18.  Repeat CTA chest 12/09/19 showed stable aneurysm, measured 4.5 cm.  Follows with cardiothoracic surgery, 2148-month follow-up CT on 06/11/2020 showed stable aneurysm at 4.7 cm.  CTA 11/06/21 showed aneurysm measured 5.0 cm.  CTA chest 04/2022 showed stable ascending aortic aneurysm measuring 4.7 cm.  CAD: Reports atypical chest pain.  Given frequent PVCs and hypokinesis seen on TTE, Lexiscan Myoview was done on 08/18/19, which showed no evidence of ischemia.  Coronary CTA on 12/08/2019 showed minimal nonobstructive CAD in mid LAD, calcium score 19 (84th percentile). -Continue rosuvastatin, he reports he reduced his dose to 10 mg every  other day due to myalgias.  Check lipid panel  Hyperlipidemia: LDL 113 on 01/31/2020.  Started rosuvastatin 10 mg daily, LDL 60 on 10/21/2021. He reports he reduced his dose to 10 mg every other day due to myalgias.  Check lipid panel  OSA: Encouraged compliance with CPAP  T2DM: off medication, will check A1c   RTC in 6 months    Medication Adjustments/Labs and Tests Ordered: Current medicines are reviewed at length with the patient today.  Concerns regarding medicines are outlined above.  Orders Placed This Encounter  Procedures   Basic metabolic panel   Magnesium   Lipid panel   Hemoglobin A1c   EKG 12-Lead    No orders of the defined types were placed in this encounter.    Patient Instructions  Medication Instructions:  Your physician recommends that you continue on your current medications as directed. Please refer to the Current Medication list given to you today.  *If you need a refill on your cardiac medications before your next appointment, please call your pharmacy*  Lab Work: BMET, Mag, Lipid, A1C today  If you have labs (blood work) drawn today and your tests are completely normal, you will receive your results only by: MyChart Message (if you have MyChart) OR A paper copy in the mail If you have any lab test that is abnormal or we need to change your treatment, we will call you to review the results.  Follow-Up: At [facility], you and your health needs are our priority.  As part of our continuing mission to provide you with exceptional heart care, we have created designated Provider Care Teams.  These Care Teams include your primary Cardiologist (physician) and Advanced Practice Providers (APPs -  Physician Assistants and Nurse  Practitioners) who all work together to provide you with the care you need, when you need it.  We recommend signing up for the patient portal called "MyChart".  Sign up information is provided on this After Visit Summary.   MyChart is used to connect with patients for Virtual Visits (Telemedicine).  Patients are able to view lab/test results, encounter notes, upcoming appointments, etc.  Non-urgent messages can be sent to your provider as well.   To learn more about what you can do with MyChart, go to [email or web address].    Your next appointment:   6 month(s)  Provider:   Little Ishikawa, MD        Signed, Little Ishikawa, MD  11/28/2022 3:06 PM    [facility]

## 2022-11-28 ENCOUNTER — Ambulatory Visit: Payer: Medicare Other | Attending: Cardiology | Admitting: Cardiology

## 2022-11-28 ENCOUNTER — Encounter: Payer: Self-pay | Admitting: Cardiology

## 2022-11-28 VITALS — BP 130/84 | HR 62 | Ht 72.0 in | Wt 292.8 lb

## 2022-11-28 DIAGNOSIS — I493 Ventricular premature depolarization: Secondary | ICD-10-CM

## 2022-11-28 DIAGNOSIS — I7121 Aneurysm of the ascending aorta, without rupture: Secondary | ICD-10-CM

## 2022-11-28 DIAGNOSIS — I2510 Atherosclerotic heart disease of native coronary artery without angina pectoris: Secondary | ICD-10-CM | POA: Diagnosis not present

## 2022-11-28 DIAGNOSIS — E785 Hyperlipidemia, unspecified: Secondary | ICD-10-CM | POA: Diagnosis not present

## 2022-11-28 DIAGNOSIS — I10 Essential (primary) hypertension: Secondary | ICD-10-CM | POA: Diagnosis not present

## 2022-11-28 DIAGNOSIS — E119 Type 2 diabetes mellitus without complications: Secondary | ICD-10-CM

## 2022-11-28 NOTE — Patient Instructions (Signed)
Medication Instructions:  Your physician recommends that you continue on your current medications as directed. Please refer to the Current Medication list given to you today.  *If you need a refill on your cardiac medications before your next appointment, please call your pharmacy*  Lab Work: BMET, Mag, Lipid, A1C today  If you have labs (blood work) drawn today and your tests are completely normal, you will receive your results only by: MyChart Message (if you have MyChart) OR A paper copy in the mail If you have any lab test that is abnormal or we need to change your treatment, we will call you to review the results.  Follow-Up: At [facility], you and your health needs are our priority.  As part of our continuing mission to provide you with exceptional heart care, we have created designated Provider Care Teams.  These Care Teams include your primary Cardiologist (physician) and Advanced Practice Providers (APPs -  Physician Assistants and Nurse Practitioners) who all work together to provide you with the care you need, when you need it.  We recommend signing up for the patient portal called "MyChart".  Sign up information is provided on this After Visit Summary.  MyChart is used to connect with patients for Virtual Visits (Telemedicine).  Patients are able to view lab/test results, encounter notes, upcoming appointments, etc.  Non-urgent messages can be sent to your provider as well.   To learn more about what you can do with MyChart, go to [email or web address].    Your next appointment:   6 month(s)  Provider:   Little Ishikawa, MD

## 2022-11-29 LAB — BASIC METABOLIC PANEL
BUN/Creatinine Ratio: 14 (ref 9–20)
BUN: 11 mg/dL (ref 6–24)
CO2: 24 mmol/L (ref 20–29)
Calcium: 9.3 mg/dL (ref 8.7–10.2)
Chloride: 101 mmol/L (ref 96–106)
Creatinine, Ser: 0.81 mg/dL (ref 0.76–1.27)
Glucose: 96 mg/dL (ref 70–99)
Potassium: 4.3 mmol/L (ref 3.5–5.2)
Sodium: 141 mmol/L (ref 134–144)
eGFR: 105 mL/min/{1.73_m2} (ref >59)

## 2022-11-29 LAB — LIPID PANEL
Chol/HDL Ratio: 6.4 ratio — ABNORMAL HIGH (ref 0.0–5.0)
Cholesterol, Total: 210 mg/dL — ABNORMAL HIGH (ref 100–199)
HDL: 33 mg/dL — ABNORMAL LOW (ref >39)
LDL Chol Calc (NIH): 141 mg/dL — ABNORMAL HIGH (ref 0–99)
Triglycerides: 197 mg/dL — ABNORMAL HIGH (ref 0–149)
VLDL Cholesterol Cal: 36 mg/dL (ref 5–40)

## 2022-11-29 LAB — HEMOGLOBIN A1C
Est. average glucose Bld gHb Est-mCnc: 134 mg/dL
Hgb A1c MFr Bld: 6.3 % — ABNORMAL HIGH (ref 4.8–5.6)

## 2022-11-29 LAB — MAGNESIUM: Magnesium: 2.2 mg/dL (ref 1.6–2.3)

## 2022-12-01 ENCOUNTER — Other Ambulatory Visit: Payer: Self-pay | Admitting: *Deleted

## 2022-12-01 DIAGNOSIS — E785 Hyperlipidemia, unspecified: Secondary | ICD-10-CM

## 2022-12-01 MED ORDER — ATORVASTATIN CALCIUM 40 MG PO TABS: 40.0000 mg | ORAL_TABLET | Freq: Every day | ORAL | 3 refills | Status: AC

## 2022-12-12 ENCOUNTER — Ambulatory Visit (INDEPENDENT_AMBULATORY_CARE_PROVIDER_SITE_OTHER): Payer: Medicare Other | Admitting: Thoracic Surgery (Cardiothoracic Vascular Surgery)

## 2022-12-12 ENCOUNTER — Ambulatory Visit
Admission: RE | Admit: 2022-12-12 | Discharge: 2022-12-12 | Disposition: A | Discharge status: Home / Self Care | Payer: Medicare Other | Source: Ambulatory Visit | Attending: Thoracic Surgery (Cardiothoracic Vascular Surgery) | Admitting: Thoracic Surgery (Cardiothoracic Vascular Surgery)

## 2022-12-12 DIAGNOSIS — I7121 Aneurysm of the ascending aorta, without rupture: Secondary | ICD-10-CM

## 2022-12-12 MED ORDER — IOPAMIDOL (ISOVUE-370) INJECTION 76%
75.0000 mL | Freq: Once | INTRAVENOUS | Status: AC | PRN
  Administered 2022-12-12: 75 mL via INTRAVENOUS

## 2022-12-12 NOTE — Progress Notes (Signed)
     [address]       [place] [name] [address]             [phone]       Patient: Home Provider: Office Consent for Telemedicine visit obtained.  Today's visit was completed via a real-time telehealth (see specific modality noted below). The patient/authorized person provided oral consent at the time of the visit to engage in a telemedicine encounter with the present provider at [facility]. The patient/authorized person was informed of the potential benefits, limitations, and risks of telemedicine. The patient/authorized person expressed understanding that the laws that protect confidentiality also apply to telemedicine. The patient/authorized person acknowledged understanding that telemedicine does not provide emergency services and that he or she would need to call 911 or proceed to the nearest hospital for help if such a need arose.   Total time spent in the clinical discussion 10 minutes.  Telehealth Modality: Phone visit (audio only)  Recent Radiology Findings:   CT ANGIO CHEST AORTA W/CM & OR WO/CM  Result Date: 12/12/2022 CLINICAL DATA:  Thoracic aortic aneurysm surveillance EXAM: CT ANGIOGRAPHY CHEST WITH CONTRAST TECHNIQUE: Multidetector CT imaging of the chest was performed using the standard protocol during bolus administration of intravenous contrast. Multiplanar CT image reconstructions and MIPs were obtained to evaluate the vascular anatomy. RADIATION DOSE REDUCTION: This exam was performed according to the departmental dose-optimization program which includes automated exposure control, adjustment of the mA and/or kV according to patient size and/or use of iterative reconstruction technique. CONTRAST:  75mL ISOVUE-370 IOPAMIDOL (ISOVUE-370) INJECTION 76% COMPARISON:  05/13/2022 FINDINGS: Cardiovascular: Minimal atheromatous vascular calcification of the aortic arch. Ascending thoracic aortic aneurysm 4.8 cm in diameter on image 68 series 5, formerly the same when  measured at the same level. Aortic root caliber 4.0 cm. Proximal arch caliber 3.4 cm. Distal arch caliber 3.0 cm. Mid descending thoracic aorta 2.7 cm. Just above the hiatus the aorta measures 2.4 cm in diameter. No findings of aortic dissection or acute aortic abnormality. Timing was not optimized for pulmonary arterial opacification, but no large or central embolus is identified in the pulmonary arterial tree. Mild cardiomegaly is present. Mediastinum/Nodes: Unremarkable Lungs/Pleura: Unremarkable Upper Abdomen: Unremarkable Musculoskeletal: Old healed rib fractures.  Thoracic spondylosis. Review of the MIP images confirms the above findings. IMPRESSION: 1. 4.8 cm in diameter ascending thoracic aortic aneurysm, demonstrating stability since 08/05/2019. Semi annual imaging follow up is recommended; if there is good correlation of echocardiography with CTA measurements in this patient than echocardiography may be reasonable for regular monitoring and avoids radiation exposure, otherwise CT angiography would be suggested. Aortic aneurysm NOS (ICD10-I71.9) 2. Mild cardiomegaly. 3. Thoracic spondylosis. 4. Old healed rib fractures. Electronically Signed   By: Ricky [name] M.D.   On: 12/12/2022 13:40    I had a telephone visit with Ricky [name]  Assessment:  52yo male  with a 4.8 cm ascending aortic aneurysm.  Echocardiogram shows a trileaflet valve without evidence of regurgitation.  We discussed the natural history and and risk factors for growth of ascending aortic aneurysms.  We covered the importance of smoking cessation, tight blood pressure control, refraining from lifting heavy objects, and avoiding fluoroquinolones.  The patient is aware of signs and symptoms of aortic dissection and when to present to the emergency department.  We will continue surveillance and a repeat CT was ordered for 12 months.  Ricky [name]

## 2022-12-22 ENCOUNTER — Other Ambulatory Visit: Payer: Self-pay | Admitting: Cardiology

## 2023-01-08 ENCOUNTER — Encounter (HOSPITAL_BASED_OUTPATIENT_CLINIC_OR_DEPARTMENT_OTHER): Payer: Self-pay

## 2023-01-08 ENCOUNTER — Emergency Department (HOSPITAL_BASED_OUTPATIENT_CLINIC_OR_DEPARTMENT_OTHER): Payer: Medicare Other

## 2023-01-08 ENCOUNTER — Other Ambulatory Visit: Payer: Self-pay

## 2023-01-08 ENCOUNTER — Emergency Department (HOSPITAL_BASED_OUTPATIENT_CLINIC_OR_DEPARTMENT_OTHER)
Admission: EM | Admit: 2023-01-08 | Discharge: 2023-01-08 | Disposition: A | Discharge status: Home / Self Care | Payer: Medicare Other | Attending: Emergency Medicine | Admitting: Emergency Medicine

## 2023-01-08 DIAGNOSIS — Z79899 Other long term (current) drug therapy: Secondary | ICD-10-CM | POA: Insufficient documentation

## 2023-01-08 DIAGNOSIS — M79662 Pain in left lower leg: Secondary | ICD-10-CM | POA: Diagnosis present

## 2023-01-08 DIAGNOSIS — I10 Essential (primary) hypertension: Secondary | ICD-10-CM | POA: Insufficient documentation

## 2023-01-08 NOTE — Discharge Instructions (Signed)
As we discussed, your workup in the ER today was reassuring for acute findings.  Ultrasound imaging of your leg did not reveal any emergent concerns.  I suspect that your symptoms are due to a muscle strain or soreness from mowing the yard yesterday.  I recommend that you rest, ice, compress, and elevate your leg and take Tylenol/ibuprofen as needed for pain.  Follow-up with your primary care doctor as needed.  Return if development of any new or worsening symptoms.

## 2023-01-08 NOTE — ED Provider Notes (Signed)
[place] EMERGENCY DEPARTMENT AT [facility] Provider Note   CSN: [id number] Arrival date & time: 01/08/23  1139     History  Chief Complaint  Patient presents with   Leg Pain    Ricky [name] is a 52 y.o. male.  Patient with history of hypertension presents today with complaints of left calf pain.  He states that he was mowing the yard yesterday and in the evening afterwards began to feel pain throughout his left calf.  He did a Web MD search and was concerned for blood clot.  He denies any history of blood clots.  No recent travel or recent surgeries.  He is not anticoagulated.  Denies any injury to his leg.  Pain is isolated to his left calf.  Denies any numbness/tingling in his foot or leg.  No fevers or chills.  No shortness of breath.  The history is provided by the patient. No language interpreter was used.  Leg Pain      Home Medications Prior to Admission medications   Medication Sig Start Date End Date Taking? Authorizing Provider  atorvastatin (LIPITOR) 40 MG tablet Take 1 tablet (40 mg total) by mouth daily. 12/01/22 11/26/23  Little Ishikawa, MD  Blood Pressure Monitoring (BLOOD PRESSURE CUFF) MISC XL Adult blood pressure cuff 08/18/19   Little Ishikawa, MD  carvedilol (COREG) 25 MG tablet TAKE 1 TABLET(25 MG) BY MOUTH TWICE DAILY WITH A MEAL 11/04/21   Little Ishikawa, MD  cyclobenzaprine (FLEXERIL) 10 MG tablet Take 10 mg by mouth 3 (three) times daily. 05/29/21   [provider]  EDARBYCLOR 40-25 MG TABS TAKE 1 TABLET DAILY (DOSE INCREASE) 12/23/22   Little Ishikawa, MD  hydrOXYzine (VISTARIL) 50 MG capsule Take 50 mg by mouth daily as needed.    [provider]  ibuprofen (ADVIL) 200 MG tablet Take 400 mg by mouth every 6 (six) hours as needed for fever, headache or mild pain.    [provider]  NOREL AD 4-10-325 MG TABS Take 1 tablet by mouth as needed. 09/16/21   [provider]   ondansetron (ZOFRAN-ODT) 4 MG disintegrating tablet Take 1 tablet (4 mg total) by mouth every 8 (eight) hours as needed for nausea or vomiting. 11/06/21   Ernie Avena, MD  potassium chloride SA (KLOR-CON M20) 20 MEQ tablet TAKE 1 TABLET DAILY 11/11/22   Little Ishikawa, MD  sildenafil (VIAGRA) 100 MG tablet TAKE 1/2 TO 1 TABLET BY MOUTH AS NEEDED. Defer further refills to PCP. 04/04/21   Little Ishikawa, MD      Allergies    Patient has no known allergies.    Review of Systems   Review of Systems  Musculoskeletal:  Positive for myalgias.  All other systems reviewed and are negative.   Physical Exam Updated Vital Signs BP 102/68   Pulse (!) 46   Temp 98.4 F (36.9 C) (Oral)   Resp 15   Ht 6' (1.829 m)   Wt 124.7 kg   SpO2 100%   BMI 37.30 kg/m  Physical Exam Vitals and nursing note reviewed.  Constitutional:      General: He is not in acute distress.    Appearance: Normal appearance. He is normal weight. He is not ill-appearing, toxic-appearing or diaphoretic.  HENT:     Head: Normocephalic and atraumatic.  Cardiovascular:     Rate and Rhythm: Normal rate.  Pulmonary:     Effort: Pulmonary effort is normal. No respiratory distress.  Musculoskeletal:        General: Normal range of motion.     Cervical back: Normal range of motion.     Comments: TTP over the left posterior calf.  No erythema, warmth, or palpable cord.  Negative Homans' sign.  DP and PT pulses intact and 2+.  ROM intact with minimal discomfort.  No focal bony tenderness.  Patient observed to be ambulatory with steady gait.  Compartments soft, no obvious deformity.  Skin:    General: Skin is warm and dry.  Neurological:     General: No focal deficit present.     Mental Status: He is alert.  Psychiatric:        Mood and Affect: Mood normal.        Behavior: Behavior normal.     ED Results / Procedures / Treatments   Labs (all labs ordered are listed, but only abnormal results are  displayed) Labs Reviewed - No data to display  EKG None  Radiology US Venous Img Lower Unilateral Left  Result Date: 01/08/2023 CLINICAL DATA:  Left leg pain EXAM: LEFT LOWER EXTREMITY VENOUS DOPPLER ULTRASOUND TECHNIQUE: Gray-scale sonography with compression, as well as color and duplex ultrasound, were performed to evaluate the deep venous system(s) from the level of the common femoral vein through the popliteal and proximal calf veins. COMPARISON:  None Available. FINDINGS: VENOUS Normal compressibility of the common femoral, superficial femoral, and popliteal veins, as well as the visualized calf veins. Visualized portions of profunda femoral vein and great saphenous vein unremarkable. No filling defects to suggest DVT on grayscale or color Doppler imaging. Doppler waveforms show normal direction of venous flow, normal respiratory plasticity and response to augmentation. Limited views of the contralateral common femoral vein are unremarkable. OTHER None. Limitations: none IMPRESSION: Negative. Electronically Signed   By: Malachy Moan M.D.   On: 01/08/2023 12:53    Procedures Procedures    Medications Ordered in ED Medications - No data to display  ED Course/ Medical Decision Making/ A&P                             Medical Decision Making  Patient presents today with left calf pain x 1 day.  He is afebrile, nontoxic-appearing, and in no acute distress with reassuring vital signs.  Physical exam reveals TTP of the left posterior calf.  No erythema, warmth, fluctuance, induration, or palpable cord.  ROM intact with minimal discomfort.  No focal bony tenderness.  Ultrasound imaging obtained which was negative for acute findings.  I have personally reviewed and interpreted this imaging and agree with radiology interpretation.  Upon discussion with the patient, he states his primary concern was for DVT.  No signs of focal bony injury to warrant x-ray imaging.  No signs of septic arthritis  or arterial injury.  Suspect patient's symptoms likely due to a muscle strain or soreness from mowing the yard.  Discussed with patient who is understanding and in agreement.  Recommend RICE and denies/ibuprofen as needed for pain. Evaluation and diagnostic testing in the emergency department does not suggest an emergent condition requiring admission or immediate intervention beyond what has been performed at this time.  Plan for discharge with close PCP follow-up.  Patient is understanding and amenable with plan, educated on red flag symptoms that would prompt immediate return.  Patient discharged in stable condition.   Final Clinical Impression(s) / ED Diagnoses Final diagnoses:  Pain of left calf  Rx / DC Orders ED Discharge Orders     None     An After Visit Summary was printed and given to the patient.     Vear Clock 01/08/23 1442    Tegeler, Canary Brim, MD 01/08/23 [phone]

## 2023-01-08 NOTE — ED Triage Notes (Signed)
Patient here POV from Home.  Endorses Left Posterior Leg Pain since Yesterday at 1500. No Known Trauma or Injury but did mow the grass yesterday.   NAD Noted during Triage. A&Ox4. Gcs 15. Ambulatory.

## 2023-02-10 ENCOUNTER — Other Ambulatory Visit (HOSPITAL_BASED_OUTPATIENT_CLINIC_OR_DEPARTMENT_OTHER): Payer: Self-pay

## 2023-02-10 ENCOUNTER — Emergency Department (HOSPITAL_BASED_OUTPATIENT_CLINIC_OR_DEPARTMENT_OTHER): Payer: Medicare Other | Admitting: Radiology

## 2023-02-10 ENCOUNTER — Other Ambulatory Visit: Payer: Self-pay

## 2023-02-10 ENCOUNTER — Encounter (HOSPITAL_BASED_OUTPATIENT_CLINIC_OR_DEPARTMENT_OTHER): Payer: Self-pay

## 2023-02-10 ENCOUNTER — Emergency Department (HOSPITAL_BASED_OUTPATIENT_CLINIC_OR_DEPARTMENT_OTHER)
Admission: EM | Admit: 2023-02-10 | Discharge: 2023-02-10 | Disposition: A | Discharge status: Home / Self Care | Payer: Medicare Other | Attending: Emergency Medicine | Admitting: Emergency Medicine

## 2023-02-10 DIAGNOSIS — S8391XA Sprain of unspecified site of right knee, initial encounter: Secondary | ICD-10-CM | POA: Diagnosis not present

## 2023-02-10 DIAGNOSIS — W010XXA Fall on same level from slipping, tripping and stumbling without subsequent striking against object, initial encounter: Secondary | ICD-10-CM | POA: Insufficient documentation

## 2023-02-10 DIAGNOSIS — S92424A Nondisplaced fracture of distal phalanx of right great toe, initial encounter for closed fracture: Secondary | ICD-10-CM | POA: Diagnosis not present

## 2023-02-10 DIAGNOSIS — M25561 Pain in right knee: Secondary | ICD-10-CM | POA: Diagnosis present

## 2023-02-10 MED ORDER — HYDROCODONE-ACETAMINOPHEN 5-325 MG PO TABS
1.0000 | ORAL_TABLET | Freq: Once | ORAL | Status: AC
  Administered 2023-02-10: 1 via ORAL
  Filled 2023-02-10: qty 1

## 2023-02-10 MED ORDER — HYDROCODONE-ACETAMINOPHEN 5-325 MG PO TABS: 1.0000 | ORAL_TABLET | Freq: Three times a day (TID) | ORAL | 0 refills | Status: AC | PRN

## 2023-02-10 MED ORDER — ACETAMINOPHEN 500 MG PO TABS
500.0000 mg | ORAL_TABLET | Freq: Four times a day (QID) | ORAL | 0 refills | Status: DC | PRN
  Filled 2023-02-10: qty 30, 8d supply, fill #0

## 2023-02-10 MED ORDER — HYDROCODONE-ACETAMINOPHEN 5-325 MG PO TABS
1.0000 | ORAL_TABLET | Freq: Three times a day (TID) | ORAL | 0 refills | Status: DC | PRN
  Filled 2023-02-10: qty 9, 3d supply, fill #0

## 2023-02-10 MED ORDER — ACETAMINOPHEN 325 MG PO TABS: 650.0000 mg | ORAL_TABLET | Freq: Once | ORAL | Status: DC

## 2023-02-10 MED ORDER — HYDROCODONE-ACETAMINOPHEN 5-325 MG PO TABS: 1.0000 | ORAL_TABLET | Freq: Three times a day (TID) | ORAL | Status: DC | PRN

## 2023-02-10 MED ORDER — ACETAMINOPHEN 500 MG PO TABS: 500.0000 mg | ORAL_TABLET | Freq: Four times a day (QID) | ORAL | 0 refills | Status: AC | PRN

## 2023-02-10 NOTE — ED Provider Notes (Signed)
[place] EMERGENCY DEPARTMENT AT [facility] Provider Note   CSN: [id number] Arrival date & time: 02/10/23  1327     History  Chief Complaint  Patient presents with   Ricky [name] is a 52 y.o. male.  HPI    52 year old male comes in with chief complaint of mechanical fall.  Patient has history of thrombocytopenia, SBO.  Patient has a mechanical fall at 4 AM today.  Patient states that he was walking, slipped over a rug.  His left leg went forward, right knee went backwards.  He is having pain over the right knee and the right great toe.  Patient has significant discomfort when ambulating.  Patient heard a pop as well.  Home Medications Prior to Admission medications   Medication Sig Start Date End Date Taking? Authorizing Provider  acetaminophen (TYLENOL) 500 MG tablet Take 1 tablet (500 mg total) by mouth every 6 (six) hours as needed. 02/10/23  Yes Derwood Kaplan, MD  HYDROcodone-acetaminophen (NORCO/VICODIN) 5-325 MG tablet Take 1 tablet by mouth every 8 (eight) hours as needed for up to 3 days for severe pain. 02/10/23 02/13/23 Yes Derwood Kaplan, MD  atorvastatin (LIPITOR) 40 MG tablet Take 1 tablet (40 mg total) by mouth daily. 12/01/22 11/26/23  Little Ishikawa, MD  Blood Pressure Monitoring (BLOOD PRESSURE CUFF) MISC XL Adult blood pressure cuff 08/18/19   Little Ishikawa, MD  carvedilol (COREG) 25 MG tablet TAKE 1 TABLET(25 MG) BY MOUTH TWICE DAILY WITH A MEAL 11/04/21   Little Ishikawa, MD  cyclobenzaprine (FLEXERIL) 10 MG tablet Take 10 mg by mouth 3 (three) times daily. 05/29/21   [provider]  EDARBYCLOR 40-25 MG TABS TAKE 1 TABLET DAILY (DOSE INCREASE) 12/23/22   Little Ishikawa, MD  hydrOXYzine (VISTARIL) 50 MG capsule Take 50 mg by mouth daily as needed.    [provider]  ibuprofen (ADVIL) 200 MG tablet Take 400 mg by mouth every 6 (six) hours as needed for fever, headache or mild pain.     [provider]  ondansetron (ZOFRAN-ODT) 4 MG disintegrating tablet Take 1 tablet (4 mg total) by mouth every 8 (eight) hours as needed for nausea or vomiting. 11/06/21   Ernie Avena, MD  potassium chloride SA (KLOR-CON M20) 20 MEQ tablet TAKE 1 TABLET DAILY 11/11/22   Little Ishikawa, MD  sildenafil (VIAGRA) 100 MG tablet TAKE 1/2 TO 1 TABLET BY MOUTH AS NEEDED. Defer further refills to PCP. 04/04/21   Little Ishikawa, MD      Allergies    Patient has no known allergies.    Review of Systems   Review of Systems  All other systems reviewed and are negative.   Physical Exam Updated Vital Signs BP (!) 140/80   Pulse 65   Temp 98.6 F (37 C) (Oral)   Resp 16   Ht 6' (1.829 m)   Wt 126.6 kg   SpO2 99%   BMI 37.84 kg/m  Physical Exam Vitals and nursing note reviewed.  Constitutional:      Appearance: He is well-developed.  HENT:     Head: Atraumatic.  Cardiovascular:     Rate and Rhythm: Normal rate.  Pulmonary:     Effort: Pulmonary effort is normal.  Musculoskeletal:        General: Tenderness present. No swelling or deformity.     Cervical back: Neck supple.     Comments: Ecchymosis, edema and tenderness of the right great  toe noted.  Patient has tenderness with palpation around the patella on the right side, but there is no large effusion and no laxity.  Skin:    General: Skin is warm.  Neurological:     Mental Status: He is alert and oriented to person, place, and time.     ED Results / Procedures / Treatments   Labs (all labs ordered are listed, but only abnormal results are displayed) Labs Reviewed - No data to display  EKG None  Radiology DG Foot Complete Right  Result Date: 02/10/2023 CLINICAL DATA:  Pain after fall EXAM: RIGHT FOOT COMPLETE - 3 VIEW COMPARISON:  None Available. FINDINGS: Osteopenia. There is a comminuted nondisplaced fracture of the midshaft of the proximal phalanx of the great toe. No additional fracture or  dislocation. Advanced degenerative changes of the first metatarsophalangeal joint with joint space loss, sclerosis and osteophytes. No additional fracture or dislocation. Small well corticated plantar calcaneal spur. IMPRESSION: Comminuted fracture of the midshaft of the proximal phalanx of the great toe. Advanced degenerative changes of the first metatarsal phalangeal joint. Osteopenia Electronically Signed   By: Karen Kays M.D.   On: 02/10/2023 14:23   DG Knee Complete 4 Views Right  Result Date: 02/10/2023 CLINICAL DATA:  Pain after fall EXAM: RIGHT KNEE - COMPLETE 4 VIEW COMPARISON:  None Available. FINDINGS: Osteopenia. No fracture or dislocation. Preserved joint spaces and bone mineralization. No joint effusion on lateral view. IMPRESSION: No acute osseous abnormality. Electronically Signed   By: Karen Kays M.D.   On: 02/10/2023 14:21    Procedures Procedures    Medications Ordered in ED Medications  HYDROcodone-acetaminophen (NORCO/VICODIN) 5-325 MG per tablet 1 tablet (has no administration in time range)  acetaminophen (TYLENOL) tablet 650 mg (has no administration in time range)  HYDROcodone-acetaminophen (NORCO/VICODIN) 5-325 MG per tablet 1 tablet (1 tablet Oral Given 02/10/23 1540)    ED Course/ Medical Decision Making/ A&P                             Medical Decision Making Amount and/or Complexity of Data Reviewed Radiology: ordered.  Risk OTC drugs. Prescription drug management.   52 year old male comes in with chief complaint of fall. Patient is primarily complaining of pain over his right knee and right thumb.  He did not strike his head.  The injury occurred more than 8 hours ago, patient has history of thrombocytopenia but he denies any headache, neck pain, nausea, vomiting, seizure-like activity or confusion.  I feel comfortable clearing brain and C-spine clinically.  Differential diagnosis considered for this patient includes knee sprain, knee fracture, toe  fracture, toe sprain, tear of ACL.  Based on patient's exam, right knee x-ray and right foot x-ray were ordered.  I have independently interpreted patient's x-ray of the knee and the foot.  There is clear evidence of great toe fracture and great toe arthritis.  Results of the ED workup discussed with the patient. We will put him in a knee immobilizer.  Also his toes have been buddy taped and he will get cam walker boot.  Patient going to the beach, so we will try to protect his fracture.  Patient states that he would not be comfortable using crutches.  He has cane that he is already using.  Final Clinical Impression(s) / ED Diagnoses Final diagnoses:  Sprain of right knee, unspecified ligament, initial encounter  Closed nondisplaced fracture of distal phalanx of right great toe,  initial encounter    Rx / DC Orders ED Discharge Orders          Ordered    HYDROcodone-acetaminophen (NORCO/VICODIN) 5-325 MG tablet  Every 8 hours PRN,   Status:  Discontinued        02/10/23 1525    acetaminophen (TYLENOL) 500 MG tablet  Every 6 hours PRN,   Status:  Discontinued        02/10/23 1525    acetaminophen (TYLENOL) 500 MG tablet  Every 6 hours PRN        02/10/23 1607    HYDROcodone-acetaminophen (NORCO/VICODIN) 5-325 MG tablet  Every 8 hours PRN        02/10/23 1607              Derwood Kaplan, MD 02/10/23 1608

## 2023-02-10 NOTE — Discharge Instructions (Addendum)
You are seen in the ER for the fall.  As discussed, you have clear evidence of fractured that toe.  Knee x-rays reassuring.  However you likely have a knee sprain.  Take the pain medication only if the pain is severe.  Take Tylenol around-the-clock for pain control.  Ice your knee aggressively for the next 2 days to reduce inflammation.  For your toe fracture, cam walker boot and buddy tape/splint has been applied.  Call the orthopedist for a follow-up in 2 weeks.

## 2023-02-10 NOTE — ED Triage Notes (Addendum)
Patient here POV from Home.  Endorses Fall today when he was walking in the bathroom and injured his Right Leg. Pain to Right Lateral Tib/Fib, Right Large Toe and Posterior Knee. No head injury or LOC. Occurred at 0400 today.   NAD Noted during Triage. A&Ox4. Gcs 15. Ambulatory with Cane.

## 2023-02-24 ENCOUNTER — Other Ambulatory Visit (INDEPENDENT_AMBULATORY_CARE_PROVIDER_SITE_OTHER): Payer: Medicare Other

## 2023-02-24 ENCOUNTER — Ambulatory Visit: Payer: Medicare Other | Admitting: Physician Assistant

## 2023-02-24 ENCOUNTER — Encounter: Payer: Self-pay | Admitting: Physician Assistant

## 2023-02-24 DIAGNOSIS — S92401S Displaced unspecified fracture of right great toe, sequela: Secondary | ICD-10-CM | POA: Diagnosis not present

## 2023-02-24 DIAGNOSIS — T148XXA Other injury of unspecified body region, initial encounter: Secondary | ICD-10-CM

## 2023-02-24 DIAGNOSIS — S92403A Displaced unspecified fracture of unspecified great toe, initial encounter for closed fracture: Secondary | ICD-10-CM | POA: Insufficient documentation

## 2023-02-24 DIAGNOSIS — M79661 Pain in right lower leg: Secondary | ICD-10-CM

## 2023-02-24 DIAGNOSIS — M25561 Pain in right knee: Secondary | ICD-10-CM

## 2023-02-24 NOTE — Progress Notes (Signed)
Office Visit Note   Patient: Ricky [name]           Date of Birth: 05-02-1971           MRN: [id number] Visit Date: 02/24/2023              Requested by: Maryagnes Amos, FNP [address] [place],  [place] [address] PCP: Maryagnes Amos, FNP   Assessment & Plan: Visit Diagnoses:  1. Fracture   2. Closed fracture of phalanx of right great toe, physeal involvement unspecified, unspecified phalanx, sequela   3. Acute pain of right knee     Plan: Does have comminuted fracture of the proximal phalanx of the great toe.  Still has a lot of swelling and ecchymosis but is neurovascular intact I would expect to go on to heal this uneventfully will continue with boot for at least 2 more weeks.  Will follow-up at that time for new x-rays of the great toe.  Do not see any intra-articular difficulties with his knee no effusion no ecchymosis.  Most of his pain is in the posterior knee goes down laterally.  Does have a history of arthritis for which she gets injections elsewhere.  He did complain of a lot of lateral pain along his fibula just below where his knee x-rays shown.  I do believe after looking at the x-rays and where he is tender he may have a nondisplaced fracture of the fibular shaft.  Again nothing different to do about this.  Will follow-up in 2 weeks at which time x-rays of his great toe and fibula should be taken discussed with him doing lots of range of motion exercises with his ankle given the nature of his knee and leg injury.  Follow-Up Instructions: No follow-ups on file.   Orders:  Orders Placed This Encounter  Procedures   XR Toe Great Right   No orders of the defined types were placed in this encounter.     Procedures: No procedures performed   Clinical Data: No additional findings.   Subjective: Chief Complaint  Patient presents with   Right Foot - Follow-up    GT fracture    HPI Ricky [name] is a pleasant 52 year old gentleman  who is 2 weeks status post fall when he was getting up at night and he tripped over a rug.  He was seen in an emergency room.  X-rays of his right foot which is painful to him and his right knee were taken.  He does have arthritic changes in his right knee but he sees an arthritis clinic and is having this followed up there as he gets periodic injections.  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  With regards to his knee his pain is actually more tender to the lateral side of his leg and down into his fibula.  Rates his pain is moderate.  Review of Systems  All other systems reviewed and are negative.    Objective: Vital Signs: There were no vitals taken for this visit.  Physical Exam Constitutional:      Appearance: Normal appearance.  Pulmonary:     Effort: Pulmonary effort is normal.  Skin:    General: Skin is warm and dry.  Neurological:     General: No focal deficit present.     Mental Status: He is  alert and oriented to person, place, and time.     Ortho Exam Right foot strong pulses moderate soft tissue swelling and over the right great toe.  Tender to palpation over the proximal phalanx but is able to wiggle his toe.  Has strong pulses brisk capillary refill foot is warm.  No tenderness with manipulation of the midfoot Right knee no effusion no erythema.  He has good varus valgus stability good endpoint on anterior draw not really tender over the medial lateral meniscus.  Does have some tenderness radiating down the fibula neurovascular intact compartments are soft and compressible negative Denna Haggard' sign he is able to sustain a straight leg raise.  His compartments are soft nontender negative Denna Haggard' sign Specialty Comments:  No specialty comments available.  Imaging: No results found.   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   Eval Dr Trey Sailors, Neurosurgery 10/13   Chest pain    Depression    High cholesterol    Hypertension    Osteoarthritis    Osteoarthritis    Overweight    SOB (shortness of breath)    Thrombocytopenia (HCC) 07/07/2012   138,000 08/26/11; 98,000 06/29/12    Family History  Problem Relation Age of Onset   Diabetes Mother    Hypertension Mother    Hyperlipidemia Mother    Stroke Mother    Anxiety disorder Mother    Obesity Mother    Hypertension Father    Hyperlipidemia Father    Colon cancer Neg Hx    Colon polyps Neg Hx    Esophageal cancer Neg Hx    Liver cancer Neg Hx     Past Surgical History:  Procedure Laterality Date   ADENOIDECTOMY     ANTERIOR FUSION CERVICAL SPINE     APPENDECTOMY     BOWEL RESECTION N/A 05/24/2021   Procedure: SMALL BOWEL RESECTION;  Surgeon: Griselda Miner, MD;  Location: [facility] OR;  Service: General;  Laterality: N/A;   HERNIA REPAIR     IRRIGATION AND DEBRIDEMENT SEBACEOUS CYST     on scalp   KNEE ARTHROSCOPY     x2   LAPAROSCOPY N/A 05/24/2021   Procedure: LAPAROSCOPY DIAGNOSTIC;  Surgeon: Griselda Miner, MD;  Location: [facility] OR;  Service: General;  Laterality: N/A;   LAPAROTOMY N/A 05/24/2021   Procedure: EXPLORATORY LAPAROTOMY;  Surgeon: Griselda Miner, MD;  Location: [facility] OR;  Service: General;  Laterality: N/A;   LUMBAR FUSION     POSTERIOR FUSION CERVICAL SPINE     TONSILLECTOMY      Social History   Occupational History   Occupation: Disabled  Tobacco Use   Smoking status: Former    Packs/day: 0.50    Years: 6.00    Additional pack years: 0.00    Total pack years: 3.00    Types: Cigarettes    Quit date: 2013    Years since quitting: 11.5   Smokeless tobacco:  Never  Vaping Use   Vaping Use: Never used  Substance and Sexual Activity   Alcohol use: Not Currently    Comment: A glass of wine   Drug use: No   Sexual activity: Not on file

## 2023-03-09 ENCOUNTER — Encounter: Payer: Self-pay | Admitting: Physician Assistant

## 2023-03-09 ENCOUNTER — Ambulatory Visit: Payer: Medicare Other | Admitting: Physician Assistant

## 2023-03-09 ENCOUNTER — Other Ambulatory Visit (INDEPENDENT_AMBULATORY_CARE_PROVIDER_SITE_OTHER): Payer: Medicare Other

## 2023-03-09 VITALS — Ht 72.0 in | Wt 271.0 lb

## 2023-03-09 DIAGNOSIS — S92401D Displaced unspecified fracture of right great toe, subsequent encounter for fracture with routine healing: Secondary | ICD-10-CM

## 2023-03-09 DIAGNOSIS — S82201G Unspecified fracture of shaft of right tibia, subsequent encounter for closed fracture with delayed healing: Secondary | ICD-10-CM

## 2023-03-09 DIAGNOSIS — S82401G Unspecified fracture of shaft of right fibula, subsequent encounter for closed fracture with delayed healing: Secondary | ICD-10-CM

## 2023-03-09 DIAGNOSIS — S92401S Displaced unspecified fracture of right great toe, sequela: Secondary | ICD-10-CM

## 2023-03-09 DIAGNOSIS — M25561 Pain in right knee: Secondary | ICD-10-CM

## 2023-03-09 NOTE — Progress Notes (Signed)
Office Visit Note   Patient: Ricky [name]           Date of Birth: Sep 20, 1970           MRN: [id number] Visit Date: 03/09/2023              Requested by: Maryagnes Amos, FNP [address] [place],  [place] [address] PCP: Maryagnes Amos, FNP  Chief Complaint  Patient presents with   Right Great Toe - Follow-up, Pain   Right Leg - Follow-up, Pain      HPI: Patient is now 4 weeks status post nondisplaced fibular shaft fracture and proximal phalanx right great toe fracture.  He has been using a knee brace as well as a short cam boot.  He is seeing improvement in the pain in his great toe though still has quite a bit of swelling.  Does have pain where the boot and the brace meet.  Denies any calf pain no fever chills  Assessment & Plan: Visit Diagnoses:  1. Closed fracture of phalanx of right great toe, physeal involvement unspecified, unspecified phalanx, sequela   2. Acute pain of right knee     Plan: He does have good bony callus around his fibula fracture.  No evidence of displacement.  As well as good bony bridging around the proximal phalanx fracture.  Would like to see him 1-1 time in a couple weeks.  Will put him in a postop shoe should continue to use his knee immobilizer  Follow-Up Instructions: No follow-ups on file.   Ortho Exam  Patient is alert, oriented, no adenopathy, well-dressed, normal affect, normal respiratory effort. He has a strong dorsalis pulse in his right foot compartments are soft and compressible still has some tenderness over the base of the great toe but well-maintained alignment no cellulitis he is neurovascular intact Right fibula he has some tenderness to deep palpation over the fibular shaft.  He has good dorsiflexion plantarflexion eversion inversion.  No tenderness over the knee compartments are soft and compressible negative Homans' sign  Imaging: XR Tibia/Fibula Right  Result Date: 03/09/2023 2 views of his  tib-fib are evaluated today.  He does have a proximal fibula fracture with good bony callus.  No displacement.  XR Toe Great Right  Result Date: 03/09/2023 Radiographs of his right great toe were taken today.  He does have continued fracture of the proximal phalanx of the great toe.  Does have some bony bridging.  No other abnormalities other than consistent hallux rigidus  No images are attached to the encounter.  Labs: Lab Results  Component Value Date   HGBA1C 6.3 (H) 11/28/2022   HGBA1C 6.6 (H) 04/05/2020   HGBA1C 6.6 (H) 01/31/2020   REPTSTATUS 08/03/2014 FINAL 07/31/2014   GRAMSTAIN  07/31/2014    NO WBC SEEN NO SQUAMOUS EPITHELIAL CELLS SEEN NO ORGANISMS SEEN Performed at [facility]    CULT  07/31/2014    FEW PROTEUS MIRABILIS Performed at [facility]    LABORGA PROTEUS MIRABILIS 07/31/2014     Lab Results  Component Value Date   ALBUMIN 4.3 10/06/2022   ALBUMIN 2.8 (L) 05/27/2021   ALBUMIN 3.0 (L) 05/26/2021    Lab Results  Component Value Date   MG 2.2 11/28/2022   MG 1.9 10/06/2022   MG 2.3 04/28/2022   Lab Results  Component Value Date   VD25OH 17.2 (L) 04/05/2020    No results found for: "PREALBUMIN"  Latest Ref Rng & Units 10/06/2022   12:38 PM 11/06/2021   11:34 AM 05/23/2021    3:41 PM  CBC EXTENDED  WBC 4.0 - 10.5 K/uL 7.1  5.4  6.0   RBC 4.22 - 5.81 MIL/uL 5.09  5.10  4.95   Hemoglobin 13.0 - 17.0 g/dL 98.1  19.1  47.8   HCT 39.0 - 52.0 % 44.5  45.3  44.2   Platelets 150 - 400 K/uL 183  126  130   NEUT# 1.7 - 7.7 K/uL 4.1   2.9   Lymph# 0.7 - 4.0 K/uL 2.3   2.4      Body mass index is 36.75 kg/m.  Orders:  Orders Placed This Encounter  Procedures   XR Tibia/Fibula Right   XR Toe Great Right   No orders of the defined types were placed in this encounter.    Procedures: No procedures performed  Clinical Data: No additional findings.  ROS:  All other systems negative, except as noted in the HPI. Review  of Systems  Objective: Vital Signs: Ht 6' (1.829 m)   Wt 271 lb (122.9 kg)   BMI 36.75 kg/m   Specialty Comments:  No specialty comments available.  PMFS History: Patient Active Problem List   Diagnosis Date Noted   Fracture of phalanx of great toe 02/24/2023   Pain in right knee 02/24/2023   Small bowel obstruction (HCC) 05/23/2021   Mixed hyperlipidemia 05/23/2021   Hypokalemia due to excessive gastrointestinal loss of potassium 05/23/2021   OSA (obstructive sleep apnea) 10/18/2019   PVC's (premature ventricular contractions) 08/30/2019   Essential hypertension 08/18/2019   Thrombocytopenia (HCC) 07/07/2012   Cervical disc herniation 07/07/2012   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   Eval Dr Trey Sailors, Neurosurgery 10/13   Chest pain    Depression    High cholesterol    Hypertension    Osteoarthritis    Osteoarthritis    Overweight    SOB (shortness of breath)    Thrombocytopenia (HCC) 07/07/2012   138,000 08/26/11; 98,000 06/29/12    Family History  Problem Relation Age of Onset   Diabetes Mother    Hypertension Mother    Hyperlipidemia Mother    Stroke Mother    Anxiety disorder Mother    Obesity Mother    Hypertension Father    Hyperlipidemia Father    Colon cancer Neg Hx    Colon polyps Neg Hx    Esophageal cancer Neg Hx    Liver cancer Neg Hx     Past Surgical History:  Procedure Laterality Date   ADENOIDECTOMY     ANTERIOR FUSION CERVICAL SPINE     APPENDECTOMY     BOWEL RESECTION N/A 05/24/2021   Procedure: SMALL BOWEL RESECTION;  Surgeon: Griselda Miner, MD;  Location: [facility] OR;  Service: General;  Laterality: N/A;   HERNIA REPAIR     IRRIGATION AND DEBRIDEMENT SEBACEOUS CYST     on scalp   KNEE ARTHROSCOPY     x2   LAPAROSCOPY N/A 05/24/2021   Procedure: LAPAROSCOPY DIAGNOSTIC;  Surgeon: Griselda Miner, MD;  Location: [facility] OR;  Service: General;  Laterality: N/A;   LAPAROTOMY N/A  05/24/2021   Procedure: EXPLORATORY LAPAROTOMY;  Surgeon: Griselda Miner, MD;  Location: [facility] OR;  Service: General;  Laterality: N/A;   LUMBAR FUSION     POSTERIOR FUSION CERVICAL SPINE  TONSILLECTOMY     Social History   Occupational History   Occupation: Disabled  Tobacco Use   Smoking status: Former    Current packs/day: 0.00    Average packs/day: 0.5 packs/day for 6.0 years (3.0 ttl pk-yrs)    Types: Cigarettes    Start date: 2007    Quit date: 2013    Years since quitting: 11.5   Smokeless tobacco: Never  Vaping Use   Vaping status: Never Used  Substance and Sexual Activity   Alcohol use: Not Currently    Comment: A glass of wine   Drug use: No   Sexual activity: Not on file

## 2023-03-23 ENCOUNTER — Ambulatory Visit: Payer: Medicare Other | Admitting: Physician Assistant

## 2023-04-07 ENCOUNTER — Other Ambulatory Visit: Payer: Self-pay | Admitting: Cardiology

## 2023-04-23 ENCOUNTER — Other Ambulatory Visit: Payer: Self-pay

## 2023-04-23 ENCOUNTER — Emergency Department (HOSPITAL_BASED_OUTPATIENT_CLINIC_OR_DEPARTMENT_OTHER): Payer: Medicare Other

## 2023-04-23 ENCOUNTER — Encounter (HOSPITAL_BASED_OUTPATIENT_CLINIC_OR_DEPARTMENT_OTHER): Payer: Self-pay | Admitting: Pediatrics

## 2023-04-23 ENCOUNTER — Other Ambulatory Visit (HOSPITAL_BASED_OUTPATIENT_CLINIC_OR_DEPARTMENT_OTHER): Payer: Self-pay

## 2023-04-23 ENCOUNTER — Emergency Department (HOSPITAL_BASED_OUTPATIENT_CLINIC_OR_DEPARTMENT_OTHER)
Admission: EM | Admit: 2023-04-23 | Discharge: 2023-04-23 | Disposition: A | Discharge status: Home / Self Care | Payer: Medicare Other | Attending: Emergency Medicine | Admitting: Emergency Medicine

## 2023-04-23 DIAGNOSIS — K429 Umbilical hernia without obstruction or gangrene: Secondary | ICD-10-CM | POA: Diagnosis not present

## 2023-04-23 DIAGNOSIS — R109 Unspecified abdominal pain: Secondary | ICD-10-CM

## 2023-04-23 DIAGNOSIS — R1033 Periumbilical pain: Secondary | ICD-10-CM | POA: Diagnosis present

## 2023-04-23 LAB — COMPREHENSIVE METABOLIC PANEL
ALT: 15 U/L (ref 0–44)
AST: 20 U/L (ref 15–41)
Albumin: 4.3 g/dL (ref 3.5–5.0)
Alkaline Phosphatase: 102 U/L (ref 38–126)
Anion gap: 8 (ref 5–15)
BUN: 17 mg/dL (ref 6–20)
CO2: 32 mmol/L (ref 22–32)
Calcium: 9.1 mg/dL (ref 8.9–10.3)
Chloride: 100 mmol/L (ref 98–111)
Creatinine, Ser: 0.93 mg/dL (ref 0.61–1.24)
GFR, Estimated: >60 mL/min (ref >60)
Glucose, Bld: 108 mg/dL — ABNORMAL HIGH (ref 70–99)
Potassium: 3.7 mmol/L (ref 3.5–5.1)
Sodium: 140 mmol/L (ref 135–145)
Total Bilirubin: 0.8 mg/dL (ref 0.3–1.2)
Total Protein: 7.8 g/dL (ref 6.5–8.1)

## 2023-04-23 LAB — CBC
HCT: 45.1 % (ref 39.0–52.0)
Hemoglobin: 14.6 g/dL (ref 13.0–17.0)
MCH: 28.5 pg (ref 26.0–34.0)
MCHC: 32.4 g/dL (ref 30.0–36.0)
MCV: 87.9 fL (ref 80.0–100.0)
Platelets: 174 10*3/uL (ref 150–400)
RBC: 5.13 MIL/uL (ref 4.22–5.81)
RDW: 14.4 % (ref 11.5–15.5)
WBC: 6.2 10*3/uL (ref 4.0–10.5)
nRBC: 0 % (ref 0.0–0.2)

## 2023-04-23 LAB — URINALYSIS, ROUTINE W REFLEX MICROSCOPIC
Bacteria, UA: NONE SEEN
Bilirubin Urine: NEGATIVE
Glucose, UA: NEGATIVE mg/dL
Hgb urine dipstick: NEGATIVE
Ketones, ur: NEGATIVE mg/dL
Leukocytes,Ua: NEGATIVE
Nitrite: NEGATIVE
Protein, ur: 30 mg/dL — AB
Specific Gravity, Urine: >1.046 — ABNORMAL HIGH (ref 1.005–1.030)
pH: 7 (ref 5.0–8.0)

## 2023-04-23 LAB — LIPASE, BLOOD: Lipase: 32 U/L (ref 11–51)

## 2023-04-23 MED ORDER — POLYETHYLENE GLYCOL 3350 17 GM/SCOOP PO POWD
17.0000 g | Freq: Every day | ORAL | 0 refills | Status: AC
  Filled 2023-04-23: qty 510, 30d supply, fill #0

## 2023-04-23 MED ORDER — SENNOSIDES-DOCUSATE SODIUM 8.6-50 MG PO TABS
1.0000 | ORAL_TABLET | Freq: Every day | ORAL | 0 refills | Status: AC
  Filled 2023-04-23: qty 100, 100d supply, fill #0

## 2023-04-23 MED ORDER — LACTATED RINGERS IV BOLUS
1000.0000 mL | Freq: Once | INTRAVENOUS | Status: AC
  Administered 2023-04-23: 1000 mL via INTRAVENOUS

## 2023-04-23 MED ORDER — IOHEXOL 350 MG/ML SOLN
100.0000 mL | Freq: Once | INTRAVENOUS | Status: AC | PRN
  Administered 2023-04-23: 100 mL via INTRAVENOUS

## 2023-04-23 NOTE — ED Triage Notes (Signed)
C/O abdominal pain aassoc w/ enlarging hernia over the last couple of weeks. Denies NVD.

## 2023-04-23 NOTE — Discharge Instructions (Addendum)
It was a pleasure caring for you today in the emergency department.  Start eating a small amount of food at meals, and gradually increase the amount. If your pain worsens, inability to tolerate food, vomiting, firmness in abdomen, please return to care. At this time, you do not have a bowel obstruction which is good news. Please follow back up with your general surgeon (Dr. Lindell Noe at [phone]) today for follow up to discuss hernia repair.    Get plenty of rest over the next few days, drink plenty of fluids. Please follow up with your PCP in the next 3-5 days for a recheck. Please return to the emergency department for any worsening or worrisome symptoms.

## 2023-04-23 NOTE — ED Provider Notes (Signed)
[place] EMERGENCY DEPARTMENT AT [facility] Provider Note   CSN: [id number] Arrival date & time: 04/23/23  1028     History  Chief Complaint  Patient presents with   Abdominal Pain    Ricky [name] is a 52 y.o. male with postprandial periumbilical abdominal tenderness that started a few days ago, has been worsening. He is passing flatus. No fever, chills. No nausea, vomiting.  Last BM 2 days ago. He thought walking might help with the pain. He played cornhole for 2 hours yesterday. Eating and drinking as usual, but he is feeling fuller lately after eating a small amount of food. He has and umbilical hernia, and does say that it pops in and out, but does not get stuck.  He had recurrent SBO in 2022, now s/p segmental bowel resection.  Also s/p appendicectomy.     Home Medications Prior to Admission medications   Medication Sig Start Date End Date Taking? Authorizing Provider  polyethylene glycol powder (GLYCOLAX/MIRALAX) 17 GM/SCOOP powder Take 17 g by mouth daily. 04/23/23  Yes Livio Ledwith, Nolberto Hanlon, DO  senna-docusate (SENOKOT-S) 8.6-50 MG tablet Take 1 tablet by mouth daily. 04/23/23  Yes Aaliya Maultsby, Nolberto Hanlon, DO  acetaminophen (TYLENOL) 500 MG tablet Take 1 tablet (500 mg total) by mouth every 6 (six) hours as needed. 02/10/23   Derwood Kaplan, MD  atorvastatin (LIPITOR) 40 MG tablet Take 1 tablet (40 mg total) by mouth daily. 12/01/22 11/26/23  Little Ishikawa, MD  Blood Pressure Monitoring (BLOOD PRESSURE CUFF) MISC XL Adult blood pressure cuff 08/18/19   Little Ishikawa, MD  carvedilol (COREG) 25 MG tablet TAKE 1 TABLET(25 MG) BY MOUTH TWICE DAILY WITH A MEAL 11/04/21   Little Ishikawa, MD  cyclobenzaprine (FLEXERIL) 10 MG tablet Take 10 mg by mouth 3 (three) times daily. 05/29/21   [provider]  EDARBYCLOR 40-25 MG TABS TAKE 1 TABLET DAILY (DOSE INCREASE) 12/23/22   Little Ishikawa, MD  hydrOXYzine (VISTARIL) 50 MG capsule Take 50 mg by  mouth daily as needed.    [provider]  ibuprofen (ADVIL) 200 MG tablet Take 400 mg by mouth every 6 (six) hours as needed for fever, headache or mild pain.    [provider]  ondansetron (ZOFRAN-ODT) 4 MG disintegrating tablet Take 1 tablet (4 mg total) by mouth every 8 (eight) hours as needed for nausea or vomiting. 11/06/21   Ernie Avena, MD  potassium chloride SA (KLOR-CON M20) 20 MEQ tablet TAKE 1 TABLET DAILY 11/11/22   Little Ishikawa, MD  sildenafil (VIAGRA) 100 MG tablet TAKE 1/2 TO 1 TABLET BY MOUTH AS NEEDED. Defer further refills to PCP. 04/04/21   Little Ishikawa, MD      Allergies    Patient has no known allergies.    Review of Systems   Review of Systems  Constitutional:  Negative for chills and fever.  Gastrointestinal:  Positive for abdominal pain. Negative for blood in stool, constipation, diarrhea and vomiting.       Abdominal bloating  Genitourinary:  Negative for urgency.    Physical Exam Updated Vital Signs BP (!) 113/94 (BP Location: Left Arm)   Pulse 74   Temp 98.1 F (36.7 C) (Oral)   Resp 16   Ht 6' (1.829 m)   Wt 122.5 kg   SpO2 100%   BMI 36.62 kg/m  Physical Exam Constitutional:      General: He is not in acute distress.    Appearance: He is not  ill-appearing or toxic-appearing.  Cardiovascular:     Rate and Rhythm: Normal rate and regular rhythm.  Pulmonary:     Effort: Pulmonary effort is normal.     Breath sounds: Normal breath sounds.  Abdominal:     General: Abdomen is protuberant. Bowel sounds are normal. There is no distension or abdominal bruit. There are no signs of injury.     Palpations: Abdomen is soft.     Hernia: A hernia is present. Hernia is present in the umbilical area.  Neurological:     Mental Status: He is alert.     ED Results / Procedures / Treatments   Labs (all labs ordered are listed, but only abnormal results are displayed) Labs Reviewed  COMPREHENSIVE METABOLIC PANEL -  Abnormal; Notable for the following components:      Result Value   Glucose, Bld 108 (*)    All other components within normal limits  URINALYSIS, ROUTINE W REFLEX MICROSCOPIC - Abnormal; Notable for the following components:   Specific Gravity, Urine >1.046 (*)    Protein, ur 30 (*)    All other components within normal limits  LIPASE, BLOOD  CBC    EKG None  Radiology CT Angio Abd/Pel w/ and/or w/o  Result Date: 04/23/2023 CLINICAL DATA:  52 year old male with history of periumbilical abdominal pain, history of bowel resection. EXAM: CT ANGIOGRAPHY ABDOMEN AND PELVIS WITH CONTRAST AND WITHOUT CONTRAST TECHNIQUE: Multidetector CT imaging of the abdomen and pelvis was performed using the standard protocol during bolus administration of intravenous contrast. Multiplanar reconstructed images and MIPs were obtained and reviewed to evaluate the vascular anatomy. RADIATION DOSE REDUCTION: This exam was performed according to the departmental dose-optimization program which includes automated exposure control, adjustment of the mA and/or kV according to patient size and/or use of iterative reconstruction technique. CONTRAST:  OMNIPAQUE IOHEXOL 350 MG/ML SOLN COMPARISON:  CT abdomen pelvis from 05/23/2021 FINDINGS: VASCULAR Aorta: Normal caliber aorta without aneurysm, dissection, vasculitis or significant stenosis. Celiac: Patent without evidence of aneurysm, dissection, vasculitis or significant stenosis. Replaced left hepatic artery arising from the left gastric artery. SMA: Patent without evidence of aneurysm, dissection, vasculitis or significant stenosis. Renals: Single bilateral renal arteries are patent without evidence of aneurysm, dissection, vasculitis, fibromuscular dysplasia or significant stenosis. IMA: Patent without evidence of aneurysm, dissection, vasculitis or significant stenosis. Inflow: Patent without evidence of aneurysm, dissection, vasculitis or significant stenosis. Proximal  Outflow: Bilateral common femoral and visualized portions of the superficial and profunda femoral arteries are patent without evidence of aneurysm, dissection, vasculitis or significant stenosis. Veins: The hepatic veins are widely patent. The portal system is widely patent and normal in caliber. The renal veins are patent bilaterally in standard anatomic configuration. No evidence of iliocaval thrombosis or anomaly. Review of the MIP images confirms the above findings. NON-VASCULAR Lower chest: No acute abnormality. Hepatobiliary: No focal liver abnormality is seen. No gallstones, gallbladder wall thickening, or biliary dilatation. Pancreas: Unremarkable. No pancreatic ductal dilatation or surrounding inflammatory changes. Spleen: Normal in size without focal abnormality. Adrenals/Urinary Tract: Adrenal glands are unremarkable. Kidneys are normal, without renal calculi, focal lesion, or hydronephrosis. Bladder is unremarkable. Stomach/Bowel: Stomach is within normal limits. 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 Appendix appears surgically absent. No evidence of bowel wall thickening, distention, or inflammatory changes. Lymphatic: No abdominopelvic lymphadenopathy. Reproductive: Prostate is unremarkable. Other: About the ventral midline supraumbilical region is an approximately 3.6 cm fascial defect containing a loop of small bowel. Musculoskeletal: No acute osseous abnormality. Similar  appearing postsurgical changes after L5-S1 PSIF without complicating features. IMPRESSION: VASCULAR No acute abdominopelvic vascular abnormality. NON-VASCULAR 1. Supraumbilical ventral hernia containing loop of small bowel without evidence of strangulation or obstruction. 2. 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 Marliss Coots, MD [facility] Electronically Signed   By: Marliss Coots M.D.   On:  04/23/2023 13:07    Procedures Procedures    Medications Ordered in ED Medications  lactated ringers bolus 1,000 mL (0 mLs Intravenous Stopped 04/23/23 1301)  iohexol (OMNIPAQUE) 350 MG/ML injection 100 mL (100 mLs Intravenous Contrast Given 04/23/23 1148)    ED Course/ Medical Decision Making/ A&P                                 Medical Decision Making 52 year old male with history of multiple abdominal surgeries (appendectomy, SBO segment resection) with abdominal bloating, fullness and postprandial pain. CT angio showed supraumbilical hernia without strangulation or obstruction.  No SBO. Reassuringly, ANO x 4, well-appearing and abdominal exam benign with reducible periumbilical hernia, and no signs of peritonitis or incarcerated/strangulated hernia. No concern for pancreatitis at this time.  Discharged home in stable condition with instructions to resume regular diet, but with small amounts of food.  Provided instructions to call outpatient general surgeon for evaluation for hernia repair.  Prescribed MiraLAX and senna to help with constipation.  Return precautions discussed should he have incarceration/strangulation of hernia, inability to tolerate food or fluid, worsening pain.   Amount and/or Complexity of Data Reviewed Labs: ordered. Radiology: ordered.  Risk OTC drugs. Prescription drug management.          Final Clinical Impression(s) / ED Diagnoses Final diagnoses:  Abdominal pain, unspecified abdominal location  Umbilical hernia without obstruction and without gangrene    Rx / DC Orders ED Discharge Orders          Ordered    polyethylene glycol powder (GLYCOLAX/MIRALAX) 17 GM/SCOOP powder  Daily        04/23/23 1345    senna-docusate (SENOKOT-S) 8.6-50 MG tablet  Daily        04/23/23 1345              Delynda Sepulveda, Nolberto Hanlon, DO 04/23/23 1359    Franne Forts, DO 04/28/23 0013

## 2023-04-27 ENCOUNTER — Other Ambulatory Visit: Payer: Self-pay | Admitting: Thoracic Surgery (Cardiothoracic Vascular Surgery)

## 2023-04-27 DIAGNOSIS — I7121 Aneurysm of the ascending aorta, without rupture: Secondary | ICD-10-CM

## 2023-05-11 ENCOUNTER — Other Ambulatory Visit: Payer: Self-pay | Admitting: Cardiology

## 2023-05-13 MED ORDER — POTASSIUM CHLORIDE CRYS ER 20 MEQ PO TBCR: 20.0000 meq | EXTENDED_RELEASE_TABLET | Freq: Every day | ORAL | 1 refills | Status: DC

## 2023-05-19 ENCOUNTER — Ambulatory Visit: Payer: Self-pay | Admitting: General Surgery

## 2023-05-25 NOTE — Progress Notes (Unsigned)
Cardiology Office Note    Date:  05/26/2023  ID:  Ricky [name], DOB Mar 21, 1971, MRN [id number] PCP:  Maryagnes Amos, FNP  Cardiologist:  Little Ishikawa, MD  Electrophysiologist:  None   Chief Complaint: Follow up for hypertension and PVCs  History of Present Illness: .    Ricky [name] is a 52 y.o. male with visit-pertinent history of frequent PVCs, thoracic aortic aneurysm, OSA, hypertension, thrombocytopenia.  Initially seen in 07/2019 for evaluation of PVCs.  TTE in 07/2019 showed low normal systolic function EF 50 to 55%, septal hypokinesis.  It also showed an ascending aortic aneurysm measuring up to 48 mm.  CTA chest in 07/2019 showed ascending aortic aneurysm measuring up to 47 mm.  Cardiac monitor showed frequent PVCs (23%) and 120 episodes of SVT, lasting up to 1 minute.  Lexiscan Myoview in 07/2019 showed no evidence of ischemia.  He was referred to Dr. Ladona Ridgel in EP for evaluation of PVCs, recommended uptitrating his beta-blocker as initial treatment and repeat monitor once on Coreg 25 mg twice daily.  Repeat monitor in 10/2019 showed significant improvement in PVC burden at 5.3%.  Coronary CTA in 11/2019 showed calcium score of 19 which was 84th percentile, minimal nonobstructive CAD and mid LAD.  Cardiac MRI in 02/2021 showed no LGE, EF 53%, dilated aortic root measuring 47 mm, normal RV function.  He was last seen in clinic by Dr. Bjorn Pippin in 11/2022.  He had remained stable from a cardiac perspective.  He did report problems with muscle cramps in his arms and his legs, he had reduced his statin dose to every other day and symptoms resolved.  On 04/23/23 he presented to the emergency room for postprandial periumbilical abdominal tenderness. CT angio showed supraumbilical hernia without strangulation or obstruction. There was no evidence of peritonitis or incarcerated/strangulated hernia. He was discharged with recommendation to follow up   Today presents for follow  up. Reports he has been doing well although he notes increased fatigue and anxiety today, understandably as his niece passed away last night. He is also planned to have hernia repair surgery later this month. His blood pressure was mildly elevated at 126/90 initially. He denies any anginal symptoms. He denies any palpitations. Reports aside from increased stress he has been doing very well. His blood pressure will occasionally run low in the morning, specifically when he uses his CPAP, as such he stopped using his CPAP over a month ago. He notes his blood pressure maybe once a month be in the 80's/40's, has had improvement since stopping CPAP use. He also notes he has been working on weight loss. He has lost 20 lbs in the last six months with the mediterranean diet.   Labwork independently reviewed: 04/23/2023: Sodium 140, potassium 3.7, creatinine 0.93, AST 20, ALT 15, hemoglobin 14.6, hematocrit 45.1  ROS: .   Today he denies chest pain, shortness of breath, lower extremity edema, fatigue, palpitations, melena, hematuria, hemoptysis, diaphoresis, weakness, presyncope, syncope, orthopnea, and PND.  All other systems are reviewed and otherwise negative. Studies Reviewed: Marland Kitchen    EKG:  EKG is ordered today, personally reviewed, demonstrating EKG Interpretation Date/Time:  Tuesday May 26 2023 11:11:33 EDT Ventricular Rate:  57 PR Interval:  160 QRS Duration:  100 QT Interval:  414 QTC Calculation: 402 R Axis:   -16  Text Interpretation: Sinus bradycardia No significant change since last tracing Confirmed by Reather Littler [phone]) on 05/26/2023 12:34:26 PM    CV Studies:  Cardiac Studies &  Procedures     STRESS TESTS  MYOCARDIAL PERFUSION IMAGING 08/18/2019  Narrative  The study is normal.  This is a low risk study.  Normal pharmacologic nuclear stress test with no evidence for prior infarct or ischemia. LVEF not calculated. Frequent PVCs.   ECHOCARDIOGRAM  ECHOCARDIOGRAM COMPLETE  07/28/2019  Narrative ECHOCARDIOGRAM REPORT    Patient Name:   Ricky [name] Date of Exam: 07/28/2019 Medical Rec #:  [id number]       Height:       72.0 in Accession #:    [id number]      Weight:       282.8 lb Date of Birth:  [birth year]       BSA:          2.47 m Patient Age:    48 years        BP:           142/98 mmHg Patient Gender: M               HR:           55 bpm. Exam Location:  [address]  Procedure: 2D Echo, 3D Echo, Cardiac Doppler and Color Doppler  Indications:    I49.3 PVC's  History:        Patient has no prior history of Echocardiogram examinations. Arrythmias:PVC, Signs/Symptoms:Dizziness/Lightheadedness; Risk Factors:Hypertension and Former Smoker. Palpitations, COVID 19 in October.  Sonographer:    Farrel Conners RDCS Referring Phys: [id number] CHRISTOPHER L SCHUMANN  IMPRESSIONS   1. Left ventricular ejection fraction, by visual estimation, is 50 to 55%. The left ventricle has low normal function. There is mildly increased left ventricular hypertrophy. 2. Mildly dilated left ventricular internal cavity size. 3. The left ventricle demonstrates regional wall motion abnormalities. Septal hypokinesis 4. Global right ventricle has normal systolic function.The right ventricular size is normal. No increase in right ventricular wall thickness. 5. Left atrial size was normal. 6. Right atrial size was normal. 7. The mitral valve is normal in structure. No evidence of mitral valve regurgitation. 8. The tricuspid valve is normal in structure. Tricuspid valve regurgitation is not demonstrated. 9. The aortic valve is tricuspid. Aortic valve regurgitation is not visualized. No evidence of aortic valve sclerosis or stenosis. 10. The inferior vena cava is normal in size with greater than 50% respiratory variability, suggesting right atrial pressure of 3 mmHg. 11. Aneurysm of the ascending aorta, measuring 48 mm.  FINDINGS Left Ventricle: Left ventricular ejection  fraction, by visual estimation, is 50 to 55%. The left ventricle has low normal function. The left ventricle demonstrates regional wall motion abnormalities. The left ventricular internal cavity size was mildly dilated left ventricle. There is mildly increased left ventricular hypertrophy. Concentric left ventricular hypertrophy. Left ventricular diastolic parameters were normal.  Right Ventricle: The right ventricular size is normal. No increase in right ventricular wall thickness. Global RV systolic function is has normal systolic function.  Left Atrium: Left atrial size was normal in size.  Right Atrium: Right atrial size was normal in size  Pericardium: There is no evidence of pericardial effusion.  Mitral Valve: The mitral valve is normal in structure. No evidence of mitral valve regurgitation.  Tricuspid Valve: The tricuspid valve is normal in structure. Tricuspid valve regurgitation is not demonstrated.  Aortic Valve: The aortic valve is tricuspid. Aortic valve regurgitation is not visualized. The aortic valve is structurally normal, with no evidence of sclerosis or stenosis.  Pulmonic Valve: The pulmonic valve was not well visualized. Pulmonic  valve regurgitation is trivial. Pulmonic regurgitation is trivial.  Aorta: Aortic dilatation noted. There is an aneurysm involving the ascending aorta. The aneurysm measures 48 mm.  Venous: The inferior vena cava is normal in size with greater than 50% respiratory variability, suggesting right atrial pressure of 3 mmHg.  IAS/Shunts: The atrial septum is grossly normal.   LEFT VENTRICLE PLAX 2D LVIDd:         6.00 cm  Diastology LVIDs:         4.33 cm  LV e' lateral:   7.34 cm/s LV PW:         1.29 cm  LV E/e' lateral: 4.8 LV IVS:        1.19 cm  LV e' medial:    5.59 cm/s LVOT diam:     2.50 cm  LV E/e' medial:  6.3 LV SV:         96 ml LV SV Index:   36.71 LVOT Area:     4.91 cm   RIGHT VENTRICLE RV S prime:     10.40  cm/s TAPSE (M-mode): 2.4 cm  LEFT ATRIUM             Index       RIGHT ATRIUM           Index LA diam:        4.40 cm 1.78 cm/m  RA Area:     22.50 cm LA Vol (A2C):   77.6 ml 31.44 ml/m RA Volume:   67.40 ml  27.30 ml/m LA Vol (A4C):   62.7 ml 25.40 ml/m LA Biplane Vol: 74.2 ml 30.06 ml/m AORTIC VALVE LVOT Vmax:   74.60 cm/s LVOT Vmean:  48.200 cm/s LVOT VTI:    0.152 m  AORTA Ao Root diam: 4.00 cm Ao Asc diam:  4.80 cm  MITRAL VALVE MV Area (PHT): cm                  SHUNTS MV PHT:        msec                 Systemic VTI:  0.15 m MV Decel Time: 309 msec             Systemic Diam: 2.50 cm MV E velocity: 35.40 cm/s 103 cm/s MV A velocity: 62.05 cm/s 70.3 cm/s MV E/A ratio:  0.57       1.5   Epifanio Lesches MD Electronically signed by Epifanio Lesches MD Signature Date/Time: 07/28/2019/1:36:55 PM    Final    MONITORS  LONG TERM MONITOR (3-14 DAYS) 11/11/2019  Narrative  Frequent PVCs (5.3% of beats)  3 episodes of SVT, longest lasting 1 minute 6 seconds.  Compared to prior monitor results from 07/2019, significant improvement in PVC burden (23% to 5%)  3 days of data recorded on Zio monitor. Patient had a min HR of 47 bpm, max HR of 176 bpm, and avg HR of 77 bpm. Predominant underlying rhythm was Sinus Rhythm. No VT, atrial fibrillation, high degree block, or pauses noted. Three episodes of SVT, longest lasting 1 minute 6 seconds.  Isolated atrial ectopy was rare (<1%). Isolated ventricular ectopy was frequent (5.3%).  Longest episode of bigeminy lasted 3 minutes 26 seconds.  Longest episode of trigeminy lasted 1 minute and 10 seconds.  There were 0 triggered events.   CT SCANS  CT CORONARY MORPH W/CTA COR W/SCORE 12/08/2019  Addendum 12/08/2019  2:28 PM ADDENDUM REPORT: 12/08/2019 14:26  ADDENDUM:  OVER-READ INTERPRETATION  CT CHEST  The following report is an over-read performed by radiologist Dr. Arliss Journey [facility] on  12/08/2019. This over-read does not include interpretation of cardiac or coronary anatomy or pathology. The coronary CTA interpretation by the cardiologist is attached.  COMPARISON:  08/16/2019  FINDINGS:  Cardiovascular: Aortic atherosclerosis. Ascending aortic dilatation. Example at 4.4 cm in the mid ascending segment on 06/16, similar. 4.2 cm on coronal image 73/20, similar. The superior most aspect of the ascending aorta, as well as the transverse aorta are not included. Normal caliber of the imaged descending thoracic aorta. No central pulmonary embolism, on this non-dedicated study.  Mediastinum/Nodes: No imaged thoracic adenopathy.  Lungs/Pleura: No pleural fluid.  Clear imaged lungs.  Upper Abdomen: Hepatic steatosis. Normal imaged portions of the stomach, spleen.  Musculoskeletal: No acute osseous abnormality. Moderate thoracic spondylosis.  IMPRESSION:  1. Similar ascending aortic dilatation, on the order of 4.4 cm. Please note that the superior most ascending and transverse aorta are not imaged on this dedicated coronary CT. Recommend annual imaging followup by CTA or MRA. This recommendation follows 2010 ACCF/AHA/AATS/ACR/ASA/SCA/SCAI/SIR/STS/SVM Guidelines for the Diagnosis and Management of Patients with Thoracic Aortic Disease. Circulation. 2010; 121: Z610-R604. Aortic aneurysm NOS (ICD10-I71.9) 2.  Aortic Atherosclerosis (ICD10-I70.0). 3. Hepatic steatosis.   Electronically Signed By: Jeronimo Greaves M.D. On: 12/08/2019 14:26  Narrative CLINICAL DATA:  Ascending aortic aneurysm, pvc's.  EXAM: Cardiac/Coronary  CTA  TECHNIQUE: The patient was scanned on a Siemens Somatoform go.Top scanner.  FINDINGS: A retrospective scan was triggered in the descending thoracic aorta. Axial non-contrast 3 mm slices were carried out through the heart. The data set was analyzed on a dedicated work station and scored using the Agatson method. Gantry rotation speed  was 330 msecs and collimation was .6 mm. 25mg  of coreg and 0.8 mg of sl NTG was given. The 3D data set was reconstructed in 5% intervals of the 5-95 % of the R-R cycle. Diastolic phases were analyzed on a dedicated work station using MPR, MIP and VRT modes. The patient received 200 cc of contrast (patient also getting ct angio chest separate study).  Aorta: Moderate dilation of ascending aorta, measuring 47mm at the level of the R. pulmonary artery. No calcifications. No dissection.  Aortic Valve:  Trileaflet.  No calcifications.  Coronary Arteries:  Normal coronary origin.  Right dominance.  RCA is a large dominant artery that gives rise to PDA and PLA. There is no plaque.  Left main is a large artery that gives rise to LAD and LCX arteries.  LAD is a large vessel that has mild calcification in the mid segment causing minimal stenosis (0-24%).  LCX is a non-dominant artery that gives rise to one large OM1 branch. There is no plaque.  Other findings:  Normal pulmonary vein drainage into the left atrium.  Normal left atrial appendage without a thrombus.  Normal size of the pulmonary artery.  IMPRESSION: 1. Coronary calcium score of 18.9. This was 84th percentile for age and sex matched control.  2. Normal coronary origin with right dominance.  3. Moderately dilated ascending aorta measuring 47mm in diameter at the level of the Right Pulmonary artery.  4.  Minimal non obstructive coronary calcification in the mid LAD  5. CAD-RADS 1. Minimal non-obstructive CAD (0-24%). Consider non-atherosclerotic causes of chest pain. Consider preventive therapy and risk factor modification.  Electronically Signed: By: Debbe Odea M.D. On: 12/08/2019 13:30   CARDIAC MRI  MR CARDIAC MORPHOLOGY W WO  CONTRAST 02/25/2021  Narrative CLINICAL DATA:  PVC, Abnormal echo R/O Sarcoid  Dilated Aortic Root  EXAM: CARDIAC MRI  TECHNIQUE: The patient was scanned on a 1.5 Tesla  Siemens magnet. A dedicated cardiac coil was used. Functional imaging was done using Fiesta sequences. 2,3, and 4 chamber views were done to assess for RWMA's. Modified Simpson's rule using a short axis stack was used to calculate an ejection fraction on a dedicated work [employer]. The patient received 10 cc of Gadavist. After 10 minutes inversion recovery sequences were used to assess for infiltration and scar tissue.  CONTRAST:  Gadavist  FINDINGS: Normal atrial sizes. No ASD/PFO. No VSD. Moderate to severely dilated aortic root 4.7 cm compared to descending thoracic aorta 2.5 cm. Normal arch vessels and no coarctation. No pericardial effusion  Normal TV, MV, PV Tri leaflet AV with mild appearing central AR. Normal coronary artery origins Mild LVE with low normal EF 53% (EDV 243 cc ESV 115 cc SV 128 cc) Normal RV size and function RVEF 49% (EDV 227 cc ESV 115 cc SV 112 cc) Delayed gadolinium images with no infiltration, infarct or scar and no evidence of sarcoid Parametric measures normal ECV 42 msec, T1 global 1003 msec and T2 49 msec  IMPRESSION: 1. Low normal LV size and function EF 53% no RWMA;s  2. No gadolinium uptake on delayed inversion recovery sequences no evidence of Sarcoid  3. Moderate to severely dilated aortic root 4.7 cm Normal arch vessels and no coarctation  4.  Tri leaflet AV with mild appearing AR  5.  Normal RV size and function RVEF 49%  6.  Normal parametric measures see values above  Charlton Haws   Electronically Signed By: Charlton Haws M.D. On: 02/25/2021 16:09           Current Reported Medications:.    Current Meds  Medication Sig   acetaminophen (TYLENOL) 500 MG tablet Take 1 tablet (500 mg total) by mouth every 6 (six) hours as needed.   Blood Pressure Monitoring (BLOOD PRESSURE CUFF) MISC XL Adult blood pressure cuff   carvedilol (COREG) 25 MG tablet TAKE 1 TABLET(25 MG) BY MOUTH TWICE DAILY WITH A MEAL    cyclobenzaprine (FLEXERIL) 10 MG tablet Take 10 mg by mouth at bedtime as needed for muscle spasms.   EDARBYCLOR 40-25 MG TABS TAKE 1 TABLET DAILY (DOSE INCREASE)   hydrOXYzine (VISTARIL) 50 MG capsule Take 50 mg by mouth at bedtime as needed (sleep).   ibuprofen (ADVIL) 800 MG tablet Take 800 mg by mouth every 8 (eight) hours as needed for moderate pain.   ondansetron (ZOFRAN-ODT) 4 MG disintegrating tablet Take 1 tablet (4 mg total) by mouth every 8 (eight) hours as needed for nausea or vomiting.   polyethylene glycol powder (GLYCOLAX/MIRALAX) 17 GM/SCOOP powder Take 17 g by mouth daily. (Patient taking differently: Take 17 g by mouth daily as needed for moderate constipation.)   potassium chloride SA (KLOR-CON M20) 20 MEQ tablet Take 1 tablet (20 mEq total) by mouth daily.   rosuvastatin (CRESTOR) 10 MG tablet Take 10 mg by mouth at bedtime.   senna-docusate (SENOKOT-S) 8.6-50 MG tablet Take 1 tablet by mouth daily.   sildenafil (VIAGRA) 100 MG tablet TAKE 1/2 TO 1 TABLET BY MOUTH AS NEEDED. Defer further refills to PCP.    Physical Exam:    VS:  BP 112/78   Pulse (!) 57   Ht 6' (1.829 m)   Wt 273 lb (123.8 kg)  BMI 37.03 kg/m    Wt Readings from Last 3 Encounters:  05/26/23 273 lb (123.8 kg)  04/23/23 270 lb (122.5 kg)  03/09/23 271 lb (122.9 kg)    GEN: Well nourished, well developed in no acute distress NECK: No JVD; No carotid bruits CARDIAC: RRR, no murmurs, rubs, gallops RESPIRATORY:  Clear to auscultation without rales, wheezing or rhonchi  ABDOMEN: Soft, non-tender, non-distended EXTREMITIES:  No edema; No acute deformity     Asessement and Plan:.    CAD: History of atypical chest pain. Lexiscan Myoview in 07/2019 showed no evidence of ischemia. Coronary CTA in 11/2019 showed calcium score of 19 which was 84th percentile, minimal nonobstructive CAD and mid LAD. Today he denies anginal symptoms. EKG shows sinus bradycardia at 57 bpm, no significant changes. Heart healthy  diet and regular cardiovascular exercise encouraged.  Congratulated on intentional weight loss. Continue azilsartan-chlorthalidone 40-25 mg daily, Coreg 25 mg twice daily and Crestor 10 mg daily.   Aortic aneurysm: 4.7 cm ascending aortic aneurysm on CTA chest in 07/2017.  He has been monitored by cardiothoracic surgery with yearly CTA chest.  Last CTA chest in 04/2022 showed stable ascending aortic aneurysm measuring 4.7 cm. Repeat CTA chest scheduled end of the month. Check BMET in three weeks, before CT angio.   Frequent PVCs: Cardiac monitor in 08/2019 showed PVC burden of 23%, symptomatic episodes on monitor correspond to episodes of bigeminy. Seen by EP, Dr. Ladona Ridgel recommended tirtrating up Coreg, repeat monitor in 10/2019 showed PVC burden of 5.3%. No evidence of sarcoidosis on cMRI. Today he denies palpitations, EKG with no evidence of PVCs. Continue carvedilol 25 mg twice daily   Hypertension: Per Dr. Bjorn Pippin SBP goal is less than 120 in setting of aortic aneurysm. Today he notes occasional low blood pressures which he associated with CPAP, he  discontinued use, encouraged to resume CPAP use. He will monitor his blood pressure at home, he will notify the office if he starts consistently having low blood pressures. With current increased stress, decided through shared decision making to continue current doses of medication with close follow up. If blood pressures are consistently low will plan to reduce azilsartan-chlorthalidone to 40-12.5 mg daily. Of note Coreg has been previously decreased for hypotension, this resulted in increased PVCs. Continue azilsartan-chlorthalidone 40-25 mg daily, carvedilol 25 mg twice daily.  Hyperlipidemia: Last lipid profile from 03/23/2023, pulled from patient's PCP mychart shows total cholesterol 98, triglyceride 102, HDL 33, LDL 46. Continue rosuvastatin 10 mg daily.  OSA: Patinet reports that he discontinued CPAP use over a month ago in setting of hypotension.  Encouraged to resume with plan to down titrate antihypertensive medications as noted above if needed.   Preoperative cardiac exam: Patient is scheduled to undergo Laparoscopic assisted ventral hernia repair with mesh on 06/08/23. Mr. Mariner's perioperative risk of a major cardiac event is 0.4% according to the Revised Cardiac Risk Index (RCRI).  Therefore, he is at low risk for perioperative complications.   His functional capacity is good at 7.99 METs according to the Duke Activity Status Index (DASI). Recommendations: According to ACC/AHA guidelines, no further cardiovascular testing needed.  The patient may proceed to surgery at acceptable risk.    Disposition: F/u with Reather Littler, NP in one month.   Signed, Rip Harbour, NP

## 2023-05-26 ENCOUNTER — Ambulatory Visit: Payer: Medicare Other | Attending: Physician Assistant | Admitting: Cardiology

## 2023-05-26 ENCOUNTER — Encounter: Payer: Self-pay | Admitting: Physician Assistant

## 2023-05-26 VITALS — BP 112/78 | HR 57 | Ht 72.0 in | Wt 273.0 lb

## 2023-05-26 DIAGNOSIS — I2510 Atherosclerotic heart disease of native coronary artery without angina pectoris: Secondary | ICD-10-CM | POA: Diagnosis not present

## 2023-05-26 DIAGNOSIS — I7121 Aneurysm of the ascending aorta, without rupture: Secondary | ICD-10-CM | POA: Diagnosis not present

## 2023-05-26 DIAGNOSIS — E785 Hyperlipidemia, unspecified: Secondary | ICD-10-CM

## 2023-05-26 DIAGNOSIS — G4733 Obstructive sleep apnea (adult) (pediatric): Secondary | ICD-10-CM

## 2023-05-26 DIAGNOSIS — I10 Essential (primary) hypertension: Secondary | ICD-10-CM

## 2023-05-26 DIAGNOSIS — Z01818 Encounter for other preprocedural examination: Secondary | ICD-10-CM

## 2023-05-26 DIAGNOSIS — I493 Ventricular premature depolarization: Secondary | ICD-10-CM

## 2023-05-26 NOTE — Patient Instructions (Signed)
Medication Instructions:  NO CHANGES *If you need a refill on your cardiac medications before your next appointment, please call your pharmacy*   Lab Work: BMET IN 3 WEEKS If you have labs (blood work) drawn today and your tests are completely normal, you will receive your results only by: MyChart Message (if you have MyChart) OR A paper copy in the mail If you have any lab test that is abnormal or we need to change your treatment, we will call you to review the results.   Testing/Procedures: NO TESTING   Follow-Up: At [facility], you and your health needs are our priority.  As part of our continuing mission to provide you with exceptional heart care, we have created designated Provider Care Teams.  These Care Teams include your primary Cardiologist (physician) and Advanced Practice Providers (APPs -  Physician Assistants and Nurse Practitioners) who all work together to provide you with the care you need, when you need it.  Your next appointment:   1 month(s)  Provider:   Reather Littler, NP     Other Instructions Check blood pressure daily and record on blood pressure log, bring to next office visit.

## 2023-05-29 NOTE — Progress Notes (Signed)
Surgical Instructions   Your procedure is scheduled on Monday, 06/08/23. Report to [facility] Main Entrance "A" at 11:30 A.M., then check in with the Admitting office. Any questions or running late day of surgery: call [phone]  Questions prior to your surgery date: call [phone], Monday-Friday, 8am-4pm. If you experience any cold or flu symptoms such as cough, fever, chills, shortness of breath, etc. between now and your scheduled surgery, please notify us at the above number.     Remember:  Do not eat after midnight the night before your surgery  You may drink clear liquids until 10:30am the morning of your surgery.   Clear liquids allowed are: Water, Non-Citrus Juices (without pulp), Carbonated Beverages, Clear Tea, Black Coffee Only (NO MILK, CREAM OR POWDERED CREAMER of any kind), and Gatorade.    Take these medicines the morning of surgery with A SIP OF WATER  atorvastatin (LIPITOR)  carvedilol (COREG)   May take these medicines IF NEEDED: acetaminophen (TYLENOL)  ondansetron (ZOFRAN-ODT)   One week prior to surgery, STOP taking any Aspirin (unless otherwise instructed by your surgeon) Aleve, Naproxen, Ibuprofen, Motrin, Advil, Goody's, BC's, all herbal medications, fish oil, and non-prescription vitamins.                     Do NOT Smoke (Tobacco/Vaping) for 24 hours prior to your procedure.  If you use a CPAP at night, you may bring your mask/headgear for your overnight stay.   You will be asked to remove any contacts, glasses, piercing's, hearing aid's, dentures/partials prior to surgery. Please bring cases for these items if needed.    Patients discharged the day of surgery will not be allowed to drive home, and someone needs to stay with them for 24 hours.  SURGICAL WAITING ROOM VISITATION Patients may have no more than 2 support people in the waiting area - these visitors may rotate.   Pre-op nurse will coordinate an appropriate time for 1 ADULT support  person, who may not rotate, to accompany patient in pre-op.  Children under the age of 52 must have an adult with them who is not the patient and must remain in the main waiting area with an adult.  If the patient needs to stay at the hospital during part of their recovery, the visitor guidelines for inpatient rooms apply.  Please refer to the [facility] website for the visitor guidelines for any additional information.   If you received a COVID test during your pre-op visit  it is requested that you wear a mask when out in public, stay away from anyone that may not be feeling well and notify your surgeon if you develop symptoms. If you have been in contact with anyone that has tested positive in the last 10 days please notify you surgeon.      Pre-operative CHG Bathing Instructions   You can play a key role in reducing the risk of infection after surgery. Your skin needs to be as free of germs as possible. You can reduce the number of germs on your skin by washing with CHG (chlorhexidine gluconate) soap before surgery. CHG is an antiseptic soap that kills germs and continues to kill germs even after washing.   DO NOT use if you have an allergy to chlorhexidine/CHG or antibacterial soaps. If your skin becomes reddened or irritated, stop using the CHG and notify one of our RNs at [phone].              TAKE  A SHOWER THE NIGHT BEFORE SURGERY AND THE DAY OF SURGERY    Please keep in mind the following:  DO NOT shave, including legs and underarms, 48 hours prior to surgery.   You may shave your face before/day of surgery.  Place clean sheets on your bed the night before surgery Use a clean washcloth (not used since being washed) for each shower. DO NOT sleep with pet's night before surgery.  CHG Shower Instructions:  Wash your face and private area with normal soap. If you choose to wash your hair, wash first with your normal shampoo.  After you use shampoo/soap, rinse your hair and  body thoroughly to remove shampoo/soap residue.  Turn the water OFF and apply half the bottle of CHG soap to a CLEAN washcloth.  Apply CHG soap ONLY FROM YOUR NECK DOWN TO YOUR TOES (washing for 3-5 minutes)  DO NOT use CHG soap on face, private areas, open wounds, or sores.  Pay special attention to the area where your surgery is being performed.  If you are having back surgery, having someone wash your back for you may be helpful. Wait 2 minutes after CHG soap is applied, then you may rinse off the CHG soap.  Pat dry with a clean towel  Put on clean pajamas    Additional instructions for the day of surgery: DO NOT APPLY any lotions, deodorants, cologne, or perfumes.   Do not wear jewelry or makeup Do not wear nail polish, gel polish, artificial nails, or any other type of covering on natural nails (fingers and toes) Do not bring valuables to the hospital. [facility] is not responsible for valuables/personal belongings. Put on clean/comfortable clothes.  Please brush your teeth.  Ask your nurse before applying any prescription medications to the skin.

## 2023-06-01 ENCOUNTER — Encounter: Payer: Self-pay | Admitting: Thoracic Surgery (Cardiothoracic Vascular Surgery)

## 2023-06-01 ENCOUNTER — Encounter (HOSPITAL_COMMUNITY): Payer: Self-pay

## 2023-06-01 ENCOUNTER — Other Ambulatory Visit: Payer: Self-pay

## 2023-06-01 ENCOUNTER — Ambulatory Visit (HOSPITAL_COMMUNITY)
Admission: RE | Admit: 2023-06-01 | Discharge: 2023-06-01 | Disposition: A | Discharge status: Home / Self Care | Payer: Medicare Other | Source: Ambulatory Visit | Attending: Family

## 2023-06-01 VITALS — BP 131/72 | HR 65 | Temp 98.4°F | Resp 18 | Ht 72.0 in | Wt 276.7 lb

## 2023-06-01 DIAGNOSIS — I10 Essential (primary) hypertension: Secondary | ICD-10-CM | POA: Diagnosis not present

## 2023-06-01 DIAGNOSIS — Z01812 Encounter for preprocedural laboratory examination: Secondary | ICD-10-CM | POA: Diagnosis present

## 2023-06-01 DIAGNOSIS — Z01818 Encounter for other preprocedural examination: Secondary | ICD-10-CM

## 2023-06-01 HISTORY — DX: Nausea with vomiting, unspecified: R11.2

## 2023-06-01 HISTORY — DX: Sleep apnea, unspecified: G47.30

## 2023-06-01 HISTORY — DX: Personal history of urinary calculi: Z87.442

## 2023-06-01 HISTORY — DX: Nausea with vomiting, unspecified: Z98.890

## 2023-06-01 HISTORY — DX: Prediabetes: R73.03

## 2023-06-01 LAB — CBC
HCT: 44.9 % (ref 39.0–52.0)
Hemoglobin: 13.9 g/dL (ref 13.0–17.0)
MCH: 27.8 pg (ref 26.0–34.0)
MCHC: 31 g/dL (ref 30.0–36.0)
MCV: 89.8 fL (ref 80.0–100.0)
Platelets: 157 10*3/uL (ref 150–400)
RBC: 5 MIL/uL (ref 4.22–5.81)
RDW: 14.4 % (ref 11.5–15.5)
WBC: 5 10*3/uL (ref 4.0–10.5)
nRBC: 0 % (ref 0.0–0.2)

## 2023-06-01 LAB — BASIC METABOLIC PANEL
Anion gap: 7 (ref 5–15)
BUN: 13 mg/dL (ref 6–20)
CO2: 31 mmol/L (ref 22–32)
Calcium: 9.2 mg/dL (ref 8.9–10.3)
Chloride: 101 mmol/L (ref 98–111)
Creatinine, Ser: 0.91 mg/dL (ref 0.61–1.24)
GFR, Estimated: >60 mL/min (ref >60)
Glucose, Bld: 107 mg/dL — ABNORMAL HIGH (ref 70–99)
Potassium: 3.9 mmol/L (ref 3.5–5.1)
Sodium: 139 mmol/L (ref 135–145)

## 2023-06-01 NOTE — Progress Notes (Signed)
PCP - Fredna Dow starkes-perry Cardiologist - christopher shuamann  Cardiothoracid: [place]  PPM/ICD - denies  Chest x-ray - n/a EKG - 05/26/23 Stress Test - 2020 ECHO - 2020 Cardiac Cath - denies  Sleep Study - +OSA CPAP - does not wear consistently  7 days prior to surgery STOP taking any Aspirin (unless otherwise instructed by your surgeon), Aleve, Naproxen, Ibuprofen, Motrin, Advil, Goody's, BC's, all herbal medications, fish oil, and all vitamins.   ERAS Protcol -yes PRE-SURGERY Ensure or G2- not ordered  COVID TEST- not needed   Anesthesia review: yes, cardiac history  Patient denies shortness of breath, fever, cough and chest pain at PAT appointment   All instructions explained to the patient, with a verbal understanding of the material. Patient agrees to go over the instructions while at home for a better understanding. Patient also instructed to self quarantine after being tested for COVID-19. The opportunity to ask questions was provided.

## 2023-06-02 NOTE — Anesthesia Preprocedure Evaluation (Addendum)
Anesthesia Evaluation  Patient identified by MRN, date of birth, ID band Patient awake    Reviewed: Allergy & Precautions, NPO status , Patient's Chart, lab work & pertinent test results, reviewed documented beta blocker date and time   History of Anesthesia Complications (+) PONV and history of anesthetic complications  Airway Mallampati: I  TM Distance: >3 FB     Dental  (+) Chipped,    Pulmonary sleep apnea and Continuous Positive Airway Pressure Ventilation , former smoker   Pulmonary exam normal breath sounds clear to auscultation       Cardiovascular hypertension, Pt. on medications and Pt. on home beta blockers  Rhythm:Regular Rate:Bradycardia  Echo 07/28/19 1. Left ventricular ejection fraction, by visual estimation, is 50 to  55%. The left ventricle has low normal function. There is mildly increased  left ventricular hypertrophy.   2. Mildly dilated left ventricular internal cavity size.   3. The left ventricle demonstrates regional wall motion abnormalities.  Septal hypokinesis   4. Global right ventricle has normal systolic function.The right  ventricular size is normal. No increase in right ventricular wall  thickness.   5. Left atrial size was normal.   6. Right atrial size was normal.   7. The mitral valve is normal in structure. No evidence of mitral valve  regurgitation.   8. The tricuspid valve is normal in structure. Tricuspid valve  regurgitation is not demonstrated.   9. The aortic valve is tricuspid. Aortic valve regurgitation is not  visualized. No evidence of aortic valve sclerosis or stenosis.  10. The inferior vena cava is normal in size with greater than 50%  respiratory variability, suggesting right atrial pressure of 3 mmHg.  11. Aneurysm of the ascending aorta, measuring 48 mm.    EKG 05/26/23 SB, otherwise Normal    Neuro/Psych  PSYCHIATRIC DISORDERS Anxiety Depression        GI/Hepatic Neg liver ROS,,,Hx/o SBO    Endo/Other  Obesity Hyperlipidemia Pre Diabetes  Renal/GU Renal diseaseHx/o renal calculi  negative genitourinary   Musculoskeletal  (+) Arthritis , Osteoarthritis,    Abdominal  (+) + obese  Peds  Hematology Hx/o Thrombocytopenia   Anesthesia Other Findings   Reproductive/Obstetrics ED                              Anesthesia Physical Anesthesia Plan  ASA: 3  Anesthesia Plan: General   Post-op Pain Management: Minimal or no pain anticipated, Precedex, Tylenol PO (pre-op)* and Dilaudid IV   Induction: Intravenous  PONV Risk Score and Plan: 4 or greater and Treatment may vary due to age or medical condition, Midazolam, Ondansetron and Dexamethasone  Airway Management Planned: Oral ETT  Additional Equipment: None  Intra-op Plan:   Post-operative Plan: Extubation in OR  Informed Consent: I have reviewed the patients History and Physical, chart, labs and discussed the procedure including the risks, benefits and alternatives for the proposed anesthesia with the patient or authorized representative who has indicated his/her understanding and acceptance.     Dental advisory given  Plan Discussed with: CRNA and Anesthesiologist  Anesthesia Plan Comments: (PAT note by Antionette Poles, PA-C:  52 year old male follows with cardiology for history of nonobstructive CAD, ascending aortic aneurysm (stable 4.7 cm by CTA chest 04/2022), frequent PVCs, HTN, HLD, OSA on CPAP.  Last seen by Reather Littler, NP on 05/26/2023 for preop evaluation.  Per note, "Patient is scheduled to undergo Laparoscopic assisted ventral hernia repair with  mesh on 06/08/23. Mr. Staubs's perioperative risk of a major cardiac event is 0.4% according to the Revised Cardiac Risk Index (RCRI).  Therefore, he is at low risk for perioperative complications.   His functional capacity is good at 7.99 METs according to the Duke Activity Status Index (DASI).  Recommendations: According to ACC/AHA guidelines, no further cardiovascular testing needed.  The patient may proceed to surgery at acceptable risk."  History of C2-C7 cervical fusion.  Elective GlideScope used for intubation 05/24/2021.  Preop labs reviewed, WNL.  EKG 05/26/2023: Sinus bradycardia.  Rate 57.  No significant change.  Cardiac MRI 02/25/2021: IMPRESSION: 1. Low normal LV size and function EF 53% no RWMA;s   2. No gadolinium uptake on delayed inversion recovery sequences no evidence of Sarcoid   3. Moderate to severely dilated aortic root 4.7 cm Normal arch vessels and no coarctation   4.  Tri leaflet AV with mild appearing AR   5.  Normal RV size and function RVEF 49%   6.  Normal parametric measures see values above  Coronary CTA 12/08/2019: IMPRESSION: 1. Coronary calcium score of 18.9. This was 84th percentile for age and sex matched control.   2. Normal coronary origin with right dominance.   3. Moderately dilated ascending aorta measuring 47mm in diameter at the level of the Right Pulmonary artery.   4.  Minimal non obstructive coronary calcification in the mid LAD   5. CAD-RADS 1. Minimal non-obstructive CAD (0-24%). Consider non-atherosclerotic causes of chest pain. Consider preventive therapy and risk factor modification.  )         Anesthesia Quick Evaluation

## 2023-06-02 NOTE — Progress Notes (Signed)
Anesthesia Chart Review:  52 year old male follows with cardiology for history of nonobstructive CAD, ascending aortic aneurysm (stable 4.7 cm by CTA chest 04/2022), frequent PVCs, HTN, HLD, OSA on CPAP.  Last seen by Reather Littler, NP on 05/26/2023 for preop evaluation.  Per note, "Patient is scheduled to undergo Laparoscopic assisted ventral hernia repair with mesh on 06/08/23. Mr. Blatter's perioperative risk of a major cardiac event is 0.4% according to the Revised Cardiac Risk Index (RCRI).  Therefore, he is at low risk for perioperative complications.   His functional capacity is good at 7.99 METs according to the Duke Activity Status Index (DASI). Recommendations: According to ACC/AHA guidelines, no further cardiovascular testing needed.  The patient may proceed to surgery at acceptable risk."  History of C2-C7 cervical fusion.  Elective GlideScope used for intubation 05/24/2021.  Preop labs reviewed, WNL.  EKG 05/26/2023: Sinus bradycardia.  Rate 57.  No significant change.  Cardiac MRI 02/25/2021: IMPRESSION: 1. Low normal LV size and function EF 53% no RWMA;s   2. No gadolinium uptake on delayed inversion recovery sequences no evidence of Sarcoid   3. Moderate to severely dilated aortic root 4.7 cm Normal arch vessels and no coarctation   4.  Tri leaflet AV with mild appearing AR   5.  Normal RV size and function RVEF 49%   6.  Normal parametric measures see values above  Coronary CTA 12/08/2019: IMPRESSION: 1. Coronary calcium score of 18.9. This was 84th percentile for age and sex matched control.   2. Normal coronary origin with right dominance.   3. Moderately dilated ascending aorta measuring 47mm in diameter at the level of the Right Pulmonary artery.   4.  Minimal non obstructive coronary calcification in the mid LAD   5. CAD-RADS 1. Minimal non-obstructive CAD (0-24%). Consider non-atherosclerotic causes of chest pain. Consider preventive therapy and risk factor  modification.    Willard, [facility] Short Stay Center/Anesthesiology Phone [phone] 06/02/2023 4:18 PM

## 2023-06-08 ENCOUNTER — Ambulatory Visit (HOSPITAL_BASED_OUTPATIENT_CLINIC_OR_DEPARTMENT_OTHER): Payer: Medicare Other | Admitting: Certified Registered Nurse Anesthetist

## 2023-06-08 ENCOUNTER — Encounter (HOSPITAL_COMMUNITY)
Admission: RE | Disposition: A | Discharge status: Home / Self Care | Payer: Self-pay | Source: Home / Self Care | Attending: General Surgery

## 2023-06-08 ENCOUNTER — Encounter (HOSPITAL_COMMUNITY): Payer: Self-pay | Admitting: General Surgery

## 2023-06-08 ENCOUNTER — Ambulatory Visit (HOSPITAL_COMMUNITY): Payer: Medicare Other | Admitting: Physician Assistant

## 2023-06-08 ENCOUNTER — Ambulatory Visit (HOSPITAL_COMMUNITY)
Admission: RE | Admit: 2023-06-08 | Discharge: 2023-06-09 | Disposition: A | Discharge status: Home / Self Care | Payer: Medicare Other | Attending: General Surgery | Admitting: General Surgery

## 2023-06-08 DIAGNOSIS — I7121 Aneurysm of the ascending aorta, without rupture: Secondary | ICD-10-CM | POA: Diagnosis not present

## 2023-06-08 DIAGNOSIS — F32A Depression, unspecified: Secondary | ICD-10-CM | POA: Diagnosis not present

## 2023-06-08 DIAGNOSIS — K439 Ventral hernia without obstruction or gangrene: Secondary | ICD-10-CM

## 2023-06-08 DIAGNOSIS — F419 Anxiety disorder, unspecified: Secondary | ICD-10-CM | POA: Insufficient documentation

## 2023-06-08 DIAGNOSIS — I517 Cardiomegaly: Secondary | ICD-10-CM | POA: Insufficient documentation

## 2023-06-08 DIAGNOSIS — Z833 Family history of diabetes mellitus: Secondary | ICD-10-CM | POA: Insufficient documentation

## 2023-06-08 DIAGNOSIS — G4733 Obstructive sleep apnea (adult) (pediatric): Secondary | ICD-10-CM

## 2023-06-08 DIAGNOSIS — Z6837 Body mass index (BMI) 37.0-37.9, adult: Secondary | ICD-10-CM | POA: Insufficient documentation

## 2023-06-08 DIAGNOSIS — Z79899 Other long term (current) drug therapy: Secondary | ICD-10-CM | POA: Insufficient documentation

## 2023-06-08 DIAGNOSIS — G4730 Sleep apnea, unspecified: Secondary | ICD-10-CM | POA: Diagnosis not present

## 2023-06-08 DIAGNOSIS — Z87891 Personal history of nicotine dependence: Secondary | ICD-10-CM | POA: Diagnosis not present

## 2023-06-08 DIAGNOSIS — I10 Essential (primary) hypertension: Secondary | ICD-10-CM | POA: Insufficient documentation

## 2023-06-08 DIAGNOSIS — M1990 Unspecified osteoarthritis, unspecified site: Secondary | ICD-10-CM | POA: Insufficient documentation

## 2023-06-08 DIAGNOSIS — R001 Bradycardia, unspecified: Secondary | ICD-10-CM | POA: Insufficient documentation

## 2023-06-08 DIAGNOSIS — E785 Hyperlipidemia, unspecified: Secondary | ICD-10-CM | POA: Insufficient documentation

## 2023-06-08 DIAGNOSIS — R7303 Prediabetes: Secondary | ICD-10-CM | POA: Diagnosis not present

## 2023-06-08 DIAGNOSIS — E669 Obesity, unspecified: Secondary | ICD-10-CM | POA: Insufficient documentation

## 2023-06-08 HISTORY — PX: VENTRAL HERNIA REPAIR: SHX424

## 2023-06-08 HISTORY — PX: INSERTION OF MESH: SHX5868

## 2023-06-08 SURGERY — REPAIR, HERNIA, VENTRAL, LAPAROSCOPIC
Anesthesia: General | Site: Abdomen

## 2023-06-08 MED ORDER — ALBUMIN HUMAN 5 % IV SOLN: INTRAVENOUS | Status: DC | PRN

## 2023-06-08 MED ORDER — FENTANYL CITRATE (PF) 250 MCG/5ML IJ SOLN
INTRAMUSCULAR | Status: DC | PRN
  Administered 2023-06-08: 100 ug via INTRAVENOUS

## 2023-06-08 MED ORDER — FENTANYL CITRATE (PF) 250 MCG/5ML IJ SOLN
INTRAMUSCULAR | Status: AC
  Filled 2023-06-08: qty 5

## 2023-06-08 MED ORDER — CARVEDILOL 25 MG PO TABS
25.0000 mg | ORAL_TABLET | Freq: Two times a day (BID) | ORAL | Status: DC
  Administered 2023-06-08 – 2023-06-09 (×2): 25 mg via ORAL
  Filled 2023-06-08 (×3): qty 1

## 2023-06-08 MED ORDER — ROCURONIUM BROMIDE 10 MG/ML (PF) SYRINGE
PREFILLED_SYRINGE | INTRAVENOUS | Status: DC | PRN
  Administered 2023-06-08: 100 mg via INTRAVENOUS

## 2023-06-08 MED ORDER — SUGAMMADEX SODIUM 200 MG/2ML IV SOLN
INTRAVENOUS | Status: DC | PRN
  Administered 2023-06-08: 300 mg via INTRAVENOUS

## 2023-06-08 MED ORDER — AMISULPRIDE (ANTIEMETIC) 5 MG/2ML IV SOLN: 10.0000 mg | Freq: Once | INTRAVENOUS | Status: DC | PRN

## 2023-06-08 MED ORDER — ONDANSETRON HCL 4 MG/2ML IJ SOLN
INTRAMUSCULAR | Status: DC | PRN
  Administered 2023-06-08: 4 mg via INTRAVENOUS

## 2023-06-08 MED ORDER — PROPOFOL 10 MG/ML IV BOLUS
INTRAVENOUS | Status: DC | PRN
  Administered 2023-06-08: 200 mg via INTRAVENOUS

## 2023-06-08 MED ORDER — ONDANSETRON HCL 4 MG/2ML IJ SOLN
4.0000 mg | Freq: Four times a day (QID) | INTRAMUSCULAR | Status: DC | PRN
Start: 2023-06-08 — End: 2023-06-09

## 2023-06-08 MED ORDER — ONDANSETRON 4 MG PO TBDP: 4.0000 mg | ORAL_TABLET | Freq: Four times a day (QID) | ORAL | Status: DC | PRN

## 2023-06-08 MED ORDER — ONDANSETRON HCL 4 MG/2ML IJ SOLN: 4.0000 mg | Freq: Once | INTRAMUSCULAR | Status: DC | PRN

## 2023-06-08 MED ORDER — CEFAZOLIN IN SODIUM CHLORIDE 3-0.9 GM/100ML-% IV SOLN
3.0000 g | INTRAVENOUS | Status: AC
  Administered 2023-06-08: 3 g via INTRAVENOUS
  Filled 2023-06-08: qty 100

## 2023-06-08 MED ORDER — CYCLOBENZAPRINE HCL 10 MG PO TABS
10.0000 mg | ORAL_TABLET | Freq: Every evening | ORAL | Status: DC | PRN
  Filled 2023-06-08: qty 1

## 2023-06-08 MED ORDER — GABAPENTIN 300 MG PO CAPS
300.0000 mg | ORAL_CAPSULE | ORAL | Status: AC
  Administered 2023-06-08: 300 mg via ORAL
  Filled 2023-06-08: qty 1

## 2023-06-08 MED ORDER — EPHEDRINE SULFATE-NACL 50-0.9 MG/10ML-% IV SOSY
PREFILLED_SYRINGE | INTRAVENOUS | Status: DC | PRN
  Administered 2023-06-08: 5 mg via INTRAVENOUS

## 2023-06-08 MED ORDER — MIDAZOLAM HCL 2 MG/2ML IJ SOLN
INTRAMUSCULAR | Status: AC
  Filled 2023-06-08: qty 2

## 2023-06-08 MED ORDER — BUPIVACAINE-EPINEPHRINE 0.25% -1:200000 IJ SOLN
INTRAMUSCULAR | Status: DC | PRN
  Administered 2023-06-08: 17 mL

## 2023-06-08 MED ORDER — CHLORHEXIDINE GLUCONATE 0.12 % MT SOLN
15.0000 mL | Freq: Once | OROMUCOSAL | Status: AC
  Administered 2023-06-08: 15 mL via OROMUCOSAL
  Filled 2023-06-08: qty 15

## 2023-06-08 MED ORDER — PANTOPRAZOLE SODIUM 40 MG IV SOLR
40.0000 mg | Freq: Every day | INTRAVENOUS | Status: DC
  Administered 2023-06-08: 40 mg via INTRAVENOUS
  Filled 2023-06-08: qty 10

## 2023-06-08 MED ORDER — DEXAMETHASONE SODIUM PHOSPHATE 10 MG/ML IJ SOLN
INTRAMUSCULAR | Status: DC | PRN
  Administered 2023-06-08: 10 mg via INTRAVENOUS

## 2023-06-08 MED ORDER — MIDAZOLAM HCL 2 MG/2ML IJ SOLN
INTRAMUSCULAR | Status: DC | PRN
  Administered 2023-06-08: 2 mg via INTRAVENOUS

## 2023-06-08 MED ORDER — PROPOFOL 10 MG/ML IV BOLUS
INTRAVENOUS | Status: AC
  Filled 2023-06-08: qty 20

## 2023-06-08 MED ORDER — OXYCODONE HCL 5 MG PO TABS: 5.0000 mg | ORAL_TABLET | Freq: Once | ORAL | Status: DC | PRN

## 2023-06-08 MED ORDER — LIDOCAINE 2% (20 MG/ML) 5 ML SYRINGE
INTRAMUSCULAR | Status: DC | PRN
  Administered 2023-06-08: 60 mg via INTRAVENOUS

## 2023-06-08 MED ORDER — ACETAMINOPHEN 500 MG PO TABS
1000.0000 mg | ORAL_TABLET | Freq: Four times a day (QID) | ORAL | Status: DC | PRN
  Filled 2023-06-08: qty 2

## 2023-06-08 MED ORDER — ROSUVASTATIN CALCIUM 5 MG PO TABS
10.0000 mg | ORAL_TABLET | Freq: Every day | ORAL | Status: DC
  Administered 2023-06-08: 10 mg via ORAL
  Filled 2023-06-08: qty 2

## 2023-06-08 MED ORDER — BUPIVACAINE-EPINEPHRINE (PF) 0.25% -1:200000 IJ SOLN
INTRAMUSCULAR | Status: AC
  Filled 2023-06-08: qty 30

## 2023-06-08 MED ORDER — CHLORHEXIDINE GLUCONATE CLOTH 2 % EX PADS: 6.0000 | MEDICATED_PAD | Freq: Once | CUTANEOUS | Status: DC

## 2023-06-08 MED ORDER — OXYCODONE HCL 5 MG/5ML PO SOLN: 5.0000 mg | Freq: Once | ORAL | Status: DC | PRN

## 2023-06-08 MED ORDER — ENOXAPARIN SODIUM 30 MG/0.3ML IJ SOSY
30.0000 mg | PREFILLED_SYRINGE | INTRAMUSCULAR | Status: DC
  Administered 2023-06-09: 30 mg via SUBCUTANEOUS
  Filled 2023-06-08: qty 0.3

## 2023-06-08 MED ORDER — HYDROMORPHONE HCL 1 MG/ML IJ SOLN
0.2500 mg | INTRAMUSCULAR | Status: DC | PRN
  Administered 2023-06-08: 0.5 mg via INTRAVENOUS

## 2023-06-08 MED ORDER — CELECOXIB 200 MG PO CAPS
200.0000 mg | ORAL_CAPSULE | ORAL | Status: AC
  Administered 2023-06-08: 200 mg via ORAL
  Filled 2023-06-08: qty 1

## 2023-06-08 MED ORDER — ACETAMINOPHEN 500 MG PO TABS
1000.0000 mg | ORAL_TABLET | ORAL | Status: AC
  Administered 2023-06-08: 1000 mg via ORAL
  Filled 2023-06-08: qty 2

## 2023-06-08 MED ORDER — ATORVASTATIN CALCIUM 40 MG PO TABS: 40.0000 mg | ORAL_TABLET | Freq: Every day | ORAL | Status: DC

## 2023-06-08 MED ORDER — HYDROMORPHONE HCL 1 MG/ML IJ SOLN
INTRAMUSCULAR | Status: AC
  Filled 2023-06-08: qty 1

## 2023-06-08 MED ORDER — OXYCODONE HCL 5 MG PO TABS
5.0000 mg | ORAL_TABLET | ORAL | Status: DC | PRN
Start: 2023-06-08 — End: 2023-06-09
  Administered 2023-06-08: 5 mg via ORAL
  Filled 2023-06-08: qty 1

## 2023-06-08 MED ORDER — 0.9 % SODIUM CHLORIDE (POUR BTL) OPTIME
TOPICAL | Status: DC | PRN
  Administered 2023-06-08: 1000 mL

## 2023-06-08 MED ORDER — LACTATED RINGERS IV SOLN: INTRAVENOUS | Status: DC

## 2023-06-08 MED ORDER — ORAL CARE MOUTH RINSE
15.0000 mL | Freq: Once | OROMUCOSAL | Status: AC
Start: 2023-06-08 — End: 2023-06-08

## 2023-06-08 MED ORDER — HYDROXYZINE HCL 25 MG PO TABS
50.0000 mg | ORAL_TABLET | Freq: Every evening | ORAL | Status: DC | PRN
  Administered 2023-06-08: 50 mg via ORAL
  Filled 2023-06-08: qty 2

## 2023-06-08 MED ORDER — MORPHINE SULFATE (PF) 2 MG/ML IV SOLN: 1.0000 mg | INTRAVENOUS | Status: DC | PRN

## 2023-06-08 MED ORDER — SCOPOLAMINE 1 MG/3DAYS TD PT72
1.0000 | MEDICATED_PATCH | TRANSDERMAL | Status: DC
  Administered 2023-06-08: 1.5 mg via TRANSDERMAL
  Filled 2023-06-08: qty 1

## 2023-06-08 MED ORDER — PHENYLEPHRINE 80 MCG/ML (10ML) SYRINGE FOR IV PUSH (FOR BLOOD PRESSURE SUPPORT)
PREFILLED_SYRINGE | INTRAVENOUS | Status: DC | PRN
  Administered 2023-06-08: 160 ug via INTRAVENOUS
  Administered 2023-06-08: 80 ug via INTRAVENOUS

## 2023-06-08 SURGICAL SUPPLY — 52 items
ADH SKN CLS APL DERMABOND .7 (GAUZE/BANDAGES/DRESSINGS) ×2
APL PRP STRL LF DISP 70% ISPRP (MISCELLANEOUS) ×2
BAG COUNTER SPONGE SURGICOUNT (BAG) ×2 IMPLANT
BAG SPNG CNTER NS LX DISP (BAG) ×2
BINDER ABDOMINAL 12 ML 46-62 (SOFTGOODS) IMPLANT
BNDG GAUZE DERMACEA FLUFF 4 (GAUZE/BANDAGES/DRESSINGS) IMPLANT
BNDG GZE DERMACEA 4 6PLY (GAUZE/BANDAGES/DRESSINGS)
CANISTER SUCT 3000ML PPV (MISCELLANEOUS) IMPLANT
CHLORAPREP W/TINT 26 (MISCELLANEOUS) ×2 IMPLANT
COVER SURGICAL LIGHT HANDLE (MISCELLANEOUS) ×2 IMPLANT
DERMABOND ADVANCED .7 DNX12 (GAUZE/BANDAGES/DRESSINGS) ×2 IMPLANT
DEVICE SECURE STRAP 25 ABSORB (INSTRUMENTS) ×2 IMPLANT
DEVICE TROCAR PUNCTURE CLOSURE (ENDOMECHANICALS) ×2 IMPLANT
DRAPE INCISE IOBAN 66X45 STRL (DRAPES) ×2 IMPLANT
DRAPE LAPAROSCOPIC ABDOMINAL (DRAPES) ×2 IMPLANT
ELECT CAUTERY BLADE 6.4 (BLADE) ×2 IMPLANT
ELECT REM PT RETURN 9FT ADLT (ELECTROSURGICAL) ×2
ELECTRODE REM PT RTRN 9FT ADLT (ELECTROSURGICAL) ×2 IMPLANT
GAUZE PAD ABD 8X10 STRL (GAUZE/BANDAGES/DRESSINGS) IMPLANT
GLOVE BIO SURGEON STRL SZ7.5 (GLOVE) ×2 IMPLANT
GOWN STRL REUS W/ TWL LRG LVL3 (GOWN DISPOSABLE) ×6 IMPLANT
GOWN STRL REUS W/TWL LRG LVL3 (GOWN DISPOSABLE) ×6
IRRIG SUCT STRYKERFLOW 2 WTIP (MISCELLANEOUS)
IRRIGATION SUCT STRKRFLW 2 WTP (MISCELLANEOUS) IMPLANT
KIT BASIN OR (CUSTOM PROCEDURE TRAY) ×2 IMPLANT
KIT TURNOVER KIT B (KITS) ×2 IMPLANT
MARKER SKIN DUAL TIP RULER LAB (MISCELLANEOUS) ×2 IMPLANT
MESH OVITEX LPR PERM 15X15 4L (Mesh General) IMPLANT
NDL SPNL 22GX3.5 QUINCKE BK (NEEDLE) ×2 IMPLANT
NEEDLE SPNL 22GX3.5 QUINCKE BK (NEEDLE) ×2
NS IRRIG 1000ML POUR BTL (IV SOLUTION) ×2 IMPLANT
PAD ARMBOARD 7.5X6 YLW CONV (MISCELLANEOUS) ×4 IMPLANT
PENCIL BUTTON HOLSTER BLD 10FT (ELECTRODE) ×2 IMPLANT
SCISSORS LAP 5X35 DISP (ENDOMECHANICALS) IMPLANT
SET TUBE SMOKE EVAC HIGH FLOW (TUBING) ×2 IMPLANT
SHEARS HARMONIC ACE PLUS 36CM (ENDOMECHANICALS) IMPLANT
SLEEVE Z-THREAD 5X100MM (TROCAR) ×2 IMPLANT
SUT MNCRL AB 4-0 PS2 18 (SUTURE) ×2 IMPLANT
SUT NOVA NAB DX-16 0-1 5-0 T12 (SUTURE) ×2 IMPLANT
SUT VIC AB 3-0 SH 27 (SUTURE) ×2
SUT VIC AB 3-0 SH 27XBRD (SUTURE) ×2 IMPLANT
TOWEL GREEN STERILE (TOWEL DISPOSABLE) ×2 IMPLANT
TOWEL GREEN STERILE FF (TOWEL DISPOSABLE) ×2 IMPLANT
TRAY FOLEY W/BAG SLVR 16FR (SET/KITS/TRAYS/PACK) ×2
TRAY FOLEY W/BAG SLVR 16FR ST (SET/KITS/TRAYS/PACK) ×2 IMPLANT
TRAY LAPAROSCOPIC MC (CUSTOM PROCEDURE TRAY) ×2 IMPLANT
TROCAR 11X100 Z THREAD (TROCAR) IMPLANT
TROCAR BALLN 12MMX100 BLUNT (TROCAR) IMPLANT
TROCAR XCEL NON-BLD 5MMX100MML (ENDOMECHANICALS) IMPLANT
TROCAR Z-THREAD OPTICAL 5X100M (TROCAR) ×2 IMPLANT
WARMER LAPAROSCOPE (MISCELLANEOUS) ×2 IMPLANT
WATER STERILE IRR 1000ML POUR (IV SOLUTION) ×2 IMPLANT

## 2023-06-08 NOTE — Op Note (Signed)
06/08/2023  2:08 PM  PATIENT:  Ricky [name]  52 y.o. male  PRE-OPERATIVE DIAGNOSIS:  Ventral Hernia  POST-OPERATIVE DIAGNOSIS:  Ventral Hernia  PROCEDURE:  Procedure(s): LAPAROSCOPIC ASSISTED VENTRAL HERNIA REPAIR WITH MESH (N/A) INSERTION OF MESH (N/A)  SURGEON:  Surgeons and Role:    * Griselda Miner, MD - Primary  PHYSICIAN ASSISTANT:   ASSISTANTS: Rockwell Germany, RNFA   ANESTHESIA:   local and general  EBL:  10 mL   BLOOD ADMINISTERED:none  DRAINS: none   LOCAL MEDICATIONS USED:  MARCAINE     SPECIMEN:  No Specimen  DISPOSITION OF SPECIMEN:  N/A  COUNTS:  YES  TOURNIQUET:  * No tourniquets in log *  DICTATION: .Dragon Dictation  After informed consent was obtained the patient was brought to the operating room and placed in the supine position on the operating table.  After adequate induction of general anesthesia the patient's abdomen was prepped with ChloraPrep, allowed to dry, and draped in usual sterile manner including an Ioban drape.  An appropriate timeout was performed.  A site was chosen in the left upper quadrant for access in the abdominal cavity.  This area was infiltrated with quarter percent Marcaine.  A small stab incision was made with a 15 blade knife.  A 5 mm Optiview port and camera were used to bluntly dissected the layers of the abdominal wall under direct vision until access was gained to the abdominal cavity.  The abdomen was then insufflated with carbon dioxide without difficulty.  The camera was placed through the 5 mm port and the abdominal cavity was examined.  There was a small fascial defect just above the umbilicus that measured about 5 cm.  There were no adhesions to the anterior abdominal wall and the contents of the hernia had reduced spontaneously.  The falciform ligament was taken down sharply with the harmonic scalpel.  I chose a 15 x 15 cm piece of Ovitex LPR round permanent mesh.  Four #1 Novafil stitches were placed at equidistant  points around the edge of the mesh.  Next a small incision was made overlying the hernia with a 15 blade knife.  The incision was carried through the skin and subcutaneous tissue sharply with the electrocautery until the hernia sac was opened.  The hernia sac was removed sharply with the electrocautery.  The mesh was then placed through the fascial defect into the abdominal cavity with the appropriate orientation.  The fascial defect was then closed with multiple interrupted #1 Novafil stitches.  The abdomen was then insufflated with carbon dioxide again.  The mesh was confirmed in the appropriate orientation oriented with the blue side towards the abdominal wall.  4 small stab incisions were then made at points that corresponded to the 4 anchor stitches.  A suture passer was placed through each stab incision and used to bring the tails of each stitch through the abdominal wall.  Once this was accomplished the 4 stitches were pulled up to the abdominal wall and there was good apposition of the mesh to the abdominal wall without any redundancy.  Each of the stitches were cinched down and tied.  The gaps between the stitches were then filled in with 2 layers of secure strap tacks circumferentially.  Once this was accomplished again the mesh was observed to be in good apposition to the abdominal wall without any gaps or redundancy.  The area was examined and found to be hemostatic.  There were no other abnormalities noted on  general inspection of the abdominal cavity.  The gas was then allowed to escape and the mesh was observed to remain in good apposition to the abdominal wall.  The subcutaneous tissue of the main incision through the hernia was closed with interrupted 3-0 Vicryl stitches.  All of the incisions were then closed with 4-0 Monocryl subcuticular stitches.  Dermabond dressings were applied.  The patient tolerated the procedure well.  At the end of the case all needle sponge and instrument counts were  correct.  The patient was then awakened and taken to recovery in stable condition.  PLAN OF CARE: Admit for overnight observation  PATIENT DISPOSITION:  PACU - hemodynamically stable.   Delay start of Pharmacological VTE agent (>24hrs) due to surgical blood loss or risk of bleeding: no

## 2023-06-08 NOTE — Interval H&P Note (Signed)
History and Physical Interval Note:  06/08/2023 12:01 PM  Ricky [name]  has presented today for surgery, with the diagnosis of Ventral Hernia.  The various methods of treatment have been discussed with the patient and family. After consideration of risks, benefits and other options for treatment, the patient has consented to  Procedure(s): LAPAROSCOPIC ASSISTED VENTRAL HERNIA REPAIR WITH MESH (N/A) as a surgical intervention.  The patient's history has been reviewed, patient examined, no change in status, stable for surgery.  I have reviewed the patient's chart and labs.  Questions were answered to the patient's satisfaction.     Chevis Pretty III

## 2023-06-08 NOTE — H&P (Signed)
MRN: [id number] DOB: Jul 09, 1971 Subjective   Chief Complaint: Umbilical Hernia   History of Present Illness: Ricky [name] is a 52 y.o. male who is seen today for ventral hernia. The patient is a 52 year old black male who is about 2 years status post exploratory laparotomy and small bowel resection for small bowel obstruction. Shortly after his surgery he started noticing a small bulge just above the umbilicus. He recently had some discomfort associated with it. He states that it always goes in and out easily. He denies any nausea or vomiting. His appetite is good and his bowels are working normally. He had a recent CT scan that showed a small supraumbilical hernia involving some small bowel but no sign of obstruction. He also has a stable aortic aneurysm    Review of Systems: A complete review of systems was obtained from the patient. I have reviewed this information and discussed as appropriate with the patient. See HPI as well for other ROS.  ROS   Medical History: Past Medical History:  Diagnosis Date  Hypertension  Sleep apnea   Patient Active Problem List  Diagnosis  Small bowel obstruction (CMS/HHS-HCC)  Incisional hernia without obstruction or gangrene   Past Surgical History:  Procedure Laterality Date  LAPAROSCOPY DIAGNOSTIC (Abdomen) EXPLORATORY LAPAROTOMY (Abdomen) SMALL BOWEL RESECTION 05/24/2021  Dr. Carolynne Edouard    No Known Allergies  Current Outpatient Medications on File Prior to Visit  Medication Sig Dispense Refill  atorvastatin (LIPITOR) 40 MG tablet Take 40 mg by mouth once daily  carvediloL (COREG) 25 MG tablet Take 25 mg by mouth 2 (two) times daily with meals  cyclobenzaprine (FLEXERIL) 10 MG tablet Take 1 tablet by mouth 3 (three) times daily  EDARBYCLOR 40-25 mg Tab Take 1 tablet by mouth once daily  rosuvastatin (CRESTOR) 10 MG tablet Take 10 mg by mouth once daily  sildenafiL (VIAGRA) 100 MG tablet Take by mouth at bedtime as needed  hydrOXYzine  (VISTARIL) 50 MG capsule Take 50 mg by mouth once daily as needed   No current facility-administered medications on file prior to visit.   Family History  Problem Relation Age of Onset  Diabetes Mother    Social History   Tobacco Use  Smoking Status Former  Types: Cigarettes  Smokeless Tobacco Never    Social History   Socioeconomic History  Marital status: Single  Tobacco Use  Smoking status: Former  Types: Cigarettes  Smokeless tobacco: Never  Substance and Sexual Activity  Alcohol use: Never  Drug use: Never   Social Determinants of Health   Received from [employer]  Social Network   Objective:   Vitals:  Pulse: 97  Temp: 36.7 C (98.1 F)  SpO2: 96%  Weight: (!) 125.6 kg (276 lb 12.8 oz)  Height: 182.9 cm (6')  PainSc: 0-No pain   Body mass index is 37.54 kg/m.  Physical Exam Constitutional:  General: He is not in acute distress. Appearance: Normal appearance.  HENT:  Head: Normocephalic and atraumatic.  Right Ear: External ear normal.  Left Ear: External ear normal.  Nose: Nose normal.  Mouth/Throat:  Mouth: Mucous membranes are moist.  Pharynx: Oropharynx is clear.  Eyes:  General: No scleral icterus. Extraocular Movements: Extraocular movements intact.  Conjunctiva/sclera: Conjunctivae normal.  Pupils: Pupils are equal, round, and reactive to light.  Cardiovascular:  Rate and Rhythm: Normal rate and regular rhythm.  Pulses: Normal pulses.  Heart sounds: Normal heart sounds.  Pulmonary:  Effort: Pulmonary effort is normal. No respiratory distress.  Breath  sounds: Normal breath sounds.  Abdominal:  General: Abdomen is flat. Bowel sounds are normal. There is no distension.  Palpations: Abdomen is soft.  Tenderness: There is no abdominal tenderness.  Comments: There is a small reducible bulge associated with the incision just above the umbilicus. The hernia is soft with no sign of obstruction and reduces easily. The hernia itself  measures about 4 cm  Musculoskeletal:  General: No swelling or deformity. Normal range of motion.  Cervical back: Normal range of motion and neck supple. No tenderness.  Skin: General: Skin is warm and dry.  Coloration: Skin is not jaundiced.  Neurological:  General: No focal deficit present.  Mental Status: He is alert and oriented to person, place, and time.  Psychiatric:  Mood and Affect: Mood normal.  Behavior: Behavior normal.     Labs, Imaging and Diagnostic Testing:  Assessment and Plan:   Diagnoses and all orders for this visit:  Incisional hernia without obstruction or gangrene    The patient appears to have a reducible ventral incisional hernia. Because of the risk of incarceration and strangulation I feel he would benefit from having this fixed. He would also like to have this done. I have discussed with him in detail the risks and benefits of the operation to fix the hernia as well as some of the technical aspects including the use of mesh and the risk of chronic pain and he understands and wishes to proceed. I will plan for a laparoscopic assisted ventral hernia repair with mesh. We will move forward with surgical scheduling.

## 2023-06-08 NOTE — Anesthesia Postprocedure Evaluation (Signed)
Anesthesia Post Note  Patient: Ricky [name]  Procedure(s) Performed: LAPAROSCOPIC ASSISTED VENTRAL HERNIA REPAIR WITH MESH INSERTION OF MESH (Abdomen)     Patient location during evaluation: PACU Anesthesia Type: General Level of consciousness: awake and alert and oriented Pain management: pain level controlled Vital Signs Assessment: post-procedure vital signs reviewed and stable Respiratory status: spontaneous breathing, nonlabored ventilation and respiratory function stable Cardiovascular status: blood pressure returned to baseline and stable Postop Assessment: no apparent nausea or vomiting Anesthetic complications: no   No notable events documented.  Last Vitals:  Vitals:   06/08/23 1500 06/08/23 1515  BP: 118/82 115/80  Pulse: 70 65  Resp: 10 10  Temp:    SpO2: 92% 95%    Last Pain:  Vitals:   06/08/23 1515  TempSrc:   PainSc: Asleep                 Gitty Osterlund A.

## 2023-06-08 NOTE — Progress Notes (Signed)
Patient to 6N26 at this time from pacu

## 2023-06-08 NOTE — Anesthesia Procedure Notes (Signed)
Procedure Name: Intubation Date/Time: 06/08/2023 12:57 PM  Performed by: Alease Medina, CRNAPre-anesthesia Checklist: Patient identified, Emergency Drugs available, Suction available and Patient being monitored Patient Re-evaluated:Patient Re-evaluated prior to induction Oxygen Delivery Method: Circle system utilized Preoxygenation: Pre-oxygenation with 100% oxygen Induction Type: IV induction Ventilation: Mask ventilation without difficulty Laryngoscope Size: Glidescope and 4 Grade View: Grade I Tube type: Oral Tube size: 7.5 mm Number of attempts: 1 Airway Equipment and Method: Stylet and Oral airway Placement Confirmation: ETT inserted through vocal cords under direct vision, positive ETCO2 and breath sounds checked- equal and bilateral Secured at: 23 cm Tube secured with: Tape Dental Injury: Teeth and Oropharynx as per pre-operative assessment  Difficulty Due To: Difficult Airway- due to reduced neck mobility

## 2023-06-08 NOTE — Transfer of Care (Signed)
Immediate Anesthesia Transfer of Care Note  Patient: AHLIJAH KOBUS  Procedure(s) Performed: LAPAROSCOPIC ASSISTED VENTRAL HERNIA REPAIR WITH MESH INSERTION OF MESH (Abdomen)  Patient Location: PACU  Anesthesia Type:General  Level of Consciousness: drowsy and patient cooperative  Airway & Oxygen Therapy: Patient Spontanous Breathing  Post-op Assessment: Report given to RN, Post -op Vital signs reviewed and stable, and Patient moving all extremities X 4  Post vital signs: Reviewed and stable  Last Vitals:  Vitals Value Taken Time  BP 128/82 06/08/23 1419  Temp 36.4 C 06/08/23 1419  Pulse 67 06/08/23 1425  Resp 15 06/08/23 1425  SpO2 95 % 06/08/23 1425  Vitals shown include unfiled device data.  Last Pain:  Vitals:   06/08/23 1419  TempSrc:   PainSc: 0-No pain         Complications: No notable events documented.

## 2023-06-08 NOTE — Plan of Care (Signed)

## 2023-06-09 ENCOUNTER — Other Ambulatory Visit (HOSPITAL_COMMUNITY): Payer: Self-pay

## 2023-06-09 ENCOUNTER — Encounter (HOSPITAL_COMMUNITY): Payer: Self-pay | Admitting: General Surgery

## 2023-06-09 ENCOUNTER — Other Ambulatory Visit: Payer: Self-pay

## 2023-06-09 DIAGNOSIS — K439 Ventral hernia without obstruction or gangrene: Secondary | ICD-10-CM | POA: Diagnosis not present

## 2023-06-09 MED ORDER — OXYCODONE HCL 5 MG PO TABS
5.0000 mg | ORAL_TABLET | Freq: Four times a day (QID) | ORAL | 0 refills | Status: AC | PRN
  Filled 2023-06-09: qty 15, 4d supply, fill #0

## 2023-06-09 NOTE — Progress Notes (Signed)
1 Day Post-Op   Subjective/Chief Complaint: No complaints other than some soreness   Objective: Vital signs in last 24 hours: Temp:  [97.6 F (36.4 C)-98.7 F (37.1 C)] 98.7 F (37.1 C) (10/22 0522) Pulse Rate:  [61-76] 69 (10/22 0522) Resp:  [10-18] 18 (10/22 0522) BP: (110-128)/(80-92) 128/82 (10/22 0522) SpO2:  [92 %-99 %] 98 % (10/22 0522) Weight:  [125.2 kg] 125.2 kg (10/21 1144)    Intake/Output from previous day: 10/21 0701 - 10/22 0700 In: 1050 [I.V.:800; IV Piggyback:250] Out: 10 [Blood:10] Intake/Output this shift: No intake/output data recorded.  General appearance: alert and cooperative Resp: clear to auscultation bilaterally Cardio: regular rate and rhythm GI: soft, mild tenderness. Incisions look good  Lab Results:  No results for input(s): "WBC", "HGB", "HCT", "PLT" in the last 72 hours. BMET No results for input(s): "NA", "K", "CL", "CO2", "GLUCOSE", "BUN", "CREATININE", "CALCIUM" in the last 72 hours. PT/INR No results for input(s): "LABPROT", "INR" in the last 72 hours. ABG No results for input(s): "PHART", "HCO3" in the last 72 hours.  Invalid input(s): "PCO2", "PO2"  Studies/Results: No results found.  Anti-infectives: Anti-infectives (From admission, onward)    Start     Dose/Rate Route Frequency Ordered Stop   06/08/23 1145  ceFAZolin (ANCEF) IVPB 3g/100 mL premix        3 g 200 mL/hr over 30 Minutes Intravenous On call to O.R. 06/08/23 1133 06/08/23 1252       Assessment/Plan: s/p Procedure(s): LAPAROSCOPIC ASSISTED VENTRAL HERNIA REPAIR WITH MESH (N/A) INSERTION OF MESH (N/A) Advance diet Discharge  LOS: 0 days    Chevis Pretty III 06/09/2023

## 2023-06-09 NOTE — Discharge Summary (Signed)
Physician Discharge Summary  Patient ID: NORRIN ALCARAZ MRN: [id number] DOB/AGE: 1970/12/08 52 y.o.  Admit date: 06/08/2023 Discharge date: 06/09/2023  Admission Diagnoses:  Discharge Diagnoses:  Principal Problem:   Ventral hernia without obstruction or gangrene   Discharged Condition: good  Hospital Course: the pt underwent lap vhr with mesh. He tolerated surgery well. On pod 1 he was ready for d/c  Consults: None  Significant Diagnostic Studies: none  Treatments: surgery: as above  Discharge Exam: Blood pressure 128/82, pulse 69, temperature 98.7 F (37.1 C), temperature source Oral, resp. rate 18, height 6' (1.829 m), weight 125.2 kg, SpO2 98%. GI: soft, mild tenderness. Incisions look good  Disposition: Discharge disposition: 01-Home or Self Care       Discharge Instructions     Call MD for:  difficulty breathing, headache or visual disturbances   Complete by: As directed    Call MD for:  extreme fatigue   Complete by: As directed    Call MD for:  hives   Complete by: As directed    Call MD for:  persistant dizziness or light-headedness   Complete by: As directed    Call MD for:  persistant nausea and vomiting   Complete by: As directed    Call MD for:  redness, tenderness, or signs of infection (pain, swelling, redness, odor or green/yellow discharge around incision site)   Complete by: As directed    Call MD for:  severe uncontrolled pain   Complete by: As directed    Call MD for:  temperature >100.4   Complete by: As directed    Diet - low sodium heart healthy   Complete by: As directed    Discharge instructions   Complete by: As directed    May shower. Do not lift more than 10lbs for the next 6 weeks. Diet as tolerated. Use colace and miralax to avoid constipation. Wear binder most of the time for the first 2 weeks   Increase activity slowly   Complete by: As directed    No wound care   Complete by: As directed       Allergies as of  06/09/2023   No Known Allergies      Medication List     TAKE these medications    acetaminophen 500 MG tablet Commonly known as: TYLENOL Take 1 tablet (500 mg total) by mouth every 6 (six) hours as needed.   atorvastatin 40 MG tablet Commonly known as: LIPITOR Take 1 tablet (40 mg total) by mouth daily.   Blood Pressure Cuff Misc XL Adult blood pressure cuff   carvedilol 25 MG tablet Commonly known as: COREG TAKE 1 TABLET(25 MG) BY MOUTH TWICE DAILY WITH A MEAL   cyclobenzaprine 10 MG tablet Commonly known as: FLEXERIL Take 10 mg by mouth at bedtime as needed for muscle spasms.   Edarbyclor 40-25 MG Tabs Generic drug: Azilsartan-Chlorthalidone TAKE 1 TABLET DAILY (DOSE INCREASE)   hydrOXYzine 50 MG capsule Commonly known as: VISTARIL Take 50 mg by mouth at bedtime as needed (sleep).   ibuprofen 800 MG tablet Commonly known as: ADVIL Take 800 mg by mouth every 8 (eight) hours as needed for moderate pain.   ondansetron 4 MG disintegrating tablet Commonly known as: ZOFRAN-ODT Take 1 tablet (4 mg total) by mouth every 8 (eight) hours as needed for nausea or vomiting.   oxyCODONE 5 MG immediate release tablet Commonly known as: Roxicodone Take 1 tablet (5 mg total) by mouth every 6 (six) hours as  needed for severe pain (pain score 7-10).   PEG 3350 17 GM/SCOOP Powd Take 17 g by mouth daily. What changed:  when to take this reasons to take this   potassium chloride SA 20 MEQ tablet Commonly known as: Klor-Con M20 Take 1 tablet (20 mEq total) by mouth daily.   rosuvastatin 10 MG tablet Commonly known as: CRESTOR Take 10 mg by mouth at bedtime.   Senna Plus 8.6-50 MG tablet Generic drug: senna-docusate Take 1 tablet by mouth daily.   sildenafil 100 MG tablet Commonly known as: VIAGRA TAKE 1/2 TO 1 TABLET BY MOUTH AS NEEDED. Defer further refills to PCP.        Follow-up Information     Chevis Pretty III, MD Follow up in 2 week(s).   Specialty:  General Surgery Contact information: [address] [place] [address] [place] [address] [phone]                 Signed: Chevis Pretty III 06/09/2023, 8:01 AM

## 2023-06-09 NOTE — Plan of Care (Signed)
  Problem: Education: Goal: Knowledge of General Education information will improve Description: Including pain rating scale, medication(s)/side effects and non-pharmacologic comfort measures Outcome: Progressing   Problem: Health Behavior/Discharge Planning: Goal: Ability to manage health-related needs will improve Outcome: Progressing   Problem: Clinical Measurements: Goal: Ability to maintain clinical measurements within normal limits will improve Outcome: Progressing Goal: Will remain free from infection Outcome: Progressing   Problem: Activity: Goal: Risk for activity intolerance will decrease Outcome: Progressing   Problem: Nutrition: Goal: Adequate nutrition will be maintained Outcome: Progressing   Problem: Pain Managment: Goal: General experience of comfort will improve Outcome: Progressing   Problem: Safety: Goal: Ability to remain free from injury will improve Outcome: Progressing   

## 2023-06-15 ENCOUNTER — Other Ambulatory Visit: Payer: Self-pay

## 2023-06-15 DIAGNOSIS — E785 Hyperlipidemia, unspecified: Secondary | ICD-10-CM

## 2023-06-16 ENCOUNTER — Encounter: Payer: Self-pay | Admitting: Cardiology

## 2023-06-16 ENCOUNTER — Telehealth: Payer: Self-pay

## 2023-06-16 LAB — BASIC METABOLIC PANEL
BUN/Creatinine Ratio: 21 — ABNORMAL HIGH (ref 9–20)
BUN: 21 mg/dL (ref 6–24)
CO2: 28 mmol/L (ref 20–29)
Calcium: 9.1 mg/dL (ref 8.7–10.2)
Chloride: 102 mmol/L (ref 96–106)
Creatinine, Ser: 1.01 mg/dL (ref 0.76–1.27)
Glucose: 105 mg/dL — ABNORMAL HIGH (ref 70–99)
Potassium: 4.1 mmol/L (ref 3.5–5.2)
Sodium: 142 mmol/L (ref 134–144)
eGFR: 89 mL/min/{1.73_m2} (ref >59)

## 2023-06-16 NOTE — Telephone Encounter (Signed)
Called patient advised of below they verbalized understanding.

## 2023-06-16 NOTE — Telephone Encounter (Signed)
Please let Ricky [name] know that his BUN/Creatinine is very mildly elevated. I would recommend staying hydrated with water.

## 2023-06-16 NOTE — Telephone Encounter (Signed)
-----   Message from Rip Harbour sent at 06/16/2023  7:53 AM EDT ----- Please let Ricky [name] know his kidney function and electrolytes are normal. Good result! Follow up as planned.

## 2023-06-16 NOTE — Telephone Encounter (Signed)
Called patient advised of below they verbalized understanding. but they were curious if there was a way to lower the bun/creatine ratio which was 21? Please advise.

## 2023-06-17 ENCOUNTER — Other Ambulatory Visit: Payer: Medicare Other

## 2023-06-18 ENCOUNTER — Other Ambulatory Visit: Payer: Self-pay

## 2023-06-18 ENCOUNTER — Emergency Department (HOSPITAL_BASED_OUTPATIENT_CLINIC_OR_DEPARTMENT_OTHER)
Admission: EM | Admit: 2023-06-18 | Discharge: 2023-06-18 | Disposition: A | Discharge status: Home / Self Care | Payer: Medicare Other | Attending: Emergency Medicine | Admitting: Emergency Medicine

## 2023-06-18 ENCOUNTER — Emergency Department (HOSPITAL_BASED_OUTPATIENT_CLINIC_OR_DEPARTMENT_OTHER): Payer: Medicare Other

## 2023-06-18 ENCOUNTER — Encounter (HOSPITAL_BASED_OUTPATIENT_CLINIC_OR_DEPARTMENT_OTHER): Payer: Self-pay

## 2023-06-18 DIAGNOSIS — Z79899 Other long term (current) drug therapy: Secondary | ICD-10-CM | POA: Diagnosis not present

## 2023-06-18 DIAGNOSIS — E871 Hypo-osmolality and hyponatremia: Secondary | ICD-10-CM | POA: Diagnosis not present

## 2023-06-18 DIAGNOSIS — R0789 Other chest pain: Secondary | ICD-10-CM | POA: Diagnosis not present

## 2023-06-18 DIAGNOSIS — R42 Dizziness and giddiness: Secondary | ICD-10-CM | POA: Diagnosis present

## 2023-06-18 DIAGNOSIS — I10 Essential (primary) hypertension: Secondary | ICD-10-CM | POA: Diagnosis not present

## 2023-06-18 LAB — CBC
HCT: 41.9 % (ref 39.0–52.0)
Hemoglobin: 13.6 g/dL (ref 13.0–17.0)
MCH: 28.3 pg (ref 26.0–34.0)
MCHC: 32.5 g/dL (ref 30.0–36.0)
MCV: 87.1 fL (ref 80.0–100.0)
Platelets: 211 10*3/uL (ref 150–400)
RBC: 4.81 MIL/uL (ref 4.22–5.81)
RDW: 13.2 % (ref 11.5–15.5)
WBC: 6.7 10*3/uL (ref 4.0–10.5)
nRBC: 0 % (ref 0.0–0.2)

## 2023-06-18 LAB — BASIC METABOLIC PANEL
Anion gap: 7 (ref 5–15)
BUN: 14 mg/dL (ref 6–20)
CO2: 30 mmol/L (ref 22–32)
Calcium: 9.5 mg/dL (ref 8.9–10.3)
Chloride: 97 mmol/L — ABNORMAL LOW (ref 98–111)
Creatinine, Ser: 0.86 mg/dL (ref 0.61–1.24)
GFR, Estimated: >60 mL/min (ref >60)
Glucose, Bld: 108 mg/dL — ABNORMAL HIGH (ref 70–99)
Potassium: 3.8 mmol/L (ref 3.5–5.1)
Sodium: 134 mmol/L — ABNORMAL LOW (ref 135–145)

## 2023-06-18 LAB — TROPONIN I (HIGH SENSITIVITY): Troponin I (High Sensitivity): 5 ng/L (ref <18)

## 2023-06-18 MED ORDER — SODIUM CHLORIDE 0.9 % IV BOLUS
1000.0000 mL | Freq: Once | INTRAVENOUS | Status: AC
  Administered 2023-06-18: 1000 mL via INTRAVENOUS

## 2023-06-18 MED ORDER — LIDOCAINE VISCOUS HCL 2 % MT SOLN: 15.0000 mL | Freq: Once | OROMUCOSAL | Status: DC

## 2023-06-18 MED ORDER — ALUM & MAG HYDROXIDE-SIMETH 200-200-20 MG/5ML PO SUSP: 30.0000 mL | Freq: Once | ORAL | Status: DC

## 2023-06-18 MED ORDER — IOHEXOL 350 MG/ML SOLN
100.0000 mL | Freq: Once | INTRAVENOUS | Status: AC | PRN
  Administered 2023-06-18: 100 mL via INTRAVENOUS

## 2023-06-18 NOTE — ED Triage Notes (Signed)
Pt c/o dizzy spells- worse when standing, increased belching/ indigestion x2 days. Seen at PCP 2 days ago for same, advised he was dehydrated & "been trying to stay hydrated but I don't feel better."  Advises hx enlarged aorta, has CT scheduled next week Recent abd surgery for hernia, denies sick symptoms. Incision appears clean, well-approximated, no swelling/ drainage noted.

## 2023-06-18 NOTE — ED Provider Notes (Signed)
[place] [facility]GENC[facility] DEPARTMENT AT [facility] Provider Note   CSN: [id number] Arrival date & time: 06/18/23  1407     History  Chief Complaint  Patient presents with   Dizziness   Dehydration   Belching    Indigestion    Ricky [name] is a 52 y.o. male with past medical history significant for hyperlipidemia, obesity, hypertension, recent ventral hernia repair, ascending aortic aneurysm which has been stable, is imaged every 6 months who presents with concern for dizzy spells which are worse with standing, increased belching/indigestion for 2 days.  Patient seen at PCP for same, was advised that he was dehydrated due to elevated BUN/creatinine, has been drinking liquid IV but does not necessarily feel better.  He reports the dizziness is not constant.  He reports he has some chest tightness.  He wanted to make sure that there was not anything abnormal with his aortic aneurysm or heart, he reports that he has been having return of normal bowel movements after his surgery although he was constipated briefly.   Dizziness      Home Medications Prior to Admission medications   Medication Sig Start Date End Date Taking? Authorizing Provider  NOREL AD 4-10-325 MG TABS Take 1 tablet by mouth daily. 05/29/23  Yes [provider]  acetaminophen (TYLENOL) 500 MG tablet Take 1 tablet (500 mg total) by mouth every 6 (six) hours as needed. 02/10/23   Derwood Kaplan, MD  atorvastatin (LIPITOR) 40 MG tablet Take 1 tablet (40 mg total) by mouth daily. 12/01/22 11/26/23  Little Ishikawa, MD  Blood Pressure Monitoring (BLOOD PRESSURE CUFF) MISC XL Adult blood pressure cuff 08/18/19   Little Ishikawa, MD  carvedilol (COREG) 25 MG tablet TAKE 1 TABLET(25 MG) BY MOUTH TWICE DAILY WITH A MEAL 11/04/21   Little Ishikawa, MD  cyclobenzaprine (FLEXERIL) 10 MG tablet Take 10 mg by mouth at bedtime as needed for muscle spasms. 05/29/21   [provider]   EDARBYCLOR 40-25 MG TABS TAKE 1 TABLET DAILY (DOSE INCREASE) 12/23/22   Little Ishikawa, MD  hydrOXYzine (VISTARIL) 50 MG capsule Take 50 mg by mouth at bedtime as needed (sleep).    [provider]  ibuprofen (ADVIL) 800 MG tablet Take 800 mg by mouth every 8 (eight) hours as needed for moderate pain.    [provider]  ondansetron (ZOFRAN-ODT) 4 MG disintegrating tablet Take 1 tablet (4 mg total) by mouth every 8 (eight) hours as needed for nausea or vomiting. 11/06/21   Ernie Avena, MD  oxyCODONE (ROXICODONE) 5 MG immediate release tablet Take 1 tablet (5 mg total) by mouth every 6 (six) hours as needed for severe pain (pain score 7-10). 06/09/23   Chevis Pretty III, MD  polyethylene glycol powder (GLYCOLAX/MIRALAX) 17 GM/SCOOP powder Take 17 g by mouth daily. Patient taking differently: Take 17 g by mouth daily as needed for moderate constipation. 04/23/23   Dameron, Nolberto Hanlon, DO  potassium chloride SA (KLOR-CON M20) 20 MEQ tablet Take 1 tablet (20 mEq total) by mouth daily. 05/13/23   Little Ishikawa, MD  rosuvastatin (CRESTOR) 10 MG tablet Take 10 mg by mouth at bedtime. 02/01/21   [provider]  senna-docusate (SENOKOT-S) 8.6-50 MG tablet Take 1 tablet by mouth daily. 04/23/23   Dameron, Nolberto Hanlon, DO  sildenafil (VIAGRA) 100 MG tablet TAKE 1/2 TO 1 TABLET BY MOUTH AS NEEDED. Defer further refills to PCP. 04/04/21   Little Ishikawa, MD  Allergies    Patient has no known allergies.    Review of Systems   Review of Systems  Neurological:  Positive for dizziness.  All other systems reviewed and are negative.   Physical Exam Updated Vital Signs BP 118/81   Pulse 71   Temp 99.3 F (37.4 C) (Oral)   Resp 16   SpO2 96%  Physical Exam Vitals and nursing note reviewed.  Constitutional:      General: He is not in acute distress.    Appearance: Normal appearance.  HENT:     Head: Normocephalic and atraumatic.  Eyes:     General:         Right eye: No discharge.        Left eye: No discharge.  Cardiovascular:     Rate and Rhythm: Normal rate and regular rhythm.     Heart sounds: No murmur heard.    No friction rub. No gallop.  Pulmonary:     Effort: Pulmonary effort is normal.     Breath sounds: Normal breath sounds.  Abdominal:     General: Bowel sounds are normal.     Palpations: Abdomen is soft.  Skin:    General: Skin is warm and dry.     Capillary Refill: Capillary refill takes less than 2 seconds.  Neurological:     Mental Status: He is alert and oriented to person, place, and time.     Comments: Negative Romberg, normal gait.  Moves all 4 limbs spontaneously, normal speech.  CN II through XII grossly intact.  Psychiatric:        Mood and Affect: Mood normal.        Behavior: Behavior normal.     ED Results / Procedures / Treatments   Labs (all labs ordered are listed, but only abnormal results are displayed) Labs Reviewed  BASIC METABOLIC PANEL - Abnormal; Notable for the following components:      Result Value   Sodium 134 (*)    Chloride 97 (*)    Glucose, Bld 108 (*)    All other components within normal limits  CBC  TROPONIN I (HIGH SENSITIVITY)    EKG EKG Interpretation Date/Time:  Thursday June 18 2023 14:16:42 EDT Ventricular Rate:  72 PR Interval:  172 QRS Duration:  98 QT Interval:  376 QTC Calculation: 411 R Axis:   -15  Text Interpretation: Normal sinus rhythm Normal ECG When compared with ECG of 26-May-2023 11:11, No significant change was found Confirmed by Elayne Snare ([id number]) on 06/18/2023 3:33:45 PM  Radiology CT Angio Chest/Abd/Pel for Dissection W and/or Wo Contrast  Result Date: 06/18/2023 CLINICAL DATA:  Follow-up ascending aortic aneurysm. EXAM: CT ANGIOGRAPHY CHEST, ABDOMEN AND PELVIS TECHNIQUE: Non-contrast CT of the chest was initially obtained. Multidetector CT imaging through the chest, abdomen and pelvis was performed using the standard protocol during  bolus administration of intravenous contrast. Multiplanar reconstructed images and MIPs were obtained and reviewed to evaluate the vascular anatomy. RADIATION DOSE REDUCTION: This exam was performed according to the departmental dose-optimization program which includes automated exposure control, adjustment of the mA and/or kV according to patient size and/or use of iterative reconstruction technique. CONTRAST:  OMNIPAQUE IOHEXOL 350 MG/ML SOLN COMPARISON:  CT of the abdomen pelvis dated 04/23/2023 and chest CT dated 12/12/2022. FINDINGS: CTA CHEST FINDINGS Cardiovascular: There is no cardiomegaly or pericardial effusion. Mildly dilated ascending aorta measures up to 4.2 cm. No aortic dissection. There is minimal atherosclerotic calcification of the aortic arch. The  origins of the great vessels of the aortic arch appear patent. No pulmonary artery embolus identified. Mediastinum/Nodes: No hilar or mediastinal adenopathy. The esophagus and the thyroid gland are grossly unremarkable. No mediastinal fluid collection. Lungs/Pleura: Right lower lobe linear atelectasis/scarring. No focal consolidation, pleural effusion, or pneumothorax. The central airways are patent. Musculoskeletal: No acute osseous pathology. Review of the MIP images confirms the above findings. CTA ABDOMEN AND PELVIS FINDINGS VASCULAR Aorta: Normal caliber aorta without aneurysm, dissection, vasculitis or significant stenosis. Celiac: Patent without evidence of aneurysm, dissection, vasculitis or significant stenosis. SMA: Patent without evidence of aneurysm, dissection, vasculitis or significant stenosis. Renals: Both renal arteries are patent without evidence of aneurysm, dissection, vasculitis, fibromuscular dysplasia or significant stenosis. IMA: Patent without evidence of aneurysm, dissection, vasculitis or significant stenosis. Inflow: Patent without evidence of aneurysm, dissection, vasculitis or significant stenosis. Veins: No obvious  venous abnormality within the limitations of this arterial phase study. Review of the MIP images confirms the above findings. NON-VASCULAR No intra-abdominal free air or free fluid. Hepatobiliary: The liver is unremarkable. No biliary dilatation. The gallbladder is unremarkable. Pancreas: Unremarkable. No pancreatic ductal dilatation or surrounding inflammatory changes. Spleen: Normal in size without focal abnormality. Adrenals/Urinary Tract: The adrenal glands are unremarkable. There is no hydronephrosis on either side. The visualized ureters and urinary bladder appear unremarkable. Stomach/Bowel: Postsurgical changes of the bowel with anastomotic staple line in the mid abdomen. There is abutment of several loops of small bowel to the anterior peritoneal wall suggestive of adhesions. There is inflammatory changes of a loop of small bowel in the anterior abdomen adjacent to the peritoneum which may be related to hernia repair or represent enteritis. Clinical correlation recommended there is no bowel obstruction. The appendix is not visualized with certainty. No inflammatory changes identified in the right lower quadrant. Lymphatic: No adenopathy. Reproductive: The prostate and seminal vesicles are grossly unremarkable. No pelvic mass. Other: There is a 1.1 x 1.7 cm nodular density in the subcutaneous soft tissues of the anterior abdominal wall to the right of the surgical incision and above the umbilicus in the region of the previously seen hernia. This may represent a small fluid collection or postsurgical seroma related to hernia repair. No drainable fluid collection. Musculoskeletal: Degenerative changes of the spine and lower lumbar posterior fusion. No acute osseous pathology. Review of the MIP images confirms the above findings. IMPRESSION: 1. Mildly dilated ascending aorta measures up to 4.2 cm. Recommend annual imaging followup by CTA or MRA. This recommendation follows 2010  ACCF/AHA/AATS/ACR/ASA/SCA/SCAI/SIR/STS/SVM Guidelines for the Diagnosis and Management of Patients with Thoracic Aortic Disease. Circulation. 2010; 121: W098-J191. Aortic aneurysm NOS (ICD10-I71.9) No aortic dissection. 2. Postsurgical changes of the bowel with anastomotic staple line in the mid abdomen. No bowel obstruction. 3. Inflammatory changes of a loop of small bowel in the anterior abdomen adjacent to the peritoneum which may be related to hernia repair or represent enteritis. 4. Probable small postsurgical seroma in the anterior or abdominal wall to the right of the surgical incision. No drainable fluid collection. Electronically Signed   By: Elgie Collard M.D.   On: 06/18/2023 18:36   DG Chest Portable 1 View  Result Date: 06/18/2023 CLINICAL DATA:  chest tightness EXAM: PORTABLE CHEST 1 VIEW COMPARISON:  November 06, 2021 FINDINGS: The cardiomediastinal silhouette is unchanged and enlarged in contour. No pleural effusion. No pneumothorax. No acute pleuroparenchymal abnormality. Remote RIGHT-sided rib fractures. IMPRESSION: No acute cardiopulmonary abnormality. Electronically Signed   By: Meda Klinefelter M.D.   On:  06/18/2023 17:25    Procedures Procedures    Medications Ordered in ED Medications  sodium chloride 0.9 % bolus 1,000 mL (1,000 mLs Intravenous New Bag/Given 06/18/23 1619)  iohexol (OMNIPAQUE) 350 MG/ML injection 100 mL (100 mLs Intravenous Contrast Given 06/18/23 1637)    ED Course/ Medical Decision Making/ A&P                                 Medical Decision Making Amount and/or Complexity of Data Reviewed Labs: ordered. Radiology: ordered.   This patient is a 52 y.o. male  who presents to the ED for concern of dizziness, epigastric pain.   Differential diagnoses prior to evaluation: The emergent differential diagnosis includes, but is not limited to,  BPPV, vestibular migraine, head trauma, AVM, intracranial tumor, multiple sclerosis, drug-related, CVA,  vasovagal syncope, orthostatic hypotension, sepsis, hypoglycemia, electrolyte disturbance, anemia, anxiety/panic attack, esophagitis, gastritis, peptic ulcer disease, esophageal rupture, gastric rupture, Boerhaave's, Mallory-Weiss, pancreatitis, cholecystitis, cholangitis, acute mesenteric ischemia, atypical chest pain or ACS, lower lobar pneumonia versus other . This is not an exhaustive differential.   Past Medical History / Co-morbidities / Social History:  hyperlipidemia, obesity, hypertension, recent ventral hernia repair, ascending aortic aneurysm  Additional history: Chart reviewed. Pertinent results include: Reviewed recent outpatient general surgery, cardiology visits  Physical Exam: Physical exam performed. The pertinent findings include: On my exam patient with no focal neurologic deficits, negative Romberg, normal gait.  No focal neurologic deficits, vital signs stable in the emergency department.  Lab Tests/Imaging studies: I personally interpreted labs/imaging and the pertinent results include: BMP with very mild hyponatremia, sodium 134, initial troponin is 5, do not think that he needs a repeat troponin with no exertional chest pain, no chest pain at time of evaluation, he reports some general chest discomfort.  I independently interpreted plain film chest x-ray as well as CT angio chest abdomen pelvis which shows no evidence of worsening of his thoracic aneurysm.. I agree with the radiologist interpretation.  Cardiac monitoring: EKG obtained and interpreted by myself and attending physician which shows: Normal sinus rhythm, no significant change from baseline   Medications: I ordered medication including fluid bolus.  I have reviewed the patients home medicines and have made adjustments as needed.   Disposition: After consideration of the diagnostic results and the patients response to treatment, I feel that no clear cause for patient's dizziness, local medical suspicion for  atypical or posterior stroke based on his presentation, his symptoms are intermittent, not consistent, it is possible that he is mildly dehydrated secondary to his recent surgery, constipation, and laxative usage, we gave him some fluids in the emergency department and he is able to ambulate without difficulty with no significant dizziness while he is present in the emergency department.  I think that he is stable for discharge with plan for PCP follow-up as needed.Marland Kitchen   emergency department workup does not suggest an emergent condition requiring admission or immediate intervention beyond what has been performed at this time. The plan is: as above. The patient is safe for discharge and has been instructed to return immediately for worsening symptoms, change in symptoms or any other concerns.  Final Clinical Impression(s) / ED Diagnoses Final diagnoses:  Dizziness    Rx / DC Orders ED Discharge Orders     None         Olene Floss, PA-C 06/18/23 1848    Rexford Maus, DO 06/18/23  2314  

## 2023-06-18 NOTE — Discharge Instructions (Signed)
I did not see any emergent findings to explain your intermittent dizzy episodes today, if you continue to have the episodes a recommend that you follow-up with your primary care doctor for further evaluation, in the meantime you can continue drinking plenty fluids including electrolyte containing fluids such as Pedialyte, Gatorade

## 2023-06-19 ENCOUNTER — Telehealth: Payer: Medicare Other | Admitting: Thoracic Surgery (Cardiothoracic Vascular Surgery)

## 2023-06-21 NOTE — Progress Notes (Deleted)
Cardiology Office Note    Date:  06/21/2023  ID:  Ricky [name], DOB [birth year], MRN [id number] PCP:  Maryagnes Amos, FNP  Cardiologist:  Little Ishikawa, MD  Electrophysiologist:  None   Chief Complaint: Follow up for HTN   History of Present Illness: .   Ricky [name] is a 52 y.o. male with visit-pertinent history of frequent PVCs, thoracic aortic aneurysm, OSA, hypertension, thrombocytopenia.   Initially seen in 07/2019 for evaluation of PVCs.  TTE in 07/2019 showed low normal systolic function EF 50 to 55%, septal hypokinesis.  It also showed an ascending aortic aneurysm measuring up to 48 mm.  CTA chest in 07/2019 showed ascending aortic aneurysm measuring up to 47 mm.  Cardiac monitor showed frequent PVCs (23%) and 120 episodes of SVT, lasting up to 1 minute.  Lexiscan Myoview in 07/2019 showed no evidence of ischemia.  He was referred to Dr. Ladona Ridgel in EP for evaluation of PVCs, recommended uptitrating his beta-blocker as initial treatment and repeat monitor once on Coreg 25 mg twice daily.  Repeat monitor in 10/2019 showed significant improvement in PVC burden at 5.3%.  Coronary CTA in 11/2019 showed calcium score of 19 which was 84th percentile, minimal nonobstructive CAD and mid LAD.  Cardiac MRI in 02/2021 showed no LGE, EF 53%, dilated aortic root measuring 47 mm, normal RV function.   He was seen in clinic by Dr. Bjorn Pippin in 11/2022.  He had remained stable from a cardiac perspective.  He did report problems with muscle cramps in his arms and his legs, he had reduced his statin dose to every other day and symptoms resolved.   On 04/23/23 he presented to the emergency room for postprandial periumbilical abdominal tenderness. CT angio showed supraumbilical hernia without strangulation or obstruction. There was no evidence of peritonitis or incarcerated/strangulated hernia. He was discharged with recommendation to follow up with GI.   He was last seen in clinic on 05/26/23.  Reported that he had been doing well although he noted increased fatigue and anxiety, related to a family member passing. Reported aside from increased stress he had been doing very well. His blood pressure would occasionally run low in the morning, specifically when he used his CPAP, as such he stopped using his CPAP over two months ago. He noted his blood pressure maybe once a month would be in the 80's/40's, had improvement since stopping CPAP use.   On 06/08/2023 he underwent laparoscopic assisted ventral hernia repair with mesh.  He was discharged on 06/09/2023 in stable condition.  On 06/18/2023 he presented to the emergency room with dizzy spells on standing, increased belching and indigestion for 2 days.  Patient also noted generalized chest discomfort, not associated with exertion.  He had a negative troponin x 1.  Given history of aortic aneurysm they checked a CT angio chest/abdomen/pelvis for possible dissection.  It was noted that he had a mildly dilated ascending aorta measuring up to 4.2 cm, no aortic dissection, minimal atherosclerotic calcification of the aortic arch.  He was given a fluid bolus and discharged in stable condition.  Today he presents for follow up. He reports that he   CAD: History of atypical chest pain. Lexiscan Myoview in 07/2019 showed no evidence of ischemia. Coronary CTA in 11/2019 showed calcium score of 19 which was 84th percentile, minimal nonobstructive CAD and mid LAD.  Today he denies anginal symptoms. EKG shows sinus bradycardia at 57 bpm, no significant changes. Heart healthy diet and regular cardiovascular  exercise encouraged.  Congratulated on intentional weight loss. Continue azilsartan-chlorthalidone 40-25 mg daily, Coreg 25 mg twice daily and Crestor 10 mg daily.   Aortic aneurysm: 4.7 cm ascending aortic aneurysm on CTA chest in 07/2017.  He has been monitored by cardiothoracic surgery with yearly CTA chest.  Last CTA chest in 04/2022 showed stable  ascending aortic aneurysm measuring 4.7 cm.   Frequent PVCs: Cardiac monitor in 08/2019 showed PVC burden of 23%, symptomatic episodes on monitor correspond to episodes of bigeminy. Seen by EP, Dr. Ladona Ridgel recommended tirtrating up Coreg, repeat monitor in 10/2019 showed PVC burden of 5.3%. No evidence of sarcoidosis on cMRI.  Today he denies palpitations. Continue carvedilol 25 mg twice daily   Hypertension: Per Dr. Bjorn Pippin SBP goal is less than 120 in setting of aortic aneurysm. Today he notes occasional low blood pressures which he associated with CPAP, he discontinued use, encouraged to resume CPAP use. He will monitor his blood pressure at home, he will notify the office if he starts consistently having low blood pressures.  With current increased stress, decided through shared decision making to continue current doses of medication with close follow up. If blood pressures are consistently low will plan to reduce azilsartan-chlorthalidone to 40-12.5 mg daily. Of note Coreg has been previously decreased for hypotension, this resulted in increased PVCs. Continue azilsartan-chlorthalidone 40-25 mg daily, carvedilol 25 mg twice daily.  Hyperlipidemia: Last lipid profile from 03/23/2023, pulled from patient's PCP mychart shows total cholesterol 98, triglyceride 102, HDL 33, LDL 46. Continue rosuvastatin 10 mg daily.  OSA:  Patient reports that he discontinued CPAP use over a month ago in setting of hypotension. Encouraged to resume with plan to down titrate antihypertensive medications as noted above if needed.   Labwork independently reviewed: 04/23/2023: Sodium 140, potassium 3.7, creatinine 0.93, AST 20, ALT 15, hemoglobin 14.6, hematocrit 45.1  ROS: .   *** denies chest pain, shortness of breath, lower extremity edema, fatigue, palpitations, melena, hematuria, hemoptysis, diaphoresis, weakness, presyncope, syncope, orthopnea, and PND.  All other systems are reviewed and otherwise negative.  Studies  Reviewed: Marland Kitchen    EKG:  EKG is not ordered today.   CV Studies:  Cardiac Studies & Procedures     STRESS TESTS  MYOCARDIAL PERFUSION IMAGING 08/18/2019  Interpretation Summary  The study is normal.  This is a low risk study.  Normal pharmacologic nuclear stress test with no evidence for prior infarct or ischemia. LVEF not calculated. Frequent PVCs.   ECHOCARDIOGRAM  ECHOCARDIOGRAM COMPLETE 07/28/2019  Narrative ECHOCARDIOGRAM REPORT    Patient Name:   ALVY ALSOP Date of Exam: 07/28/2019 Medical Rec #:  [id number]       Height:       72.0 in Accession #:    [id number]      Weight:       282.8 lb Date of Birth:  June 03, 1971       BSA:          2.47 m Patient Age:    48 years        BP:           142/98 mmHg Patient Gender: M               HR:           55 bpm. Exam Location:  [address]  Procedure: 2D Echo, 3D Echo, Cardiac Doppler and Color Doppler  Indications:    I49.3 PVC's  History:  Patient has no prior history of Echocardiogram examinations. Arrythmias:PVC, Signs/Symptoms:Dizziness/Lightheadedness; Risk Factors:Hypertension and Former Smoker. Palpitations, COVID 19 in October.  Sonographer:    Farrel Conners RDCS Referring Phys: [id number] CHRISTOPHER L SCHUMANN  IMPRESSIONS   1. Left ventricular ejection fraction, by visual estimation, is 50 to 55%. The left ventricle has low normal function. There is mildly increased left ventricular hypertrophy. 2. Mildly dilated left ventricular internal cavity size. 3. The left ventricle demonstrates regional wall motion abnormalities. Septal hypokinesis 4. Global right ventricle has normal systolic function.The right ventricular size is normal. No increase in right ventricular wall thickness. 5. Left atrial size was normal. 6. Right atrial size was normal. 7. The mitral valve is normal in structure. No evidence of mitral valve regurgitation. 8. The tricuspid valve is normal in structure. Tricuspid valve  regurgitation is not demonstrated. 9. The aortic valve is tricuspid. Aortic valve regurgitation is not visualized. No evidence of aortic valve sclerosis or stenosis. 10. The inferior vena cava is normal in size with greater than 50% respiratory variability, suggesting right atrial pressure of 3 mmHg. 11. Aneurysm of the ascending aorta, measuring 48 mm.  FINDINGS Left Ventricle: Left ventricular ejection fraction, by visual estimation, is 50 to 55%. The left ventricle has low normal function. The left ventricle demonstrates regional wall motion abnormalities. The left ventricular internal cavity size was mildly dilated left ventricle. There is mildly increased left ventricular hypertrophy. Concentric left ventricular hypertrophy. Left ventricular diastolic parameters were normal.  Right Ventricle: The right ventricular size is normal. No increase in right ventricular wall thickness. Global RV systolic function is has normal systolic function.  Left Atrium: Left atrial size was normal in size.  Right Atrium: Right atrial size was normal in size  Pericardium: There is no evidence of pericardial effusion.  Mitral Valve: The mitral valve is normal in structure. No evidence of mitral valve regurgitation.  Tricuspid Valve: The tricuspid valve is normal in structure. Tricuspid valve regurgitation is not demonstrated.  Aortic Valve: The aortic valve is tricuspid. Aortic valve regurgitation is not visualized. The aortic valve is structurally normal, with no evidence of sclerosis or stenosis.  Pulmonic Valve: The pulmonic valve was not well visualized. Pulmonic valve regurgitation is trivial. Pulmonic regurgitation is trivial.  Aorta: Aortic dilatation noted. There is an aneurysm involving the ascending aorta. The aneurysm measures 48 mm.  Venous: The inferior vena cava is normal in size with greater than 50% respiratory variability, suggesting right atrial pressure of 3 mmHg.  IAS/Shunts: The  atrial septum is grossly normal.   LEFT VENTRICLE PLAX 2D LVIDd:         6.00 cm  Diastology LVIDs:         4.33 cm  LV e' lateral:   7.34 cm/s LV PW:         1.29 cm  LV E/e' lateral: 4.8 LV IVS:        1.19 cm  LV e' medial:    5.59 cm/s LVOT diam:     2.50 cm  LV E/e' medial:  6.3 LV SV:         96 ml LV SV Index:   36.71 LVOT Area:     4.91 cm   RIGHT VENTRICLE RV S prime:     10.40 cm/s TAPSE (M-mode): 2.4 cm  LEFT ATRIUM             Index       RIGHT ATRIUM  Index LA diam:        4.40 cm 1.78 cm/m  RA Area:     22.50 cm LA Vol (A2C):   77.6 ml 31.44 ml/m RA Volume:   67.40 ml  27.30 ml/m LA Vol (A4C):   62.7 ml 25.40 ml/m LA Biplane Vol: 74.2 ml 30.06 ml/m AORTIC VALVE LVOT Vmax:   74.60 cm/s LVOT Vmean:  48.200 cm/s LVOT VTI:    0.152 m  AORTA Ao Root diam: 4.00 cm Ao Asc diam:  4.80 cm  MITRAL VALVE MV Area (PHT): cm                  SHUNTS MV PHT:        msec                 Systemic VTI:  0.15 m MV Decel Time: 309 msec             Systemic Diam: 2.50 cm MV E velocity: 35.40 cm/s 103 cm/s MV A velocity: 62.05 cm/s 70.3 cm/s MV E/A ratio:  0.57       1.5   Epifanio Lesches MD Electronically signed by Epifanio Lesches MD Signature Date/Time: 07/28/2019/1:36:55 PM    Final    MONITORS  LONG TERM MONITOR (3-14 DAYS) 11/11/2019  Narrative  Frequent PVCs (5.3% of beats)  3 episodes of SVT, longest lasting 1 minute 6 seconds.  Compared to prior monitor results from 07/2019, significant improvement in PVC burden (23% to 5%)  3 days of data recorded on Zio monitor. Patient had a min HR of 47 bpm, max HR of 176 bpm, and avg HR of 77 bpm. Predominant underlying rhythm was Sinus Rhythm. No VT, atrial fibrillation, high degree block, or pauses noted. Three episodes of SVT, longest lasting 1 minute 6 seconds.  Isolated atrial ectopy was rare (<1%). Isolated ventricular ectopy was frequent (5.3%).  Longest episode of bigeminy lasted 3  minutes 26 seconds.  Longest episode of trigeminy lasted 1 minute and 10 seconds.  There were 0 triggered events.   CT SCANS  CT CORONARY MORPH W/CTA COR W/SCORE 12/08/2019  Addendum 12/08/2019  2:28 PM ADDENDUM REPORT: 12/08/2019 14:26  ADDENDUM: OVER-READ INTERPRETATION  CT CHEST  The following report is an over-read performed by radiologist Dr. Arliss Journey [facility] on 12/08/2019. This over-read does not include interpretation of cardiac or coronary anatomy or pathology. The coronary CTA interpretation by the cardiologist is attached.  COMPARISON:  08/16/2019  FINDINGS:  Cardiovascular: Aortic atherosclerosis. Ascending aortic dilatation. Example at 4.4 cm in the mid ascending segment on 06/16, similar. 4.2 cm on coronal image 73/20, similar. The superior most aspect of the ascending aorta, as well as the transverse aorta are not included. Normal caliber of the imaged descending thoracic aorta. No central pulmonary embolism, on this non-dedicated study.  Mediastinum/Nodes: No imaged thoracic adenopathy.  Lungs/Pleura: No pleural fluid.  Clear imaged lungs.  Upper Abdomen: Hepatic steatosis. Normal imaged portions of the stomach, spleen.  Musculoskeletal: No acute osseous abnormality. Moderate thoracic spondylosis.  IMPRESSION:  1. Similar ascending aortic dilatation, on the order of 4.4 cm. Please note that the superior most ascending and transverse aorta are not imaged on this dedicated coronary CT. Recommend annual imaging followup by CTA or MRA. This recommendation follows 2010 ACCF/AHA/AATS/ACR/ASA/SCA/SCAI/SIR/STS/SVM Guidelines for the Diagnosis and Management of Patients with Thoracic Aortic Disease. Circulation. 2010; 121: J478-G956. Aortic aneurysm NOS (ICD10-I71.9) 2.  Aortic Atherosclerosis (ICD10-I70.0). 3. Hepatic steatosis.   Electronically Signed  By: Jeronimo Greaves M.D. On: 12/08/2019 14:26  Narrative CLINICAL DATA:  Ascending  aortic aneurysm, pvc's.  EXAM: Cardiac/Coronary  CTA  TECHNIQUE: The patient was scanned on a Siemens Somatoform go.Top scanner.  FINDINGS: A retrospective scan was triggered in the descending thoracic aorta. Axial non-contrast 3 mm slices were carried out through the heart. The data set was analyzed on a dedicated work station and scored using the Agatson method. Gantry rotation speed was 330 msecs and collimation was .6 mm. 25mg  of coreg and 0.8 mg of sl NTG was given. The 3D data set was reconstructed in 5% intervals of the 5-95 % of the R-R cycle. Diastolic phases were analyzed on a dedicated work station using MPR, MIP and VRT modes. The patient received 200 cc of contrast (patient also getting ct angio chest separate study).  Aorta: Moderate dilation of ascending aorta, measuring 47mm at the level of the R. pulmonary artery. No calcifications. No dissection.  Aortic Valve:  Trileaflet.  No calcifications.  Coronary Arteries:  Normal coronary origin.  Right dominance.  RCA is a large dominant artery that gives rise to PDA and PLA. There is no plaque.  Left main is a large artery that gives rise to LAD and LCX arteries.  LAD is a large vessel that has mild calcification in the mid segment causing minimal stenosis (0-24%).  LCX is a non-dominant artery that gives rise to one large OM1 branch. There is no plaque.  Other findings:  Normal pulmonary vein drainage into the left atrium.  Normal left atrial appendage without a thrombus.  Normal size of the pulmonary artery.  IMPRESSION: 1. Coronary calcium score of 18.9. This was 84th percentile for age and sex matched control.  2. Normal coronary origin with right dominance.  3. Moderately dilated ascending aorta measuring 47mm in diameter at the level of the Right Pulmonary artery.  4.  Minimal non obstructive coronary calcification in the mid LAD  5. CAD-RADS 1. Minimal non-obstructive CAD (0-24%).  Consider non-atherosclerotic causes of chest pain. Consider preventive therapy and risk factor modification.  Electronically Signed: By: Debbe Odea M.D. On: 12/08/2019 13:30   CARDIAC MRI  MR CARDIAC MORPHOLOGY W WO CONTRAST 02/25/2021  Narrative CLINICAL DATA:  PVC, Abnormal echo R/O Sarcoid  Dilated Aortic Root  EXAM: CARDIAC MRI  TECHNIQUE: The patient was scanned on a 1.5 Tesla Siemens magnet. A dedicated cardiac coil was used. Functional imaging was done using Fiesta sequences. 2,3, and 4 chamber views were done to assess for RWMA's. Modified Simpson's rule using a short axis stack was used to calculate an ejection fraction on a dedicated work [employer]. The patient received 10 cc of Gadavist. After 10 minutes inversion recovery sequences were used to assess for infiltration and scar tissue.  CONTRAST:  Gadavist  FINDINGS: Normal atrial sizes. No ASD/PFO. No VSD. Moderate to severely dilated aortic root 4.7 cm compared to descending thoracic aorta 2.5 cm. Normal arch vessels and no coarctation. No pericardial effusion  Normal TV, MV, PV Tri leaflet AV with mild appearing central AR. Normal coronary artery origins Mild LVE with low normal EF 53% (EDV 243 cc ESV 115 cc SV 128 cc) Normal RV size and function RVEF 49% (EDV 227 cc ESV 115 cc SV 112 cc) Delayed gadolinium images with no infiltration, infarct or scar and no evidence of sarcoid Parametric measures normal ECV 42 msec, T1 global 1003 msec and T2 49 msec  IMPRESSION: 1. Low normal LV size and  function EF 53% no RWMA;s  2. No gadolinium uptake on delayed inversion recovery sequences no evidence of Sarcoid  3. Moderate to severely dilated aortic root 4.7 cm Normal arch vessels and no coarctation  4.  Tri leaflet AV with mild appearing AR  5.  Normal RV size and function RVEF 49%  6.  Normal parametric measures see values above  Charlton Haws   Electronically Signed By:  Charlton Haws M.D. On: 02/25/2021 16:09           Current Reported Medications:.    No outpatient medications have been marked as taking for the 06/23/23 encounter (Appointment) with Rip Harbour, NP.    Physical Exam:    VS:  There were no vitals taken for this visit.   Wt Readings from Last 3 Encounters:  06/08/23 276 lb (125.2 kg)  06/01/23 276 lb 11.2 oz (125.5 kg)  05/26/23 273 lb (123.8 kg)    GEN: Well nourished, well developed in no acute distress NECK: No JVD; No carotid bruits CARDIAC: ***RRR, no murmurs, rubs, gallops RESPIRATORY:  Clear to auscultation without rales, wheezing or rhonchi  ABDOMEN: Soft, non-tender, non-distended EXTREMITIES:  No edema; No acute deformity   Asessement and Plan:.     ***     Disposition: F/u with ***  Signed, Rip Harbour, NP

## 2023-06-23 ENCOUNTER — Ambulatory Visit: Payer: Medicare Other | Admitting: Cardiology

## 2023-06-23 DIAGNOSIS — I493 Ventricular premature depolarization: Secondary | ICD-10-CM

## 2023-06-23 DIAGNOSIS — I7121 Aneurysm of the ascending aorta, without rupture: Secondary | ICD-10-CM

## 2023-06-23 DIAGNOSIS — I10 Essential (primary) hypertension: Secondary | ICD-10-CM

## 2023-06-23 DIAGNOSIS — G4733 Obstructive sleep apnea (adult) (pediatric): Secondary | ICD-10-CM

## 2023-06-23 DIAGNOSIS — I2510 Atherosclerotic heart disease of native coronary artery without angina pectoris: Secondary | ICD-10-CM

## 2023-06-24 ENCOUNTER — Other Ambulatory Visit: Payer: Medicare Other

## 2023-06-26 ENCOUNTER — Ambulatory Visit: Payer: Medicare Other | Admitting: Thoracic Surgery (Cardiothoracic Vascular Surgery)

## 2023-06-26 DIAGNOSIS — I7121 Aneurysm of the ascending aorta, without rupture: Secondary | ICD-10-CM

## 2023-06-26 NOTE — Progress Notes (Signed)
[address]       [name] [address]             [phone]       Patient: Home Provider: Office Consent for Telemedicine visit obtained.  Today's visit was completed via a real-time telehealth (see specific modality noted below). The patient/authorized person provided oral consent at the time of the visit to engage in a telemedicine encounter with the present provider at [facility]. The patient/authorized person was informed of the potential benefits, limitations, and risks of telemedicine. The patient/authorized person expressed understanding that the laws that protect confidentiality also apply to telemedicine. The patient/authorized person acknowledged understanding that telemedicine does not provide emergency services and that he or she would need to call 911 or proceed to the nearest hospital for help if such a need arose.   Total time spent in the clinical discussion 10 minutes.  Telehealth Modality: Phone visit (audio only)  Recent Radiology Findings:   CT Angio Chest/Abd/Pel for Dissection W and/or Wo Contrast  Result Date: 06/18/2023 CLINICAL DATA:  Follow-up ascending aortic aneurysm. EXAM: CT ANGIOGRAPHY CHEST, ABDOMEN AND PELVIS TECHNIQUE: Non-contrast CT of the chest was initially obtained. Multidetector CT imaging through the chest, abdomen and pelvis was performed using the standard protocol during bolus administration of intravenous contrast. Multiplanar reconstructed images and MIPs were obtained and reviewed to evaluate the vascular anatomy. RADIATION DOSE REDUCTION: This exam was performed according to the departmental dose-optimization program which includes automated exposure control, adjustment of the mA and/or kV according to patient size and/or use of iterative reconstruction technique. CONTRAST:  OMNIPAQUE IOHEXOL 350 MG/ML SOLN COMPARISON:  CT of the abdomen pelvis dated 04/23/2023 and chest CT dated 12/12/2022. FINDINGS: CTA CHEST FINDINGS  Cardiovascular: There is no cardiomegaly or pericardial effusion. Mildly dilated ascending aorta measures up to 4.2 cm. No aortic dissection. There is minimal atherosclerotic calcification of the aortic arch. The origins of the great vessels of the aortic arch appear patent. No pulmonary artery embolus identified. Mediastinum/Nodes: No hilar or mediastinal adenopathy. The esophagus and the thyroid gland are grossly unremarkable. No mediastinal fluid collection. Lungs/Pleura: Right lower lobe linear atelectasis/scarring. No focal consolidation, pleural effusion, or pneumothorax. The central airways are patent. Musculoskeletal: No acute osseous pathology. Review of the MIP images confirms the above findings. CTA ABDOMEN AND PELVIS FINDINGS VASCULAR Aorta: Normal caliber aorta without aneurysm, dissection, vasculitis or significant stenosis. Celiac: Patent without evidence of aneurysm, dissection, vasculitis or significant stenosis. SMA: Patent without evidence of aneurysm, dissection, vasculitis or significant stenosis. Renals: Both renal arteries are patent without evidence of aneurysm, dissection, vasculitis, fibromuscular dysplasia or significant stenosis. IMA: Patent without evidence of aneurysm, dissection, vasculitis or significant stenosis. Inflow: Patent without evidence of aneurysm, dissection, vasculitis or significant stenosis. Veins: No obvious venous abnormality within the limitations of this arterial phase study. Review of the MIP images confirms the above findings. NON-VASCULAR No intra-abdominal free air or free fluid. Hepatobiliary: The liver is unremarkable. No biliary dilatation. The gallbladder is unremarkable. Pancreas: Unremarkable. No pancreatic ductal dilatation or surrounding inflammatory changes. Spleen: Normal in size without focal abnormality. Adrenals/Urinary Tract: The adrenal glands are unremarkable. There is no hydronephrosis on either side. The visualized ureters and urinary bladder  appear unremarkable. Stomach/Bowel: Postsurgical changes of the bowel with anastomotic staple line in the mid abdomen. There is abutment of several loops of small bowel to the anterior peritoneal wall suggestive of adhesions. There is inflammatory changes of a loop of small  bowel in the anterior abdomen adjacent to the peritoneum which may be related to hernia repair or represent enteritis. Clinical correlation recommended there is no bowel obstruction. The appendix is not visualized with certainty. No inflammatory changes identified in the right lower quadrant. Lymphatic: No adenopathy. Reproductive: The prostate and seminal vesicles are grossly unremarkable. No pelvic mass. Other: There is a 1.1 x 1.7 cm nodular density in the subcutaneous soft tissues of the anterior abdominal wall to the right of the surgical incision and above the umbilicus in the region of the previously seen hernia. This may represent a small fluid collection or postsurgical seroma related to hernia repair. No drainable fluid collection. Musculoskeletal: Degenerative changes of the spine and lower lumbar posterior fusion. No acute osseous pathology. Review of the MIP images confirms the above findings. IMPRESSION: 1. Mildly dilated ascending aorta measures up to 4.2 cm. Recommend annual imaging followup by CTA or MRA. This recommendation follows 2010 ACCF/AHA/AATS/ACR/ASA/SCA/SCAI/SIR/STS/SVM Guidelines for the Diagnosis and Management of Patients with Thoracic Aortic Disease. Circulation. 2010; 121: Z610-R604. Aortic aneurysm NOS (ICD10-I71.9) No aortic dissection. 2. Postsurgical changes of the bowel with anastomotic staple line in the mid abdomen. No bowel obstruction. 3. Inflammatory changes of a loop of small bowel in the anterior abdomen adjacent to the peritoneum which may be related to hernia repair or represent enteritis. 4. Probable small postsurgical seroma in the anterior or abdominal wall to the right of the surgical incision. No  drainable fluid collection. Electronically Signed   By: Elgie Collard M.D.   On: 06/18/2023 18:36   DG Chest Portable 1 View  Result Date: 06/18/2023 CLINICAL DATA:  chest tightness EXAM: PORTABLE CHEST 1 VIEW COMPARISON:  November 06, 2021 FINDINGS: The cardiomediastinal silhouette is unchanged and enlarged in contour. No pleural effusion. No pneumothorax. No acute pleuroparenchymal abnormality. Remote RIGHT-sided rib fractures. IMPRESSION: No acute cardiopulmonary abnormality. Electronically Signed   By: Meda Klinefelter M.D.   On: 06/18/2023 17:25    I had a telephone visit with Ricky [name]  Assessment:  52yo male with a 4.8 cm ascending aortic aneurysm.  Most recents CT chest showed that it was 4.2cm.  This is unlikely, but at the time of the scan he was admitted for a SBO, and thus may have been dehydrated.   Echocardiogram shows a tricuspid valve without evidence of regurgitation.  We discussed the natural history and and risk factors for growth of ascending aortic aneurysms.  We covered the importance of smoking cessation, tight blood pressure control, refraining from lifting heavy objects, and avoiding fluoroquinolones.  The patient is aware of signs and symptoms of aortic dissection and when to present to the emergency department.  We will continue surveillance and a repeat CT was ordered for 12 months.  Camren Henthorn Keane Scrape

## 2023-09-01 ENCOUNTER — Other Ambulatory Visit (HOSPITAL_COMMUNITY): Payer: Self-pay

## 2023-09-09 ENCOUNTER — Ambulatory Visit: Payer: Medicare Other | Admitting: Physician Assistant

## 2023-09-09 ENCOUNTER — Encounter: Payer: Self-pay | Admitting: Physician Assistant

## 2023-09-09 DIAGNOSIS — M79661 Pain in right lower leg: Secondary | ICD-10-CM | POA: Diagnosis not present

## 2023-09-09 MED ORDER — LIDOCAINE HCL 1 % IJ SOLN
3.0000 mL | INTRAMUSCULAR | Status: AC | PRN
  Administered 2023-09-09: 3 mL

## 2023-09-09 MED ORDER — METHYLPREDNISOLONE ACETATE 40 MG/ML IJ SUSP
40.0000 mg | INTRAMUSCULAR | Status: AC | PRN
  Administered 2023-09-09: 40 mg via INTRA_ARTICULAR

## 2023-09-09 NOTE — Progress Notes (Signed)
Office Visit Note   Patient: Ricky [name]           Date of Birth: 1971/02/27           MRN: [id number] Visit Date: 09/09/2023              Requested by: Maryagnes Amos, FNP [address] [place],  [place] [address] PCP: Maryagnes Amos, FNP   Assessment & Plan: Visit Diagnoses: right knee pain  Plan: Pleasant 53 year old gentleman with a 86-month history of right knee pain after a fall.  Did not have any bruising or swelling at the time.  X-rays done did not demonstrate any acute fracture.  Comes in today because he continues to have difficulties over the medial side of the knee.  He also has sharp pain when he flexes his knee straightening it makes it feel better he also feels like he has a little pain in the back of the knee.  He does have some mechanical symptoms popping and clicking but they are not painful he is status post arthroscopy on this knee several years ago he thinks for meniscus tear certainly given his symptoms could be a meniscus tear could also be arthritis.  He would like to try a steroid injection first if that does not work he is going to contact me and I will order an MRI  Follow-Up Instructions: No follow-ups on file.   Orders:  No orders of the defined types were placed in this encounter.  No orders of the defined types were placed in this encounter.     Procedures: Large Joint Inj: R knee on 09/09/2023 2:21 PM Indications: pain and diagnostic evaluation Details: 25 G 1.5 in needle, anteromedial approach  Arthrogram: No  Medications: 40 mg methylPREDNISolone acetate 40 MG/ML; 3 mL lidocaine 1 % Outcome: tolerated well, no immediate complications Procedure, treatment alternatives, risks and benefits explained, specific risks discussed. Consent was given by the patient.     Clinical Data: No additional findings.   Subjective: No chief complaint on file.   HPI patient is a pleasant 53 year old gentleman with a chief  complaint of right knee pain.  This has been going on since June 2024.  He did have an arthroscopy in this knee back in 2007 or 8.  He has had pain in the knee since a fall last year he has tried bracing and Tylenol does not help ice gives temporary relief he does have nonpainful popping and clicking no swelling  Review of Systems  All other systems reviewed and are negative.    Objective: Vital Signs: There were no vitals taken for this visit.  Physical Exam Constitutional:      Appearance: Normal appearance.  Pulmonary:     Effort: Pulmonary effort is normal.  Skin:    General: Skin is warm and dry.  Neurological:     General: No focal deficit present.     Mental Status: He is alert and oriented to person, place, and time.  Psychiatric:        Mood and Affect: Mood normal.        Behavior: Behavior normal.    Ortho Exam Examination of his right knee is neurovascular intact compartments are soft and nontender he has good flexion extension.  Good varus valgus stability he does have some fullness in the back consistent with a small Baker's cyst also some medial joint line tenderness good anterior stability negative Homans' sign no erythema or effusion  are noted Specialty Comments:  No specialty comments available.  Imaging: No results found.   PMFS History: Patient Active Problem List   Diagnosis Date Noted  . Ventral hernia without obstruction or gangrene 06/08/2023  . Fracture of phalanx of great toe 02/24/2023  . Pain in right knee 02/24/2023  . Small bowel obstruction (HCC) 05/23/2021  . Mixed hyperlipidemia 05/23/2021  . Hypokalemia due to excessive gastrointestinal loss of potassium 05/23/2021  . OSA (obstructive sleep apnea) 10/18/2019  . PVC's (premature ventricular contractions) 08/30/2019  . Essential hypertension 08/18/2019  . Thrombocytopenia (HCC) 07/07/2012  . 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 Anxiety   .  Back pain   . 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 Chest pain   . Depression   . High cholesterol   . History of kidney stones   . Hypertension   . Osteoarthritis   . Osteoarthritis   . Overweight   . PONV (postoperative nausea and vomiting)   . Pre-diabetes   . Sleep apnea   . SOB (shortness of breath)   . Thrombocytopenia (HCC) 07/07/2012   138,000 08/26/11; 98,000 06/29/12    Family History  Problem Relation Age of Onset  . Diabetes Mother   . Hypertension Mother   . Hyperlipidemia Mother   . Stroke Mother   . Anxiety disorder Mother   . Obesity Mother   . Hypertension Father   . Hyperlipidemia Father   . Colon cancer Neg Hx   . Colon polyps Neg Hx   . Esophageal cancer Neg Hx   . Liver cancer Neg Hx     Past Surgical History:  Procedure Laterality Date  . ADENOIDECTOMY    . ANTERIOR FUSION CERVICAL SPINE    . APPENDECTOMY    . BOWEL RESECTION N/A 05/24/2021   Procedure: SMALL BOWEL RESECTION;  Surgeon: Griselda Miner, MD;  Location: [facility] OR;  Service: General;  Laterality: N/A;  . HERNIA REPAIR     umbilical as a child  . INSERTION OF MESH N/A 06/08/2023   Procedure: INSERTION OF MESH;  Surgeon: Griselda Miner, MD;  Location: [facility] OR;  Service: General;  Laterality: N/A;  . IRRIGATION AND DEBRIDEMENT SEBACEOUS CYST     on scalp  . KNEE ARTHROSCOPY Bilateral    x2  . LAPAROSCOPY N/A 05/24/2021   Procedure: LAPAROSCOPY DIAGNOSTIC;  Surgeon: Griselda Miner, MD;  Location: [facility] OR;  Service: General;  Laterality: N/A;  . LAPAROTOMY N/A 05/24/2021   Procedure: EXPLORATORY LAPAROTOMY;  Surgeon: Griselda Miner, MD;  Location: [facility] OR;  Service: General;  Laterality: N/A;  . LUMBAR FUSION    . POSTERIOR FUSION CERVICAL SPINE    . TONSILLECTOMY    . VENTRAL HERNIA REPAIR N/A 06/08/2023   Procedure: LAPAROSCOPIC ASSISTED VENTRAL HERNIA REPAIR WITH MESH;  Surgeon: Griselda Miner, MD;  Location: [facility] OR;  Service: General;  Laterality: N/A;    Social History   Occupational History  . Occupation: Disabled  Tobacco Use  . Smoking status: Former    Current packs/day: 0.00    Average packs/day: 0.5 packs/day for 6.0 years (3.0 ttl pk-yrs)    Types: Cigarettes    Start date: 2007    Quit date: 2013    Years since quitting: 12.0  . Smokeless tobacco: Never  Vaping Use  . Vaping status: Never Used  Substance and Sexual Activity  . Alcohol use: Yes  Comment: rarely  . Drug use: No  . Sexual activity: Not on file

## 2023-11-09 ENCOUNTER — Other Ambulatory Visit: Payer: Self-pay | Admitting: Cardiology

## 2024-03-05 ENCOUNTER — Emergency Department (HOSPITAL_BASED_OUTPATIENT_CLINIC_OR_DEPARTMENT_OTHER)

## 2024-03-05 ENCOUNTER — Emergency Department (HOSPITAL_BASED_OUTPATIENT_CLINIC_OR_DEPARTMENT_OTHER)
Admission: EM | Admit: 2024-03-05 | Discharge: 2024-03-06 | Disposition: A | Discharge status: Home / Self Care | Attending: Emergency Medicine | Admitting: Emergency Medicine

## 2024-03-05 ENCOUNTER — Other Ambulatory Visit: Payer: Self-pay

## 2024-03-05 DIAGNOSIS — R072 Precordial pain: Secondary | ICD-10-CM | POA: Insufficient documentation

## 2024-03-05 LAB — CBC
HCT: 45.3 % (ref 39.0–52.0)
Hemoglobin: 14.7 g/dL (ref 13.0–17.0)
MCH: 29 pg (ref 26.0–34.0)
MCHC: 32.5 g/dL (ref 30.0–36.0)
MCV: 89.3 fL (ref 80.0–100.0)
Platelets: 156 K/uL (ref 150–400)
RBC: 5.07 MIL/uL (ref 4.22–5.81)
RDW: 14.1 % (ref 11.5–15.5)
WBC: 6.9 K/uL (ref 4.0–10.5)
nRBC: 0 % (ref 0.0–0.2)

## 2024-03-05 LAB — D-DIMER, QUANTITATIVE: D-Dimer, Quant: <0.27 ug{FEU}/mL (ref 0.00–0.50)

## 2024-03-05 LAB — BASIC METABOLIC PANEL WITH GFR
Anion gap: 12 (ref 5–15)
BUN: 12 mg/dL (ref 6–20)
CO2: 28 mmol/L (ref 22–32)
Calcium: 9.4 mg/dL (ref 8.9–10.3)
Chloride: 97 mmol/L — ABNORMAL LOW (ref 98–111)
Creatinine, Ser: 0.88 mg/dL (ref 0.61–1.24)
GFR, Estimated: >60 mL/min (ref >60)
Glucose, Bld: 145 mg/dL — ABNORMAL HIGH (ref 70–99)
Potassium: 3.5 mmol/L (ref 3.5–5.1)
Sodium: 136 mmol/L (ref 135–145)

## 2024-03-05 LAB — TROPONIN T, HIGH SENSITIVITY
Troponin T High Sensitivity: <15 ng/L (ref <19)
Troponin T High Sensitivity: <15 ng/L (ref <19)

## 2024-03-05 MED ORDER — IOHEXOL 350 MG/ML SOLN
75.0000 mL | Freq: Once | INTRAVENOUS | Status: AC | PRN
  Administered 2024-03-05: 75 mL via INTRAVENOUS

## 2024-03-05 NOTE — ED Provider Notes (Signed)
 [facility] AT [facility] Provider Note   CSN: [id number] Arrival date & time: 03/05/24  2119     Patient presents with: Chest Pain   Ricky [name] is a 53 y.o. male.   Pt with c/o localized left chest pain in past couple days. Occurs at rest, not related to exertion/activity. Lasts very briefly up to a few minutes. No constant and/or pleuritic chest pain. Hx aortic aneurysm, indicates has been stable, 5 cm, gets check on annual imaging. No severe and or persistent chest pain. No back pain. No cough or uri symptoms. No fever or chills. No leg swelling. No hx dvt or pe. No hx cad. No heartburn.   The history is provided by the patient and medical records.  Chest Pain Associated symptoms: no abdominal pain, no back pain, no cough, no diaphoresis, no fever, no headache, no nausea, no shortness of breath and no vomiting        Prior to Admission medications   Medication Sig Start Date End Date Taking? Authorizing Provider  acetaminophen  (TYLENOL ) 500 MG tablet Take 1 tablet (500 mg total) by mouth every 6 (six) hours as needed. 02/10/23   Charlyn Sora, MD  atorvastatin  (LIPITOR) 40 MG tablet Take 1 tablet (40 mg total) by mouth daily. 12/01/22 11/26/23  Kate Lonni LITTIE, MD  Blood Pressure Monitoring (BLOOD PRESSURE CUFF) MISC XL Adult blood pressure cuff 08/18/19   Kate Lonni LITTIE, MD  carvedilol  (COREG ) 25 MG tablet TAKE 1 TABLET(25 MG) BY MOUTH TWICE DAILY WITH A MEAL 11/04/21   Kate Lonni LITTIE, MD  cyclobenzaprine  (FLEXERIL ) 10 MG tablet Take 10 mg by mouth at bedtime as needed for muscle spasms. 05/29/21   [provider]  EDARBYCLOR 40-25 MG TABS TAKE 1 TABLET DAILY (DOSE INCREASE) 12/23/22   Kate Lonni LITTIE, MD  hydrOXYzine  (VISTARIL ) 50 MG capsule Take 50 mg by mouth at bedtime as needed (sleep).    [provider]  ibuprofen (ADVIL) 800 MG tablet Take 800 mg by mouth every 8 (eight) hours as needed for  moderate pain.    [provider]  KLOR-CON  M20 20 MEQ tablet TAKE 1 TABLET DAILY 11/10/23   Kate Lonni LITTIE, MD  NOREL AD 4-10-325 MG TABS Take 1 tablet by mouth daily. 05/29/23   [provider]  ondansetron  (ZOFRAN -ODT) 4 MG disintegrating tablet Take 1 tablet (4 mg total) by mouth every 8 (eight) hours as needed for nausea or vomiting. 11/06/21   Jerrol Lynwood, MD  oxyCODONE  (ROXICODONE ) 5 MG immediate release tablet Take 1 tablet (5 mg total) by mouth every 6 (six) hours as needed for severe pain (pain score 7-10). 06/09/23   Curvin Mt III, MD  polyethylene glycol powder (GLYCOLAX /MIRALAX ) 17 GM/SCOOP powder Take 17 g by mouth daily. Patient taking differently: Take 17 g by mouth daily as needed for moderate constipation. 04/23/23   Dameron, Marisa, DO  rosuvastatin  (CRESTOR ) 10 MG tablet Take 10 mg by mouth at bedtime. 02/01/21   [provider]  senna-docusate (SENOKOT-S) 8.6-50 MG tablet Take 1 tablet by mouth daily. 04/23/23   Dameron, Marisa, DO  sildenafil  (VIAGRA ) 100 MG tablet TAKE 1/2 TO 1 TABLET BY MOUTH AS NEEDED. Defer further refills to PCP. 04/04/21   Kate Lonni LITTIE, MD    Allergies: Patient has no known allergies.    Review of Systems  Constitutional:  Negative for diaphoresis and fever.  HENT:  Negative for sore throat.   Respiratory:  Negative for cough  and shortness of breath.   Cardiovascular:  Positive for chest pain. Negative for leg swelling.  Gastrointestinal:  Negative for abdominal pain, nausea and vomiting.  Genitourinary:  Negative for flank pain.  Musculoskeletal:  Negative for back pain and neck pain.  Neurological:  Negative for headaches.    Updated Vital Signs BP 120/86   Pulse 69   Temp 98.6 F (37 C) (Oral)   Resp 12   SpO2 98%   Physical Exam Vitals and nursing note reviewed.  Constitutional:      Appearance: Normal appearance. He is well-developed.  HENT:     Head: Atraumatic.     Nose: Nose normal.      Mouth/Throat:     Mouth: Mucous membranes are moist.  Eyes:     General: No scleral icterus.    Conjunctiva/sclera: Conjunctivae normal.  Neck:     Trachea: No tracheal deviation.  Cardiovascular:     Rate and Rhythm: Normal rate and regular rhythm.     Pulses: Normal pulses.     Heart sounds: Normal heart sounds. No murmur heard.    No friction rub. No gallop.  Pulmonary:     Effort: Pulmonary effort is normal. No accessory muscle usage or respiratory distress.     Breath sounds: Normal breath sounds.  Chest:     Chest wall: No tenderness.  Abdominal:     General: Bowel sounds are normal. There is no distension.     Palpations: Abdomen is soft. There is no mass.     Tenderness: There is no abdominal tenderness. There is no guarding.  Genitourinary:    Comments: No cva tenderness. Musculoskeletal:        General: No swelling or tenderness.     Cervical back: Normal range of motion and neck supple. No rigidity.     Right lower leg: No edema.     Left lower leg: No edema.  Skin:    General: Skin is warm and dry.     Findings: No rash.  Neurological:     Mental Status: He is alert.     Comments: Alert, speech clear.   Psychiatric:        Mood and Affect: Mood normal.     (all labs ordered are listed, but only abnormal results are displayed) Results for orders placed or performed during the hospital encounter of 03/05/24  Basic metabolic panel   Collection Time: 03/05/24  9:32 PM  Result Value Ref Range   Sodium 136 135 - 145 mmol/L   Potassium 3.5 3.5 - 5.1 mmol/L   Chloride 97 (L) 98 - 111 mmol/L   CO2 28 22 - 32 mmol/L   Glucose, Bld 145 (H) 70 - 99 mg/dL   BUN 12 6 - 20 mg/dL   Creatinine, Ser 9.11 0.61 - 1.24 mg/dL   Calcium  9.4 8.9 - 10.3 mg/dL   GFR, Estimated >39 >39 mL/min   Anion gap 12 5 - 15  CBC   Collection Time: 03/05/24  9:32 PM  Result Value Ref Range   WBC 6.9 4.0 - 10.5 K/uL   RBC 5.07 4.22 - 5.81 MIL/uL   Hemoglobin 14.7 13.0 - 17.0 g/dL    HCT 54.6 60.9 - 47.9 %   MCV 89.3 80.0 - 100.0 fL   MCH 29.0 26.0 - 34.0 pg   MCHC 32.5 30.0 - 36.0 g/dL   RDW 85.8 88.4 - 84.4 %   Platelets 156 150 - 400 K/uL   nRBC 0.0  0.0 - 0.2 %  Troponin T, High Sensitivity   Collection Time: 03/05/24  9:32 PM  Result Value Ref Range   Troponin T High Sensitivity <15 <19 ng/L  D-dimer, quantitative   Collection Time: 03/05/24 10:10 PM  Result Value Ref Range   D-Dimer, Quant <0.27 0.00 - 0.50 ug/mL-FEU   DG Chest Port 1 View Result Date: 03/05/2024 CLINICAL DATA:  Chest pain. EXAM: PORTABLE CHEST 1 VIEW COMPARISON:  June 18, 2023 FINDINGS: The heart size and mediastinal contours are within normal limits. Both lungs are clear. Chronic right-sided rib fractures are seen. Postoperative changes are noted within the lower cervical spine. The visualized skeletal structures are otherwise unremarkable. IMPRESSION: No active disease. Electronically Signed   By: Suzen Dials M.D.   On: 03/05/2024 22:01     EKG: EKG Interpretation Date/Time:  Saturday March 05 2024 21:25:41 EDT Ventricular Rate:  80 PR Interval:  158 QRS Duration:  96 QT Interval:  372 QTC Calculation: 429 R Axis:   -14  Text Interpretation: Normal sinus rhythm Incomplete right bundle branch block Confirmed by Bernard Drivers ([id number]) on 03/05/2024 9:59:18 PM  Radiology: [facility] Chest Port 1 View Result Date: 03/05/2024 CLINICAL DATA:  Chest pain. EXAM: PORTABLE CHEST 1 VIEW COMPARISON:  June 18, 2023 FINDINGS: The heart size and mediastinal contours are within normal limits. Both lungs are clear. Chronic right-sided rib fractures are seen. Postoperative changes are noted within the lower cervical spine. The visualized skeletal structures are otherwise unremarkable. IMPRESSION: No active disease. Electronically Signed   By: Suzen Dials M.D.   On: 03/05/2024 22:01     Procedures   Medications Ordered in the ED  iohexol  (OMNIPAQUE ) 350 MG/ML injection 75 mL (has no  administration in time range)                                    Medical Decision Making Problems Addressed: Precordial chest pain: acute illness or injury with systemic symptoms that poses a threat to life or bodily functions  Amount and/or Complexity of Data Reviewed External Data Reviewed: radiology and notes. Labs: ordered. Decision-making details documented in ED Course. Radiology: ordered and independent interpretation performed. Decision-making details documented in ED Course. ECG/medicine tests: ordered and independent interpretation performed. Decision-making details documented in ED Course.  Risk Prescription drug management. Decision regarding hospitalization.   Iv ns. Continuous pulse ox and cardiac monitoring. Labs ordered/sent. Imaging ordered.   Differential diagnosis includes acs, msk cp, gi cp, pe, etc. Dispo decision including potential need for admission considered - will get labs and imaging and reassess.   Reviewed nursing notes and prior charts for additional history. External reports reviewed.   Cardiac monitor: sinus rhythm, rate 77.  Labs reviewed/interpreted by me - trop normal. Wbc and hgb normal. Chem unremarkable.   Xrays reviewed/interpreted by me - no pna.   Pt requests ct chest to check on his aneurysm, wants to make sure its not enlarging, and indicates will not sleep unless able to image. Ordered.   CT reviewed/interpreted by me - pnd.   2330 ct and delta trop pending - signed out to Dr Roselyn to check pending studies and dispo appropriately.       Final diagnoses:  Precordial chest pain    ED Discharge Orders          Ordered    Ambulatory referral to Cardiology       Comments:  If you have not heard from the Cardiology office within the next 72 hours please call [phone].   03/05/24 2332               Bernard Drivers, MD 03/05/24 2334

## 2024-03-05 NOTE — Discharge Instructions (Addendum)
 It was our pleasure to provide your ER care today - we hope that you feel better.  For recent chest pain, follow up closely with cardiologist in the next 1-2 weeks - we made referral, and they should be contacting you with an appointment in the next few days.   Return to ER right away if worse, new symptoms, fevers, recurrent/persistent chest pain, increased trouble breathing, or other concern.

## 2024-03-05 NOTE — ED Provider Notes (Signed)
 Care assumed at shift change pending CTA chest to eval change in known ascending thoracic aneurysm. CT shows similar size to previous without any acute complications. Patient was reassured his CT was unchanged, trops and dimer are negative. No emergent cause of his pain identified tonight and clear for discharge. PCP/Vascular follow up, RTED for any other concerns.     Roselyn Carlin NOVAK, MD 03/05/24 [phone]

## 2024-03-05 NOTE — ED Triage Notes (Signed)
 POV chest pain 6 out 10 on the center and left chest side. Pt complaints of right leg pain yesterday that felt different from chronic knee pain, however chest pain that started today.   PT has hx of enlarged aorta.

## 2024-04-29 ENCOUNTER — Emergency Department (HOSPITAL_BASED_OUTPATIENT_CLINIC_OR_DEPARTMENT_OTHER)
Admission: EM | Admit: 2024-04-29 | Discharge: 2024-04-29 | Disposition: A | Discharge status: Home / Self Care | Attending: Emergency Medicine | Admitting: Emergency Medicine

## 2024-04-29 ENCOUNTER — Encounter (HOSPITAL_BASED_OUTPATIENT_CLINIC_OR_DEPARTMENT_OTHER): Payer: Self-pay

## 2024-04-29 ENCOUNTER — Emergency Department (HOSPITAL_BASED_OUTPATIENT_CLINIC_OR_DEPARTMENT_OTHER)

## 2024-04-29 ENCOUNTER — Other Ambulatory Visit: Payer: Self-pay

## 2024-04-29 ENCOUNTER — Emergency Department (HOSPITAL_BASED_OUTPATIENT_CLINIC_OR_DEPARTMENT_OTHER): Admitting: Radiology

## 2024-04-29 DIAGNOSIS — I10 Essential (primary) hypertension: Secondary | ICD-10-CM | POA: Insufficient documentation

## 2024-04-29 DIAGNOSIS — M7121 Synovial cyst of popliteal space [Baker], right knee: Secondary | ICD-10-CM | POA: Insufficient documentation

## 2024-04-29 DIAGNOSIS — F439 Reaction to severe stress, unspecified: Secondary | ICD-10-CM | POA: Insufficient documentation

## 2024-04-29 DIAGNOSIS — M25561 Pain in right knee: Secondary | ICD-10-CM | POA: Diagnosis present

## 2024-04-29 DIAGNOSIS — R0789 Other chest pain: Secondary | ICD-10-CM | POA: Insufficient documentation

## 2024-04-29 DIAGNOSIS — Z79899 Other long term (current) drug therapy: Secondary | ICD-10-CM | POA: Diagnosis not present

## 2024-04-29 LAB — CBC
HCT: 42.2 % (ref 39.0–52.0)
Hemoglobin: 13.7 g/dL (ref 13.0–17.0)
MCH: 29.1 pg (ref 26.0–34.0)
MCHC: 32.5 g/dL (ref 30.0–36.0)
MCV: 89.8 fL (ref 80.0–100.0)
Platelets: 142 K/uL — ABNORMAL LOW (ref 150–400)
RBC: 4.7 MIL/uL (ref 4.22–5.81)
RDW: 13.7 % (ref 11.5–15.5)
WBC: 7 K/uL (ref 4.0–10.5)
nRBC: 0 % (ref 0.0–0.2)

## 2024-04-29 LAB — TROPONIN T, HIGH SENSITIVITY: Troponin T High Sensitivity: <15 ng/L (ref 0–19)

## 2024-04-29 LAB — BASIC METABOLIC PANEL WITH GFR
Anion gap: 12 (ref 5–15)
BUN: 16 mg/dL (ref 6–20)
CO2: 26 mmol/L (ref 22–32)
Calcium: 9.6 mg/dL (ref 8.9–10.3)
Chloride: 99 mmol/L (ref 98–111)
Creatinine, Ser: 0.89 mg/dL (ref 0.61–1.24)
GFR, Estimated: >60 mL/min (ref >60)
Glucose, Bld: 100 mg/dL — ABNORMAL HIGH (ref 70–99)
Potassium: 3.7 mmol/L (ref 3.5–5.1)
Sodium: 137 mmol/L (ref 135–145)

## 2024-04-29 LAB — D-DIMER, QUANTITATIVE: D-Dimer, Quant: 0.31 ug{FEU}/mL (ref 0.00–0.50)

## 2024-04-29 LAB — PROTIME-INR
INR: 1 (ref 0.8–1.2)
Prothrombin Time: 13.8 s (ref 11.4–15.2)

## 2024-04-29 NOTE — ED Notes (Signed)
 DC paperwork given and verbally understood.

## 2024-04-29 NOTE — ED Triage Notes (Signed)
 Pt reports R leg pain x2 days and then went away. Pt reports then feeling chest heaviness afterwards starting today. Pt denies any N/V or SOB. Pt denies any recent long distance travel or hx of blood clots.

## 2024-04-29 NOTE — ED Provider Notes (Signed)
 [place] EMERGENCY DEPARTMENT AT [facility] Provider Note   CSN: [id number] Arrival date & time: 04/29/24  1426     Patient presents with: Leg Pain and Chest Pain   Ricky [name] is a 53 y.o. male.  {Add pertinent medical, surgical, social history, OB history to HPI:6661} 53 year old male with history of hypertension, hyperlipidemia, and osteoarthritis who presents emergency department with leg pain and chest heaviness.  Patient reports that this morning he started having pain in his popliteal fossa of the right knee.  No known injuries.  No leg swelling.  Says that he took a nap and woke up at 9 AM and started having some chest heaviness.  Says it is come and gone today.  Mild.  Not exertional or pleuritic.  No diaphoresis or vomiting.  Mild cough but no shortness of breath.  Says he has been under lots of stress and thinks that it could be related to the chest heaviness as well.  No history of DVT or PE.  No recent surgery.  No history of cancer.  No hormone use       Prior to Admission medications   Medication Sig Start Date End Date Taking? Authorizing Provider  acetaminophen  (TYLENOL ) 500 MG tablet Take 1 tablet (500 mg total) by mouth every 6 (six) hours as needed. 02/10/23   Charlyn Sora, MD  atorvastatin  (LIPITOR) 40 MG tablet Take 1 tablet (40 mg total) by mouth daily. 12/01/22 11/26/23  Kate Lonni LITTIE, MD  Blood Pressure Monitoring (BLOOD PRESSURE CUFF) MISC XL Adult blood pressure cuff 08/18/19   Kate Lonni LITTIE, MD  carvedilol  (COREG ) 25 MG tablet TAKE 1 TABLET(25 MG) BY MOUTH TWICE DAILY WITH A MEAL 11/04/21   Kate Lonni LITTIE, MD  cyclobenzaprine  (FLEXERIL ) 10 MG tablet Take 10 mg by mouth at bedtime as needed for muscle spasms. 05/29/21   [provider]  EDARBYCLOR 40-25 MG TABS TAKE 1 TABLET DAILY (DOSE INCREASE) 12/23/22   Kate Lonni LITTIE, MD  hydrOXYzine  (VISTARIL ) 50 MG capsule Take 50 mg by mouth at bedtime as  needed (sleep).    [provider]  ibuprofen (ADVIL) 800 MG tablet Take 800 mg by mouth every 8 (eight) hours as needed for moderate pain.    [provider]  KLOR-CON  M20 20 MEQ tablet TAKE 1 TABLET DAILY 11/10/23   Kate Lonni LITTIE, MD  NOREL AD 4-10-325 MG TABS Take 1 tablet by mouth daily. 05/29/23   [provider]  ondansetron  (ZOFRAN -ODT) 4 MG disintegrating tablet Take 1 tablet (4 mg total) by mouth every 8 (eight) hours as needed for nausea or vomiting. 11/06/21   Jerrol Lynwood, MD  oxyCODONE  (ROXICODONE ) 5 MG immediate release tablet Take 1 tablet (5 mg total) by mouth every 6 (six) hours as needed for severe pain (pain score 7-10). 06/09/23   Curvin Mt III, MD  polyethylene glycol powder (GLYCOLAX /MIRALAX ) 17 GM/SCOOP powder Take 17 g by mouth daily. Patient taking differently: Take 17 g by mouth daily as needed for moderate constipation. 04/23/23   Dameron, Marisa, DO  rosuvastatin  (CRESTOR ) 10 MG tablet Take 10 mg by mouth at bedtime. 02/01/21   [provider]  senna-docusate (SENOKOT-S) 8.6-50 MG tablet Take 1 tablet by mouth daily. 04/23/23   Dameron, Marisa, DO  sildenafil  (VIAGRA ) 100 MG tablet TAKE 1/2 TO 1 TABLET BY MOUTH AS NEEDED. Defer further refills to PCP. 04/04/21   Kate Lonni LITTIE, MD    Allergies: Patient has no known allergies.  Review of Systems  Updated Vital Signs BP 129/85 (BP Location: Right Arm)   Pulse (!) 50   Temp 98.9 F (37.2 C) (Oral)   Resp 18   Ht 6' (1.829 m)   Wt 122.5 kg   SpO2 99%   BMI 36.62 kg/m   Physical Exam Vitals and nursing note reviewed.  Constitutional:      General: He is not in acute distress.    Appearance: He is well-developed.  HENT:     Head: Normocephalic and atraumatic.     Right Ear: External ear normal.     Left Ear: External ear normal.     Nose: Nose normal.  Eyes:     Extraocular Movements: Extraocular movements intact.     Conjunctiva/sclera: Conjunctivae  normal.     Pupils: Pupils are equal, round, and reactive to light.  Cardiovascular:     Rate and Rhythm: Normal rate and regular rhythm.     Heart sounds: Normal heart sounds.     Comments: Radial pulses 2+ bilaterally Pulmonary:     Effort: Pulmonary effort is normal. No respiratory distress.     Breath sounds: Normal breath sounds.  Musculoskeletal:     Cervical back: Normal range of motion and neck supple.     Right lower leg: No edema.     Left lower leg: No edema.     Comments: Tenderness palpation of the right popliteal fossa.  Full range of motion of the right knee.  No knee effusion noted.  DP pulses 2+ bilaterally  Skin:    General: Skin is warm and dry.  Neurological:     Mental Status: He is alert. Mental status is at baseline.  Psychiatric:        Mood and Affect: Mood normal.        Behavior: Behavior normal.     (all labs ordered are listed, but only abnormal results are displayed) Labs Reviewed  BASIC METABOLIC PANEL WITH GFR - Abnormal; Notable for the following components:      Result Value   Glucose, Bld 100 (*)    All other components within normal limits  CBC - Abnormal; Notable for the following components:   Platelets 142 (*)    All other components within normal limits  PROTIME-INR  D-DIMER, QUANTITATIVE  TROPONIN T, HIGH SENSITIVITY    EKG: EKG Interpretation Date/Time:  Friday April 29 2024 15:01:39 EDT Ventricular Rate:  56 PR Interval:  154 QRS Duration:  92 QT Interval:  388 QTC Calculation: 374 R Axis:   -11  Text Interpretation: Sinus bradycardia Otherwise normal ECG When compared with ECG of 05-Mar-2024 21:25, QT has shortened Confirmed by Yolande Charleston [phone]) on 04/29/2024 4:10:01 PM  Radiology: DG Chest 2 View Result Date: 04/29/2024 CLINICAL DATA:  Chest pressure EXAM: CHEST - 2 VIEW COMPARISON:  March 05, 2024 FINDINGS: No focal consolidations.  No pleural effusions.  No pneumothorax. Unchanged mild cardiomegaly. Old  right-sided rib fractures. Partially imaged ACDF hardware. IMPRESSION: No acute findings.  Unchanged mild cardiomegaly. Electronically Signed   By: Michaeline Blanch M.D.   On: 04/29/2024 15:55   US  Venous Img Lower Unilateral Right (DVT) Result Date: 04/29/2024 CLINICAL DATA:  Lower extremity pain EXAM: Right LOWER EXTREMITY VENOUS DOPPLER ULTRASOUND TECHNIQUE: Gray-scale sonography with compression, as well as color and duplex ultrasound, were performed to evaluate the deep venous system(s) from the level of the common femoral vein through the popliteal and proximal calf veins. COMPARISON:  None Available. FINDINGS:  VENOUS Normal compressibility of the common femoral, superficial femoral, and popliteal veins, as well as the visualized calf veins. Visualized portions of profunda femoral vein and great saphenous vein unremarkable. No filling defects to suggest DVT on grayscale or color Doppler imaging. Doppler waveforms show normal direction of venous flow, normal respiratory plasticity and response to augmentation. Limited views of the contralateral common femoral vein are unremarkable. OTHER Complex popliteal fossa cyst measuring 4.9 x 1 x 2.6 cm. Limitations: none IMPRESSION: Negative. Electronically Signed   By: Luke Bun M.D.   On: 04/29/2024 15:40    {Document cardiac monitor, telemetry assessment procedure when appropriate:32947} Procedures   Medications Ordered in the ED - No data to display    {Click here for ABCD2, HEART and other calculators REFRESH Note before signing:1}                              Medical Decision Making Amount and/or Complexity of Data Reviewed Labs: ordered. Radiology: ordered.   ***  {Document critical care time when appropriate  Document review of labs and clinical decision tools ie CHADS2VASC2, etc  Document your independent review of radiology images and any outside records  Document your discussion with family members, caretakers and with consultants   Document social determinants of health affecting pt's care  Document your decision making why or why not admission, treatments were needed:32947:::1}   Final diagnoses:  None    ED Discharge Orders     None

## 2024-04-29 NOTE — Discharge Instructions (Signed)
 You were seen for your leg pain (popliteal cyst) and chest tightness in the emergency department.  Your D-dimer.  Pain is likely not from of pulmonary embolism or blood clot.  At home, please try breathing exercises if you are feeling stressed and Tylenol  and ibuprofen for your pain.  You may use a knee sleeve or Ace wrap for your knee as well.    Check your MyChart online for the results of any tests that had not resulted by the time you left the emergency department.   Follow-up with your primary doctor in 2-3 days regarding your visit.  Follow-up with orthopedics in 1 to 2 weeks about your knee.  Return immediately to the emergency department if you experience any of the following: Difficulty breathing, worsening chest pain, or any other concerning symptoms.    Thank you for visiting our Emergency Department. It was a pleasure taking care of you today.

## 2024-05-06 ENCOUNTER — Encounter: Payer: Self-pay | Admitting: Physician Assistant

## 2024-05-06 ENCOUNTER — Ambulatory Visit (INDEPENDENT_AMBULATORY_CARE_PROVIDER_SITE_OTHER): Admitting: Physician Assistant

## 2024-05-06 ENCOUNTER — Other Ambulatory Visit: Payer: Self-pay

## 2024-05-06 DIAGNOSIS — M25561 Pain in right knee: Secondary | ICD-10-CM

## 2024-05-06 DIAGNOSIS — G8929 Other chronic pain: Secondary | ICD-10-CM | POA: Diagnosis not present

## 2024-05-06 MED ORDER — MELOXICAM 7.5 MG PO TABS: 7.5000 mg | ORAL_TABLET | Freq: Every day | ORAL | 0 refills | Status: AC

## 2024-05-06 MED ORDER — METHYLPREDNISOLONE ACETATE 40 MG/ML IJ SUSP
40.0000 mg | INTRAMUSCULAR | Status: AC | PRN
  Administered 2024-05-06: 40 mg via INTRA_ARTICULAR

## 2024-05-06 MED ORDER — LIDOCAINE HCL 1 % IJ SOLN
3.0000 mL | INTRAMUSCULAR | Status: AC | PRN
  Administered 2024-05-06: 3 mL

## 2024-05-06 NOTE — Progress Notes (Signed)
 Office Visit Note   Patient: Ricky [name]           Date of Birth: 1971/03/18           MRN: [id number] Visit Date: 05/06/2024              Requested by: Wilkie Majel RAMAN, FNP [address] [place],  [place] [address] PCP: Wilkie Majel RAMAN, FNP   Assessment & Plan: Visit Diagnoses:  1. Chronic pain of right knee     Plan: Pleasant 53 year old gentleman comes in for with history of right knee pain.  This was significant enough that he was seen and evaluated and ultrasounds were done to rule out DVT.  He does have some early arthritis of his right knee denies any injury.  But awakens him at night the ultrasounds did not demonstrate any evidence of a DVT.  He does have a small Baker's cyst.  Was wondering if this could be drained as this is where most of his pain is I cannot palpated and certainly I explained to him this would not be something that would be appropriate to do.  We could try a steroid injection we will see how this goes also try him on a couple weeks of meloxicam  will follow-up with me if he does not get any better  Follow-Up Instructions: Return if symptoms worsen or fail to improve.   Orders:  Orders Placed This Encounter  Procedures  . XR KNEE 3 VIEW RIGHT   Meds ordered this encounter  Medications  . meloxicam  (MOBIC ) 7.5 MG tablet    Sig: Take 1 tablet (7.5 mg total) by mouth daily.    Dispense:  30 tablet    Refill:  0      Procedures: Large Joint Inj: R knee on 05/06/2024 3:04 PM Indications: pain and diagnostic evaluation Details: 25 G 1.5 in needle, anteromedial approach  Arthrogram: No  Medications: 40 mg methylPREDNISolone  acetate 40 MG/ML; 3 mL lidocaine  1 % Outcome: tolerated well, no immediate complications Procedure, treatment alternatives, risks and benefits explained, specific risks discussed. Consent was given by the patient.      Clinical Data: No additional findings.   Subjective: Chief Complaint  Patient  presents with  . Right Knee - Pain    HPI pleasant 53 year old gentleman comes in today with chief complaint of right posterior knee pain.  Denies any particular injury.  This is bad enough that he was seen and evaluated in the emergency room.  Ultrasound was done and ruled out a DVT but did note a Baker's cyst.  No fever or chills  Review of Systems  All other systems reviewed and are negative.    Objective: Vital Signs: There were no vitals taken for this visit.  Physical Exam Constitutional:      Appearance: Normal appearance.  Pulmonary:     Effort: Pulmonary effort is normal.     Breath sounds: Normal breath sounds.  Skin:    General: Skin is warm and dry.  Neurological:     General: No focal deficit present.     Mental Status: He is alert and oriented to person, place, and time.  Psychiatric:        Mood and Affect: Mood normal.        Behavior: Behavior normal.     Ortho Exam Examination of his knee he has no crepitus no tenderness over the medial lateral joint line no effusion.  No erythema.  No real  tenderness over the front of the knee.  Has a little bit of tenderness to deep palpation in the posterior fossa especially with flexion and extension. Specialty Comments:  No specialty comments available.  Imaging: No results found.   PMFS History: Patient Active Problem List   Diagnosis Date Noted  . Ventral hernia without obstruction or gangrene 06/08/2023  . Fracture of phalanx of great toe 02/24/2023  . Pain in right knee 02/24/2023  . Small bowel obstruction (HCC) 05/23/2021  . Mixed hyperlipidemia 05/23/2021  . Hypokalemia due to excessive gastrointestinal loss of potassium 05/23/2021  . OSA (obstructive sleep apnea) 10/18/2019  . PVC's (premature ventricular contractions) 08/30/2019  . Essential hypertension 08/18/2019  . Thrombocytopenia (HCC) 07/07/2012  . 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 Anxiety   .  Back pain   . Cervical disc herniation 07/07/2012   Eval Dr Oneil Carwin, Neurosurgery 10/13  . Chest pain   . Depression   . High cholesterol   . History of kidney stones   . Hypertension   . Osteoarthritis   . Osteoarthritis   . Overweight   . PONV (postoperative nausea and vomiting)   . Pre-diabetes   . Sleep apnea   . SOB (shortness of breath)   . Thrombocytopenia (HCC) 07/07/2012   138,000 08/26/11; 98,000 06/29/12    Family History  Problem Relation Age of Onset  . Diabetes Mother   . Hypertension Mother   . Hyperlipidemia Mother   . Stroke Mother   . Anxiety disorder Mother   . Obesity Mother   . Hypertension Father   . Hyperlipidemia Father   . Colon cancer Neg Hx   . Colon polyps Neg Hx   . Esophageal cancer Neg Hx   . Liver cancer Neg Hx     Past Surgical History:  Procedure Laterality Date  . ADENOIDECTOMY    . ANTERIOR FUSION CERVICAL SPINE    . APPENDECTOMY    . BOWEL RESECTION N/A 05/24/2021   Procedure: SMALL BOWEL RESECTION;  Surgeon: Curvin Deward MOULD, MD;  Location: [facility] OR;  Service: General;  Laterality: N/A;  . HERNIA REPAIR     umbilical as a child  . INSERTION OF MESH N/A 06/08/2023   Procedure: INSERTION OF MESH;  Surgeon: Curvin Deward MOULD, MD;  Location: [facility] OR;  Service: General;  Laterality: N/A;  . IRRIGATION AND DEBRIDEMENT SEBACEOUS CYST     on scalp  . KNEE ARTHROSCOPY Bilateral    x2  . LAPAROSCOPY N/A 05/24/2021   Procedure: LAPAROSCOPY DIAGNOSTIC;  Surgeon: Curvin Deward MOULD, MD;  Location: [facility] OR;  Service: General;  Laterality: N/A;  . LAPAROTOMY N/A 05/24/2021   Procedure: EXPLORATORY LAPAROTOMY;  Surgeon: Curvin Deward MOULD, MD;  Location: [facility] OR;  Service: General;  Laterality: N/A;  . LUMBAR FUSION    . POSTERIOR FUSION CERVICAL SPINE    . TONSILLECTOMY    . VENTRAL HERNIA REPAIR N/A 06/08/2023   Procedure: LAPAROSCOPIC ASSISTED VENTRAL HERNIA REPAIR WITH MESH;  Surgeon: Curvin Deward MOULD, MD;  Location: [facility] OR;  Service: General;  Laterality: N/A;    Social History   Occupational History  . Occupation: Disabled  Tobacco Use  . Smoking status: Former    Current packs/day: 0.00    Average packs/day: 0.5 packs/day for 6.0 years (3.0 ttl pk-yrs)    Types: Cigarettes    Start date: 2007    Quit date: 2013    Years since quitting: 12.7  .  Smokeless tobacco: Never  Vaping Use  . Vaping status: Never Used  Substance and Sexual Activity  . Alcohol use: Yes    Comment: rarely  . Drug use: No  . Sexual activity: Not on file

## 2024-05-09 NOTE — Progress Notes (Unsigned)
 Cardiology Clinic Note   Patient Name: Ricky [name] Date of Encounter: 05/10/2024  Primary Care Provider:  Wilkie Majel RAMAN, FNP Primary Cardiologist:  Lonni LITTIE Nanas, MD  Patient Profile    Ricky [name] 53 year old male presents to the clinic today for follow-up evaluation of his coronary artery disease and PVCs.  Past Medical History    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   Eval Dr Oneil Carwin, Neurosurgery 10/13   Chest pain    Depression    High cholesterol    History of kidney stones    Hypertension    Osteoarthritis    Osteoarthritis    Overweight    PONV (postoperative nausea and vomiting)    Pre-diabetes    Sleep apnea    SOB (shortness of breath)    Thrombocytopenia 07/07/2012   138,000 08/26/11; 98,000 06/29/12   Past Surgical History:  Procedure Laterality Date   ADENOIDECTOMY     ANTERIOR FUSION CERVICAL SPINE     APPENDECTOMY     BOWEL RESECTION N/A 05/24/2021   Procedure: SMALL BOWEL RESECTION;  Surgeon: Curvin Deward MOULD, MD;  Location: [facility] OR;  Service: General;  Laterality: N/A;   HERNIA REPAIR     umbilical as a child   INSERTION OF MESH N/A 06/08/2023   Procedure: INSERTION OF MESH;  Surgeon: Curvin Deward MOULD, MD;  Location: [facility] OR;  Service: General;  Laterality: N/A;   IRRIGATION AND DEBRIDEMENT SEBACEOUS CYST     on scalp   KNEE ARTHROSCOPY Bilateral    x2   LAPAROSCOPY N/A 05/24/2021   Procedure: LAPAROSCOPY DIAGNOSTIC;  Surgeon: Curvin Deward MOULD, MD;  Location: [facility] OR;  Service: General;  Laterality: N/A;   LAPAROTOMY N/A 05/24/2021   Procedure: EXPLORATORY LAPAROTOMY;  Surgeon: Curvin Deward MOULD, MD;  Location: [facility] OR;  Service: General;  Laterality: N/A;   LUMBAR FUSION     POSTERIOR FUSION CERVICAL SPINE     TONSILLECTOMY     VENTRAL HERNIA REPAIR N/A 06/08/2023   Procedure: LAPAROSCOPIC ASSISTED VENTRAL HERNIA REPAIR WITH MESH;  Surgeon: Curvin Deward MOULD, MD;   Location: [facility] OR;  Service: General;  Laterality: N/A;    Allergies  No Known Allergies  History of Present Illness    Ricky [name] has a PMH of frequent PVCs, thoracic aortic aneurysm, OSA, HTN, CAD, and thrombocytopenia.  He was initially seen and evaluated for his PVCs 12/20.  His echocardiogram 12/20 showed an LVEF of 50-55%, septal hypokinesis, and ascending aortic aneurysm measuring 48 mm.  CTA chest showed ascending aortic aneurysm measuring 47 mm 12/20.  He wore a cardiac monitor which showed frequent PVCs around 23% and 120 episodes of SVT which would last up to a minute.  Nuclear stress testing 12/20 showed no evidence of ischemia.  He was seen and evaluated by Dr. Waddell who recommended up titration of his beta-blocker and continued monitoring.  His carvedilol  was increased to 25 mg twice daily.  He wore a repeat monitor 3/21 which showed significant improvement in his PVC burden (5.3%.  Coronary CTA 4/21 showed coronary calcium  score of 19 with minimal nonobstructive CAD in his mid LAD.  Cardiac MRI 7/22 showed no LGE, EF 53%, dilated aortic root measuring 47 mm and normal RV function.  He followed up with Dr. Nanas 4/24.  He was stable from a cardiac standpoint.  He did report muscle cramps in  his arms and legs.  His statin had been reduced to every other day and his symptoms resolved.  9/24 he presented to the emergency department with postprandial periumbilical abdominal tenderness.  CT angio showed supraumbilical hernia without strangulation or obstruction.  No evidence of peritonitis or incarcerated/strangulated hernia.  He was seen in follow-up by Katlyn West NP on 05/26/2023.  He noted increased fatigue and anxiety.  His niece had passed away the previous night.  He was planning to have hernia surgery in 1 month.  His blood pressure was initially elevated at 126/90.  He denied symptoms.  He noted that occasionally his blood pressure would run low.  He noted this especially  when using his CPAP.  He had stopped using his CPAP over the prior month.  He was working on weight loss.  He had lost around 20 pounds over the previous month with using the Mediterranean diet.  He was seen in the emergency department 04/29/2024 with chest tightness and knee pain.  His cardiac workup was unremarkable.  He was noted to have a Baker's cyst.  He was ruled out for DVT.  He presents to the clinic today for follow-up evaluation and states he feels well today.  He has not had any further chest tightness.  He reports that this was related to the anxiety he was having before he was evaluated in the emergency department.  We reviewed his emergency department visit.  He expressed understanding.  He reports that he did have coffee with creamer and sugar this morning.  I recommended that he have his fasting lipids and LFTs checked with his PCP this afternoon.  I reiterated he will need to fast until his blood draw.  He expressed understanding.  We also reviewed his chest CT which shows slightly enlarged aneurysm.  He notes that he has follow-up with [facility] in November.  His blood pressure has been well-controlled.  I will give him a list of fluoroquinolone antibiotics to avoid.  Today he denies chest pain, shortness of breath, lower extremity edema,  palpitations.  Home Medications    Prior to Admission medications   Medication Sig Start Date End Date Taking? Authorizing Provider  acetaminophen  (TYLENOL ) 500 MG tablet Take 1 tablet (500 mg total) by mouth every 6 (six) hours as needed. 02/10/23   Charlyn Sora, MD  atorvastatin  (LIPITOR) 40 MG tablet Take 1 tablet (40 mg total) by mouth daily. 12/01/22 11/26/23  Kate Lonni CROME, MD  Blood Pressure Monitoring (BLOOD PRESSURE CUFF) MISC XL Adult blood pressure cuff 08/18/19   Kate Lonni CROME, MD  carvedilol  (COREG ) 25 MG tablet TAKE 1 TABLET(25 MG) BY MOUTH TWICE DAILY WITH A MEAL 11/04/21   Kate Lonni CROME, MD  cyclobenzaprine   (FLEXERIL ) 10 MG tablet Take 10 mg by mouth at bedtime as needed for muscle spasms. 05/29/21   [provider]  EDARBYCLOR 40-25 MG TABS TAKE 1 TABLET DAILY (DOSE INCREASE) 12/23/22   Kate Lonni CROME, MD  hydrOXYzine  (VISTARIL ) 50 MG capsule Take 50 mg by mouth at bedtime as needed (sleep).    [provider]  ibuprofen (ADVIL) 800 MG tablet Take 800 mg by mouth every 8 (eight) hours as needed for moderate pain.    [provider]  KLOR-CON  M20 20 MEQ tablet TAKE 1 TABLET DAILY 11/10/23   Kate Lonni CROME, MD  meloxicam  (MOBIC ) 7.5 MG tablet Take 1 tablet (7.5 mg total) by mouth daily. 05/06/24   Persons, Ronal Dragon, [place]  NOREL AD 4-10-325  MG TABS Take 1 tablet by mouth daily. 05/29/23   [provider]  ondansetron  (ZOFRAN -ODT) 4 MG disintegrating tablet Take 1 tablet (4 mg total) by mouth every 8 (eight) hours as needed for nausea or vomiting. 11/06/21   Jerrol Agent, MD  oxyCODONE  (ROXICODONE ) 5 MG immediate release tablet Take 1 tablet (5 mg total) by mouth every 6 (six) hours as needed for severe pain (pain score 7-10). 06/09/23   Curvin Mt III, MD  polyethylene glycol powder (GLYCOLAX /MIRALAX ) 17 GM/SCOOP powder Take 17 g by mouth daily. Patient taking differently: Take 17 g by mouth daily as needed for moderate constipation. 04/23/23   Dameron, Marisa, DO  rosuvastatin  (CRESTOR ) 10 MG tablet Take 10 mg by mouth at bedtime. 02/01/21   [provider]  senna-docusate (SENOKOT-S) 8.6-50 MG tablet Take 1 tablet by mouth daily. 04/23/23   Dameron, Marisa, DO  sildenafil  (VIAGRA ) 100 MG tablet TAKE 1/2 TO 1 TABLET BY MOUTH AS NEEDED. Defer further refills to PCP. 04/04/21   Kate Lonni CROME, MD    Family History    Family History  Problem Relation Age of Onset   Diabetes Mother    Hypertension Mother    Hyperlipidemia Mother    Stroke Mother    Anxiety disorder Mother    Obesity Mother    Hypertension Father    Hyperlipidemia Father     Colon cancer Neg Hx    Colon polyps Neg Hx    Esophageal cancer Neg Hx    Liver cancer Neg Hx    He indicated that the status of his mother is unknown. He indicated that the status of his father is unknown. He indicated that the status of his neg hx is unknown.  Social History    Social History   Socioeconomic History   Marital status: Single    Spouse name: Not on file   Number of children: Not on file   Years of education: Not on file   Highest education level: Not on file  Occupational History   Occupation: Disabled  Tobacco Use   Smoking status: Former    Current packs/day: 0.00    Average packs/day: 0.5 packs/day for 6.0 years (3.0 ttl pk-yrs)    Types: Cigarettes    Start date: 2007    Quit date: 2013    Years since quitting: 12.7   Smokeless tobacco: Never  Vaping Use   Vaping status: Never Used  Substance and Sexual Activity   Alcohol use: Yes    Comment: rarely   Drug use: No   Sexual activity: Not on file  Other Topics Concern   Not on file  Social History Narrative   Not on file   Social Drivers of Health   Financial Resource Strain: Not on file  Food Insecurity: Not on file  Transportation Needs: Not on file  Physical Activity: Not on file  Stress: Not on file  Social Connections: Unknown (05/27/2022)   Received from [facility]   Social Network    Social Network: Not on file  Intimate Partner Violence: Unknown (05/27/2022)   Received from [facility]   HITS    Physically Hurt: Not on file    Insult or Talk Down To: Not on file    Threaten Physical Harm: Not on file    Scream or Curse: Not on file     Review of Systems    General:  No chills, fever, night sweats or weight changes.  Cardiovascular:  No chest  pain, dyspnea on exertion, edema, orthopnea, palpitations, paroxysmal nocturnal dyspnea. Dermatological: No rash, lesions/masses Respiratory: No cough, dyspnea Urologic: No hematuria, dysuria Abdominal:   No nausea, vomiting,  diarrhea, bright red blood per rectum, melena, or hematemesis Neurologic:  No visual changes, wkns, changes in mental status. All other systems reviewed and are otherwise negative except as noted above.  Physical Exam    VS:  BP 120/84 (BP Location: Left Arm, Patient Position: Sitting)   Pulse 60   Ht 6' (1.829 m)   Wt 272 lb 6.4 oz (123.6 kg)   SpO2 98%   BMI 36.94 kg/m  , BMI Body mass index is 36.94 kg/m. GEN: Well nourished, well developed, in no acute distress. HEENT: normal. Neck: Supple, no JVD, carotid bruits, or masses. Cardiac: RRR, no murmurs, rubs, or gallops. No clubbing, cyanosis, edema.  Radials/DP/PT 2+ and equal bilaterally.  Respiratory:  Respirations regular and unlabored, clear to auscultation bilaterally. GI: Soft, nontender, nondistended, BS + x 4. MS: no deformity or atrophy. Skin: warm and dry, no rash. Neuro:  Strength and sensation are intact. Psych: Normal affect.  Accessory Clinical Findings    Recent Labs: 04/29/2024: BUN 16; Creatinine, Ser 0.89; Hemoglobin 13.7; Platelets 142; Potassium 3.7; Sodium 137   Recent Lipid Panel    Component Value Date/Time   CHOL 210 (H) 11/28/2022 1516   TRIG 197 (H) 11/28/2022 1516   HDL 33 (L) 11/28/2022 1516   CHOLHDL 6.4 (H) 11/28/2022 1516   LDLCALC 141 (H) 11/28/2022 1516         ECG personally reviewed by me today-none today.     Nuclear stress test 12/20  The study is normal. This is a low risk study.   Normal pharmacologic nuclear stress test with no evidence for prior infarct or ischemia. LVEF not calculated. Frequent PVCs.  Echocardiogram 07/28/2019  IMPRESSIONS     1. Left ventricular ejection fraction, by visual estimation, is 50 to  55%. The left ventricle has low normal function. There is mildly increased  left ventricular hypertrophy.   2. Mildly dilated left ventricular internal cavity size.   3. The left ventricle demonstrates regional wall motion abnormalities.  Septal  hypokinesis   4. Global right ventricle has normal systolic function.The right  ventricular size is normal. No increase in right ventricular wall  thickness.   5. Left atrial size was normal.   6. Right atrial size was normal.   7. The mitral valve is normal in structure. No evidence of mitral valve  regurgitation.   8. The tricuspid valve is normal in structure. Tricuspid valve  regurgitation is not demonstrated.   9. The aortic valve is tricuspid. Aortic valve regurgitation is not  visualized. No evidence of aortic valve sclerosis or stenosis.  10. The inferior vena cava is normal in size with greater than 50%  respiratory variability, suggesting right atrial pressure of 3 mmHg.  11. Aneurysm of the ascending aorta, measuring 48 mm.   FINDINGS   Left Ventricle: Left ventricular ejection fraction, by visual estimation,  is 50 to 55%. The left ventricle has low normal function. The left  ventricle demonstrates regional wall motion abnormalities. The left  ventricular internal cavity size was mildly  dilated left ventricle. There is mildly increased left ventricular  hypertrophy. Concentric left ventricular hypertrophy. Left ventricular  diastolic parameters were normal.   Right Ventricle: The right ventricular size is normal. No increase in  right ventricular wall thickness. Global RV systolic function is has  normal systolic  function.   Left Atrium: Left atrial size was normal in size.   Right Atrium: Right atrial size was normal in size   Pericardium: There is no evidence of pericardial effusion.   Mitral Valve: The mitral valve is normal in structure. No evidence of  mitral valve regurgitation.   Tricuspid Valve: The tricuspid valve is normal in structure. Tricuspid  valve regurgitation is not demonstrated.   Aortic Valve: The aortic valve is tricuspid. Aortic valve regurgitation is  not visualized. The aortic valve is structurally normal, with no evidence  of sclerosis  or stenosis.   Pulmonic Valve: The pulmonic valve was not well visualized. Pulmonic valve  regurgitation is trivial. Pulmonic regurgitation is trivial.   Aorta: Aortic dilatation noted. There is an aneurysm involving the  ascending aorta. The aneurysm measures 48 mm.   Venous: The inferior vena cava is normal in size with greater than 50%  respiratory variability, suggesting right atrial pressure of 3 mmHg.   IAS/Shunts: The atrial septum is grossly normal.   Coronary CTA 12/08/2019  FINDINGS: A retrospective scan was triggered in the descending thoracic aorta. Axial non-contrast 3 mm slices were carried out through the heart. The data set was analyzed on a dedicated work station and scored using the Agatson method. Gantry rotation speed was 330 msecs and collimation was .6 mm. 25mg  of coreg  and 0.8 mg of sl NTG was given. The 3D data set was reconstructed in 5% intervals of the 5-95 % of the R-R cycle. Diastolic phases were analyzed on a dedicated work station using MPR, MIP and VRT modes. The patient received 200 cc of contrast (patient also getting ct angio chest separate study).   Aorta: Moderate dilation of ascending aorta, measuring 47mm at the level of the R. pulmonary artery. No calcifications. No dissection.   Aortic Valve:  Trileaflet.  No calcifications.   Coronary Arteries:  Normal coronary origin.  Right dominance.   RCA is a large dominant artery that gives rise to PDA and PLA. There is no plaque.   Left main is a large artery that gives rise to LAD and LCX arteries.   LAD is a large vessel that has mild calcification in the mid segment causing minimal stenosis (0-24%).   LCX is a non-dominant artery that gives rise to one large OM1 branch. There is no plaque.   Other findings:   Normal pulmonary vein drainage into the left atrium.   Normal left atrial appendage without a thrombus.   Normal size of the pulmonary artery.   IMPRESSION: 1. Coronary  calcium  score of 18.9. This was 84th percentile for age and sex matched control.   2. Normal coronary origin with right dominance.   3. Moderately dilated ascending aorta measuring 47mm in diameter at the level of the Right Pulmonary artery.   4.  Minimal non obstructive coronary calcification in the mid LAD   5. CAD-RADS 1. Minimal non-obstructive CAD (0-24%). Consider non-atherosclerotic causes of chest pain. Consider preventive therapy and risk factor modification.   Electronically Signed: By: Redell Cave M.D. On: 12/08/2019 13:30    Assessment & Plan   1.  Ascending aortic aneurysm-CT angio chest aorta 03/05/2024 showed aneurysm measuring 5 cm. Avoid fluoroquinolone antibiotics Maintain good blood pressure control Avoid straining Refer to CVTS Plan for repeat CT angio chest every 6 months for evaluation/screening  Essential hypertension-BP today 120/84. Maintain blood pressure log Heart healthy low-sodium diet Continue current medical therapy  PVCs-denies episodes of accelerated or irregular heartbeat.  Previously noted to have 23% PVC burden.  Cardiac MRI 7/22 showed no LGE and EF 53%. Avoid triggers caffeine, chocolate, EtOH, dehydration excetra. Continue carvedilol   Hyperlipidemia-LDL 141 on 11/28/22. Continue rosuvastatin  High-fiber diet Follows with pcp  Coronary artery disease-no chest pain today.  Coronary CTA 4/21 showed 0-24% mid LAD stenosis and no other plaque.  Coronary calcium  score was noted to be 18.9. Heart healthy low-sodium high-fiber diet Increase physical activity as tolerated-avoid straining  Disposition: Follow-up with Dr. Kate or me in 6 months.   Josefa HERO. Gardner Servantes NP-C     05/10/2024, 8:54 AM [facility] [address] [place], [place] [address] Office [phone]    Notice: This dictation was prepared with Dragon dictation along with smaller phrase technology. Any transcriptional  errors that result from this process are unintentional and may not be corrected upon review.   I spent 14 minutes examining this patient, reviewing medications, and using patient centered shared decision making involving their cardiac care.   I spent  20 minutes reviewing past medical history,  medications, and prior cardiac tests.

## 2024-05-10 ENCOUNTER — Encounter: Payer: Self-pay | Admitting: General Practice

## 2024-05-10 ENCOUNTER — Ambulatory Visit: Attending: General Practice | Admitting: General Practice

## 2024-05-10 VITALS — BP 120/84 | HR 60 | Ht 72.0 in | Wt 272.4 lb

## 2024-05-10 DIAGNOSIS — I10 Essential (primary) hypertension: Secondary | ICD-10-CM | POA: Diagnosis not present

## 2024-05-10 DIAGNOSIS — I7121 Aneurysm of the ascending aorta, without rupture: Secondary | ICD-10-CM

## 2024-05-10 DIAGNOSIS — I2510 Atherosclerotic heart disease of native coronary artery without angina pectoris: Secondary | ICD-10-CM

## 2024-05-10 DIAGNOSIS — I493 Ventricular premature depolarization: Secondary | ICD-10-CM

## 2024-05-10 DIAGNOSIS — E785 Hyperlipidemia, unspecified: Secondary | ICD-10-CM

## 2024-05-10 NOTE — Patient Instructions (Signed)
 Medication Instructions:  Your physician recommends that you continue on your current medications as directed. Please refer to the Current Medication list given to you today.  *If you need a refill on your cardiac medications before your next appointment, please call your pharmacy*  Lab Work: None ordered here today, continue to fast until you see PCP this afternoon so they can check your labs.  If you have labs (blood work) drawn today and your tests are completely normal, you will receive your results only by: MyChart Message (if you have MyChart) OR A paper copy in the mail If you have any lab test that is abnormal or we need to change your treatment, we will call you to review the results.  Testing/Procedures: None ordered  Follow-Up: At [facility], you and your health needs are our priority.  As part of our continuing mission to provide you with exceptional heart care, our providers are all part of one team.  This team includes your primary Cardiologist (physician) and Advanced Practice Providers or APPs (Physician Assistants and Nurse Practitioners) who all work together to provide you with the care you need, when you need it.  Your next appointment:   6 month(s)  Provider:   Lonni LITTIE Nanas, MD or Josefa Beauvais, NP          We recommend signing up for the patient portal called MyChart.  Sign up information is provided on this After Visit Summary.  MyChart is used to connect with patients for Virtual Visits (Telemedicine).  Patients are able to view lab/test results, encounter notes, upcoming appointments, etc.  Non-urgent messages can be sent to your provider as well.   To learn more about what you can do with MyChart, go to [email or web address].   Other Instructions

## 2024-06-06 ENCOUNTER — Other Ambulatory Visit: Payer: Self-pay

## 2024-06-06 ENCOUNTER — Emergency Department (HOSPITAL_BASED_OUTPATIENT_CLINIC_OR_DEPARTMENT_OTHER)

## 2024-06-06 ENCOUNTER — Encounter (HOSPITAL_BASED_OUTPATIENT_CLINIC_OR_DEPARTMENT_OTHER): Payer: Self-pay

## 2024-06-06 ENCOUNTER — Emergency Department (HOSPITAL_BASED_OUTPATIENT_CLINIC_OR_DEPARTMENT_OTHER)
Admission: EM | Admit: 2024-06-06 | Discharge: 2024-06-06 | Disposition: A | Discharge status: Home / Self Care | Attending: Emergency Medicine | Admitting: Emergency Medicine

## 2024-06-06 DIAGNOSIS — E876 Hypokalemia: Secondary | ICD-10-CM | POA: Insufficient documentation

## 2024-06-06 DIAGNOSIS — K5900 Constipation, unspecified: Secondary | ICD-10-CM | POA: Diagnosis present

## 2024-06-06 LAB — URINALYSIS, ROUTINE W REFLEX MICROSCOPIC
Bilirubin Urine: NEGATIVE
Glucose, UA: NEGATIVE mg/dL
Hgb urine dipstick: NEGATIVE
Ketones, ur: NEGATIVE mg/dL
Leukocytes,Ua: NEGATIVE
Nitrite: NEGATIVE
Specific Gravity, Urine: >1.046 — ABNORMAL HIGH (ref 1.005–1.030)
pH: 8 (ref 5.0–8.0)

## 2024-06-06 LAB — COMPREHENSIVE METABOLIC PANEL WITH GFR
ALT: 19 U/L (ref 0–44)
AST: 26 U/L (ref 15–41)
Albumin: 4.5 g/dL (ref 3.5–5.0)
Alkaline Phosphatase: 96 U/L (ref 38–126)
Anion gap: 12 (ref 5–15)
BUN: 9 mg/dL (ref 6–20)
CO2: 27 mmol/L (ref 22–32)
Calcium: 9.8 mg/dL (ref 8.9–10.3)
Chloride: 98 mmol/L (ref 98–111)
Creatinine, Ser: 0.9 mg/dL (ref 0.61–1.24)
GFR, Estimated: >60 mL/min (ref >60)
Glucose, Bld: 119 mg/dL — ABNORMAL HIGH (ref 70–99)
Potassium: 3.3 mmol/L — ABNORMAL LOW (ref 3.5–5.1)
Sodium: 138 mmol/L (ref 135–145)
Total Bilirubin: 0.7 mg/dL (ref 0.0–1.2)
Total Protein: 8.1 g/dL (ref 6.5–8.1)

## 2024-06-06 LAB — LIPASE, BLOOD: Lipase: 24 U/L (ref 11–51)

## 2024-06-06 LAB — CBC
HCT: 45.3 % (ref 39.0–52.0)
Hemoglobin: 15 g/dL (ref 13.0–17.0)
MCH: 29.1 pg (ref 26.0–34.0)
MCHC: 33.1 g/dL (ref 30.0–36.0)
MCV: 87.8 fL (ref 80.0–100.0)
Platelets: 164 K/uL (ref 150–400)
RBC: 5.16 MIL/uL (ref 4.22–5.81)
RDW: 13.3 % (ref 11.5–15.5)
WBC: 6.4 K/uL (ref 4.0–10.5)
nRBC: 0 % (ref 0.0–0.2)

## 2024-06-06 LAB — MAGNESIUM: Magnesium: 1.9 mg/dL (ref 1.7–2.4)

## 2024-06-06 MED ORDER — POTASSIUM CHLORIDE CRYS ER 20 MEQ PO TBCR
20.0000 meq | EXTENDED_RELEASE_TABLET | Freq: Once | ORAL | Status: AC
Start: 2024-06-06 — End: 2024-06-06
  Administered 2024-06-06: 20 meq via ORAL
  Filled 2024-06-06: qty 1

## 2024-06-06 MED ORDER — IOHEXOL 300 MG/ML  SOLN
100.0000 mL | Freq: Once | INTRAMUSCULAR | Status: AC | PRN
Start: 2024-06-06 — End: 2024-06-06
  Administered 2024-06-06: 100 mL via INTRAVENOUS

## 2024-06-06 MED ORDER — GLYCERIN (LAXATIVE) 2.1 G RE SUPP: 1.0000 | Freq: Once | RECTAL | Status: DC

## 2024-06-06 NOTE — ED Provider Notes (Signed)
  EMERGENCY DEPARTMENT AT [facility] Provider Note   CSN: [id number] Arrival date & time: 06/06/24  9283     Patient presents with: Constipation   Ricky [name] is a 53 y.o. male.    Constipation    53 year old male with medical history significant for bowel obstruction and volvulus status post diagnostic laparotomy and bowel resection with Dr. Curvin, ventral hernia repair and insertion of mesh who presents to the emergency department with concern for bowel obstruction.  The patient states that he has not had a bowel movement for the past 2 days.  He had not been passing gas either.  He was concern for repeat bowel obstruction.  He denies any abdominal pain, nausea or vomiting at this time.  No fevers or chills.  He states that he finally did pass gas once since he arrived to the emergency department.  He still endorses ongoing concern given his medical history.  He denies any rectal pain or pressure.  Prior to Admission medications   Medication Sig Start Date End Date Taking? Authorizing Provider  acetaminophen  (TYLENOL ) 500 MG tablet Take 1 tablet (500 mg total) by mouth every 6 (six) hours as needed. 02/10/23   Charlyn Sora, MD  atorvastatin  (LIPITOR) 40 MG tablet Take 1 tablet (40 mg total) by mouth daily. 12/01/22 05/10/24  Kate Lonni LITTIE, MD  Blood Pressure Monitoring (BLOOD PRESSURE CUFF) MISC XL Adult blood pressure cuff 08/18/19   Kate Lonni LITTIE, MD  carvedilol  (COREG ) 25 MG tablet TAKE 1 TABLET(25 MG) BY MOUTH TWICE DAILY WITH A MEAL 11/04/21   Kate Lonni LITTIE, MD  cyclobenzaprine  (FLEXERIL ) 10 MG tablet Take 10 mg by mouth at bedtime as needed for muscle spasms. 05/29/21   [provider]  EDARBYCLOR 40-25 MG TABS TAKE 1 TABLET DAILY (DOSE INCREASE) 12/23/22   Kate Lonni LITTIE, MD  hydrOXYzine  (VISTARIL ) 50 MG capsule Take 50 mg by mouth at bedtime as needed (sleep).    [provider]  ibuprofen (ADVIL) 800  MG tablet Take 800 mg by mouth every 8 (eight) hours as needed for moderate pain.    [provider]  KLOR-CON  M20 20 MEQ tablet TAKE 1 TABLET DAILY 11/10/23   Kate Lonni LITTIE, MD  meloxicam  (MOBIC ) 7.5 MG tablet Take 1 tablet (7.5 mg total) by mouth daily. 05/06/24   Persons, Ronal Dragon, PA  NOREL AD 4-10-325 MG TABS Take 1 tablet by mouth daily. 05/29/23   [provider]  ondansetron  (ZOFRAN -ODT) 4 MG disintegrating tablet Take 1 tablet (4 mg total) by mouth every 8 (eight) hours as needed for nausea or vomiting. 11/06/21   Jerrol Lynwood, MD  oxyCODONE  (ROXICODONE ) 5 MG immediate release tablet Take 1 tablet (5 mg total) by mouth every 6 (six) hours as needed for severe pain (pain score 7-10). 06/09/23   Curvin Mt III, MD  polyethylene glycol powder (GLYCOLAX /MIRALAX ) 17 GM/SCOOP powder Take 17 g by mouth daily. Patient taking differently: Take 17 g by mouth daily as needed for moderate constipation. 04/23/23   Dameron, Marisa, DO  rosuvastatin  (CRESTOR ) 10 MG tablet Take 10 mg by mouth at bedtime. 02/01/21   [provider]  senna-docusate (SENOKOT-S) 8.6-50 MG tablet Take 1 tablet by mouth daily. 04/23/23   Dameron, Marisa, DO  sildenafil  (VIAGRA ) 100 MG tablet TAKE 1/2 TO 1 TABLET BY MOUTH AS NEEDED. Defer further refills to PCP. 04/04/21   Kate Lonni LITTIE, MD    Allergies: Patient has no known allergies.  Review of Systems  Gastrointestinal:  Positive for constipation.  All other systems reviewed and are negative.   Updated Vital Signs BP 101/66   Pulse 66   Temp 98.6 F (37 C) (Oral)   Resp 18   Ht 6' (1.829 m)   Wt 124.3 kg   SpO2 99%   BMI 37.16 kg/m   Physical Exam Vitals and nursing note reviewed. Exam conducted with a chaperone present.  Constitutional:      General: He is not in acute distress.    Appearance: He is well-developed.  HENT:     Head: Normocephalic and atraumatic.  Eyes:     Conjunctiva/sclera: Conjunctivae  normal.  Cardiovascular:     Rate and Rhythm: Normal rate and regular rhythm.     Heart sounds: No murmur heard. Pulmonary:     Effort: Pulmonary effort is normal. No respiratory distress.     Breath sounds: Normal breath sounds.  Abdominal:     Palpations: Abdomen is soft.     Tenderness: There is no abdominal tenderness.  Genitourinary:    Comments: Rectal exam performed, no melena or hematochezia, no impaction noted Musculoskeletal:        General: No swelling.     Cervical back: Neck supple.  Skin:    General: Skin is warm and dry.     Capillary Refill: Capillary refill takes less than 2 seconds.  Neurological:     Mental Status: He is alert.  Psychiatric:        Mood and Affect: Mood normal.     (all labs ordered are listed, but only abnormal results are displayed) Labs Reviewed  COMPREHENSIVE METABOLIC PANEL WITH GFR - Abnormal; Notable for the following components:      Result Value   Potassium 3.3 (*)    Glucose, Bld 119 (*)    All other components within normal limits  URINALYSIS, ROUTINE W REFLEX MICROSCOPIC - Abnormal; Notable for the following components:   Specific Gravity, Urine >1.046 (*)    Protein, ur TRACE (*)    All other components within normal limits  LIPASE, BLOOD  CBC  MAGNESIUM     EKG: None  Radiology: CT ABDOMEN PELVIS W CONTRAST Result Date: 06/06/2024 CLINICAL DATA:  Constipation for 2 days. EXAM: CT ABDOMEN AND PELVIS WITH CONTRAST TECHNIQUE: Multidetector CT imaging of the abdomen and pelvis was performed using the standard protocol following bolus administration of intravenous contrast. RADIATION DOSE REDUCTION: This exam was performed according to the departmental dose-optimization program which includes automated exposure control, adjustment of the mA and/or kV according to patient size and/or use of iterative reconstruction technique. CONTRAST:  OMNIPAQUE  IOHEXOL  300 MG/ML  SOLN COMPARISON:  None Available. FINDINGS: Lower chest:  No acute abnormality. Hepatobiliary: No focal liver abnormality is seen. No gallstones, gallbladder wall thickening, or biliary dilatation. Pancreas: Unremarkable. No pancreatic ductal dilatation or surrounding inflammatory changes. Spleen: Normal in size without focal abnormality. Adrenals/Urinary Tract: Adrenal glands are unremarkable. Kidneys are normal, without renal calculi, focal lesion, or hydronephrosis. Bladder is unremarkable. Stomach/Bowel: Stomach is within normal limits. The appendix is surgically absent. A very mild stool burden is noted. No evidence of bowel wall thickening, distention, or inflammatory changes. Noninflamed diverticula are noted within the distal descending colon. Vascular/Lymphatic: No significant vascular findings are present. No enlarged abdominal or pelvic lymph nodes. Reproductive: Prostate is unremarkable. Other: No abdominal wall hernia or abnormality. No abdominopelvic ascites. Musculoskeletal: Postoperative changes are seen within the lower lumbar spine. No acute osseous abnormality  is identified IMPRESSION: 1. No acute or active process within the abdomen or pelvis. 2. Colonic diverticulosis. 3. Postoperative changes within the lower lumbar spine. Electronically Signed   By: Suzen Dials M.D.   On: 06/06/2024 10:21   DG Abdomen 1 View Result Date: 06/06/2024 EXAM: 1 VIEW XRAY OF THE ABDOMEN 06/06/2024 07:47:34 AM COMPARISON: Abdominal series dated 05/09/2021. CLINICAL HISTORY: Constipation. Triage notes: Presents to ED with c/o constipation for two days. No meds PTA. Denies pain at this time. FINDINGS: BOWEL: Nonobstructive bowel gas pattern. SOFT TISSUES: No opaque urinary calculi. BONES: Posterior fixation of lower lumbar spine noted. No acute osseous abnormality. IMPRESSION: 1. No bowel obstruction. Electronically signed by: Evalene Coho MD 06/06/2024 07:59 AM EDT RP Workstation: [id number]     Procedures   Medications Ordered in the ED  potassium  chloride SA (KLOR-CON  M) CR tablet 20 mEq (20 mEq Oral Given 06/06/24 0842)  iohexol  (OMNIPAQUE ) 300 MG/ML solution 100 mL (100 mLs Intravenous Contrast Given 06/06/24 0910)                                    Medical Decision Making Amount and/or Complexity of Data Reviewed Labs: ordered. Radiology: ordered.  Risk OTC drugs. Prescription drug management.    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 Curvin, ventral hernia repair and insertion of mesh who presents to the emergency department with concern for bowel obstruction.  The patient states that he has not had a bowel movement for the past 2 days.  He had not been passing gas either.  He was concern for repeat bowel obstruction.  He denies any abdominal pain, nausea or vomiting at this time.  No fevers or chills.  He states that he finally did pass gas once since he arrived to the emergency department.  He still endorses ongoing concern given his medical history.  He denies any rectal pain or pressure.  On arrival, the patient was afebrile, temperature nine 9.1, not tachycardic or tachypneic, BP 142/121, saturating 99% on room air.  Physical exam generally unremarkable.  Considered constipation versus developing ileus or small bowel obstruction given the patient's history.  Given his history, will obtain CT imaging to further evaluate in addition to screening laboratory evaluation.  Labs: CMP with mild hypokalemia to 3.3, replenished orally, CBC without a leukocytosis or anemia, magnesium  low normal at 1.9, lipase normal, urinalysis collected and resulted negative.  Rectal exam without fecal impaction.  CT Abd Pel: IMPRESSION:  1. No acute or active process within the abdomen or pelvis.  2. Colonic diverticulosis.  3. Postoperative changes within the lower lumbar spine.   Pt sx consistent with constipation.  Overall reassuring  workup at this time.  Advised stool softeners and OTC Miralax  to ensure resolution.      Final diagnoses:  Constipation, unspecified constipation type    ED Discharge Orders     None          Jerrol Agent, MD 06/06/24 1207

## 2024-06-06 NOTE — Discharge Instructions (Addendum)
 Follow-up with your primary care provider.  Your CT imaging did not show evidence of a bowel obstruction. Recommend stool softeners and Miralax  OTC for continued outpatient bowel regimen

## 2024-06-06 NOTE — ED Triage Notes (Signed)
 Presents to ED with c/o constipation for two days. No meds PTA. Denies pain at this time.

## 2024-06-20 ENCOUNTER — Encounter: Payer: Self-pay | Admitting: Radiology

## 2024-06-20 ENCOUNTER — Ambulatory Visit

## 2024-06-20 DIAGNOSIS — I7121 Aneurysm of the ascending aorta, without rupture: Secondary | ICD-10-CM | POA: Diagnosis not present

## 2024-06-20 NOTE — Progress Notes (Signed)
 [address] [place] [address]             [phone]       CARDIOTHORACIC SURGERY TELEPHONE VIRTUAL OFFICE NOTE  Referring Provider is Wilkie Majel GORMAN DEWAINE Primary Cardiologist is Lonni LITTIE Nanas, MD PCP is Wilkie Majel GORMAN, FNP   HPI:  I spoke with Ricky [name] (DOB 03-19-1971 ) via telephone on 06/20/2024 at 9:44 AM and verified that I was speaking with the correct person using more than one form of identification.  We discussed the fact that I was contacting them from my office in [place] and they were located at home in [place], as well as the reason(s) for conducting our visit virtually instead of in-person.  The patient expressed understanding the circumstances and agreed to proceed as described.  Ricky [name] is a 53 year old man with medical history of hypertension, PVCs, OSA, osteoarthritis, and hyperlipidemia who presents for telephone encounter for continued follow up of ascending thoracic aortic aneurysm.  He has been followed by our clinic since 2021 and aneurysm has measured from 4.7 cm - 5.0 cm. On CTA of chest on 02/2024 aneurysm measured 5.0 cm.  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    He reports that he has been doing well.  His blood pressure is well controlled with current medication therapy.  He has a home BP cuff and checks it regularly.  Reports readings in 130s/80s.  He walks for exercise and denies heavy lifting.  He has had some knee pain due to osteoarthritis which has limited his exercise.  He denies chest pain, shortness of breath and lower leg edema.      Current Outpatient Medications  Medication Sig Dispense Refill   acetaminophen  (TYLENOL ) 500 MG tablet Take 1 tablet (500 mg total) by mouth every 6 (six) hours as needed. 30 tablet 0   atorvastatin  (LIPITOR) 40 MG tablet Take 1 tablet (40 mg total) by mouth daily. 90 tablet 3   Blood Pressure Monitoring (BLOOD PRESSURE CUFF)  MISC XL Adult blood pressure cuff 1 each 0   carvedilol  (COREG ) 25 MG tablet TAKE 1 TABLET(25 MG) BY MOUTH TWICE DAILY WITH A MEAL 180 tablet 3   cyclobenzaprine  (FLEXERIL ) 10 MG tablet Take 10 mg by mouth at bedtime as needed for muscle spasms.     EDARBYCLOR 40-25 MG TABS TAKE 1 TABLET DAILY (DOSE INCREASE) 90 tablet 3   hydrOXYzine  (VISTARIL ) 50 MG capsule Take 50 mg by mouth at bedtime as needed (sleep).     ibuprofen (ADVIL) 800 MG tablet Take 800 mg by mouth every 8 (eight) hours as needed for moderate pain.     KLOR-CON  M20 20 MEQ tablet TAKE 1 TABLET DAILY 90 tablet 3   meloxicam  (MOBIC ) 7.5 MG tablet Take 1 tablet (7.5 mg total) by mouth daily. 30 tablet 0   NOREL AD 4-10-325 MG TABS Take 1 tablet by mouth daily.     ondansetron  (ZOFRAN -ODT) 4 MG disintegrating tablet Take 1 tablet (4 mg total) by mouth every 8 (eight) hours as needed for nausea or vomiting. 20 tablet 0   oxyCODONE  (ROXICODONE ) 5 MG immediate release tablet Take 1 tablet (5 mg total) by mouth every 6 (six) hours as needed for severe pain (pain score 7-10). 15 tablet 0   polyethylene glycol powder (GLYCOLAX /MIRALAX ) 17 GM/SCOOP powder Take 17 g by mouth daily. (Patient taking differently: Take 17 g by mouth  daily as needed for moderate constipation.) 510 g 0   rosuvastatin  (CRESTOR ) 10 MG tablet Take 10 mg by mouth at bedtime.     senna-docusate (SENOKOT-S) 8.6-50 MG tablet Take 1 tablet by mouth daily. 100 tablet 0   sildenafil  (VIAGRA ) 100 MG tablet TAKE 1/2 TO 1 TABLET BY MOUTH AS NEEDED. Defer further refills to PCP. 10 tablet 0   No current facility-administered medications for this visit.   Review of Systems  Respiratory: Negative.  Negative for cough and shortness of breath.   Cardiovascular: Negative.  Negative for chest pain and leg swelling.  Musculoskeletal:  Positive for joint pain.     Diagnostic Tests:  CLINICAL DATA:  Aortic aneurysm suspected history aneurysm, pt requests ct   hest pain 6 out 10  on the center and left chest side. Pt complaints of right leg pain yesterday that felt different from chronic knee pain,   EXAM: CT ANGIOGRAPHY CHEST WITH CONTRAST   TECHNIQUE: Multidetector CT imaging of the chest was performed using the standard protocol during bolus administration of intravenous contrast. Multiplanar CT image reconstructions and MIPs were obtained to evaluate the vascular anatomy.   RADIATION DOSE REDUCTION: This exam was performed according to the departmental dose-optimization program which includes automated exposure control, adjustment of the mA and/or kV according to patient size and/or use of iterative reconstruction technique.   CONTRAST:  75mL OMNIPAQUE  IOHEXOL  350 MG/ML SOLN   COMPARISON:  None Available.   FINDINGS: Cardiovascular: Preferential opacification of the thoracic aorta. Aneurysmal ascending thoracic aorta measuring up to 5 cm. Aneurysmal aortic root measuring 4 cm. The descending thoracic aorta is normal in caliber. No evidence of thoracic aortic dissection. Normal heart size. No significant pericardial effusion. The thoracic aorta is normal in caliber. No atherosclerotic plaque of the thoracic aorta. No coronary artery calcifications. No pulmonary embolus.   Mediastinum/Nodes: No enlarged mediastinal, hilar, or axillary lymph nodes. Thyroid gland, trachea, and esophagus demonstrate no significant findings.   Lungs/Pleura: No focal consolidation. No pulmonary nodule. No pulmonary mass. No pleural effusion. No pneumothorax.   Upper Abdomen: No acute abnormality.   Musculoskeletal:   No chest wall abnormality.   No suspicious lytic or blastic osseous lesions. No acute displaced fracture. Multilevel degenerative changes of the spine.   Review of the MIP images confirms the above findings.   IMPRESSION: 1. Aneurysmal ascending thoracic aorta (5 cm). Aneurysmal aortic root (4 cm). Recommend semi-annual imaging followup by CTA or  MRA and referral to cardiothoracic surgery if not already obtained. This recommendation follows 2010 ACCF/AHA/AATS/ACR/ASA/SCA/SCAI/SIR/STS/SVM Guidelines for the Diagnosis and Management of Patients With Thoracic Aortic Disease. Circulation. 2010; 121: [id number]. Aortic aneurysm NOS (ICD10-I71.9) 2. No pulmonary embolus.     Electronically Signed   By: Morgane  Naveau M.D.   On: 03/05/2024 23:46   Plan: Aneurysm of ascending aorta without rupture -5.0 cm ascending thoracic aortic aneurysm on CTA of chest. Echocardiogram showed tricuspid aortic valve. -We discussed the natural history and and risk factors for growth of ascending aortic aneurysms. Discussed recommendations to minimize the risk of further expansion or dissection including careful blood pressure control, avoidance of contact sports and heavy lifting, attention to lipid management.  We covered the importance of continued smoking cessation.  The patient does not yet meet surgical criteria of >5.5cm. The patient is aware of signs and symptoms of aortic dissection and when to present to the emergency department   -Follow up in 6 months with CTA of chest and virtual visit for  continued surveillance      I discussed limitations of evaluation and management via telephone.  The patient was advised to call back for repeat telephone consultation or to seek an in-person evaluation if questions arise or the patient's clinical condition changes in any significant manner.  I spent in excess of 20 minutes of non-face-to-face time during the conduct of this telephone virtual office consultation, including pre-visit review of the patient's records and direct conversation with the patient.   Level 1  (99441)             5-10 minutes Level 2  (99442)            11-20 minutes Level 3  (99443)            21-30 minutes   Manuelita CHRISTELLA Rough, PA-C 06/20/2024 9:44 AM

## 2024-06-20 NOTE — Patient Instructions (Signed)

## 2024-07-25 ENCOUNTER — Other Ambulatory Visit: Payer: Self-pay | Admitting: Physician Assistant
